# Patient Record
Sex: Female | Born: 1965
Health system: Southern US, Community
[De-identification: ages and names within clinical notes are randomized; demographics above are authoritative.]

## PROBLEM LIST (undated history)

## (undated) DIAGNOSIS — Q249 Congenital malformation of heart, unspecified: Secondary | ICD-10-CM

## (undated) DIAGNOSIS — M353 Polymyalgia rheumatica: Secondary | ICD-10-CM

## (undated) DIAGNOSIS — J189 Pneumonia, unspecified organism: Secondary | ICD-10-CM

## (undated) DIAGNOSIS — E669 Obesity, unspecified: Secondary | ICD-10-CM

## (undated) DIAGNOSIS — R7303 Prediabetes: Secondary | ICD-10-CM

## (undated) DIAGNOSIS — L0291 Cutaneous abscess, unspecified: Secondary | ICD-10-CM

## (undated) DIAGNOSIS — G90A Postural orthostatic tachycardia syndrome (POTS): Secondary | ICD-10-CM

## (undated) DIAGNOSIS — D839 Common variable immunodeficiency, unspecified: Secondary | ICD-10-CM

## (undated) DIAGNOSIS — R112 Nausea with vomiting, unspecified: Secondary | ICD-10-CM

## (undated) DIAGNOSIS — K219 Gastro-esophageal reflux disease without esophagitis: Secondary | ICD-10-CM

## (undated) DIAGNOSIS — M1990 Unspecified osteoarthritis, unspecified site: Secondary | ICD-10-CM

## (undated) DIAGNOSIS — I498 Other specified cardiac arrhythmias: Secondary | ICD-10-CM

## (undated) DIAGNOSIS — K5790 Diverticulosis of intestine, part unspecified, without perforation or abscess without bleeding: Secondary | ICD-10-CM

## (undated) DIAGNOSIS — Z9889 Other specified postprocedural states: Secondary | ICD-10-CM

## (undated) DIAGNOSIS — E7800 Pure hypercholesterolemia, unspecified: Secondary | ICD-10-CM

## (undated) DIAGNOSIS — G4730 Sleep apnea, unspecified: Secondary | ICD-10-CM

## (undated) DIAGNOSIS — I4891 Unspecified atrial fibrillation: Secondary | ICD-10-CM

## (undated) DIAGNOSIS — C8590 Non-Hodgkin lymphoma, unspecified, unspecified site: Secondary | ICD-10-CM

## (undated) DIAGNOSIS — R590 Localized enlarged lymph nodes: Secondary | ICD-10-CM

## (undated) DIAGNOSIS — J45909 Unspecified asthma, uncomplicated: Secondary | ICD-10-CM

## (undated) DIAGNOSIS — E119 Type 2 diabetes mellitus without complications: Secondary | ICD-10-CM

## (undated) DIAGNOSIS — I10 Essential (primary) hypertension: Secondary | ICD-10-CM

## (undated) DIAGNOSIS — R519 Headache, unspecified: Secondary | ICD-10-CM

## (undated) DIAGNOSIS — Z8632 Personal history of gestational diabetes: Secondary | ICD-10-CM

## (undated) DIAGNOSIS — N390 Urinary tract infection, site not specified: Secondary | ICD-10-CM

## (undated) HISTORY — DX: Cutaneous abscess, unspecified: L02.91

## (undated) HISTORY — PX: COLONOSCOPY: SHX174

## (undated) HISTORY — PX: SPLENECTOMY, TOTAL: SHX788

## (undated) HISTORY — DX: Congenital malformation of heart, unspecified: Q24.9

## (undated) HISTORY — DX: Non-Hodgkin lymphoma, unspecified, unspecified site: C85.90

## (undated) HISTORY — DX: Unspecified atrial fibrillation: I48.91

## (undated) HISTORY — PX: CHOLECYSTECTOMY: SHX55

## (undated) HISTORY — DX: Unspecified osteoarthritis, unspecified site: M19.90

## (undated) HISTORY — DX: Postural orthostatic tachycardia syndrome (POTS): G90.A

## (undated) HISTORY — DX: Urinary tract infection, site not specified: N39.0

## (undated) HISTORY — DX: Pneumonia, unspecified organism: J18.9

## (undated) HISTORY — PX: GALLBLADDER SURGERY: SHX652

## (undated) HISTORY — PX: JOINT REPLACEMENT: SHX530

## (undated) HISTORY — DX: Pure hypercholesterolemia, unspecified: E78.00

## (undated) HISTORY — DX: Diverticulosis of intestine, part unspecified, without perforation or abscess without bleeding: K57.90

## (undated) HISTORY — PX: APPENDECTOMY: SHX54

## (undated) HISTORY — PX: ABDOMINAL HYSTERECTOMY: SUR658

## (undated) HISTORY — DX: Essential (primary) hypertension: I10

## (undated) HISTORY — DX: Personal history of gestational diabetes: Z86.32

## (undated) HISTORY — PX: OTHER SURGICAL HISTORY: SHX169

## (undated) HISTORY — DX: Common variable immunodeficiency, unspecified: D83.9

## (undated) HISTORY — DX: Other specified cardiac arrhythmias: I49.8

## (undated) HISTORY — PX: TONSILLECTOMY AND ADENOIDECTOMY: SUR1326

## (undated) HISTORY — DX: Localized enlarged lymph nodes: R59.0

## (undated) HISTORY — DX: Obesity, unspecified: E66.9

## (undated) HISTORY — PX: LUNG REMOVAL, PARTIAL: SHX233

---
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
## 1996-09-29 DIAGNOSIS — C8590 Non-Hodgkin lymphoma, unspecified, unspecified site: Secondary | ICD-10-CM

## 1996-09-29 HISTORY — PX: SPLENECTOMY, TOTAL: SHX788

## 1996-09-29 HISTORY — DX: Non-Hodgkin lymphoma, unspecified, unspecified site: C85.90

## 2007-09-30 HISTORY — PX: LUNG REMOVAL, PARTIAL: SHX233

## 2011-01-12 DIAGNOSIS — N61 Mastitis without abscess: Secondary | ICD-10-CM | POA: Insufficient documentation

## 2012-07-13 DIAGNOSIS — Z9049 Acquired absence of other specified parts of digestive tract: Secondary | ICD-10-CM | POA: Insufficient documentation

## 2012-12-10 DIAGNOSIS — M5416 Radiculopathy, lumbar region: Secondary | ICD-10-CM | POA: Insufficient documentation

## 2012-12-10 DIAGNOSIS — M545 Low back pain, unspecified: Secondary | ICD-10-CM | POA: Insufficient documentation

## 2012-12-10 DIAGNOSIS — M79604 Pain in right leg: Secondary | ICD-10-CM | POA: Insufficient documentation

## 2012-12-21 DIAGNOSIS — Z8572 Personal history of non-Hodgkin lymphomas: Secondary | ICD-10-CM | POA: Insufficient documentation

## 2013-01-20 DIAGNOSIS — R7303 Prediabetes: Secondary | ICD-10-CM | POA: Insufficient documentation

## 2013-01-20 DIAGNOSIS — Z9081 Acquired absence of spleen: Secondary | ICD-10-CM | POA: Insufficient documentation

## 2013-03-30 DIAGNOSIS — R0982 Postnasal drip: Secondary | ICD-10-CM | POA: Insufficient documentation

## 2013-09-29 DIAGNOSIS — D839 Common variable immunodeficiency, unspecified: Secondary | ICD-10-CM

## 2013-09-29 HISTORY — DX: Common variable immunodeficiency, unspecified: D83.9

## 2015-05-01 DIAGNOSIS — J189 Pneumonia, unspecified organism: Secondary | ICD-10-CM | POA: Insufficient documentation

## 2015-05-07 DIAGNOSIS — D839 Common variable immunodeficiency, unspecified: Secondary | ICD-10-CM | POA: Insufficient documentation

## 2015-12-12 DIAGNOSIS — I10 Essential (primary) hypertension: Secondary | ICD-10-CM | POA: Insufficient documentation

## 2015-12-28 DIAGNOSIS — Z6841 Body Mass Index (BMI) 40.0 and over, adult: Secondary | ICD-10-CM | POA: Insufficient documentation

## 2016-03-14 DIAGNOSIS — Z90710 Acquired absence of both cervix and uterus: Secondary | ICD-10-CM | POA: Insufficient documentation

## 2017-01-12 ENCOUNTER — Encounter: Payer: Self-pay | Admitting: Internal Medicine

## 2017-01-13 DIAGNOSIS — M79675 Pain in left toe(s): Secondary | ICD-10-CM | POA: Diagnosis not present

## 2017-01-20 DIAGNOSIS — Z01419 Encounter for gynecological examination (general) (routine) without abnormal findings: Secondary | ICD-10-CM | POA: Diagnosis not present

## 2017-01-20 DIAGNOSIS — Z90722 Acquired absence of ovaries, bilateral: Secondary | ICD-10-CM | POA: Diagnosis not present

## 2017-01-20 DIAGNOSIS — Z90710 Acquired absence of both cervix and uterus: Secondary | ICD-10-CM | POA: Diagnosis not present

## 2017-01-20 DIAGNOSIS — Z1231 Encounter for screening mammogram for malignant neoplasm of breast: Secondary | ICD-10-CM | POA: Diagnosis not present

## 2017-01-20 DIAGNOSIS — Z9079 Acquired absence of other genital organ(s): Secondary | ICD-10-CM | POA: Diagnosis not present

## 2017-01-20 DIAGNOSIS — I10 Essential (primary) hypertension: Secondary | ICD-10-CM | POA: Diagnosis not present

## 2017-01-20 DIAGNOSIS — Z6841 Body Mass Index (BMI) 40.0 and over, adult: Secondary | ICD-10-CM | POA: Diagnosis not present

## 2017-01-23 DIAGNOSIS — D801 Nonfamilial hypogammaglobulinemia: Secondary | ICD-10-CM | POA: Diagnosis not present

## 2017-02-11 ENCOUNTER — Ambulatory Visit: Payer: Self-pay | Admitting: Internal Medicine

## 2017-02-19 DIAGNOSIS — D801 Nonfamilial hypogammaglobulinemia: Secondary | ICD-10-CM | POA: Diagnosis not present

## 2017-03-10 ENCOUNTER — Ambulatory Visit (INDEPENDENT_AMBULATORY_CARE_PROVIDER_SITE_OTHER): Payer: 59 | Admitting: Internal Medicine

## 2017-03-10 ENCOUNTER — Encounter (INDEPENDENT_AMBULATORY_CARE_PROVIDER_SITE_OTHER): Payer: Self-pay

## 2017-03-10 ENCOUNTER — Encounter: Payer: Self-pay | Admitting: Internal Medicine

## 2017-03-10 VITALS — BP 98/60 | HR 62 | Ht 64.0 in | Wt 284.0 lb

## 2017-03-10 DIAGNOSIS — K625 Hemorrhage of anus and rectum: Secondary | ICD-10-CM

## 2017-03-10 DIAGNOSIS — Z1211 Encounter for screening for malignant neoplasm of colon: Secondary | ICD-10-CM | POA: Diagnosis not present

## 2017-03-10 DIAGNOSIS — Z800 Family history of malignant neoplasm of digestive organs: Secondary | ICD-10-CM | POA: Diagnosis not present

## 2017-03-10 DIAGNOSIS — K219 Gastro-esophageal reflux disease without esophagitis: Secondary | ICD-10-CM | POA: Diagnosis not present

## 2017-03-10 NOTE — Patient Instructions (Signed)

## 2017-03-10 NOTE — Progress Notes (Signed)
HISTORY OF PRESENT ILLNESS:  Monique Clark is a 51 y.o. 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 Her past medical history is remarkable for non-Hodgkin's lymphoma, hypertension, hyperlipidemia, morbid obesity, and atrial fibrillation. 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 Reports to me history of perirectal abscess requiring fistulotomy. Also reports her father with a history of duodenal cancer. Patient does have intermittent problems with reflux disease which is managed with Pepcid. No dysphagia. She has had weight loss which is intentional. She also reports some intermittent minor rectal bleeding. She has not had prior colonoscopy or upper endoscopy. No family members with colon cancer. She inquires about alternative ways to evaluate GI tract such as capsule endoscopy. She is status post hysterectomy, appendectomy, cholecystectomy, and splenectomy  REVIEW OF SYSTEMS:  All non-GI ROS negative was otherwise stated in the history of present illness except for fatigue, sinus and allergy trouble  Past Medical History:  Diagnosis Date  . Abscess   . Arthritis   . Atrial fibrillation ([place])   . Cardiac arrhythmia due to congenital heart disease   . Diverticulosis   . Elevated cholesterol   . High blood pressure   . Non Hodgkin's lymphoma ([place])   . Obesity   . Pneumonia   . UTI (urinary tract infection)     Past Surgical History:  Procedure Laterality Date  . ABDOMINAL HYSTERECTOMY    . APPENDECTOMY    . CESAREAN SECTION    . GALLBLADDER SURGERY    . left shoulder repair    . LUNG REMOVAL, PARTIAL    . myringostomy Bilateral   . SPLENECTOMY, TOTAL    . TONSILLECTOMY AND ADENOIDECTOMY      Social History Monique Clark  reports that she quit smoking about 14 years ago. Her smoking use included Cigarettes. She smoked 0.50 packs per day. She has never used smokeless  tobacco. She reports that she does not drink alcohol or use drugs.  family history is not on file.  Allergies  Allergen Reactions  . Tetanus Toxoids     angioedema       PHYSICAL EXAMINATION: Vital signs: BP 98/60   Pulse 62   Ht 5\' 4"  (1.626 m)   Wt 284 lb (128.8 kg)   BMI 48.75 kg/m   Constitutional: Pleasant, generally well-appearing, no acute distress Psychiatric: alert and oriented x3, cooperative Eyes: extraocular movements intact, anicteric, conjunctiva pink Mouth: oral pharynx moist, no lesions Neck: supple without a medley Lymph: no lymphadenopathy Cardiovascular: heart regular rate and rhythm, no murmur Lungs: clear to auscultation bilaterally Abdomen: soft, obese, nontender, nondistended, no obvious ascites, no peritoneal signs, normal bowel sounds, no organomegaly Rectal: Deferred until colonoscopy Extremities: no clubbing cyanosis or lower extremity edema bilaterally Skin: no lesions on visible extremities Neuro: No focal deficits. No asterixis.    ASSESSMENT:  #1. Minor rectal bleeding. #2. History of rectal abscess requiring surgical attention. #3. Colon cancer screening. Appropriate candidate #4. Chronic GERD. #5. Family history of duodenal cancer #6. Morbid obesity #7. Gen. medical problems. Stable   PLAN:  #1. Colonoscopy to 5 colon cancer screening and evaluate rectal bleeding. Patient is high-risk given her body habitus.The nature of the procedure, as well as the risks, benefits, and alternatives were carefully and thoroughly reviewed with the patient. Ample time for discussion and questions allowed. The patient understood, was satisfied, and agreed to proceed. #2. Upper endoscopy to evaluate chronic GERD  symptoms and screen the upper GI tract given the family history of duodenal cancer. The patient is high-risk given her body habitus.The nature of the procedure, as well as the risks, benefits, and alternatives were carefully and thoroughly  reviewed with the patient. Ample time for discussion and questions allowed. The patient understood, was satisfied, and agreed to proceed. #3. Reflux precautions #4. Weight loss.

## 2017-03-13 DIAGNOSIS — L304 Erythema intertrigo: Secondary | ICD-10-CM | POA: Diagnosis not present

## 2017-03-13 DIAGNOSIS — Z79899 Other long term (current) drug therapy: Secondary | ICD-10-CM | POA: Diagnosis not present

## 2017-03-19 ENCOUNTER — Telehealth: Payer: Self-pay | Admitting: Internal Medicine

## 2017-03-20 DIAGNOSIS — E785 Hyperlipidemia, unspecified: Secondary | ICD-10-CM | POA: Diagnosis not present

## 2017-03-20 DIAGNOSIS — R21 Rash and other nonspecific skin eruption: Secondary | ICD-10-CM | POA: Diagnosis not present

## 2017-03-20 MED ORDER — NA SULFATE-K SULFATE-MG SULF 17.5-3.13-1.6 GM/177ML PO SOLN: 1.0000 | Freq: Once | ORAL | 0 refills | Status: AC

## 2017-03-20 NOTE — Telephone Encounter (Signed)
Sent Suprep to pharmacy 

## 2017-03-26 DIAGNOSIS — D801 Nonfamilial hypogammaglobulinemia: Secondary | ICD-10-CM | POA: Diagnosis not present

## 2017-04-20 DIAGNOSIS — K601 Chronic anal fissure: Secondary | ICD-10-CM | POA: Diagnosis not present

## 2017-04-24 DIAGNOSIS — H5213 Myopia, bilateral: Secondary | ICD-10-CM | POA: Diagnosis not present

## 2017-04-27 DIAGNOSIS — D801 Nonfamilial hypogammaglobulinemia: Secondary | ICD-10-CM | POA: Diagnosis not present

## 2017-05-11 ENCOUNTER — Telehealth: Payer: Self-pay | Admitting: Internal Medicine

## 2017-05-11 MED FILL — CARVEDILOL 25 MG TABLET: 25 | 30 days supply | Qty: 60 | Fill #0

## 2017-05-11 MED FILL — HYDROCHLOROTHIAZIDE 25 MG T: 25 | 90 days supply | Qty: 90 | Fill #0

## 2017-05-11 NOTE — Telephone Encounter (Signed)
Called patient and relayed to her that Dr. Henrene Pastor said to proceed with procedure and for her to continue antibiotics as prescribed.  Pt. Aware and all questions answered.

## 2017-05-11 NOTE — Telephone Encounter (Signed)
Okay to proceed. Continue on antibiotic as prescribed

## 2017-05-11 NOTE — Telephone Encounter (Signed)
Returned patient's call and she states that she has developed a fissure and rectal abscess within the past week.  She saw the surgeon that performed her surgery for this in the past.  He placed her on cipro.  Patient called to inquire if you would still want to continue with her colonoscopy tomorrow. Dr. Henrene Pastor,  please advise.  Thank you. [place]

## 2017-05-12 ENCOUNTER — Ambulatory Visit (AMBULATORY_SURGERY_CENTER): Payer: 59 | Admitting: Internal Medicine

## 2017-05-12 ENCOUNTER — Encounter: Payer: Self-pay | Admitting: Internal Medicine

## 2017-05-12 VITALS — BP 176/80 | HR 61 | Temp 98.0°F | Resp 14 | Ht 64.0 in | Wt 284.0 lb

## 2017-05-12 DIAGNOSIS — K625 Hemorrhage of anus and rectum: Secondary | ICD-10-CM | POA: Diagnosis not present

## 2017-05-12 DIAGNOSIS — Z1212 Encounter for screening for malignant neoplasm of rectum: Secondary | ICD-10-CM | POA: Diagnosis not present

## 2017-05-12 DIAGNOSIS — Z800 Family history of malignant neoplasm of digestive organs: Secondary | ICD-10-CM

## 2017-05-12 DIAGNOSIS — K219 Gastro-esophageal reflux disease without esophagitis: Secondary | ICD-10-CM

## 2017-05-12 DIAGNOSIS — D120 Benign neoplasm of cecum: Secondary | ICD-10-CM

## 2017-05-12 DIAGNOSIS — Z1211 Encounter for screening for malignant neoplasm of colon: Secondary | ICD-10-CM | POA: Diagnosis not present

## 2017-05-12 DIAGNOSIS — K635 Polyp of colon: Secondary | ICD-10-CM | POA: Diagnosis not present

## 2017-05-12 MED ORDER — SODIUM CHLORIDE 0.9 % IV SOLN: 500.0000 mL | INTRAVENOUS | Status: DC

## 2017-05-12 NOTE — Progress Notes (Signed)
Pt's IV infiltrated upon induction, with extravasation of approximately 5cc Propofol.  Immediately recognized, Dr Henrene Pastor notified, and IV removed. Mild erythma noted.  New #22 placed in R hand.  Spontaneous respirations throughout. VSS. Resting comfortably. To PACU on room air. Plan for cool packs to site of infiltration. Report to  RN.

## 2017-05-12 NOTE — Op Note (Signed)
[facility] Patient Name: Monique Clark Procedure Date: 05/12/2017 2:47 PM MRN: [id number] Endoscopist: Docia Chuck. Henrene Pastor , MD Age: 51 Referring MD:  Date of Birth: 15-Sep-1966 Gender: Female Account #: [id number] Procedure:                Upper GI endoscopy Indications:              Esophageal reflux. NOTE: Family history of duodenal                            cancer Medicines:                Monitored Anesthesia Care Procedure:                Pre-Anesthesia Assessment:                           - Prior to the procedure, a History and Physical                            was performed, and patient medications and                            allergies were reviewed. The patient's tolerance of                            previous anesthesia was also reviewed. The risks                            and benefits of the procedure and the sedation                            options and risks were discussed with the patient.                            All questions were answered, and informed consent                            was obtained. Prior Anticoagulants: The patient has                            taken no previous anticoagulant or antiplatelet                            agents. ASA Grade Assessment: II - A patient with                            mild systemic disease. After reviewing the risks                            and benefits, the patient was deemed in                            satisfactory condition to undergo the procedure.  After obtaining informed consent, the endoscope was                            passed under direct vision. Throughout the                            procedure, the patient's blood pressure, pulse, and                            oxygen saturations were monitored continuously. The                            Endoscope was introduced through the mouth, and                            advanced to the third part of duodenum. The  upper                            GI endoscopy was accomplished without difficulty.                            The patient tolerated the procedure well. Scope In: Scope Out: Findings:                 The esophagus was normal.                           The stomach was normal.                           The examined duodenum was normal.                           The cardia and gastric fundus were normal on                            retroflexion. Complications:            No immediate complications. Estimated Blood Loss:     Estimated blood loss: none. Impression:               - Normal esophagus.                           - Normal stomach.                           - Normal examined duodenum.                           - No specimens collected. Recommendation:           - Patient has a contact number available for                            emergencies. The signs and symptoms of potential  delayed complications were discussed with the                            patient. Return to normal activities tomorrow.                            Written discharge instructions were provided to the                            patient.                           - Resume previous diet.                           - Continue present medications. Docia Chuck. Henrene Pastor, MD 05/12/2017 3:29:25 PM This report has been signed electronically.

## 2017-05-12 NOTE — Op Note (Signed)
[facility] Patient Name: Monique Clark Procedure Date: 05/12/2017 2:48 PM MRN: [id number] Endoscopist: Docia Chuck. Henrene Pastor , MD Age: 51 Referring MD:  Date of Birth: Jun 26, 1966 Gender: Female Account #: [id number] Procedure:                Colonoscopy, with cold snare polypectomy x 1 Indications:              Screening for colorectal malignant neoplasm Medicines:                Monitored Anesthesia Care Procedure:                Pre-Anesthesia Assessment:                           - Prior to the procedure, a History and Physical                            was performed, and patient medications and                            allergies were reviewed. The patient's tolerance of                            previous anesthesia was also reviewed. The risks                            and benefits of the procedure and the sedation                            options and risks were discussed with the patient.                            All questions were answered, and informed consent                            was obtained. Prior Anticoagulants: The patient has                            taken no previous anticoagulant or antiplatelet                            agents. ASA Grade Assessment: II - A patient with                            mild systemic disease. After reviewing the risks                            and benefits, the patient was deemed in                            satisfactory condition to undergo the procedure.                           After obtaining informed consent, the colonoscope  was passed under direct vision. Throughout the                            procedure, the patient's blood pressure, pulse, and                            oxygen saturations were monitored continuously. The                            Model CF-HQ190L [id number]) scope was introduced                            through the anus and advanced to the the cecum,                   identified by appendiceal orifice and ileocecal                            valve. The ileocecal valve, appendiceal orifice,                            and rectum were photographed. The quality of the                            bowel preparation was excellent. The colonoscopy                            was performed without difficulty. The patient                            tolerated the procedure well. The bowel preparation                            used was SUPREP. Scope In: 3:05:25 PM Scope Out: 3:19:26 PM Scope Withdrawal Time: 0 hours 10 minutes 27 seconds  Total Procedure Duration: 0 hours 14 minutes 1 second  Findings:                 A 3 mm polyp was found in the cecum. The polyp was                            sessile. The polyp was removed with a cold snare.                            Resection and retrieval were complete.                           A few small-mouthed diverticula were found in the                            sigmoid colon.                           Internal hemorrhoids were found during retroflexion.  The exam was otherwise without abnormality on                            direct and retroflexion views. Complications:            No immediate complications. Estimated blood loss:                            None. Estimated Blood Loss:     Estimated blood loss: none. Impression:               - One 3 mm polyp in the cecum, removed with a cold                            snare. Resected and retrieved.                           - Diverticulosis in the sigmoid colon.                           - Internal hemorrhoids.                           - The examination was otherwise normal on direct                            and retroflexion views. Recommendation:           - Repeat colonoscopy in 5-10 years for                            surveillance, based on pathology results.                           - Patient has a contact number  available for                            emergencies. The signs and symptoms of potential                            delayed complications were discussed with the                            patient. Return to normal activities tomorrow.                            Written discharge instructions were provided to the                            patient.                           - Resume previous diet.                           - Continue present medications.                           -  Await pathology results. Docia Chuck. Henrene Pastor, MD 05/12/2017 3:27:41 PM This report has been signed electronically.

## 2017-05-12 NOTE — Progress Notes (Signed)
Called to room to assist during endoscopic procedure.  Patient ID and intended procedure confirmed with present staff. Received instructions for my participation in the procedure from the performing physician.  

## 2017-05-12 NOTE — Patient Instructions (Signed)
YOU HAD AN ENDOSCOPIC PROCEDURE TODAY AT [facility]:   Refer to the procedure report that was given to you for any specific questions about what was found during the examination.  If the procedure report does not answer your questions, please call your gastroenterologist to clarify.  If you requested that your care partner not be given the details of your procedure findings, then the procedure report has been included in a sealed envelope for you to review at your convenience later.  YOU SHOULD EXPECT: Some feelings of bloating in the abdomen. Passage of more gas than usual.  Walking can help get rid of the air that was put into your GI tract during the procedure and reduce the bloating. If you had a lower endoscopy (such as a colonoscopy or flexible sigmoidoscopy) you may notice spotting of blood in your stool or on the toilet paper. If you underwent a bowel prep for your procedure, you may not have a normal bowel movement for a few days.  Please Note:  You might notice some irritation and congestion in your nose or some drainage.  This is from the oxygen used during your procedure.  There is no need for concern and it should clear up in a day or so.  SYMPTOMS TO REPORT IMMEDIATELY:   Following lower endoscopy (colonoscopy or flexible sigmoidoscopy):  Excessive amounts of blood in the stool  Significant tenderness or worsening of abdominal pains  Swelling of the abdomen that is new, acute  Fever of 100F or higher   Following upper endoscopy (EGD)  Vomiting of blood or coffee ground material  New chest pain or pain under the shoulder blades  Painful or persistently difficult swallowing  New shortness of breath  Fever of 100F or higher  Black, tarry-looking stools  For urgent or emergent issues, a gastroenterologist can be reached at any hour by calling [phone].   DIET:  We do recommend a small meal at first, but then you may proceed to your regular diet.  Drink  plenty of fluids but you should avoid alcoholic beverages for 24 hours.  ACTIVITY:  You should plan to take it easy for the rest of today and you should NOT DRIVE or use heavy machinery until tomorrow (because of the sedation medicines used during the test).    FOLLOW UP: Our staff will call the number listed on your records the next business day following your procedure to check on you and address any questions or concerns that you may have regarding the information given to you following your procedure. If we do not reach you, we will leave a message.  However, if you are feeling well and you are not experiencing any problems, there is no need to return our call.  We will assume that you have returned to your regular daily activities without incident.  If any biopsies were taken you will be contacted by phone or by letter within the next 1-3 weeks.  Please call us at [phone] if you have not heard about the biopsies in 3 weeks.    SIGNATURES/CONFIDENTIALITY: You and/or your care partner have signed paperwork which will be entered into your electronic medical record.  These signatures attest to the fact that that the information above on your After Visit Summary has been reviewed and is understood.  Full responsibility of the confidentiality of this discharge information lies with you and/or your care-partner.   Resume medications. Information given on polyps,diverticulosis and hemorrhoids.

## 2017-05-13 ENCOUNTER — Telehealth: Payer: Self-pay | Admitting: *Deleted

## 2017-05-13 ENCOUNTER — Telehealth: Payer: Self-pay

## 2017-05-13 NOTE — Telephone Encounter (Signed)
Second attempt follow up call,  Wrong number on chart.

## 2017-05-13 NOTE — Telephone Encounter (Signed)
No answer, left message to call if questions or concerns. 

## 2017-05-19 ENCOUNTER — Encounter: Payer: Self-pay | Admitting: Internal Medicine

## 2017-05-20 DIAGNOSIS — E785 Hyperlipidemia, unspecified: Secondary | ICD-10-CM | POA: Diagnosis not present

## 2017-05-20 DIAGNOSIS — M2550 Pain in unspecified joint: Secondary | ICD-10-CM | POA: Diagnosis not present

## 2017-05-20 DIAGNOSIS — M791 Myalgia: Secondary | ICD-10-CM | POA: Diagnosis not present

## 2017-05-20 DIAGNOSIS — F5104 Psychophysiologic insomnia: Secondary | ICD-10-CM | POA: Diagnosis not present

## 2017-05-31 DIAGNOSIS — D801 Nonfamilial hypogammaglobulinemia: Secondary | ICD-10-CM | POA: Diagnosis not present

## 2017-06-02 MED FILL — MELOXICAM 15 MG TABLET: 15 | 90 days supply | Qty: 90 | Fill #0

## 2017-06-02 MED FILL — tiZANidine HCL 2 MG TABS: 2 | 90 days supply | Qty: 90 | Fill #0

## 2017-06-04 DIAGNOSIS — M791 Myalgia: Secondary | ICD-10-CM | POA: Diagnosis not present

## 2017-06-04 DIAGNOSIS — Z7982 Long term (current) use of aspirin: Secondary | ICD-10-CM | POA: Diagnosis not present

## 2017-06-04 DIAGNOSIS — D801 Nonfamilial hypogammaglobulinemia: Secondary | ICD-10-CM | POA: Diagnosis not present

## 2017-06-04 DIAGNOSIS — I519 Heart disease, unspecified: Secondary | ICD-10-CM | POA: Diagnosis not present

## 2017-06-04 DIAGNOSIS — Z79899 Other long term (current) drug therapy: Secondary | ICD-10-CM | POA: Diagnosis not present

## 2017-06-04 DIAGNOSIS — D72829 Elevated white blood cell count, unspecified: Secondary | ICD-10-CM | POA: Diagnosis not present

## 2017-06-04 DIAGNOSIS — R5383 Other fatigue: Secondary | ICD-10-CM | POA: Diagnosis not present

## 2017-06-04 DIAGNOSIS — Z6841 Body Mass Index (BMI) 40.0 and over, adult: Secondary | ICD-10-CM | POA: Diagnosis not present

## 2017-06-04 DIAGNOSIS — Z7901 Long term (current) use of anticoagulants: Secondary | ICD-10-CM | POA: Diagnosis not present

## 2017-06-08 DIAGNOSIS — R5383 Other fatigue: Secondary | ICD-10-CM | POA: Diagnosis not present

## 2017-06-08 DIAGNOSIS — D801 Nonfamilial hypogammaglobulinemia: Secondary | ICD-10-CM | POA: Diagnosis not present

## 2017-06-08 DIAGNOSIS — Z79899 Other long term (current) drug therapy: Secondary | ICD-10-CM | POA: Diagnosis not present

## 2017-06-08 DIAGNOSIS — D72829 Elevated white blood cell count, unspecified: Secondary | ICD-10-CM | POA: Diagnosis not present

## 2017-06-08 DIAGNOSIS — M791 Myalgia: Secondary | ICD-10-CM | POA: Diagnosis not present

## 2017-06-08 DIAGNOSIS — Z7982 Long term (current) use of aspirin: Secondary | ICD-10-CM | POA: Diagnosis not present

## 2017-06-08 DIAGNOSIS — I519 Heart disease, unspecified: Secondary | ICD-10-CM | POA: Diagnosis not present

## 2017-06-08 DIAGNOSIS — Z6841 Body Mass Index (BMI) 40.0 and over, adult: Secondary | ICD-10-CM | POA: Diagnosis not present

## 2017-06-08 DIAGNOSIS — Z7901 Long term (current) use of anticoagulants: Secondary | ICD-10-CM | POA: Diagnosis not present

## 2017-06-09 DIAGNOSIS — J329 Chronic sinusitis, unspecified: Secondary | ICD-10-CM | POA: Diagnosis not present

## 2017-06-22 DIAGNOSIS — Z8572 Personal history of non-Hodgkin lymphomas: Secondary | ICD-10-CM | POA: Diagnosis not present

## 2017-06-22 DIAGNOSIS — L0390 Cellulitis, unspecified: Secondary | ICD-10-CM | POA: Insufficient documentation

## 2017-06-22 DIAGNOSIS — I4891 Unspecified atrial fibrillation: Secondary | ICD-10-CM | POA: Diagnosis not present

## 2017-06-22 DIAGNOSIS — Z9081 Acquired absence of spleen: Secondary | ICD-10-CM | POA: Diagnosis not present

## 2017-06-22 DIAGNOSIS — I340 Nonrheumatic mitral (valve) insufficiency: Secondary | ICD-10-CM | POA: Insufficient documentation

## 2017-06-22 DIAGNOSIS — I10 Essential (primary) hypertension: Secondary | ICD-10-CM | POA: Diagnosis not present

## 2017-06-22 MED FILL — BYSTOLIC 5 MG TABLET: 5 | 90 days supply | Qty: 90 | Fill #0

## 2017-07-01 DIAGNOSIS — I348 Other nonrheumatic mitral valve disorders: Secondary | ICD-10-CM | POA: Diagnosis not present

## 2017-07-01 DIAGNOSIS — I4891 Unspecified atrial fibrillation: Secondary | ICD-10-CM | POA: Diagnosis not present

## 2017-07-01 DIAGNOSIS — D801 Nonfamilial hypogammaglobulinemia: Secondary | ICD-10-CM | POA: Diagnosis not present

## 2017-07-08 DIAGNOSIS — D801 Nonfamilial hypogammaglobulinemia: Secondary | ICD-10-CM | POA: Diagnosis not present

## 2017-07-15 DIAGNOSIS — D801 Nonfamilial hypogammaglobulinemia: Secondary | ICD-10-CM | POA: Diagnosis not present

## 2017-07-17 DIAGNOSIS — Z6841 Body Mass Index (BMI) 40.0 and over, adult: Secondary | ICD-10-CM | POA: Diagnosis not present

## 2017-07-17 DIAGNOSIS — N898 Other specified noninflammatory disorders of vagina: Secondary | ICD-10-CM | POA: Diagnosis not present

## 2017-07-17 DIAGNOSIS — N644 Mastodynia: Secondary | ICD-10-CM | POA: Diagnosis not present

## 2017-07-17 DIAGNOSIS — R1084 Generalized abdominal pain: Secondary | ICD-10-CM | POA: Diagnosis not present

## 2017-07-21 MED FILL — VIT D2 1.25 MG (50,000 UNIT: 1.25 MG | 84 days supply | Qty: 12 | Fill #0

## 2017-07-22 DIAGNOSIS — D801 Nonfamilial hypogammaglobulinemia: Secondary | ICD-10-CM | POA: Diagnosis not present

## 2017-07-28 DIAGNOSIS — M2550 Pain in unspecified joint: Secondary | ICD-10-CM | POA: Diagnosis not present

## 2017-07-28 DIAGNOSIS — M791 Myalgia, unspecified site: Secondary | ICD-10-CM | POA: Diagnosis not present

## 2017-07-28 DIAGNOSIS — Z6841 Body Mass Index (BMI) 40.0 and over, adult: Secondary | ICD-10-CM | POA: Diagnosis not present

## 2017-07-28 MED FILL — CELECOXIB 200 MG CAPS: 200 | 30 days supply | Qty: 60 | Fill #0

## 2017-07-31 DIAGNOSIS — N644 Mastodynia: Secondary | ICD-10-CM | POA: Diagnosis not present

## 2017-07-31 DIAGNOSIS — R1032 Left lower quadrant pain: Secondary | ICD-10-CM | POA: Diagnosis not present

## 2017-07-31 DIAGNOSIS — R102 Pelvic and perineal pain: Secondary | ICD-10-CM | POA: Diagnosis not present

## 2017-08-05 DIAGNOSIS — D801 Nonfamilial hypogammaglobulinemia: Secondary | ICD-10-CM | POA: Diagnosis not present

## 2017-08-10 DIAGNOSIS — M25552 Pain in left hip: Secondary | ICD-10-CM | POA: Diagnosis not present

## 2017-08-10 DIAGNOSIS — G8929 Other chronic pain: Secondary | ICD-10-CM | POA: Diagnosis not present

## 2017-08-10 DIAGNOSIS — M545 Low back pain: Secondary | ICD-10-CM | POA: Diagnosis not present

## 2017-08-10 DIAGNOSIS — M1612 Unilateral primary osteoarthritis, left hip: Secondary | ICD-10-CM | POA: Diagnosis not present

## 2017-08-10 DIAGNOSIS — M47816 Spondylosis without myelopathy or radiculopathy, lumbar region: Secondary | ICD-10-CM | POA: Diagnosis not present

## 2017-08-10 DIAGNOSIS — M797 Fibromyalgia: Secondary | ICD-10-CM | POA: Diagnosis not present

## 2017-08-10 DIAGNOSIS — M5136 Other intervertebral disc degeneration, lumbar region: Secondary | ICD-10-CM | POA: Diagnosis not present

## 2017-08-10 DIAGNOSIS — M2550 Pain in unspecified joint: Secondary | ICD-10-CM | POA: Diagnosis not present

## 2017-08-10 DIAGNOSIS — M5416 Radiculopathy, lumbar region: Secondary | ICD-10-CM | POA: Diagnosis not present

## 2017-08-14 DIAGNOSIS — D839 Common variable immunodeficiency, unspecified: Secondary | ICD-10-CM | POA: Diagnosis not present

## 2017-08-14 DIAGNOSIS — M545 Low back pain: Secondary | ICD-10-CM | POA: Diagnosis not present

## 2017-08-14 DIAGNOSIS — F5104 Psychophysiologic insomnia: Secondary | ICD-10-CM | POA: Diagnosis not present

## 2017-08-14 DIAGNOSIS — M5416 Radiculopathy, lumbar region: Secondary | ICD-10-CM | POA: Diagnosis not present

## 2017-08-14 DIAGNOSIS — G894 Chronic pain syndrome: Secondary | ICD-10-CM | POA: Diagnosis not present

## 2017-08-19 ENCOUNTER — Ambulatory Visit (HOSPITAL_COMMUNITY)
Admission: EM | Admit: 2017-08-19 | Discharge: 2017-08-19 | Disposition: A | Discharge status: Home / Self Care | Payer: 59 | Attending: Family Medicine | Admitting: Family Medicine

## 2017-08-19 ENCOUNTER — Encounter (HOSPITAL_COMMUNITY): Payer: Self-pay | Admitting: Emergency Medicine

## 2017-08-19 ENCOUNTER — Ambulatory Visit (HOSPITAL_COMMUNITY): Payer: 59

## 2017-08-19 ENCOUNTER — Other Ambulatory Visit: Payer: Self-pay

## 2017-08-19 DIAGNOSIS — M79671 Pain in right foot: Secondary | ICD-10-CM | POA: Diagnosis not present

## 2017-08-19 MED ORDER — TRAMADOL HCL 50 MG PO TABS: 50.0000 mg | ORAL_TABLET | Freq: Four times a day (QID) | ORAL | 0 refills | Status: DC | PRN

## 2017-08-19 NOTE — ED Provider Notes (Signed)
[place]    CSN: [id number] Arrival date & time: 08/19/17  1938     History   Chief Complaint Chief Complaint  Patient presents with  . Foot Pain    HPI Monique Clark is a 51 y.o. female.   51 year old female comes in with 3-4 day history of right foot pain. No injury/trauma. States joints are stiff first thing in the morning. Has been on Celebrex and prednisone for back problems with little relief. Work requires long hours of walking and standing. Denies numbness/tingling.       Past Medical History:  Diagnosis Date  . Abscess   . Arthritis   . Atrial fibrillation ([place])   . Cardiac arrhythmia due to congenital heart disease   . CVID (common variable immunodeficiency) ([place]) 2015  . Diverticulosis   . Elevated cholesterol   . High blood pressure   . Non Hodgkin's lymphoma ([place])   . Obesity   . Pneumonia   . UTI (urinary tract infection)     There are no active problems to display for this patient.   Past Surgical History:  Procedure Laterality Date  . ABDOMINAL HYSTERECTOMY    . APPENDECTOMY    . CESAREAN SECTION    . GALLBLADDER SURGERY    . left shoulder repair    . LUNG REMOVAL, PARTIAL    . myringostomy Bilateral   . SPLENECTOMY, TOTAL    . TONSILLECTOMY AND ADENOIDECTOMY      OB History    No data available       Home Medications    Prior to Admission medications   Medication Sig Start Date End Date Taking? Authorizing Provider  aspirin 325 MG tablet Take by mouth daily. 07/25/14   [provider]  carvedilol (COREG) 25 MG tablet TAKE 1 TABLET BY MOUTH TWICE A DAY FOR BLOOD PRESSURE 01/20/17   [provider]  famotidine (PEPCID) 20 MG tablet Take by mouth. 07/25/14   [provider]  Ferrous Gluconate-C-Folic Acid (IRON-C PO) Take by mouth daily.    [provider]  human chorionic gonadotropin (PREGNYL/NOVAREL) 10000 units injection Inject 10,000 Units into the muscle once.    [provider]  hydrochlorothiazide (HYDRODIURIL) 25 MG tablet Take by mouth.    [provider]  Immune Globulin, Human, (HIZENTRA) 4 GM/20ML SOLN Inject into the skin. 05/03/15   [provider]  meloxicam (MOBIC) 15 MG tablet Take by mouth. 01/29/17   [provider]  Multiple Vitamins-Minerals (MULTIVITAMIN PO) Take by mouth.    [provider]  phentermine (ADIPEX-P) 37.5 MG tablet Take 37.5 mg by mouth daily. 02/04/17   [provider]  tiZANidine (ZANAFLEX) 2 MG tablet Take by mouth daily as needed. 03/04/17   [provider]  traMADol (ULTRAM) 50 MG tablet Take 1 tablet (50 mg total) by mouth every 6 (six) hours as needed. 08/19/17   Tasia Catchings, Abbiegail Landgren V, PA-C  Vitamin D, Ergocalciferol, (DRISDOL) 50000 units CAPS capsule Take by mouth. 01/29/17   [provider]  vitamin E 400 UNIT capsule Take by mouth.    [provider]    Family History No family history on file.  Social History Social History   Tobacco Use  . Smoking status: Former Smoker    Packs/day: 0.50    Types: Cigarettes    Last attempt to quit: 09/29/2002    Years since quitting: 14.8  . Smokeless tobacco: Never Used  Substance Use Topics  .  Alcohol use: No  . Drug use: No     Allergies   Tetanus toxoids   Review of Systems Review of Systems  Reason unable to perform ROS: See HPI as above.     Physical Exam Triage Vital Signs ED Triage Vitals [08/19/17 1945]  Enc Vitals Group     BP (!) 137/95     Pulse Rate 62     Resp 18     Temp 98.2 F (36.8 C)     Temp src      SpO2 100 %     Weight      Height      Head Circumference      Peak Flow      Pain Score 3     Pain Loc      Pain Edu?      Excl. in [place]?    No data found.  Updated Vital Signs BP (!) 137/95   Pulse 62   Temp 98.2 F (36.8 C)   Resp 18   SpO2 100%   Physical Exam  Constitutional: She is oriented to person, place, and time. She appears well-developed and  well-nourished. No distress.  HENT:  Head: Normocephalic and atraumatic.  Eyes: Conjunctivae are normal. Pupils are equal, round, and reactive to light.  Musculoskeletal:  No swelling, erythema, increased warmth seen. Diffuse tenderness on palpation along dorsal and plantar aspect of foot. Dorsiflexion of the toes exacerbated symptoms. Full ROM of ankle and toes. Strength normal and equal bilaterally. Sensation intact. Pedal pulses 2+. Cap refill <2s  Neurological: She is alert and oriented to person, place, and time.     UC Treatments / Results  Labs (all labs ordered are listed, but only abnormal results are displayed) Labs Reviewed - No data to display  EKG  EKG Interpretation None       Radiology No results found.  Procedures Procedures (including critical care time)  Medications Ordered in UC Medications - No data to display   Initial Impression / Assessment and Plan / UC Course  I have reviewed the triage vital signs and the nursing notes.  Pertinent labs & imaging results that were available during my care of the patient were reviewed by me and considered in my medical decision making (see chart for details).    Discussed possible plantar fasciitis/tendinitis causing symptoms. As patient already on celebrex and prednisone, will prescribe short course tramadol for breakthrough pain. Discussed exercises/ice compress, supportive shoes. To follow up with PCP/orthopedics if symptoms does not improve/worsens.   Final Clinical Impressions(s) / UC Diagnoses   Final diagnoses:  Right foot pain    ED Discharge Orders        Ordered    traMADol (ULTRAM) 50 MG tablet  Every 6 hours PRN     08/19/17 2006       Controlled Substance Prescriptions [place] Controlled Substance Registry consulted? Yes, I have consulted the [place] Controlled Substances Registry for this patient, and feel the risk/benefit ratio today is favorable for proceeding with this prescription for a controlled  substance.   Ok Edwards, PA-C 08/19/17 2021

## 2017-08-19 NOTE — ED Triage Notes (Signed)
Pt c/o R foot pain for a few days. Denies injury. Walks a lot at work.

## 2017-08-19 NOTE — Discharge Instructions (Addendum)
Symptoms most consistent with plantar fasciitis. Continue celebrex, finish prednisone. Tramadol for breakthrough pain. Ice compress, elevation. May need better arch support. Follow up with PCP/orthopedics if symptoms worsens/does not improve/reoccurs.

## 2017-08-25 MED FILL — CELECOXIB 200 MG CAPS: 200 | 30 days supply | Qty: 60 | Fill #1

## 2017-08-25 MED FILL — HYDROCHLOROTHIAZIDE 25 MG T: 25 | 90 days supply | Qty: 90 | Fill #1

## 2017-09-07 DIAGNOSIS — D801 Nonfamilial hypogammaglobulinemia: Secondary | ICD-10-CM | POA: Diagnosis not present

## 2017-09-08 DIAGNOSIS — M25552 Pain in left hip: Secondary | ICD-10-CM | POA: Diagnosis not present

## 2017-09-08 DIAGNOSIS — M25571 Pain in right ankle and joints of right foot: Secondary | ICD-10-CM | POA: Diagnosis not present

## 2017-09-14 DIAGNOSIS — M62838 Other muscle spasm: Secondary | ICD-10-CM | POA: Diagnosis not present

## 2017-09-14 DIAGNOSIS — M25552 Pain in left hip: Secondary | ICD-10-CM | POA: Diagnosis not present

## 2017-09-14 DIAGNOSIS — M62552 Muscle wasting and atrophy, not elsewhere classified, left thigh: Secondary | ICD-10-CM | POA: Diagnosis not present

## 2017-09-14 DIAGNOSIS — M1612 Unilateral primary osteoarthritis, left hip: Secondary | ICD-10-CM | POA: Diagnosis not present

## 2017-09-14 DIAGNOSIS — M25652 Stiffness of left hip, not elsewhere classified: Secondary | ICD-10-CM | POA: Diagnosis not present

## 2017-09-15 DIAGNOSIS — M62838 Other muscle spasm: Secondary | ICD-10-CM | POA: Diagnosis not present

## 2017-09-15 DIAGNOSIS — M62552 Muscle wasting and atrophy, not elsewhere classified, left thigh: Secondary | ICD-10-CM | POA: Diagnosis not present

## 2017-09-15 DIAGNOSIS — M25552 Pain in left hip: Secondary | ICD-10-CM | POA: Diagnosis not present

## 2017-09-15 DIAGNOSIS — M25652 Stiffness of left hip, not elsewhere classified: Secondary | ICD-10-CM | POA: Diagnosis not present

## 2017-09-15 DIAGNOSIS — M1612 Unilateral primary osteoarthritis, left hip: Secondary | ICD-10-CM | POA: Diagnosis not present

## 2017-09-24 DIAGNOSIS — M25552 Pain in left hip: Secondary | ICD-10-CM | POA: Diagnosis not present

## 2017-09-24 DIAGNOSIS — M5136 Other intervertebral disc degeneration, lumbar region: Secondary | ICD-10-CM | POA: Diagnosis not present

## 2017-09-24 DIAGNOSIS — M1612 Unilateral primary osteoarthritis, left hip: Secondary | ICD-10-CM | POA: Diagnosis not present

## 2017-09-24 DIAGNOSIS — M7062 Trochanteric bursitis, left hip: Secondary | ICD-10-CM | POA: Diagnosis not present

## 2017-09-24 DIAGNOSIS — M5416 Radiculopathy, lumbar region: Secondary | ICD-10-CM | POA: Diagnosis not present

## 2017-09-24 DIAGNOSIS — M545 Low back pain: Secondary | ICD-10-CM | POA: Diagnosis not present

## 2017-09-24 DIAGNOSIS — G8929 Other chronic pain: Secondary | ICD-10-CM | POA: Diagnosis not present

## 2017-09-24 DIAGNOSIS — M47816 Spondylosis without myelopathy or radiculopathy, lumbar region: Secondary | ICD-10-CM | POA: Diagnosis not present

## 2017-10-01 DIAGNOSIS — M25652 Stiffness of left hip, not elsewhere classified: Secondary | ICD-10-CM | POA: Diagnosis not present

## 2017-10-01 DIAGNOSIS — M62552 Muscle wasting and atrophy, not elsewhere classified, left thigh: Secondary | ICD-10-CM | POA: Diagnosis not present

## 2017-10-01 DIAGNOSIS — M1612 Unilateral primary osteoarthritis, left hip: Secondary | ICD-10-CM | POA: Diagnosis not present

## 2017-10-01 DIAGNOSIS — M25552 Pain in left hip: Secondary | ICD-10-CM | POA: Diagnosis not present

## 2017-10-01 DIAGNOSIS — M62838 Other muscle spasm: Secondary | ICD-10-CM | POA: Diagnosis not present

## 2017-10-02 DIAGNOSIS — M1612 Unilateral primary osteoarthritis, left hip: Secondary | ICD-10-CM | POA: Diagnosis not present

## 2017-10-02 DIAGNOSIS — M25552 Pain in left hip: Secondary | ICD-10-CM | POA: Diagnosis not present

## 2017-10-02 DIAGNOSIS — M62552 Muscle wasting and atrophy, not elsewhere classified, left thigh: Secondary | ICD-10-CM | POA: Diagnosis not present

## 2017-10-02 DIAGNOSIS — M62838 Other muscle spasm: Secondary | ICD-10-CM | POA: Diagnosis not present

## 2017-10-02 DIAGNOSIS — M25652 Stiffness of left hip, not elsewhere classified: Secondary | ICD-10-CM | POA: Diagnosis not present

## 2017-10-05 DIAGNOSIS — M25552 Pain in left hip: Secondary | ICD-10-CM | POA: Diagnosis not present

## 2017-10-05 DIAGNOSIS — M1612 Unilateral primary osteoarthritis, left hip: Secondary | ICD-10-CM | POA: Diagnosis not present

## 2017-10-05 DIAGNOSIS — M62838 Other muscle spasm: Secondary | ICD-10-CM | POA: Diagnosis not present

## 2017-10-05 DIAGNOSIS — M62552 Muscle wasting and atrophy, not elsewhere classified, left thigh: Secondary | ICD-10-CM | POA: Diagnosis not present

## 2017-10-05 DIAGNOSIS — M25652 Stiffness of left hip, not elsewhere classified: Secondary | ICD-10-CM | POA: Diagnosis not present

## 2017-10-06 MED FILL — DULoxetine HCL 30 MG CPEP: 30 | 30 days supply | Qty: 60 | Fill #0

## 2017-10-06 MED FILL — BYSTOLIC 5 MG TABLET: 5 | 90 days supply | Qty: 90 | Fill #1

## 2017-10-06 MED FILL — CELECOXIB 200 MG CAP: 200 | 30 days supply | Qty: 60 | Fill #2

## 2017-10-07 DIAGNOSIS — M7062 Trochanteric bursitis, left hip: Secondary | ICD-10-CM | POA: Diagnosis not present

## 2017-10-07 DIAGNOSIS — M62838 Other muscle spasm: Secondary | ICD-10-CM | POA: Diagnosis not present

## 2017-10-07 DIAGNOSIS — M25552 Pain in left hip: Secondary | ICD-10-CM | POA: Diagnosis not present

## 2017-10-07 DIAGNOSIS — M25652 Stiffness of left hip, not elsewhere classified: Secondary | ICD-10-CM | POA: Diagnosis not present

## 2017-10-07 DIAGNOSIS — M1612 Unilateral primary osteoarthritis, left hip: Secondary | ICD-10-CM | POA: Diagnosis not present

## 2017-10-07 DIAGNOSIS — M62552 Muscle wasting and atrophy, not elsewhere classified, left thigh: Secondary | ICD-10-CM | POA: Diagnosis not present

## 2017-10-12 DIAGNOSIS — M25552 Pain in left hip: Secondary | ICD-10-CM | POA: Diagnosis not present

## 2017-10-12 DIAGNOSIS — M62838 Other muscle spasm: Secondary | ICD-10-CM | POA: Diagnosis not present

## 2017-10-12 DIAGNOSIS — M25652 Stiffness of left hip, not elsewhere classified: Secondary | ICD-10-CM | POA: Diagnosis not present

## 2017-10-12 DIAGNOSIS — M62552 Muscle wasting and atrophy, not elsewhere classified, left thigh: Secondary | ICD-10-CM | POA: Diagnosis not present

## 2017-10-12 DIAGNOSIS — M1612 Unilateral primary osteoarthritis, left hip: Secondary | ICD-10-CM | POA: Diagnosis not present

## 2017-10-12 DIAGNOSIS — D801 Nonfamilial hypogammaglobulinemia: Secondary | ICD-10-CM | POA: Diagnosis not present

## 2017-10-19 DIAGNOSIS — D801 Nonfamilial hypogammaglobulinemia: Secondary | ICD-10-CM | POA: Diagnosis not present

## 2017-10-20 DIAGNOSIS — Z0000 Encounter for general adult medical examination without abnormal findings: Secondary | ICD-10-CM | POA: Diagnosis not present

## 2017-10-20 DIAGNOSIS — R6889 Other general symptoms and signs: Secondary | ICD-10-CM | POA: Diagnosis not present

## 2017-10-20 DIAGNOSIS — R05 Cough: Secondary | ICD-10-CM | POA: Diagnosis not present

## 2017-10-20 DIAGNOSIS — J069 Acute upper respiratory infection, unspecified: Secondary | ICD-10-CM | POA: Diagnosis not present

## 2017-10-20 DIAGNOSIS — D801 Nonfamilial hypogammaglobulinemia: Secondary | ICD-10-CM | POA: Diagnosis not present

## 2017-10-20 DIAGNOSIS — C8307 Small cell B-cell lymphoma, spleen: Secondary | ICD-10-CM | POA: Diagnosis not present

## 2017-10-20 DIAGNOSIS — M545 Low back pain: Secondary | ICD-10-CM | POA: Diagnosis not present

## 2017-10-20 DIAGNOSIS — R002 Palpitations: Secondary | ICD-10-CM | POA: Diagnosis not present

## 2017-10-21 DIAGNOSIS — Z0000 Encounter for general adult medical examination without abnormal findings: Secondary | ICD-10-CM | POA: Diagnosis not present

## 2017-10-26 DIAGNOSIS — D801 Nonfamilial hypogammaglobulinemia: Secondary | ICD-10-CM | POA: Diagnosis not present

## 2017-10-27 DIAGNOSIS — M25552 Pain in left hip: Secondary | ICD-10-CM | POA: Diagnosis not present

## 2017-10-27 DIAGNOSIS — M62838 Other muscle spasm: Secondary | ICD-10-CM | POA: Diagnosis not present

## 2017-10-27 DIAGNOSIS — M62552 Muscle wasting and atrophy, not elsewhere classified, left thigh: Secondary | ICD-10-CM | POA: Diagnosis not present

## 2017-10-27 DIAGNOSIS — M25652 Stiffness of left hip, not elsewhere classified: Secondary | ICD-10-CM | POA: Diagnosis not present

## 2017-10-27 DIAGNOSIS — M1612 Unilateral primary osteoarthritis, left hip: Secondary | ICD-10-CM | POA: Diagnosis not present

## 2017-10-28 DIAGNOSIS — M1612 Unilateral primary osteoarthritis, left hip: Secondary | ICD-10-CM | POA: Diagnosis not present

## 2017-10-28 DIAGNOSIS — M25652 Stiffness of left hip, not elsewhere classified: Secondary | ICD-10-CM | POA: Diagnosis not present

## 2017-10-28 DIAGNOSIS — M62552 Muscle wasting and atrophy, not elsewhere classified, left thigh: Secondary | ICD-10-CM | POA: Diagnosis not present

## 2017-10-28 DIAGNOSIS — M62838 Other muscle spasm: Secondary | ICD-10-CM | POA: Diagnosis not present

## 2017-10-28 DIAGNOSIS — M25552 Pain in left hip: Secondary | ICD-10-CM | POA: Diagnosis not present

## 2017-11-02 DIAGNOSIS — M62838 Other muscle spasm: Secondary | ICD-10-CM | POA: Diagnosis not present

## 2017-11-02 DIAGNOSIS — M25552 Pain in left hip: Secondary | ICD-10-CM | POA: Diagnosis not present

## 2017-11-02 DIAGNOSIS — M25652 Stiffness of left hip, not elsewhere classified: Secondary | ICD-10-CM | POA: Diagnosis not present

## 2017-11-02 DIAGNOSIS — M1612 Unilateral primary osteoarthritis, left hip: Secondary | ICD-10-CM | POA: Diagnosis not present

## 2017-11-02 DIAGNOSIS — D801 Nonfamilial hypogammaglobulinemia: Secondary | ICD-10-CM | POA: Diagnosis not present

## 2017-11-02 DIAGNOSIS — M62552 Muscle wasting and atrophy, not elsewhere classified, left thigh: Secondary | ICD-10-CM | POA: Diagnosis not present

## 2017-11-03 DIAGNOSIS — G43909 Migraine, unspecified, not intractable, without status migrainosus: Secondary | ICD-10-CM | POA: Diagnosis not present

## 2017-11-03 DIAGNOSIS — R51 Headache: Secondary | ICD-10-CM | POA: Diagnosis not present

## 2017-11-03 DIAGNOSIS — Z87891 Personal history of nicotine dependence: Secondary | ICD-10-CM | POA: Diagnosis not present

## 2017-11-03 DIAGNOSIS — R42 Dizziness and giddiness: Secondary | ICD-10-CM | POA: Diagnosis not present

## 2017-11-04 DIAGNOSIS — M25652 Stiffness of left hip, not elsewhere classified: Secondary | ICD-10-CM | POA: Diagnosis not present

## 2017-11-04 DIAGNOSIS — M62552 Muscle wasting and atrophy, not elsewhere classified, left thigh: Secondary | ICD-10-CM | POA: Diagnosis not present

## 2017-11-04 DIAGNOSIS — M62838 Other muscle spasm: Secondary | ICD-10-CM | POA: Diagnosis not present

## 2017-11-04 DIAGNOSIS — M25552 Pain in left hip: Secondary | ICD-10-CM | POA: Diagnosis not present

## 2017-11-04 DIAGNOSIS — M1612 Unilateral primary osteoarthritis, left hip: Secondary | ICD-10-CM | POA: Diagnosis not present

## 2017-11-09 DIAGNOSIS — D801 Nonfamilial hypogammaglobulinemia: Secondary | ICD-10-CM | POA: Diagnosis not present

## 2017-11-10 DIAGNOSIS — M62552 Muscle wasting and atrophy, not elsewhere classified, left thigh: Secondary | ICD-10-CM | POA: Diagnosis not present

## 2017-11-10 DIAGNOSIS — M62838 Other muscle spasm: Secondary | ICD-10-CM | POA: Diagnosis not present

## 2017-11-10 DIAGNOSIS — M1612 Unilateral primary osteoarthritis, left hip: Secondary | ICD-10-CM | POA: Diagnosis not present

## 2017-11-10 DIAGNOSIS — M25552 Pain in left hip: Secondary | ICD-10-CM | POA: Diagnosis not present

## 2017-11-10 DIAGNOSIS — M25652 Stiffness of left hip, not elsewhere classified: Secondary | ICD-10-CM | POA: Diagnosis not present

## 2017-11-12 DIAGNOSIS — M62552 Muscle wasting and atrophy, not elsewhere classified, left thigh: Secondary | ICD-10-CM | POA: Diagnosis not present

## 2017-11-12 DIAGNOSIS — M25652 Stiffness of left hip, not elsewhere classified: Secondary | ICD-10-CM | POA: Diagnosis not present

## 2017-11-12 DIAGNOSIS — M25552 Pain in left hip: Secondary | ICD-10-CM | POA: Diagnosis not present

## 2017-11-12 DIAGNOSIS — M62838 Other muscle spasm: Secondary | ICD-10-CM | POA: Diagnosis not present

## 2017-11-12 DIAGNOSIS — M1612 Unilateral primary osteoarthritis, left hip: Secondary | ICD-10-CM | POA: Diagnosis not present

## 2017-11-16 DIAGNOSIS — M25652 Stiffness of left hip, not elsewhere classified: Secondary | ICD-10-CM | POA: Diagnosis not present

## 2017-11-16 DIAGNOSIS — M62838 Other muscle spasm: Secondary | ICD-10-CM | POA: Diagnosis not present

## 2017-11-16 DIAGNOSIS — M62552 Muscle wasting and atrophy, not elsewhere classified, left thigh: Secondary | ICD-10-CM | POA: Diagnosis not present

## 2017-11-16 DIAGNOSIS — M1612 Unilateral primary osteoarthritis, left hip: Secondary | ICD-10-CM | POA: Diagnosis not present

## 2017-11-16 DIAGNOSIS — M25552 Pain in left hip: Secondary | ICD-10-CM | POA: Diagnosis not present

## 2017-11-16 DIAGNOSIS — D801 Nonfamilial hypogammaglobulinemia: Secondary | ICD-10-CM | POA: Diagnosis not present

## 2017-11-19 DIAGNOSIS — M25652 Stiffness of left hip, not elsewhere classified: Secondary | ICD-10-CM | POA: Diagnosis not present

## 2017-11-19 DIAGNOSIS — M62552 Muscle wasting and atrophy, not elsewhere classified, left thigh: Secondary | ICD-10-CM | POA: Diagnosis not present

## 2017-11-19 DIAGNOSIS — M1612 Unilateral primary osteoarthritis, left hip: Secondary | ICD-10-CM | POA: Diagnosis not present

## 2017-11-19 DIAGNOSIS — M62838 Other muscle spasm: Secondary | ICD-10-CM | POA: Diagnosis not present

## 2017-11-19 DIAGNOSIS — M25552 Pain in left hip: Secondary | ICD-10-CM | POA: Diagnosis not present

## 2017-11-20 DIAGNOSIS — M1612 Unilateral primary osteoarthritis, left hip: Secondary | ICD-10-CM | POA: Diagnosis not present

## 2017-11-20 DIAGNOSIS — M25552 Pain in left hip: Secondary | ICD-10-CM | POA: Diagnosis not present

## 2017-11-20 DIAGNOSIS — M5136 Other intervertebral disc degeneration, lumbar region: Secondary | ICD-10-CM | POA: Diagnosis not present

## 2017-11-20 DIAGNOSIS — M7062 Trochanteric bursitis, left hip: Secondary | ICD-10-CM | POA: Diagnosis not present

## 2017-11-20 DIAGNOSIS — M5416 Radiculopathy, lumbar region: Secondary | ICD-10-CM | POA: Diagnosis not present

## 2017-11-20 DIAGNOSIS — G8929 Other chronic pain: Secondary | ICD-10-CM | POA: Diagnosis not present

## 2017-11-20 DIAGNOSIS — M47816 Spondylosis without myelopathy or radiculopathy, lumbar region: Secondary | ICD-10-CM | POA: Diagnosis not present

## 2017-11-20 DIAGNOSIS — M545 Low back pain: Secondary | ICD-10-CM | POA: Diagnosis not present

## 2017-11-23 DIAGNOSIS — D801 Nonfamilial hypogammaglobulinemia: Secondary | ICD-10-CM | POA: Diagnosis not present

## 2017-11-23 MED FILL — DULoxetine HCL 30 MG CPEP: 30 | 30 days supply | Qty: 60 | Fill #0

## 2017-11-23 MED FILL — VIT D2 1.25 MG (50,000 UNIT: 1.25 MG | 84 days supply | Qty: 12 | Fill #0

## 2017-11-23 MED FILL — HYDROCHLOROTHIAZIDE 25 MG T: 25 | 30 days supply | Qty: 30 | Fill #2

## 2017-11-30 DIAGNOSIS — D801 Nonfamilial hypogammaglobulinemia: Secondary | ICD-10-CM | POA: Diagnosis not present

## 2017-12-03 MED FILL — BYSTOLIC 10 MG TABLET: 10 | 90 days supply | Qty: 90 | Fill #0 | Status: TO

## 2017-12-09 DIAGNOSIS — M25552 Pain in left hip: Secondary | ICD-10-CM | POA: Diagnosis not present

## 2017-12-09 DIAGNOSIS — D801 Nonfamilial hypogammaglobulinemia: Secondary | ICD-10-CM | POA: Diagnosis not present

## 2017-12-10 MED FILL — POTASSIUM CL ER 20 MEQ TABL: 20 | 30 days supply | Qty: 30 | Fill #0

## 2017-12-10 MED FILL — tiZANidine HCL 4 MG TABS: 4 | 30 days supply | Qty: 30 | Fill #0

## 2017-12-16 DIAGNOSIS — D801 Nonfamilial hypogammaglobulinemia: Secondary | ICD-10-CM | POA: Diagnosis not present

## 2017-12-23 DIAGNOSIS — D801 Nonfamilial hypogammaglobulinemia: Secondary | ICD-10-CM | POA: Diagnosis not present

## 2017-12-28 MED FILL — DULoxetine HCL 30 MG CPEP: 30 | 30 days supply | Qty: 60 | Fill #1

## 2017-12-30 DIAGNOSIS — D801 Nonfamilial hypogammaglobulinemia: Secondary | ICD-10-CM | POA: Diagnosis not present

## 2018-01-21 DIAGNOSIS — D801 Nonfamilial hypogammaglobulinemia: Secondary | ICD-10-CM | POA: Diagnosis not present

## 2018-01-26 MED FILL — tiZANidine HCL 4 MG TABS: 4 | 30 days supply | Qty: 30 | Fill #1

## 2018-01-26 MED FILL — HYDROCHLOROTHIAZIDE 25 MG T: 25 | 90 days supply | Qty: 90 | Fill #0 | Status: TO

## 2018-02-05 DIAGNOSIS — M25552 Pain in left hip: Secondary | ICD-10-CM | POA: Diagnosis not present

## 2018-02-05 DIAGNOSIS — M869 Osteomyelitis, unspecified: Secondary | ICD-10-CM | POA: Diagnosis not present

## 2018-02-09 DIAGNOSIS — M25552 Pain in left hip: Secondary | ICD-10-CM | POA: Diagnosis not present

## 2018-02-09 MED FILL — traMADol HCL 50 MG TABS: 50 | 5 days supply | Qty: 30 | Fill #0

## 2018-02-09 MED FILL — POTASSIUM CL ER 20 MEQ TABL: 20 | 30 days supply | Qty: 30 | Fill #1 | Status: TO

## 2018-02-09 MED FILL — DULoxetine HCL 30 MG CPEP: 30 | 30 days supply | Qty: 60 | Fill #2 | Status: TO

## 2018-02-10 DIAGNOSIS — M879 Osteonecrosis, unspecified: Secondary | ICD-10-CM | POA: Diagnosis not present

## 2018-02-10 DIAGNOSIS — M25552 Pain in left hip: Secondary | ICD-10-CM | POA: Diagnosis not present

## 2018-02-12 DIAGNOSIS — M879 Osteonecrosis, unspecified: Secondary | ICD-10-CM | POA: Diagnosis not present

## 2018-02-17 DIAGNOSIS — R7301 Impaired fasting glucose: Secondary | ICD-10-CM | POA: Diagnosis not present

## 2018-02-17 DIAGNOSIS — M879 Osteonecrosis, unspecified: Secondary | ICD-10-CM | POA: Diagnosis not present

## 2018-02-17 DIAGNOSIS — Z0184 Encounter for antibody response examination: Secondary | ICD-10-CM | POA: Diagnosis not present

## 2018-02-17 DIAGNOSIS — M25552 Pain in left hip: Secondary | ICD-10-CM | POA: Diagnosis not present

## 2018-02-17 DIAGNOSIS — Z9189 Other specified personal risk factors, not elsewhere classified: Secondary | ICD-10-CM | POA: Diagnosis not present

## 2018-02-18 DIAGNOSIS — M879 Osteonecrosis, unspecified: Secondary | ICD-10-CM | POA: Diagnosis not present

## 2018-02-18 DIAGNOSIS — R269 Unspecified abnormalities of gait and mobility: Secondary | ICD-10-CM | POA: Diagnosis not present

## 2018-02-23 MED FILL — VIT D2 1.25 MG (50,000 UNIT: 1.25 MG | 84 days supply | Qty: 12 | Fill #1 | Status: TO

## 2018-02-23 MED FILL — tiZANidine HCL 4 MG TABS: 4 | 30 days supply | Qty: 30 | Fill #2 | Status: TO

## 2018-03-04 DIAGNOSIS — M5136 Other intervertebral disc degeneration, lumbar region: Secondary | ICD-10-CM | POA: Diagnosis not present

## 2018-03-04 DIAGNOSIS — M25552 Pain in left hip: Secondary | ICD-10-CM | POA: Diagnosis not present

## 2018-03-04 DIAGNOSIS — M47816 Spondylosis without myelopathy or radiculopathy, lumbar region: Secondary | ICD-10-CM | POA: Diagnosis not present

## 2018-03-04 DIAGNOSIS — G8929 Other chronic pain: Secondary | ICD-10-CM | POA: Diagnosis not present

## 2018-03-04 DIAGNOSIS — Z79891 Long term (current) use of opiate analgesic: Secondary | ICD-10-CM | POA: Diagnosis not present

## 2018-03-04 DIAGNOSIS — M5416 Radiculopathy, lumbar region: Secondary | ICD-10-CM | POA: Diagnosis not present

## 2018-03-04 DIAGNOSIS — M545 Low back pain: Secondary | ICD-10-CM | POA: Diagnosis not present

## 2018-03-04 DIAGNOSIS — M1612 Unilateral primary osteoarthritis, left hip: Secondary | ICD-10-CM | POA: Diagnosis not present

## 2018-03-04 DIAGNOSIS — D801 Nonfamilial hypogammaglobulinemia: Secondary | ICD-10-CM | POA: Diagnosis not present

## 2018-03-04 DIAGNOSIS — M879 Osteonecrosis, unspecified: Secondary | ICD-10-CM | POA: Diagnosis not present

## 2018-03-09 MED FILL — POTASSIUM CL ER 20 MEQ TABL: 20 | 30 days supply | Qty: 30 | Fill #0

## 2018-03-09 MED FILL — SHIPPING COST: 1 days supply | Qty: 1 | Fill #0

## 2018-03-09 MED FILL — BYSTOLIC 10 MG TABLET: 10 | 90 days supply | Qty: 90 | Fill #0

## 2018-03-09 MED FILL — DULoxetine HCL 30 MG CPEP: 30 | 30 days supply | Qty: 60 | Fill #0

## 2018-03-10 DIAGNOSIS — M879 Osteonecrosis, unspecified: Secondary | ICD-10-CM | POA: Diagnosis not present

## 2018-03-11 DIAGNOSIS — M25552 Pain in left hip: Secondary | ICD-10-CM | POA: Diagnosis not present

## 2018-03-11 DIAGNOSIS — M5416 Radiculopathy, lumbar region: Secondary | ICD-10-CM | POA: Diagnosis not present

## 2018-03-11 DIAGNOSIS — G8929 Other chronic pain: Secondary | ICD-10-CM | POA: Diagnosis not present

## 2018-03-11 DIAGNOSIS — M545 Low back pain: Secondary | ICD-10-CM | POA: Diagnosis not present

## 2018-03-11 DIAGNOSIS — M879 Osteonecrosis, unspecified: Secondary | ICD-10-CM | POA: Diagnosis not present

## 2018-03-11 DIAGNOSIS — M47816 Spondylosis without myelopathy or radiculopathy, lumbar region: Secondary | ICD-10-CM | POA: Diagnosis not present

## 2018-03-11 DIAGNOSIS — M1612 Unilateral primary osteoarthritis, left hip: Secondary | ICD-10-CM | POA: Diagnosis not present

## 2018-03-11 DIAGNOSIS — M5136 Other intervertebral disc degeneration, lumbar region: Secondary | ICD-10-CM | POA: Diagnosis not present

## 2018-03-22 MED FILL — tiZANidine HCL 4 MG TABS: 4 | 30 days supply | Qty: 30 | Fill #0

## 2018-03-22 MED FILL — SHIPPING COST: 1 days supply | Qty: 1 | Fill #1

## 2018-03-25 DIAGNOSIS — M25552 Pain in left hip: Secondary | ICD-10-CM | POA: Diagnosis not present

## 2018-03-25 DIAGNOSIS — M5136 Other intervertebral disc degeneration, lumbar region: Secondary | ICD-10-CM | POA: Diagnosis not present

## 2018-03-25 DIAGNOSIS — M545 Low back pain: Secondary | ICD-10-CM | POA: Diagnosis not present

## 2018-03-25 DIAGNOSIS — M5416 Radiculopathy, lumbar region: Secondary | ICD-10-CM | POA: Diagnosis not present

## 2018-03-25 DIAGNOSIS — M1612 Unilateral primary osteoarthritis, left hip: Secondary | ICD-10-CM | POA: Diagnosis not present

## 2018-03-25 DIAGNOSIS — M879 Osteonecrosis, unspecified: Secondary | ICD-10-CM | POA: Diagnosis not present

## 2018-03-25 DIAGNOSIS — G8929 Other chronic pain: Secondary | ICD-10-CM | POA: Diagnosis not present

## 2018-03-25 DIAGNOSIS — M47816 Spondylosis without myelopathy or radiculopathy, lumbar region: Secondary | ICD-10-CM | POA: Diagnosis not present

## 2018-03-29 MED FILL — SHIPPING COST: 1 days supply | Qty: 1 | Fill #2

## 2018-03-29 MED FILL — TOPIRAMATE 50 MG TABLET: 50 | 30 days supply | Qty: 90 | Fill #0

## 2018-03-31 DIAGNOSIS — M1632 Unilateral osteoarthritis resulting from hip dysplasia, left hip: Secondary | ICD-10-CM | POA: Diagnosis not present

## 2018-03-31 DIAGNOSIS — Z6841 Body Mass Index (BMI) 40.0 and over, adult: Secondary | ICD-10-CM | POA: Diagnosis not present

## 2018-03-31 DIAGNOSIS — M87052 Idiopathic aseptic necrosis of left femur: Secondary | ICD-10-CM | POA: Diagnosis not present

## 2018-04-05 DIAGNOSIS — D801 Nonfamilial hypogammaglobulinemia: Secondary | ICD-10-CM | POA: Diagnosis not present

## 2018-04-13 MED FILL — POTASSIUM CL ER 20 MEQ TABL: 20 | 30 days supply | Qty: 30 | Fill #1

## 2018-04-13 MED FILL — DULoxetine HCL 30 MG CPEP: 30 | 30 days supply | Qty: 60 | Fill #1

## 2018-04-13 MED FILL — SHIPPING COST: 1 days supply | Qty: 1 | Fill #3

## 2018-04-15 DIAGNOSIS — M879 Osteonecrosis, unspecified: Secondary | ICD-10-CM | POA: Diagnosis not present

## 2018-04-21 DIAGNOSIS — M25552 Pain in left hip: Secondary | ICD-10-CM | POA: Diagnosis not present

## 2018-04-21 DIAGNOSIS — M1612 Unilateral primary osteoarthritis, left hip: Secondary | ICD-10-CM | POA: Diagnosis not present

## 2018-04-27 MED FILL — SHIPPING COST: 1 days supply | Qty: 1 | Fill #4

## 2018-04-27 MED FILL — tiZANidine HCL 4 MG TABS: 4 | 30 days supply | Qty: 30 | Fill #1

## 2018-04-28 DIAGNOSIS — R002 Palpitations: Secondary | ICD-10-CM | POA: Diagnosis not present

## 2018-04-28 DIAGNOSIS — M25552 Pain in left hip: Secondary | ICD-10-CM | POA: Diagnosis not present

## 2018-04-28 DIAGNOSIS — D801 Nonfamilial hypogammaglobulinemia: Secondary | ICD-10-CM | POA: Diagnosis not present

## 2018-04-28 DIAGNOSIS — J069 Acute upper respiratory infection, unspecified: Secondary | ICD-10-CM | POA: Diagnosis not present

## 2018-04-28 DIAGNOSIS — F329 Major depressive disorder, single episode, unspecified: Secondary | ICD-10-CM | POA: Diagnosis not present

## 2018-04-28 DIAGNOSIS — E785 Hyperlipidemia, unspecified: Secondary | ICD-10-CM | POA: Diagnosis not present

## 2018-04-28 DIAGNOSIS — M8700 Idiopathic aseptic necrosis of unspecified bone: Secondary | ICD-10-CM | POA: Diagnosis not present

## 2018-04-28 DIAGNOSIS — C8307 Small cell B-cell lymphoma, spleen: Secondary | ICD-10-CM | POA: Diagnosis not present

## 2018-04-29 DIAGNOSIS — M5416 Radiculopathy, lumbar region: Secondary | ICD-10-CM | POA: Diagnosis not present

## 2018-04-29 DIAGNOSIS — M5136 Other intervertebral disc degeneration, lumbar region: Secondary | ICD-10-CM | POA: Diagnosis not present

## 2018-04-29 DIAGNOSIS — M545 Low back pain: Secondary | ICD-10-CM | POA: Diagnosis not present

## 2018-04-29 DIAGNOSIS — G8929 Other chronic pain: Secondary | ICD-10-CM | POA: Diagnosis not present

## 2018-04-29 DIAGNOSIS — M47816 Spondylosis without myelopathy or radiculopathy, lumbar region: Secondary | ICD-10-CM | POA: Diagnosis not present

## 2018-04-29 DIAGNOSIS — M1612 Unilateral primary osteoarthritis, left hip: Secondary | ICD-10-CM | POA: Diagnosis not present

## 2018-04-29 DIAGNOSIS — M25552 Pain in left hip: Secondary | ICD-10-CM | POA: Diagnosis not present

## 2018-04-29 DIAGNOSIS — M879 Osteonecrosis, unspecified: Secondary | ICD-10-CM | POA: Diagnosis not present

## 2018-04-29 MED FILL — TOPIRAMATE 100 MG TABLET: 100 | 30 days supply | Qty: 60 | Fill #0

## 2018-05-03 DIAGNOSIS — M87052 Idiopathic aseptic necrosis of left femur: Secondary | ICD-10-CM | POA: Diagnosis not present

## 2018-05-03 DIAGNOSIS — M1612 Unilateral primary osteoarthritis, left hip: Secondary | ICD-10-CM | POA: Diagnosis not present

## 2018-05-05 MED FILL — SHIPPING COST: 1 days supply | Qty: 1 | Fill #5

## 2018-05-05 MED FILL — HYDROCHLOROTHIAZIDE 25 MG T: 25 | 90 days supply | Qty: 90 | Fill #0

## 2018-05-07 MED FILL — DULoxetine HCL 30 MG CPEP: 30 | 30 days supply | Qty: 60 | Fill #2

## 2018-05-14 DIAGNOSIS — D801 Nonfamilial hypogammaglobulinemia: Secondary | ICD-10-CM | POA: Diagnosis not present

## 2018-05-18 MED FILL — VIT D2 1.25 MG (50,000 UNIT: 1.25 MG | 84 days supply | Qty: 12 | Fill #0

## 2018-05-18 MED FILL — SHIPPING COST: 1 days supply | Qty: 1 | Fill #6

## 2018-05-19 DIAGNOSIS — E785 Hyperlipidemia, unspecified: Secondary | ICD-10-CM | POA: Diagnosis not present

## 2018-06-07 DIAGNOSIS — G8929 Other chronic pain: Secondary | ICD-10-CM | POA: Diagnosis not present

## 2018-06-07 DIAGNOSIS — M25552 Pain in left hip: Secondary | ICD-10-CM | POA: Diagnosis not present

## 2018-06-07 DIAGNOSIS — M47816 Spondylosis without myelopathy or radiculopathy, lumbar region: Secondary | ICD-10-CM | POA: Diagnosis not present

## 2018-06-07 DIAGNOSIS — M879 Osteonecrosis, unspecified: Secondary | ICD-10-CM | POA: Diagnosis not present

## 2018-06-07 DIAGNOSIS — M545 Low back pain: Secondary | ICD-10-CM | POA: Diagnosis not present

## 2018-06-07 DIAGNOSIS — M5416 Radiculopathy, lumbar region: Secondary | ICD-10-CM | POA: Diagnosis not present

## 2018-06-07 DIAGNOSIS — M1612 Unilateral primary osteoarthritis, left hip: Secondary | ICD-10-CM | POA: Diagnosis not present

## 2018-06-07 DIAGNOSIS — M5136 Other intervertebral disc degeneration, lumbar region: Secondary | ICD-10-CM | POA: Diagnosis not present

## 2018-06-07 MED FILL — SHIPPING COST: 1 days supply | Qty: 1 | Fill #7

## 2018-06-07 MED FILL — TOPIRAMATE 100 MG TABLET: 100 | 30 days supply | Qty: 60 | Fill #0

## 2018-06-07 MED FILL — BUPROPION SR 150 MG TABLET: 150 | 30 days supply | Qty: 60 | Fill #0

## 2018-06-13 DIAGNOSIS — D801 Nonfamilial hypogammaglobulinemia: Secondary | ICD-10-CM | POA: Diagnosis not present

## 2018-06-18 MED FILL — SHIPPING COST: 1 days supply | Qty: 1 | Fill #8

## 2018-06-18 MED FILL — DULoxetine HCL 30 MG CPEP: 30 | 30 days supply | Qty: 60 | Fill #3

## 2018-06-18 MED FILL — BYSTOLIC 10 MG TABLET: 10 | 90 days supply | Qty: 90 | Fill #1

## 2018-06-23 DIAGNOSIS — I10 Essential (primary) hypertension: Secondary | ICD-10-CM | POA: Diagnosis not present

## 2018-06-23 DIAGNOSIS — Z01818 Encounter for other preprocedural examination: Secondary | ICD-10-CM | POA: Diagnosis not present

## 2018-06-23 DIAGNOSIS — I4891 Unspecified atrial fibrillation: Secondary | ICD-10-CM | POA: Diagnosis not present

## 2018-06-23 DIAGNOSIS — I340 Nonrheumatic mitral (valve) insufficiency: Secondary | ICD-10-CM | POA: Diagnosis not present

## 2018-06-29 DIAGNOSIS — Z01818 Encounter for other preprocedural examination: Secondary | ICD-10-CM | POA: Diagnosis not present

## 2018-06-29 DIAGNOSIS — R600 Localized edema: Secondary | ICD-10-CM | POA: Diagnosis not present

## 2018-06-29 DIAGNOSIS — Z23 Encounter for immunization: Secondary | ICD-10-CM | POA: Diagnosis not present

## 2018-06-29 DIAGNOSIS — M79662 Pain in left lower leg: Secondary | ICD-10-CM | POA: Diagnosis not present

## 2018-06-29 DIAGNOSIS — M7989 Other specified soft tissue disorders: Secondary | ICD-10-CM | POA: Diagnosis not present

## 2018-06-29 DIAGNOSIS — M25552 Pain in left hip: Secondary | ICD-10-CM | POA: Diagnosis not present

## 2018-07-07 MED FILL — SHIPPING COST: 1 days supply | Qty: 1 | Fill #9

## 2018-07-07 MED FILL — BUPROPION SR 150 MG TABLET: 150 | 30 days supply | Qty: 60 | Fill #1

## 2018-07-07 MED FILL — TOPIRAMATE 100 MG TABLET: 100 | 30 days supply | Qty: 60 | Fill #1

## 2018-07-18 DIAGNOSIS — D801 Nonfamilial hypogammaglobulinemia: Secondary | ICD-10-CM | POA: Diagnosis not present

## 2018-07-27 ENCOUNTER — Other Ambulatory Visit: Payer: Self-pay | Admitting: *Deleted

## 2018-07-27 NOTE — Patient Outreach (Signed)
[facility]) Care Management  07/27/2018  Monique Clark 04-06-1966 [id number]   Subjective: Telephone call to patient's home  / mobile number, no answer, left HIPAA compliant voicemail message, and requested call back.     Objective: Per KPN (Knowledge Performance Now, point of care tool) and chart review, patient to be admitted 07/28/18 for Unilateral primary osteoarthritis, left hip, at [facility].  Patient also has a history of chronic insomnia, Right lumbar radiculitis, CVID (common variable immunodeficiency), Non Hodgkin's lymphoma, Atrial fibrillation, Diverticulosis.       Assessment: Received UMR Preoperative / Transition of care referral on 07/26/18.    Transition of care follow up pending patient contact.       Plan: RNCM will call patient for 2nd telephone outreach attempt, preoperative call follow up, within 10 business days if no return call.      Jennah Satchell H. Annia Friendly, BSN, [place] Management [facility] Telephonic CM Phone: [phone] Fax: [phone]

## 2018-07-28 ENCOUNTER — Other Ambulatory Visit: Payer: Self-pay | Admitting: *Deleted

## 2018-07-28 DIAGNOSIS — Z6841 Body Mass Index (BMI) 40.0 and over, adult: Secondary | ICD-10-CM | POA: Diagnosis not present

## 2018-07-28 DIAGNOSIS — Z8572 Personal history of non-Hodgkin lymphomas: Secondary | ICD-10-CM | POA: Diagnosis not present

## 2018-07-28 DIAGNOSIS — I10 Essential (primary) hypertension: Secondary | ICD-10-CM | POA: Diagnosis not present

## 2018-07-28 DIAGNOSIS — M7989 Other specified soft tissue disorders: Secondary | ICD-10-CM | POA: Diagnosis not present

## 2018-07-28 DIAGNOSIS — M1632 Unilateral osteoarthritis resulting from hip dysplasia, left hip: Secondary | ICD-10-CM | POA: Diagnosis not present

## 2018-07-28 DIAGNOSIS — M1611 Unilateral primary osteoarthritis, right hip: Secondary | ICD-10-CM | POA: Diagnosis not present

## 2018-07-28 DIAGNOSIS — I4891 Unspecified atrial fibrillation: Secondary | ICD-10-CM | POA: Diagnosis not present

## 2018-07-28 DIAGNOSIS — Z96642 Presence of left artificial hip joint: Secondary | ICD-10-CM | POA: Diagnosis not present

## 2018-07-28 DIAGNOSIS — Z87891 Personal history of nicotine dependence: Secondary | ICD-10-CM | POA: Diagnosis not present

## 2018-07-28 DIAGNOSIS — E119 Type 2 diabetes mellitus without complications: Secondary | ICD-10-CM | POA: Diagnosis not present

## 2018-07-28 DIAGNOSIS — Z9119 Patient's noncompliance with other medical treatment and regimen: Secondary | ICD-10-CM | POA: Diagnosis not present

## 2018-07-28 DIAGNOSIS — Z471 Aftercare following joint replacement surgery: Secondary | ICD-10-CM | POA: Diagnosis not present

## 2018-07-28 DIAGNOSIS — M879 Osteonecrosis, unspecified: Secondary | ICD-10-CM | POA: Diagnosis not present

## 2018-07-28 DIAGNOSIS — Z7952 Long term (current) use of systemic steroids: Secondary | ICD-10-CM | POA: Diagnosis not present

## 2018-07-28 NOTE — Patient Outreach (Signed)
[facility]) Care Management  07/28/2018  Courtany Mcmurphy 07-09-1966 [id number]    Subjective: Telephone call to patient's home  / mobile number, no answer, left HIPAA compliant voicemail message, and requested call back.     Objective: Per KPN (Knowledge Performance Now, point of care tool) and chart review, patient to be admitted 07/28/18 for Unilateral primary osteoarthritis, left hip, at [facility].  Patient also has a history of chronic insomnia, Right lumbar radiculitis, CVID (common variable immunodeficiency), Non Hodgkin's lymphoma, Atrial fibrillation, Diverticulosis.       Assessment: Received UMR Preoperative / Transition of care referral on 07/26/18.    Transition of care follow up pending patient contact.       Plan: RNCM will call patient for 3rd telephone outreach attempt, preoperative call follow up, within 10 business days if no return call.      Areej Tayler H. Annia Friendly, BSN, [place] Management [facility] Telephonic CM Phone: [phone] Fax: [phone]

## 2018-07-28 NOTE — Patient Outreach (Signed)
[facility]) Care Management  07/28/2018  Neira Bentsen [birth year] [id number]   Subjective: Telephone call from patient's home  / mobile number, states she is returning call, HIPAA verified, she is currently in the hospital, had surgery, primary MD is Dr. Calton Dach in [place], she is unable to talk at this time, physical therapist has come into her room to do physical therapy, and will call this RNCM at a later time.      Objective:Per KPN (Knowledge Performance Now, point of care tool) and chart review,patient to be admitted 07/28/18 forUnilateral primary osteoarthritis, left hip, [facility]. Patient also has a history of chronic insomnia, Right lumbar radiculitis,CVID (common variable immunodeficiency),Non Hodgkin's lymphoma,Atrial fibrillation,Diverticulosis.     Assessment: Received UMR Preoperative / Transition of care referral on 07/26/18.Transition of care follow up pending patient contact.      Plan:RNCM will call patient for 3rd telephone outreach attempt, preoperative call follow up, within 10 business days if no return call.      Cecely Rengel H. Annia Friendly, BSN, [place] Management [facility] Telephonic CM Phone: [phone] Fax: [phone]

## 2018-07-29 ENCOUNTER — Other Ambulatory Visit: Payer: Self-pay | Admitting: *Deleted

## 2018-07-29 DIAGNOSIS — Z96642 Presence of left artificial hip joint: Secondary | ICD-10-CM | POA: Insufficient documentation

## 2018-07-29 DIAGNOSIS — Z96643 Presence of artificial hip joint, bilateral: Secondary | ICD-10-CM | POA: Insufficient documentation

## 2018-07-29 NOTE — Patient Outreach (Signed)
[facility]) Care Management  07/29/2018  Monique Clark Dec 04, 1965 [id number]   Subjective: Telephone call to patient's home / mobile number, spoke with patient, and HIPAA verified.  Discussed [facility] Care Management UMR Transition of care follow up, preoperative call follow up, patient voiced understanding, and is in agreement to both types of follow up.   Patient states she is currently in the hospital [name] [place] [place] for hip surgery, admitted on 07/28/18, will be discharging on Friday 07/30/18 or Saturday 07/31/18.   States she had the surgery at [name] because no provider in [place] would perform the surgery due to her BMI (Body Mass Index).  States she will meet with the hospital inpatient CM today to discuss discharge plan to [name]  inpatient rehab facilityin [place].  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  States she is accessing the following [facility] benefits: outpatient pharmacy, hospital indemnity, and has family medical leave act (FMLA) in place.  Patient states she does not have any  care coordination, disease management, disease monitoring, transportation, community resource, or pharmacy needs at this time.  States she is very appreciative of the follow up and is in agreement to receive [place] Management information in the future.       Objective:Per KPN (Knowledge Performance Now, point of care tool) and chart review,patient to be admitted 07/28/18 forUnilateral primary osteoarthritis, left hip, [facility]. Patient also has a history of chronic insomnia, Right lumbar radiculitis,CVID (common variable immunodeficiency),Non Hodgkin's lymphoma,Atrial fibrillation,Diverticulosis.      Assessment: Received UMR Preoperative / Transition of care referral on  07/26/18.Transition of care follow up pending notification of patient discharge.      Plan:RNCM will call patient for  telephone outreach attempt, transition of care follow up, within 3 business days of hospital discharge notification.       [name], BSN, [place] Management [facility] Telephonic CM Phone: [phone] Fax: [phone]

## 2018-08-02 ENCOUNTER — Other Ambulatory Visit: Payer: Self-pay | Admitting: *Deleted

## 2018-08-02 NOTE — Patient Outreach (Signed)
[facility]) Care Management  08/02/2018  Monique Clark 1966/05/16 [id number]   Subjective:Telephone call to patient's home  / mobile number, no answer, left HIPAA compliant voicemail message, and requested call back.    Objective:Per KPN (Knowledge Performance Now, point of care tool) and chart review,patient hosptalized 07/28/18 forUnilateral primary osteoarthritis, left hip, [facility], discharge date pending. Patient also has a history of chronic insomnia, Right lumbar radiculitis,CVID (common variable immunodeficiency),Non Hodgkin's lymphoma,Atrial fibrillation,Diverticulosis.      Assessment: Received UMR Preoperative / Transition of care referral on 07/26/18.Transition of care follow up pending patient contact.       Plan:RNCM will send unsuccessful outreach  letter, [facility] pamphlet, will call patient for 2nd telephone outreach attempt, transition of care follow up, and proceed with case closure, within 10 business days if no return call.        Elvina Bosch H. Annia Friendly, BSN, [place] Management [facility] Telephonic CM Phone: [phone] Fax: [phone]

## 2018-08-03 ENCOUNTER — Other Ambulatory Visit: Payer: Self-pay | Admitting: *Deleted

## 2018-08-03 DIAGNOSIS — I10 Essential (primary) hypertension: Secondary | ICD-10-CM | POA: Diagnosis not present

## 2018-08-03 DIAGNOSIS — Z471 Aftercare following joint replacement surgery: Secondary | ICD-10-CM | POA: Diagnosis not present

## 2018-08-03 DIAGNOSIS — Z96642 Presence of left artificial hip joint: Secondary | ICD-10-CM | POA: Diagnosis not present

## 2018-08-03 DIAGNOSIS — Z8572 Personal history of non-Hodgkin lymphomas: Secondary | ICD-10-CM | POA: Diagnosis not present

## 2018-08-03 NOTE — Patient Outreach (Signed)
[facility]) Care Management  08/03/2018  Monique Clark [birth year] [id number]   Subjective:Telephone call to patient's home  / mobile number, no answer, left HIPAA compliant voicemail message, and requested call back.    Objective:Per KPN (Knowledge Performance Now, point of care tool) and chart review,patient hosptalized 07/28/18 forUnilateral primary osteoarthritis, left hip, [facility], discharge date pending. Patient also has a history of chronic insomnia, Right lumbar radiculitis,CVID (common variable immunodeficiency),Non Hodgkin's lymphoma,Atrial fibrillation,Diverticulosis.      Assessment: Received UMR Preoperative / Transition of care referral on 07/26/18.Transition of care follow up pending patient contact.  Per 07/29/18 conversation with patient, she may be discharged from the hospital on 07/30/18 or 07/31/18.       Plan:RNCM will send unsuccessful outreach  letter, [facility] pamphlet, will call patient for 3rd telephone outreach attempt, transition of care follow up, and proceed with case closure, within 10 business days if no return call.       Kingsley Herandez H. Annia Friendly, BSN, [place] Management [facility] Telephonic CM Phone: [phone] Fax: [phone]

## 2018-08-04 ENCOUNTER — Other Ambulatory Visit: Payer: Self-pay | Admitting: *Deleted

## 2018-08-04 NOTE — Patient Outreach (Addendum)
[place] [facility]) Care Management  08/04/2018  Monique Clark 05/31/1966 [id number]   Subjective:Telephone call to patient's home / mobile number, no answer, left HIPAA compliant voicemail message, and requested call back.    Objective:Per KPN (Knowledge Performance Now, point of care tool) and chart review,patienthosptalized10/30/19 forUnilateral primary osteoarthritis, left hip, [facility], discharge date unknown. Patient also has a history of chronic insomnia, Right lumbar radiculitis,CVID (common variable immunodeficiency),Non Hodgkin's lymphoma,Atrial fibrillation,Diverticulosis.      Assessment: Received UMR Preoperative / Transition of care referral on 07/26/18.Transition of care follow up pending patient contact.  Per 07/29/18 conversation with patient, she may be discharged from the hospital on 07/30/18 or 07/31/18.       Plan:RNCM has sent unsuccessful outreach letter, [facility] pamphlet, and will  proceed with case closure, within 10 business days if no return call.       Alaze Garverick H. Annia Friendly, BSN, [place] Management [facility] Telephonic CM Phone: [phone] Fax: [phone]

## 2018-08-05 ENCOUNTER — Other Ambulatory Visit: Payer: Self-pay | Admitting: *Deleted

## 2018-08-05 ENCOUNTER — Encounter: Payer: Self-pay | Admitting: *Deleted

## 2018-08-05 MED FILL — HYDROCHLOROTHIAZIDE 25 MG T: 25 | 90 days supply | Qty: 90 | Fill #1

## 2018-08-05 MED FILL — SHIPPING COST: 1 days supply | Qty: 1 | Fill #10

## 2018-08-05 NOTE — Patient Outreach (Signed)
[place] [facility]) Care Management  08/05/2018  Monique Clark Feb 14, 1966 [id number]   Subjective: Telephone call from  patient's home / mobile number, spoke with patient, and HIPAA verified.  Discussed [facility] Care Management UMR Transition of care follow up, patient voiced understanding, and is in agreement to follow up.  Patient states she remembers speaking with this RNCM in the past, apologized for not returning call sooner, just recently received RNCM's message, and was having issues with her mobile.   Patient states RNCM can her back on home number [phone]) if call gets disconnected.   Patient states she is doing well, has decreased pain overall since surgery, surgery went great, did not require inpatient acute rehab post hospital discharge, was discharged on 08/09/18, is received home health physical therapy, and therapy is going good.   States she has a follow up appointment with surgeon on 08/14/18 and 09/15/18, with primary MD on 09/08/18.  Patient states she is able to manage self care and has assistance as needed.  Patient voices understanding of medical diagnosis, surgery,  and treatment plan.  [facility] benefits discussed on 07/29/18 preoperative call and patient states no additional questions at this time. Patient states she does not have any education material, transition of care, care coordination, disease management, disease monitoring, transportation, community resource, or pharmacy needs at this time.  States she is very appreciative of the follow up and is in agreement to receive [place] Management information.      Objective:Per KPN (Knowledge Performance Now, point of care tool) and chart review,patienthosptalized10/30/19 -07/30/18 forUnilateral primary osteoarthritis, left hip, [facility]. Patient also has a history of chronic insomnia, Right lumbar radiculitis,CVID (common variable immunodeficiency),Non Hodgkin's lymphoma,Atrial  fibrillation,Diverticulosis.       Assessment: Received UMR Preoperative / Transition of care referral on 07/26/18.Transition of care follow up completed, no care management needs, and will proceed with case closure.        Plan:RNCM will send patient successful outreach letter, [facility] pamphlet, and magnet. RNCM will complete case closure due to follow up completed / no care management needs.        Kolton Kienle H. Annia Friendly, BSN, [place] Management [facility] Telephonic CM Phone: [phone] Fax: [phone]

## 2018-08-09 DIAGNOSIS — M19071 Primary osteoarthritis, right ankle and foot: Secondary | ICD-10-CM | POA: Diagnosis not present

## 2018-08-09 DIAGNOSIS — M7731 Calcaneal spur, right foot: Secondary | ICD-10-CM | POA: Diagnosis not present

## 2018-08-09 DIAGNOSIS — M65871 Other synovitis and tenosynovitis, right ankle and foot: Secondary | ICD-10-CM | POA: Diagnosis not present

## 2018-08-10 DIAGNOSIS — I10 Essential (primary) hypertension: Secondary | ICD-10-CM | POA: Diagnosis not present

## 2018-08-10 DIAGNOSIS — Z471 Aftercare following joint replacement surgery: Secondary | ICD-10-CM | POA: Diagnosis not present

## 2018-08-10 DIAGNOSIS — Z96642 Presence of left artificial hip joint: Secondary | ICD-10-CM | POA: Diagnosis not present

## 2018-08-10 DIAGNOSIS — Z8572 Personal history of non-Hodgkin lymphomas: Secondary | ICD-10-CM | POA: Diagnosis not present

## 2018-08-12 DIAGNOSIS — M79671 Pain in right foot: Secondary | ICD-10-CM | POA: Diagnosis not present

## 2018-08-17 DIAGNOSIS — Z96642 Presence of left artificial hip joint: Secondary | ICD-10-CM | POA: Diagnosis not present

## 2018-08-17 DIAGNOSIS — Z471 Aftercare following joint replacement surgery: Secondary | ICD-10-CM | POA: Diagnosis not present

## 2018-08-17 DIAGNOSIS — Z8572 Personal history of non-Hodgkin lymphomas: Secondary | ICD-10-CM | POA: Diagnosis not present

## 2018-08-17 DIAGNOSIS — D801 Nonfamilial hypogammaglobulinemia: Secondary | ICD-10-CM | POA: Diagnosis not present

## 2018-08-17 DIAGNOSIS — I10 Essential (primary) hypertension: Secondary | ICD-10-CM | POA: Diagnosis not present

## 2018-08-30 MED FILL — POTASSIUM CHLORIDE CRYS ER: 20 | 30 days supply | Qty: 30 | Fill #2

## 2018-08-30 MED FILL — TOPIRAMATE 100 MG TABLET: 100 | 30 days supply | Qty: 60 | Fill #2

## 2018-08-30 MED FILL — SHIPPING COST: 1 days supply | Qty: 1 | Fill #11

## 2018-08-31 DIAGNOSIS — R0602 Shortness of breath: Secondary | ICD-10-CM | POA: Diagnosis not present

## 2018-08-31 DIAGNOSIS — R05 Cough: Secondary | ICD-10-CM | POA: Diagnosis not present

## 2018-08-31 DIAGNOSIS — J0100 Acute maxillary sinusitis, unspecified: Secondary | ICD-10-CM | POA: Diagnosis not present

## 2018-09-16 DIAGNOSIS — M47818 Spondylosis without myelopathy or radiculopathy, sacral and sacrococcygeal region: Secondary | ICD-10-CM | POA: Diagnosis not present

## 2018-09-16 DIAGNOSIS — Z96642 Presence of left artificial hip joint: Secondary | ICD-10-CM | POA: Diagnosis not present

## 2018-09-16 DIAGNOSIS — M1611 Unilateral primary osteoarthritis, right hip: Secondary | ICD-10-CM | POA: Diagnosis not present

## 2018-09-16 DIAGNOSIS — M25552 Pain in left hip: Secondary | ICD-10-CM | POA: Diagnosis not present

## 2018-09-27 DIAGNOSIS — D801 Nonfamilial hypogammaglobulinemia: Secondary | ICD-10-CM | POA: Diagnosis not present

## 2018-09-30 DIAGNOSIS — Z96642 Presence of left artificial hip joint: Secondary | ICD-10-CM | POA: Diagnosis not present

## 2018-09-30 DIAGNOSIS — D801 Nonfamilial hypogammaglobulinemia: Secondary | ICD-10-CM | POA: Diagnosis not present

## 2018-09-30 DIAGNOSIS — E669 Obesity, unspecified: Secondary | ICD-10-CM | POA: Diagnosis not present

## 2018-09-30 DIAGNOSIS — I340 Nonrheumatic mitral (valve) insufficiency: Secondary | ICD-10-CM | POA: Diagnosis not present

## 2018-09-30 DIAGNOSIS — I10 Essential (primary) hypertension: Secondary | ICD-10-CM | POA: Diagnosis not present

## 2018-09-30 DIAGNOSIS — I4891 Unspecified atrial fibrillation: Secondary | ICD-10-CM | POA: Diagnosis not present

## 2018-09-30 DIAGNOSIS — Z7982 Long term (current) use of aspirin: Secondary | ICD-10-CM | POA: Diagnosis not present

## 2018-09-30 DIAGNOSIS — Z888 Allergy status to other drugs, medicaments and biological substances status: Secondary | ICD-10-CM | POA: Diagnosis not present

## 2018-09-30 DIAGNOSIS — Z887 Allergy status to serum and vaccine status: Secondary | ICD-10-CM | POA: Diagnosis not present

## 2018-09-30 MED FILL — SHIPPING COST: 1 days supply | Qty: 1 | Fill #12

## 2018-09-30 MED FILL — BYSTOLIC 10 MG TABLET: 10 | 90 days supply | Qty: 90 | Fill #2

## 2018-09-30 MED FILL — TOPIRAMATE 100 MG TABLET: 100 | 30 days supply | Qty: 60 | Fill #3

## 2018-09-30 MED FILL — DULoxetine HCL 30 MG CPEP: 30 | 30 days supply | Qty: 60 | Fill #4

## 2018-09-30 MED FILL — VIT D2 1.25 MG (50,000 UNIT: 1.25 MG | 84 days supply | Qty: 12 | Fill #1

## 2018-10-04 DIAGNOSIS — D801 Nonfamilial hypogammaglobulinemia: Secondary | ICD-10-CM | POA: Diagnosis not present

## 2018-10-12 DIAGNOSIS — D801 Nonfamilial hypogammaglobulinemia: Secondary | ICD-10-CM | POA: Diagnosis not present

## 2018-10-12 DIAGNOSIS — I480 Paroxysmal atrial fibrillation: Secondary | ICD-10-CM | POA: Diagnosis not present

## 2018-10-12 DIAGNOSIS — R5383 Other fatigue: Secondary | ICD-10-CM | POA: Diagnosis not present

## 2018-10-12 DIAGNOSIS — M25552 Pain in left hip: Secondary | ICD-10-CM | POA: Diagnosis not present

## 2018-10-12 DIAGNOSIS — M8700 Idiopathic aseptic necrosis of unspecified bone: Secondary | ICD-10-CM | POA: Diagnosis not present

## 2018-10-13 DIAGNOSIS — D801 Nonfamilial hypogammaglobulinemia: Secondary | ICD-10-CM | POA: Diagnosis not present

## 2018-10-13 DIAGNOSIS — Z887 Allergy status to serum and vaccine status: Secondary | ICD-10-CM | POA: Diagnosis not present

## 2018-10-13 DIAGNOSIS — Z96642 Presence of left artificial hip joint: Secondary | ICD-10-CM | POA: Diagnosis not present

## 2018-10-13 DIAGNOSIS — Z888 Allergy status to other drugs, medicaments and biological substances status: Secondary | ICD-10-CM | POA: Diagnosis not present

## 2018-10-13 DIAGNOSIS — Z7982 Long term (current) use of aspirin: Secondary | ICD-10-CM | POA: Diagnosis not present

## 2018-10-13 DIAGNOSIS — I4891 Unspecified atrial fibrillation: Secondary | ICD-10-CM | POA: Diagnosis not present

## 2018-10-13 DIAGNOSIS — R5383 Other fatigue: Secondary | ICD-10-CM | POA: Diagnosis not present

## 2018-10-13 DIAGNOSIS — E669 Obesity, unspecified: Secondary | ICD-10-CM | POA: Diagnosis not present

## 2018-10-13 DIAGNOSIS — I10 Essential (primary) hypertension: Secondary | ICD-10-CM | POA: Diagnosis not present

## 2018-10-13 DIAGNOSIS — I340 Nonrheumatic mitral (valve) insufficiency: Secondary | ICD-10-CM | POA: Diagnosis not present

## 2018-10-15 MED FILL — POTASSIUM CHLORIDE CRYS ER: 20 | 90 days supply | Qty: 360 | Fill #0

## 2018-10-15 MED FILL — SHIPPING COST: 1 days supply | Qty: 1 | Fill #13

## 2018-10-19 MED FILL — SHIPPING COST: 1 days supply | Qty: 1 | Fill #14

## 2018-10-19 MED FILL — VITAMIN D3 50000 UNIT CAPS: 1.25 MG | 28 days supply | Qty: 8 | Fill #0

## 2018-10-25 DIAGNOSIS — D801 Nonfamilial hypogammaglobulinemia: Secondary | ICD-10-CM | POA: Diagnosis not present

## 2018-10-29 DIAGNOSIS — R350 Frequency of micturition: Secondary | ICD-10-CM | POA: Diagnosis not present

## 2018-10-29 DIAGNOSIS — R300 Dysuria: Secondary | ICD-10-CM | POA: Diagnosis not present

## 2018-10-29 DIAGNOSIS — R110 Nausea: Secondary | ICD-10-CM | POA: Diagnosis not present

## 2018-11-09 DIAGNOSIS — M8700 Idiopathic aseptic necrosis of unspecified bone: Secondary | ICD-10-CM | POA: Diagnosis not present

## 2018-11-09 DIAGNOSIS — M25552 Pain in left hip: Secondary | ICD-10-CM | POA: Diagnosis not present

## 2018-11-09 DIAGNOSIS — R109 Unspecified abdominal pain: Secondary | ICD-10-CM | POA: Diagnosis not present

## 2018-11-09 DIAGNOSIS — I480 Paroxysmal atrial fibrillation: Secondary | ICD-10-CM | POA: Diagnosis not present

## 2018-11-11 MED FILL — PRAMIPEXOLE 0.25 MG TABLET: 0.25 | 30 days supply | Qty: 30 | Fill #0

## 2018-11-11 MED FILL — SHIPPING COST: 1 days supply | Qty: 1 | Fill #15

## 2018-11-11 MED FILL — DILTIAZEM HCL ER COATED BEA: 120 | 30 days supply | Qty: 30 | Fill #0

## 2018-11-22 DIAGNOSIS — D801 Nonfamilial hypogammaglobulinemia: Secondary | ICD-10-CM | POA: Diagnosis not present

## 2018-11-24 DIAGNOSIS — R109 Unspecified abdominal pain: Secondary | ICD-10-CM | POA: Diagnosis not present

## 2018-11-26 MED FILL — SHIPPING COST: 1 days supply | Qty: 1 | Fill #16

## 2018-11-26 MED FILL — tiZANidine HCL 4 MG TABS: 4 | 30 days supply | Qty: 30 | Fill #0

## 2018-11-26 MED FILL — VITAMIN D3 50000 UNIT CAPS: 1.25 MG | 28 days supply | Qty: 8 | Fill #1

## 2018-11-26 MED FILL — HYDROCHLOROTHIAZIDE 25 MG T: 25 | 90 days supply | Qty: 90 | Fill #2

## 2018-11-26 MED FILL — DULoxetine HCL 30 MG CPEP: 30 | 30 days supply | Qty: 60 | Fill #0

## 2018-11-29 MED FILL — SHIPPING COST: 1 days supply | Qty: 1 | Fill #17

## 2018-11-29 MED FILL — TOPIRAMATE 100 MG TABLET: 100 | 30 days supply | Qty: 60 | Fill #0

## 2018-12-06 DIAGNOSIS — M79672 Pain in left foot: Secondary | ICD-10-CM | POA: Diagnosis not present

## 2018-12-16 MED FILL — tiZANidine HCL 4 MG TABS: 4 | 30 days supply | Qty: 30 | Fill #1

## 2018-12-16 MED FILL — CARTIA XT 120 MG CP24: 120 | 30 days supply | Qty: 30 | Fill #1

## 2018-12-16 MED FILL — PRAMIPEXOLE 0.25 MG TABLET: 0.25 | 30 days supply | Qty: 30 | Fill #1

## 2018-12-27 DIAGNOSIS — D801 Nonfamilial hypogammaglobulinemia: Secondary | ICD-10-CM | POA: Diagnosis not present

## 2018-12-28 DIAGNOSIS — Z760 Encounter for issue of repeat prescription: Secondary | ICD-10-CM | POA: Diagnosis not present

## 2019-01-12 MED FILL — PRAMIPEXOLE 0.25 MG TABLET: 0.25 | 30 days supply | Qty: 30 | Fill #2

## 2019-01-12 MED FILL — DILTIAZEM 24HR CD 120 MG CA: 120 | 30 days supply | Qty: 30 | Fill #2

## 2019-01-12 MED FILL — VITAMIN D3 50000 UNIT CAPS: 1.25 MG | 28 days supply | Qty: 8 | Fill #2

## 2019-01-21 MED FILL — tiZANidine HCL 4 MG TABS: 4 | 30 days supply | Qty: 30 | Fill #2

## 2019-01-21 MED FILL — POTASSIUM CHLORIDE CRYS ER: 20 | 90 days supply | Qty: 360 | Fill #1

## 2019-01-21 MED FILL — DULoxetine HCL 30 MG CPEP: 30 | 30 days supply | Qty: 60 | Fill #1

## 2019-01-24 DIAGNOSIS — D801 Nonfamilial hypogammaglobulinemia: Secondary | ICD-10-CM | POA: Diagnosis not present

## 2019-02-04 MED FILL — VITAMIN D3 50000 UNIT CAPS: 1.25 MG | 28 days supply | Qty: 8 | Fill #3

## 2019-02-11 MED FILL — PRAMIPEXOLE 0.25 MG TABLET: 0.25 | 30 days supply | Qty: 30 | Fill #3

## 2019-02-11 MED FILL — DILTIAZEM HCL ER COATED BEA: 120 | 30 days supply | Qty: 30 | Fill #3

## 2019-02-21 DIAGNOSIS — D801 Nonfamilial hypogammaglobulinemia: Secondary | ICD-10-CM | POA: Diagnosis not present

## 2019-02-23 MED FILL — tiZANidine HCL 4 MG TABS: 4 | 30 days supply | Qty: 30 | Fill #3

## 2019-02-23 MED FILL — DULoxetine HCL 30 MG CPEP: 30 | 30 days supply | Qty: 60 | Fill #2

## 2019-03-13 ENCOUNTER — Observation Stay (HOSPITAL_COMMUNITY)
Admission: EM | Admit: 2019-03-13 | Discharge: 2019-03-16 | Disposition: A | Discharge status: Home / Self Care | Payer: 59 | Attending: Family Medicine | Admitting: Family Medicine

## 2019-03-13 ENCOUNTER — Emergency Department (HOSPITAL_COMMUNITY): Payer: 59

## 2019-03-13 ENCOUNTER — Encounter (HOSPITAL_COMMUNITY): Payer: Self-pay | Admitting: Emergency Medicine

## 2019-03-13 DIAGNOSIS — Z1159 Encounter for screening for other viral diseases: Secondary | ICD-10-CM | POA: Diagnosis not present

## 2019-03-13 DIAGNOSIS — R072 Precordial pain: Secondary | ICD-10-CM

## 2019-03-13 DIAGNOSIS — I2510 Atherosclerotic heart disease of native coronary artery without angina pectoris: Secondary | ICD-10-CM | POA: Insufficient documentation

## 2019-03-13 DIAGNOSIS — E669 Obesity, unspecified: Secondary | ICD-10-CM | POA: Diagnosis not present

## 2019-03-13 DIAGNOSIS — I10 Essential (primary) hypertension: Secondary | ICD-10-CM | POA: Diagnosis not present

## 2019-03-13 DIAGNOSIS — I480 Paroxysmal atrial fibrillation: Secondary | ICD-10-CM

## 2019-03-13 DIAGNOSIS — E7800 Pure hypercholesterolemia, unspecified: Secondary | ICD-10-CM | POA: Diagnosis not present

## 2019-03-13 DIAGNOSIS — R0789 Other chest pain: Principal | ICD-10-CM | POA: Insufficient documentation

## 2019-03-13 DIAGNOSIS — E785 Hyperlipidemia, unspecified: Secondary | ICD-10-CM | POA: Diagnosis not present

## 2019-03-13 DIAGNOSIS — Z87891 Personal history of nicotine dependence: Secondary | ICD-10-CM | POA: Diagnosis not present

## 2019-03-13 DIAGNOSIS — Z8572 Personal history of non-Hodgkin lymphomas: Secondary | ICD-10-CM | POA: Insufficient documentation

## 2019-03-13 DIAGNOSIS — D839 Common variable immunodeficiency, unspecified: Secondary | ICD-10-CM | POA: Insufficient documentation

## 2019-03-13 DIAGNOSIS — R7303 Prediabetes: Secondary | ICD-10-CM | POA: Diagnosis not present

## 2019-03-13 DIAGNOSIS — Z6841 Body Mass Index (BMI) 40.0 and over, adult: Secondary | ICD-10-CM | POA: Diagnosis not present

## 2019-03-13 DIAGNOSIS — G2581 Restless legs syndrome: Secondary | ICD-10-CM | POA: Insufficient documentation

## 2019-03-13 DIAGNOSIS — Z7952 Long term (current) use of systemic steroids: Secondary | ICD-10-CM | POA: Insufficient documentation

## 2019-03-13 DIAGNOSIS — R2242 Localized swelling, mass and lump, left lower limb: Secondary | ICD-10-CM

## 2019-03-13 DIAGNOSIS — M7989 Other specified soft tissue disorders: Secondary | ICD-10-CM | POA: Diagnosis not present

## 2019-03-13 DIAGNOSIS — R42 Dizziness and giddiness: Secondary | ICD-10-CM | POA: Insufficient documentation

## 2019-03-13 DIAGNOSIS — Z79899 Other long term (current) drug therapy: Secondary | ICD-10-CM | POA: Insufficient documentation

## 2019-03-13 DIAGNOSIS — Z7982 Long term (current) use of aspirin: Secondary | ICD-10-CM | POA: Diagnosis not present

## 2019-03-13 DIAGNOSIS — R079 Chest pain, unspecified: Secondary | ICD-10-CM | POA: Diagnosis present

## 2019-03-13 LAB — LIPID PANEL
Cholesterol: 222 mg/dL — ABNORMAL HIGH (ref 0–200)
HDL: 59 mg/dL (ref >40)
LDL Cholesterol: 150 mg/dL — ABNORMAL HIGH (ref 0–99)
Total CHOL/HDL Ratio: 3.8 RATIO
Triglycerides: 63 mg/dL (ref <150)
VLDL: 13 mg/dL (ref 0–40)

## 2019-03-13 LAB — HEMOGLOBIN A1C
Hgb A1c MFr Bld: 5.3 % (ref 4.8–5.6)
Mean Plasma Glucose: 105.41 mg/dL

## 2019-03-13 LAB — BASIC METABOLIC PANEL
Anion gap: 11 (ref 5–15)
BUN: 22 mg/dL — ABNORMAL HIGH (ref 6–20)
CO2: 23 mmol/L (ref 22–32)
Calcium: 9.8 mg/dL (ref 8.9–10.3)
Chloride: 101 mmol/L (ref 98–111)
Creatinine, Ser: 0.89 mg/dL (ref 0.44–1.00)
GFR calc Af Amer: >60 mL/min (ref >60)
GFR calc non Af Amer: >60 mL/min (ref >60)
Glucose, Bld: 90 mg/dL (ref 70–99)
Potassium: 3.8 mmol/L (ref 3.5–5.1)
Sodium: 135 mmol/L (ref 135–145)

## 2019-03-13 LAB — MAGNESIUM: Magnesium: 1.8 mg/dL (ref 1.7–2.4)

## 2019-03-13 LAB — TROPONIN I
Troponin I: <0.03 ng/mL (ref <0.03)
Troponin I: <0.03 ng/mL (ref <0.03)
Troponin I: <0.03 ng/mL (ref <0.03)

## 2019-03-13 LAB — PROTIME-INR
INR: 1 (ref 0.8–1.2)
Prothrombin Time: 13.1 seconds (ref 11.4–15.2)

## 2019-03-13 LAB — CBC
HCT: 40.6 % (ref 36.0–46.0)
Hemoglobin: 12.5 g/dL (ref 12.0–15.0)
MCH: 27.1 pg (ref 26.0–34.0)
MCHC: 30.8 g/dL (ref 30.0–36.0)
MCV: 87.9 fL (ref 80.0–100.0)
Platelets: 443 10*3/uL — ABNORMAL HIGH (ref 150–400)
RBC: 4.62 MIL/uL (ref 3.87–5.11)
RDW: 15.6 % — ABNORMAL HIGH (ref 11.5–15.5)
WBC: 9.8 10*3/uL (ref 4.0–10.5)
nRBC: 0 % (ref 0.0–0.2)

## 2019-03-13 LAB — TSH: TSH: 1.43 u[IU]/mL (ref 0.350–4.500)

## 2019-03-13 MED ORDER — NON FORMULARY: 3.0000 mg | Freq: Every evening | Status: DC | PRN

## 2019-03-13 MED ORDER — NITROGLYCERIN 0.4 MG SL SUBL
0.4000 mg | SUBLINGUAL_TABLET | Freq: Once | SUBLINGUAL | Status: DC
  Administered 2019-03-14: 0.8 mg via SUBLINGUAL

## 2019-03-13 MED ORDER — TIZANIDINE HCL 4 MG PO TABS
4.0000 mg | ORAL_TABLET | Freq: Every day | ORAL | Status: DC
  Administered 2019-03-13 – 2019-03-15 (×3): 4 mg via ORAL
  Filled 2019-03-13 (×3): qty 1

## 2019-03-13 MED ORDER — MELATONIN 3 MG PO TABS
3.0000 mg | ORAL_TABLET | Freq: Every evening | ORAL | Status: DC | PRN
  Filled 2019-03-13: qty 1

## 2019-03-13 MED ORDER — POTASSIUM CHLORIDE CRYS ER 20 MEQ PO TBCR
40.0000 meq | EXTENDED_RELEASE_TABLET | Freq: Two times a day (BID) | ORAL | Status: DC
  Administered 2019-03-13 – 2019-03-16 (×6): 40 meq via ORAL
  Filled 2019-03-13 (×6): qty 2

## 2019-03-13 MED ORDER — ACETAMINOPHEN 325 MG PO TABS
650.0000 mg | ORAL_TABLET | ORAL | Status: DC | PRN
  Administered 2019-03-14 – 2019-03-15 (×3): 650 mg via ORAL
  Filled 2019-03-13 (×3): qty 2

## 2019-03-13 MED ORDER — SODIUM CHLORIDE 0.9% FLUSH
3.0000 mL | Freq: Once | INTRAVENOUS | Status: AC
  Administered 2019-03-13: 3 mL via INTRAVENOUS

## 2019-03-13 MED ORDER — FAMOTIDINE 20 MG PO TABS
20.0000 mg | ORAL_TABLET | Freq: Every day | ORAL | Status: DC
  Administered 2019-03-14 – 2019-03-16 (×3): 20 mg via ORAL
  Filled 2019-03-13 (×3): qty 1

## 2019-03-13 MED ORDER — ONDANSETRON HCL 4 MG/2ML IJ SOLN
4.0000 mg | Freq: Once | INTRAMUSCULAR | Status: AC
  Administered 2019-03-13: 12:00:00 4 mg via INTRAVENOUS
  Filled 2019-03-13: qty 2

## 2019-03-13 MED ORDER — PRAMIPEXOLE DIHYDROCHLORIDE 0.25 MG PO TABS
0.2500 mg | ORAL_TABLET | Freq: Every day | ORAL | Status: DC
  Administered 2019-03-13 – 2019-03-15 (×3): 0.25 mg via ORAL
  Filled 2019-03-13 (×4): qty 1

## 2019-03-13 MED ORDER — ASPIRIN 325 MG PO TABS
325.0000 mg | ORAL_TABLET | Freq: Every day | ORAL | Status: DC
  Administered 2019-03-14 – 2019-03-16 (×3): 325 mg via ORAL
  Filled 2019-03-13 (×3): qty 1

## 2019-03-13 MED ORDER — DILTIAZEM HCL ER COATED BEADS 120 MG PO CP24
120.0000 mg | ORAL_CAPSULE | Freq: Every day | ORAL | Status: DC
  Administered 2019-03-13 – 2019-03-15 (×3): 120 mg via ORAL
  Filled 2019-03-13 (×3): qty 1

## 2019-03-13 MED ORDER — DULOXETINE HCL 30 MG PO CPEP
30.0000 mg | ORAL_CAPSULE | Freq: Every day | ORAL | Status: DC
  Administered 2019-03-14 – 2019-03-16 (×3): 30 mg via ORAL
  Filled 2019-03-13 (×3): qty 1

## 2019-03-13 MED ORDER — HEPARIN SODIUM (PORCINE) 5000 UNIT/ML IJ SOLN
5000.0000 [IU] | Freq: Three times a day (TID) | INTRAMUSCULAR | Status: DC
  Administered 2019-03-14 – 2019-03-15 (×4): 5000 [IU] via SUBCUTANEOUS
  Filled 2019-03-13 (×4): qty 1

## 2019-03-13 MED ORDER — MAGNESIUM OXIDE 400 (241.3 MG) MG PO TABS
400.0000 mg | ORAL_TABLET | Freq: Every day | ORAL | Status: DC
  Administered 2019-03-14 – 2019-03-16 (×3): 400 mg via ORAL
  Filled 2019-03-13 (×3): qty 1

## 2019-03-13 MED ORDER — SODIUM CHLORIDE 0.9 % IV BOLUS
500.0000 mL | Freq: Once | INTRAVENOUS | Status: AC
  Administered 2019-03-13: 500 mL via INTRAVENOUS

## 2019-03-13 MED ORDER — NITROGLYCERIN 0.4 MG SL SUBL
0.4000 mg | SUBLINGUAL_TABLET | SUBLINGUAL | Status: DC | PRN
  Administered 2019-03-13: 15:00:00 0.4 mg via SUBLINGUAL
  Filled 2019-03-13: qty 1

## 2019-03-13 MED ORDER — HYDROCHLOROTHIAZIDE 25 MG PO TABS
25.0000 mg | ORAL_TABLET | Freq: Every day | ORAL | Status: DC
  Administered 2019-03-14 – 2019-03-16 (×3): 25 mg via ORAL
  Filled 2019-03-13 (×3): qty 1

## 2019-03-13 MED ORDER — ONDANSETRON HCL 4 MG/2ML IJ SOLN: 4.0000 mg | Freq: Four times a day (QID) | INTRAMUSCULAR | Status: DC | PRN

## 2019-03-13 NOTE — H&P (Addendum)
[facility] Admission History and Physical Service Pager: [phone]  Patient name: Monique Clark Medical record number: [id number] Date of birth: 1966/03/13 Age: 53 y.o. Gender: female  Primary Care Provider: Lonia Mad, MD Consultants:  Code Status: Full Emergency Contact: Marlaine Hind [phone] (h)   Chief Complaint: Chest pain   Assessment and Plan: Monique Clark is a 53 y.o. female presenting with left sided chest pain. 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  Atypical Chest Pain: Presented with worsening more frequent chest pressure occurring at rest and exertion radiating to left shoulder with associated SOB and dizziness in the setting of elevated blood pressures obtained by patient. Relieved with SL Nitroglycerine. Not reproducible and nonpleuritic. This is new onset for patient starting yesterday that has progressively worsened. No known CAD, but does have history of HTN, HLD, and obesity. Heart score 4. Initial EKG with NSR without signs of ischemia. Troponin <0.03 x2 which is reassurring. Per patient, she had an abnormal stress test in 2016, however no heart cath performed at that time (no records in chart).  Patient endorsed continued chest pressure in ED that relieved with NG. She initially had hypertension but after adjustment of cuff, BP was in normotensive range. Otherwise, hemodynamically stable and afebrile. Unlikely aortic dissection given description of symptoms, stable vitals, and no widened mediastinum on CXR. Additionally, unlikely cardiac tamponade, esophageal rupture, or pheumothorax given lack of tachycardia, tachypnea, or vomiting and stable vitals on room air.  Well  score 0.  No recent infection and symptoms suggesting pericarditis. Patient does have known paroxysmal a-fib currently rate controlled on Diltiazem 120mg  QD. No lipid panel in chart. Currently on ASA 324mg  and Red Yeast Rice 1200mg  QD for cholesterol.  - admit to FPTS, attending Dr. Owens Shark  - continuous cardiac monitoring - cards consulted, follow up recs - Continue home ASA 325mg  QD, Diltiazem 120mg  QD - Troponin now, then q6 hour x 3 - f/u lipid panel, A1C, TSH - calculate ASCVD  - AM EKG - Echo  - tylneol PRN, zofran PRN - heparin SQ - nitro SL 0.4mg  PRN chest pain  Paroxysmal A-fib: Home meds: ASA 325mg  and Diltiazem 120mg  qHS. EKG on admission in NSR. HR 60's on admission. CHADSVASC2: 2. HASBLED: 1.  - continue home meds - consider anticoagulation therapy  HTN: Max BP 142/75 on admission. Home meds: HCTZ 25mg  QD and Diltiazem 120mg  qHS with MagOx 400mg  QD and K-dur 20mg  QD. Last took home meds this AM. - continue home meds  Common variable immunodeficiency: Followed by Dr. Loney Laurence. Home meds: Hizentra: 18g SQ weekly at home. Last injection 6/8, due for next dose evening of 6/9. She notes she can delay dose by 1 day if needed.  - continue at discharge  Pre-Diabetes: Hgb A1c 4.9 in 2019 per patient report. History of A1C of 5.8 in  - repeat A1C  Restless Leg Syndrome  Muscle Cramps  [place]: Home meds: Tizanidine 4mg  qHS, Zolpidem 5mg  qHS, and Pramipexole 0.25mg  qHS - continue Pramipexole 0.25mg  qHS - Melatonin for sleep - continue Tizanidine 4mg  qHS  FEN/GI: Heart healthy Prophylaxis: SQ heparin  Disposition: ACS rule out  History of Present Illness:  Monique Clark is a 53 y.o. female presenting with left sided chest pain. Patient is a Marine scientist on 2-heart.  Patient notes she had more fatigue starting yesterday. She began to feel dizzy starting today. She took her blood pressure and it was really high. 173/110, MAP 130 per patient report. This is abnormal for  her because she normally has controlled blood pressure. Normally ranges in 116-120/60's. She takes HCTZ and Cardizem CR. She notes she developed a mild intermitted chest pressure yesterday which occurred at both rest and exertion. Lasted a few minutes at a time then go away. Nothing in particular made it worse, nothing made it better. Tried taking Advil and tylenol, but didn't think it really helped. Took pepcid thinking maybe it was from acid reflux with no improvement. She notes this feels different. Was able to sleep overnight without problem. Today the chest pain became more constant, especially while her blood pressure was elevated. Experienced some dizziness and nausea with left sided shoulder pain without radiation down arm. Denies any jaw pain or diaphoresis. Notes she currently has a frontal headache but this isnt new for patient. Normally takes tylenol.  She  Notes the chest pain is a "pressure, heaviness". She took ASA 325mg  this AM (which she normally takes for her paroxysmal A-fib and CVA health). She was on 81mg  but was increased to 325mg  in 2018. Denies ever having this chest pain before. Had stress test in ~2016 that patient reports "had some abnormality with decreased signalling." She notes she got very short of breath during her stress test but no chest pain. Had a echo that showed "EF of 55-60%". Was noted to have a-fib in RVR (2016 also) that converted with IV Diltiazem at that time. She was initially started on Bistolic 5mg  but transitioned to Diltiazem in Oct 2019 due to symptomatic bradycardia. She notes daily feeling of a-fib that lasts a few seconds then self-converts.   [place]: A-fib on maternal side. Maternal uncle with heart stents in lat 60's. No other CAD in family.   Social: Drinks socially (2-3x/month). History of tobacco use, 1/2 PPD x 20 years, quit 20 years ago. Denies any other illicit drugs or IV drugs.   Review Of Systems: Per HPI with the following additions  Review of  Systems  Constitutional: Positive for malaise/fatigue. Negative for chills, diaphoresis and fever.  HENT: Negative for congestion, sinus pain and sore throat.   Eyes: Negative for blurred vision and double vision.  Respiratory: Positive for shortness of breath. Negative for cough, hemoptysis and sputum production.   Cardiovascular: Positive for chest pain. Negative for palpitations, orthopnea, claudication and leg swelling.  Gastrointestinal: Positive for nausea. Negative for abdominal pain, blood in stool, constipation, diarrhea, melena and vomiting.  Genitourinary: Negative for dysuria, frequency, hematuria and urgency.    Patient Active Problem List   Diagnosis Date Noted  . BMI 45.0-49.9, adult ([place]) 12/28/2015  . Benign hypertension 12/12/2015  . CVID (common variable immunodeficiency) ([place]) 05/07/2015  . Prediabetes 01/20/2013  . History of non-Hodgkin's lymphoma 12/21/2012  . Low back pain radiating to right lower extremity 12/10/2012  . Right lumbar radiculitis 12/10/2012    Past Medical History: Past Medical History:  Diagnosis Date  . Abscess   . Arthritis   . Atrial fibrillation ([place])   . Cardiac arrhythmia due to congenital heart disease   . CVID (common variable immunodeficiency) ([place]) 2015  . Diverticulosis   . Elevated cholesterol   . High blood pressure   . Non Hodgkin's lymphoma ([place])   . Obesity   . Pneumonia   . UTI (urinary tract infection)  Past Surgical History: Past Surgical History:  Procedure Laterality Date  . ABDOMINAL HYSTERECTOMY    . APPENDECTOMY    . CESAREAN SECTION    . GALLBLADDER SURGERY    . JOINT REPLACEMENT Left    hip  . left shoulder repair    . LUNG REMOVAL, PARTIAL    . myringostomy Bilateral   . SPLENECTOMY, TOTAL    . TONSILLECTOMY AND ADENOIDECTOMY      Social History: Social History   Tobacco Use  . Smoking status: Former Smoker    Packs/day: 0.50    Types: Cigarettes    Quit date: 09/29/2002    Years since  quitting: 16.4  . Smokeless tobacco: Never Used  Substance Use Topics  . Alcohol use: No  . Drug use: No   Additional social history: Drinks socially (2-3x/month). History of tobacco use, 1/2 PPD x 20 years, quit 20 years ago. Denies any other illicit drugs or IV drugs.  Please also refer to relevant sections of EMR.  Family History: No family history on file. A-fib on maternal side. Maternal uncle with heart stents in lat 60's. No other CAD in family.  Allergies and Medications: Allergies  Allergen Reactions  . Nizatidine Hives    Axid  . Promethazine Other (See Comments)    High doses causes confusion  . Tetanus Toxoids     angioedema   Current Facility-Administered Medications on File Prior to Encounter  Medication Dose Route Frequency Provider Last Rate Last Dose  . 0.9 %  sodium chloride infusion  500 mL Intravenous Continuous Irene Shipper, MD       Current Outpatient Medications on File Prior to Encounter  Medication Sig Dispense Refill  . acetaminophen (TYLENOL) 650 MG CR tablet Take 650 mg by mouth every 8 (eight) hours as needed for pain.    Marland Kitchen aspirin 325 MG tablet Take 325 mg by mouth daily.     Marland Kitchen diltiazem (DILTIAZEM CD) 120 MG 24 hr capsule Take 120 mg by mouth at bedtime.    . DULoxetine (CYMBALTA) 30 MG capsule Take 30 mg by mouth daily at 12 noon.    . famotidine (PEPCID) 20 MG tablet Take 20 mg by mouth daily.     . hydrochlorothiazide (HYDRODIURIL) 25 MG tablet Take 25 mg by mouth daily.     . Immune Globulin, Human, (HIZENTRA) 4 GM/20ML SOLN Inject 18 g into the skin once a week. Monday    . magnesium oxide (MAG-OX) 400 MG tablet Take 400 mg by mouth daily.    . Multiple Vitamins-Minerals (MULTIVITAMIN PO) Take 1 tablet by mouth daily at 12 noon.     . potassium chloride (KLOR-CON) 20 MEQ packet Take by mouth 2 (two) times daily.    . potassium chloride SA (K-DUR) 20 MEQ tablet Take 40 mEq by mouth 2 (two) times daily.    . pramipexole (MIRAPEX) 0.25 MG  tablet Take 0.25 mg by mouth at bedtime.    . Red Yeast Rice Extract 600 MG TABS Take 1,200 mg by mouth daily at 12 noon.    Marland Kitchen tiZANidine (ZANAFLEX) 4 MG capsule Take 4 mg by mouth at bedtime.     . Vitamin D, Ergocalciferol, (DRISDOL) 50000 units CAPS capsule Take 50,000 Units by mouth See admin instructions. Bi weekly Tuesday and Thursday    . vitamin E 1000 UNIT capsule Take 1,000 Units by mouth daily.     Marland Kitchen zolpidem (AMBIEN) 5 MG tablet Take 5 mg by mouth at bedtime  as needed.      Objective: BP 127/63   Pulse 69   Resp 13   SpO2 98%  Exam: General: pleasant younger lady, NAD, lying in ED bed, does not appear severely uncomfortable  HEENT: normocephalic, atraumatic, moist mucous membranes Neck: supple, normal ROM CV: regular rate and rhythm without murmurs, rubs, or gallops, no lower extremity edema, 2+ radial and pedal pulses b/l Lungs: clear to auscultation bilaterally with normal work of breathing Abdomen: soft, non-tender, non-distended, normoactive bowel sounds Skin: warm, dry Extremities: warm and well perfused Neuro: Alert and oriented, speech normal  Labs and Imaging: CBC BMET  Recent Labs  Lab 03/13/19 1140  WBC 9.8  HGB 12.5  HCT 40.6  PLT 443*   Recent Labs  Lab 03/13/19 1140  NA 135  K 3.8  CL 101  CO2 23  BUN 22*  CREATININE 0.89  GLUCOSE 90  CALCIUM 9.8     I-stat trop: <0.03 COVID pending  EKG: NSR, QTc 431  Dg Chest 2 View  Result Date: 03/13/2019 CLINICAL DATA:  Dizziness.  Chest discomfort. EXAM: CHEST - 2 VIEW COMPARISON:  None. FINDINGS: The heart size and mediastinal contours are within normal limits. Both lungs are clear. The visualized skeletal structures are unremarkable. IMPRESSION: No active cardiopulmonary disease. Electronically Signed   By: Dorise Bullion III M.D   On: 03/13/2019 12:33   Danna Hefty, DO 03/13/2019, 1:32 PM PGY-1, [place] Intern pager: [phone], text pages welcome  [place]   I have seen and examined this patient.    I have discussed the findings and exam with the intern and agree with the above note, which I have edited appropriately in [place]. I helped develop the management plan that is described in the resident's note, and I agree with the content.   Marny Lowenstein, MD, [place] - PGY2 03/13/2019 7:50 PM

## 2019-03-13 NOTE — ED Notes (Signed)
Patient transported to X-ray 

## 2019-03-13 NOTE — ED Triage Notes (Signed)
Patient complains of dizziness and "chest discomfort" that started last night and got worse this morning, also complains of nausea. Describes discomfort as a pressure, severity 2/10, reports history of hx and afib. Patient reports taking advil this morning with no relief. Patient alert, oriented ,and in no apparent distress at this time.

## 2019-03-13 NOTE — ED Notes (Signed)
Admitting MD at bedside.  Transport delayed for assessment.

## 2019-03-13 NOTE — Consult Note (Signed)
CARDIOLOGY CONSULT NOTE   Referring Physician: Dr. Owens Shark Primary Physician: Dr. Calton Dach Primary Cardiologist: Unknown Reason for Consultation: Chest pain  HPI: Monique Clark is a 53 y.o. female w/ obesity, AF (on aspirin), HTN, CVID on IgG, pre-DM presenting with chest pain.   Patient is a 2H cardiac nurse. States she developed chest pain yesterday.  Never had anything like this before.  Pain located in the central chest with no consistent radiation.  No clear association with exertion.  No other symptoms of shortness of breath, palpitations, syncope, presyncope, PND, orthopnea.  Has been taking all of her medications as prescribed.  Patient is a former smoker but quit over 20 years ago.  Patient came to the ED with the symptoms and was admitted to the medical service for rule out.  Currently, the patient describes no chest pain.  Her ECGs during this admission have been negative for acute ischemia.  Her troponin test has been negative x2.  Patient describes multiple family members with coronary disease including MIs and multiple coronary risk factors.  Review of Systems:     Cardiac Review of Systems: {Y] = yes [ ]  = no  Chest Pain [Y]  Resting SOB [   ] Exertional SOB  [  ]  Orthopnea [  ]   Pedal Edema [   ]    Palpitations [  ] Syncope  [  ]   Presyncope [   ]  General Review of Systems: [Y] = yes [  ]=no Constitional: recent weight change [  ]; anorexia [  ]; fatigue [  ]; nausea [  ]; night sweats [  ]; fever [  ]; or chills [  ];                                                                     Eyes : blurred vision [  ]; diplopia [   ]; vision changes [  ];  Amaurosis fugax[  ]; Resp: cough [  ];  wheezing[  ];  hemoptysis[  ];  PND [  ];  GI:  gallstones[  ], vomiting[  ];  dysphagia[  ]; melena[  ];  hematochezia [  ]; heartburn[  ];   GU: kidney stones [  ]; hematuria[  ];   dysuria [  ];  nocturia[  ]; incontinence [  ];             Skin: rash, swelling[  ];, hair loss[   ];  peripheral edema[  ];  or itching[  ]; Musculosketetal: myalgias[  ];  joint swelling[  ];  joint erythema[  ];  joint pain[  ];  back pain[  ];  Heme/Lymph: bruising[  ];  bleeding[  ];  anemia[  ];  Neuro: TIA[  ];  headaches[  ];  stroke[  ];  vertigo[  ];  seizures[  ];   paresthesias[  ];  difficulty walking[  ];  Psych:depression[  ]; anxiety[  ];  Endocrine: diabetes[  ];  thyroid dysfunction[  ];  Other:  Past Medical History:  Diagnosis Date  . Abscess   . Arthritis   . Atrial fibrillation ([place])   . Cardiac arrhythmia due to congenital heart disease   .  CVID (common variable immunodeficiency) ([place]) 2015  . Diverticulosis   . Elevated cholesterol   . High blood pressure   . Non Hodgkin's lymphoma ([place])   . Obesity   . Pneumonia   . UTI (urinary tract infection)     Facility-Administered Medications Prior to Admission  Medication Dose Route Frequency Provider Last Rate Last Dose  . 0.9 %  sodium chloride infusion  500 mL Intravenous Continuous Irene Shipper, MD       Medications Prior to Admission  Medication Sig Dispense Refill  . acetaminophen (TYLENOL) 650 MG CR tablet Take 650 mg by mouth every 8 (eight) hours as needed for pain.    Marland Kitchen aspirin 325 MG tablet Take 325 mg by mouth daily.     Marland Kitchen diltiazem (DILTIAZEM CD) 120 MG 24 hr capsule Take 120 mg by mouth at bedtime.    . DULoxetine (CYMBALTA) 30 MG capsule Take 30 mg by mouth daily at 12 noon.    . famotidine (PEPCID) 20 MG tablet Take 20 mg by mouth daily.     . hydrochlorothiazide (HYDRODIURIL) 25 MG tablet Take 25 mg by mouth daily.     . Immune Globulin, Human, (HIZENTRA) 4 GM/20ML SOLN Inject 18 g into the skin once a week. Monday    . magnesium oxide (MAG-OX) 400 MG tablet Take 400 mg by mouth daily.    . Multiple Vitamins-Minerals (MULTIVITAMIN PO) Take 1 tablet by mouth daily at 12 noon.     . potassium chloride (KLOR-CON) 20 MEQ packet Take by mouth 2 (two) times daily.    . potassium chloride SA  (K-DUR) 20 MEQ tablet Take 40 mEq by mouth 2 (two) times daily.    . pramipexole (MIRAPEX) 0.25 MG tablet Take 0.25 mg by mouth at bedtime.    . Red Yeast Rice Extract 600 MG TABS Take 1,200 mg by mouth daily at 12 noon.    Marland Kitchen tiZANidine (ZANAFLEX) 4 MG capsule Take 4 mg by mouth at bedtime.     . Vitamin D, Ergocalciferol, (DRISDOL) 50000 units CAPS capsule Take 50,000 Units by mouth See admin instructions. Bi weekly Tuesday and Thursday    . vitamin E 1000 UNIT capsule Take 1,000 Units by mouth daily.     Marland Kitchen zolpidem (AMBIEN) 5 MG tablet Take 5 mg by mouth at bedtime as needed.       Derrill Memo ON 03/14/2019] aspirin  325 mg Oral Daily  . diltiazem  120 mg Oral QHS  . [START ON 03/14/2019] DULoxetine  30 mg Oral Q1200  . [START ON 03/14/2019] famotidine  20 mg Oral Daily  . heparin  5,000 Units Subcutaneous Q8H  . [START ON 03/14/2019] hydrochlorothiazide  25 mg Oral Daily  . [START ON 03/14/2019] magnesium oxide  400 mg Oral Daily  . potassium chloride SA  40 mEq Oral BID  . pramipexole  0.25 mg Oral QHS  . tiZANidine  4 mg Oral QHS    Infusions:   Allergies  Allergen Reactions  . Nizatidine Hives    Axid  . Promethazine Other (See Comments)    High doses causes confusion  . Tetanus Toxoids     angioedema    Social History   Socioeconomic History  . Marital status: Married    Spouse name: Not on file  . Number of children: 3  . Years of education: Not on file  . Highest education level: Not on file  Occupational History  . Occupation: rn  Social Needs  .  Financial resource strain: Not on file  . Food insecurity    Worry: Not on file    Inability: Not on file  . Transportation needs    Medical: Not on file    Non-medical: Not on file  Tobacco Use  . Smoking status: Former Smoker    Packs/day: 0.50    Types: Cigarettes    Quit date: 09/29/2002    Years since quitting: 16.4  . Smokeless tobacco: Never Used  Substance and Sexual Activity  . Alcohol use: No  . Drug  use: No  . Sexual activity: Not on file  Lifestyle  . Physical activity    Days per week: Not on file    Minutes per session: Not on file  . Stress: Not on file  Relationships  . Social Herbalist on phone: Not on file    Gets together: Not on file    Attends religious service: Not on file    Active member of club or organization: Not on file    Attends meetings of clubs or organizations: Not on file    Relationship status: Not on file  . Intimate partner violence    Fear of current or ex partner: Not on file    Emotionally abused: Not on file    Physically abused: Not on file    Forced sexual activity: Not on file  Other Topics Concern  . Not on file  Social History Narrative  . Not on file    No family history on file.  PHYSICAL EXAM: Vitals:   03/13/19 1600 03/13/19 2000  BP: 113/72   Pulse: 64 76  Resp: 19 (!) 21  Temp:    SpO2: 99% 97%     Intake/Output Summary (Last 24 hours) at 03/13/2019 2151 Last data filed at 03/13/2019 2000 Gross per 24 hour  Intake 743 ml  Output -  Net 743 ml    General:  Well appearing. No respiratory difficulty HEENT: normal Neck: supple. no JVD. Carotids 2+ bilat; no bruits. No lymphadenopathy or thryomegaly appreciated. Cor: PMI nondisplaced. Regular rate & rhythm. No rubs, gallops or murmurs. Lungs: clear Abdomen: soft, nontender, nondistended. No hepatosplenomegaly. No bruits or masses. Good bowel sounds. Extremities: no cyanosis, clubbing, rash, edema Neuro: alert & oriented x 3, cranial nerves grossly intact. moves all 4 extremities w/o difficulty. Affect pleasant.  ECG: NSR, normal axis, normal intervals, normal wave morphologies, no ST or T wave abnormalities, normal ECG  Results for orders placed or performed during the hospital encounter of 03/13/19 (from the past 24 hour(s))  Basic metabolic panel     Status: Abnormal   Collection Time: 03/13/19 11:40 AM  Result Value Ref Range   Sodium 135 135 - 145  mmol/L   Potassium 3.8 3.5 - 5.1 mmol/L   Chloride 101 98 - 111 mmol/L   CO2 23 22 - 32 mmol/L   Glucose, Bld 90 70 - 99 mg/dL   BUN 22 (H) 6 - 20 mg/dL   Creatinine, Ser 0.89 0.44 - 1.00 mg/dL   Calcium 9.8 8.9 - 10.3 mg/dL   GFR calc non Af Amer >60 >60 mL/min   GFR calc Af Amer >60 >60 mL/min   Anion gap 11 5 - 15  CBC     Status: Abnormal   Collection Time: 03/13/19 11:40 AM  Result Value Ref Range   WBC 9.8 4.0 - 10.5 K/uL   RBC 4.62 3.87 - 5.11 MIL/uL   Hemoglobin 12.5 12.0 -  15.0 g/dL   HCT 40.6 36.0 - 46.0 %   MCV 87.9 80.0 - 100.0 fL   MCH 27.1 26.0 - 34.0 pg   MCHC 30.8 30.0 - 36.0 g/dL   RDW 15.6 (H) 11.5 - 15.5 %   Platelets 443 (H) 150 - 400 K/uL   nRBC 0.0 0.0 - 0.2 %  Troponin I - ONCE - STAT     Status: None   Collection Time: 03/13/19 11:40 AM  Result Value Ref Range   Troponin I <0.03 <0.03 ng/mL  Hemoglobin A1c     Status: None   Collection Time: 03/13/19  3:27 PM  Result Value Ref Range   Hgb A1c MFr Bld 5.3 4.8 - 5.6 %   Mean Plasma Glucose 105.41 mg/dL  Protime-INR     Status: None   Collection Time: 03/13/19  3:27 PM  Result Value Ref Range   Prothrombin Time 13.1 11.4 - 15.2 seconds   INR 1.0 0.8 - 1.2  TSH     Status: None   Collection Time: 03/13/19  3:27 PM  Result Value Ref Range   TSH 1.430 0.350 - 4.500 uIU/mL  Magnesium     Status: None   Collection Time: 03/13/19  3:27 PM  Result Value Ref Range   Magnesium 1.8 1.7 - 2.4 mg/dL  Troponin I - Now Then Q6H     Status: None   Collection Time: 03/13/19  3:27 PM  Result Value Ref Range   Troponin I <0.03 <0.03 ng/mL  Lipid panel     Status: Abnormal   Collection Time: 03/13/19  3:27 PM  Result Value Ref Range   Cholesterol 222 (H) 0 - 200 mg/dL   Triglycerides 63 <150 mg/dL   HDL 59 >40 mg/dL   Total CHOL/HDL Ratio 3.8 RATIO   VLDL 13 0 - 40 mg/dL   LDL Cholesterol 150 (H) 0 - 99 mg/dL   Dg Chest 2 View  Result Date: 03/13/2019 CLINICAL DATA:  Dizziness.  Chest discomfort. EXAM:  CHEST - 2 VIEW COMPARISON:  None. FINDINGS: The heart size and mediastinal contours are within normal limits. Both lungs are clear. The visualized skeletal structures are unremarkable. IMPRESSION: No active cardiopulmonary disease. Electronically Signed   By: Dorise Bullion III M.D   On: 03/13/2019 12:33   ASSESSMENT: Monique Clark is a 53 y.o. female w/ obesity, AF (on aspirin), HTN, CVID on IgG, pre-DM presenting with chest pain. Several atypical features to her symptoms, but she does have multiple cardiovascular risk factors. She has had negative troponin x 2 and has a normal ECG. She is a good candidate for non-invasive workup such as coronary CTA.   PLAN/DISCUSSION: - await echo results  - coronary CTA w/ CT-FFR ordered - patient should be counseled regarding lifestyle modification for primary prevention of CVD (weight loss and dietary modification) - 10-year ASCVD risk 1.8%  Marcie Mowers, MD Cardiology Fellow, PGY-6

## 2019-03-13 NOTE — ED Provider Notes (Addendum)
[facility] EMERGENCY DEPARTMENT Provider Note   CSN: [id number] Arrival date & time: 03/13/19  1038     History   Chief Complaint Chief Complaint  Patient presents with   Chest Pain    HPI Monique Clark is a 53 y.o. female.     HPI  Patient is a 53 year old female past medical history of atrial fibrillation on ASA 325 mg daily, CVI D, remote history of non-Hodgkin's lymphoma 20 years ago, obesity, hypercholesterolemia, hypertension presenting for left-sided chest pain and "dizziness".  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  Does not radiate to the back.  She reports that she woke up feeling "dizzy" and describes this as a lightheaded feeling.  Denies vertigo, diplopia, or blurred vision.  Patient reports that she has a history of orthostatic hypertension and checked her blood pressure today and it was slightly elevated at the time she explains her symptoms 2 to 3 hours ago.  Patient reports a mild shortness of breath but denies pleuritic chest pain.  She reports mild nausea but denies diaphoresis or vomiting.  Patient denies any recent upper respiratory infections, close COVID-19 exposures.  Patient reports a family history of multiple first-degree relatives with atrial fibrillation in her mother's family.  Patient paternal grandfather had ischemic heart disease in his 60s.  Patient denies history of MI.  She does follow with a cardiologist in [facility] for her atrial fibrillation.  Patient had a stress test approximately 4 years ago and thinks it may have had some abnormalities but did not get a cath. No history of ischemic heart disease.  She denies any recent mobilization, hospitalization, hormone use, cancer treatment, history DVT/PE,  recent surgery, hemoptysis or unilateral calf TTP.   Past Medical History:  Diagnosis Date   Abscess    Arthritis    Atrial fibrillation ([place])    Cardiac arrhythmia due to congenital heart disease    CVID (common variable immunodeficiency) ([place]) 2015   Diverticulosis    Elevated cholesterol    High blood pressure    Non Hodgkin's lymphoma ([place])    Obesity    Pneumonia    UTI (urinary tract infection)     Patient Active Problem List   Diagnosis Date Noted   BMI 45.0-49.9, adult ([place]) 12/28/2015   Benign hypertension 12/12/2015   CVID (common variable immunodeficiency) ([place]) 05/07/2015   Prediabetes 01/20/2013   History of non-Hodgkin's lymphoma 12/21/2012   Low back pain radiating to right lower extremity 12/10/2012   Right lumbar radiculitis 12/10/2012    Past Surgical History:  Procedure Laterality Date   ABDOMINAL HYSTERECTOMY     APPENDECTOMY     CESAREAN SECTION     GALLBLADDER SURGERY     JOINT REPLACEMENT Left    hip   left shoulder repair     LUNG REMOVAL, PARTIAL     myringostomy Bilateral    SPLENECTOMY, TOTAL     TONSILLECTOMY AND ADENOIDECTOMY       OB History   No obstetric history on file.      Home Medications    Prior to Admission medications   Medication Sig Start Date End Date Taking? Authorizing Provider  aspirin 325 MG tablet Take by mouth daily. 07/25/14   [provider]  BYSTOLIC 10 MG tablet  6/38/75   [provider]  carvedilol (COREG) 25 MG tablet TAKE 1 TABLET BY MOUTH TWICE A DAY FOR BLOOD PRESSURE 01/20/17   [provider]  famotidine (PEPCID) 20 MG tablet Take by mouth. 07/25/14   [provider]  Ferrous Gluconate-C-Folic Acid (IRON-C PO) Take by mouth daily.    [provider]  human chorionic gonadotropin (PREGNYL/NOVAREL) 10000 units injection Inject 10,000 Units into the muscle once.    [provider]  hydrochlorothiazide (HYDRODIURIL) 25  MG tablet Take by mouth.    [provider]  Immune Globulin, Human, (HIZENTRA) 4 GM/20ML SOLN Inject into the skin. 05/03/15   [provider]  meloxicam (MOBIC) 15 MG tablet Take by mouth. 01/29/17   [provider]  Multiple Vitamins-Minerals (MULTIVITAMIN PO) Take by mouth.    [provider]  nebivolol (BYSTOLIC) 5 MG tablet Take by mouth.    [provider]  oxyCODONE (OXY IR/ROXICODONE) 5 MG immediate release tablet  07/30/18   [provider]  phentermine (ADIPEX-P) 37.5 MG tablet Take 37.5 mg by mouth daily. 02/04/17   [provider]  tiZANidine (ZANAFLEX) 2 MG tablet Take by mouth daily as needed. 03/04/17   [provider]  traMADol (ULTRAM) 50 MG tablet Take 1 tablet (50 mg total) by mouth every 6 (six) hours as needed. Patient not taking: Reported on 08/05/2018 08/19/17   Ok Edwards, PA-C  Vitamin D, Ergocalciferol, (DRISDOL) 50000 units CAPS capsule Take by mouth. 01/29/17   [provider]  vitamin E 400 UNIT capsule Take by mouth.    [provider]    Family History No family history on file.  Social History Social History   Tobacco Use   Smoking status: Former Smoker    Packs/day: 0.50    Types: Cigarettes    Quit date: 09/29/2002    Years since quitting: 16.4   Smokeless tobacco: Never Used  Substance Use Topics   Alcohol use: No   Drug use: No     Allergies   Tetanus toxoids   Review of Systems Review of Systems  Constitutional: Negative for chills and fever.  HENT: Negative for congestion, rhinorrhea, sinus pain and sore throat.   Eyes: Negative for visual disturbance.  Respiratory: Positive for chest tightness. Negative for cough and shortness of breath.   Cardiovascular: Positive for chest pain and palpitations. Negative for leg swelling.  Gastrointestinal: Positive for nausea. Negative for abdominal pain and vomiting.  Genitourinary: Negative for dysuria and flank pain.   Musculoskeletal: Negative for back pain and myalgias.  Skin: Negative for rash.  Neurological: Positive for light-headedness. Negative for dizziness, syncope and headaches.     Physical Exam Updated Vital Signs There were no vitals taken for this visit.  Physical Exam Vitals signs and nursing note reviewed.  Constitutional:      General: She is not in acute distress.    Appearance: She is well-developed. She is obese. She is not ill-appearing or diaphoretic.  HENT:     Head: Normocephalic and atraumatic.  Eyes:     Conjunctiva/sclera: Conjunctivae normal.     Pupils: Pupils are equal, round, and reactive to light.     Comments: Normal conjunctiva.   Neck:     Musculoskeletal: Normal range of motion and neck supple.  Cardiovascular:     Rate and Rhythm: Normal rate and regular rhythm.     Pulses:  Radial pulses are 2+ on the right side and 2+ on the left side.       Dorsalis pedis pulses are 2+ on the right side and 2+ on the left side.     Heart sounds: S1 normal and S2 normal. No murmur.  Pulmonary:     Effort: Pulmonary effort is normal.     Breath sounds: Normal breath sounds. No wheezing or rales.  Abdominal:     General: There is no distension.     Palpations: Abdomen is soft.     Tenderness: There is no abdominal tenderness. There is no guarding.  Musculoskeletal: Normal range of motion.        General: No deformity.  Lymphadenopathy:     Cervical: No cervical adenopathy.  Skin:    General: Skin is warm and dry.     Findings: No erythema or rash.  Neurological:     Mental Status: She is alert.     Comments: Cranial nerves grossly intact. Patient moves extremities symmetrically and with good coordination.  Psychiatric:        Behavior: Behavior normal.        Thought Content: Thought content normal.        Judgment: Judgment normal.      ED Treatments / Results  Labs (all labs ordered are listed, but only abnormal results are displayed) Labs  Reviewed  BASIC METABOLIC PANEL  CBC  TROPONIN I    EKG EKG Interpretation  Date/Time:  Sunday March 13 2019 10:51:09 EDT Ventricular Rate:  62 PR Interval:    QRS Duration: 101 QT Interval:  424 QTC Calculation: 431 R Axis:   25 Text Interpretation:  Sinus rhythm Low voltage, precordial leads No old tracing to compare Confirmed by Jola Schmidt [phone]) on 03/13/2019 11:03:56 AM   Radiology No results found.  Procedures Procedures (including critical care time)  Medications Ordered in ED Medications  sodium chloride flush (NS) 0.9 % injection 3 mL (has no administration in time range)     Initial Impression / Assessment and Plan / ED Course  I have reviewed the triage vital signs and the nursing notes.  Pertinent labs & imaging results that were available during my care of the patient were reviewed by me and considered in my medical decision making (see chart for details).  Clinical Course as of Mar 12 1333  Sun Mar 13, 2019  1117 Pt took ASA 325mg  as a chronic medication.    [AM]  1334 Spoke with Dr. Maricela Bo who will admit patient. Appreciate her involvement.    [AM]    Clinical Course User Index [AM] Albesa Seen, PA-C       Differential diagnosis includes ACS, PE, thoracic aortic dissection, Boerhaave's syndrome, cardiac tamponade, pneumothorax, incarcerated diaphragmatic hernia, cholecystitis, esophageal spasm, gastroesophageal reflux, herpes zoster of the thorax, pericarditis, pneumonia, chest wall pain, costochondritis.   Pt does have risk factors for ACS (HTN, HLD, obesity) with a heart score of 4.  Initial EKG normal sinus rhythm without acute evidence of ischemia, infarction, or arrhythmia.  Doubt PE as Well's score 0 but  PERC point for age only, and patient not tachycardic. Doubt TAD by hx, CXR showed no widening mediastinum, and pulses equal in all extremities. Patient remained nontoxic appearing and in no acute distress during emergency department  course but did continue to feel that she was lightheaded. Vital signs stable in the emergency department.  Patient 1 hypertensive reading found to be due to inappropriate cuff  placement.  This was rechecked and was normotensive.  Therefore, doubt esophageal rupture, cardiac tamponade, or pneumothorax. Pericarditis less likely due to no preceding infectious symptoms and pain not improved in upright positions. Abnormal labs include slight elevation in BUN. Due to risk of MACE with concerning story for ischemic heard disease and heart score of 4, admission is advised at this time.    Patient and family understand and are in agreement with plan of care.  This is a supervised visit with Dr. Jola Schmidt. Evaluation, management, and disposition planning discussed with this attending physician.  Final Clinical Impressions(s) / ED Diagnoses   Final diagnoses:  Precordial pain  Lightheadedness    ED Discharge Orders    None       Tamala Julian 03/13/19 1334    Tamala Julian 03/13/19 1335    Jola Schmidt, MD 03/13/19 1458

## 2019-03-13 NOTE — ED Notes (Signed)
ED Provider at bedside. 

## 2019-03-14 ENCOUNTER — Encounter (HOSPITAL_COMMUNITY): Payer: Self-pay | Admitting: *Deleted

## 2019-03-14 ENCOUNTER — Observation Stay: Payer: Self-pay

## 2019-03-14 ENCOUNTER — Observation Stay (HOSPITAL_BASED_OUTPATIENT_CLINIC_OR_DEPARTMENT_OTHER): Payer: 59

## 2019-03-14 ENCOUNTER — Observation Stay (HOSPITAL_COMMUNITY): Payer: 59

## 2019-03-14 ENCOUNTER — Other Ambulatory Visit: Payer: Self-pay

## 2019-03-14 DIAGNOSIS — Z1159 Encounter for screening for other viral diseases: Secondary | ICD-10-CM | POA: Diagnosis not present

## 2019-03-14 DIAGNOSIS — R0789 Other chest pain: Secondary | ICD-10-CM | POA: Diagnosis not present

## 2019-03-14 DIAGNOSIS — R609 Edema, unspecified: Secondary | ICD-10-CM | POA: Diagnosis not present

## 2019-03-14 DIAGNOSIS — M7989 Other specified soft tissue disorders: Secondary | ICD-10-CM | POA: Diagnosis not present

## 2019-03-14 DIAGNOSIS — R2242 Localized swelling, mass and lump, left lower limb: Secondary | ICD-10-CM | POA: Diagnosis not present

## 2019-03-14 DIAGNOSIS — R072 Precordial pain: Secondary | ICD-10-CM | POA: Diagnosis not present

## 2019-03-14 DIAGNOSIS — I480 Paroxysmal atrial fibrillation: Secondary | ICD-10-CM

## 2019-03-14 DIAGNOSIS — E669 Obesity, unspecified: Secondary | ICD-10-CM | POA: Diagnosis not present

## 2019-03-14 DIAGNOSIS — R7303 Prediabetes: Secondary | ICD-10-CM | POA: Diagnosis not present

## 2019-03-14 DIAGNOSIS — R079 Chest pain, unspecified: Secondary | ICD-10-CM

## 2019-03-14 DIAGNOSIS — Z7982 Long term (current) use of aspirin: Secondary | ICD-10-CM | POA: Diagnosis not present

## 2019-03-14 DIAGNOSIS — Z79899 Other long term (current) drug therapy: Secondary | ICD-10-CM | POA: Diagnosis not present

## 2019-03-14 DIAGNOSIS — R42 Dizziness and giddiness: Secondary | ICD-10-CM | POA: Diagnosis not present

## 2019-03-14 DIAGNOSIS — I10 Essential (primary) hypertension: Secondary | ICD-10-CM | POA: Diagnosis not present

## 2019-03-14 DIAGNOSIS — E785 Hyperlipidemia, unspecified: Secondary | ICD-10-CM | POA: Diagnosis not present

## 2019-03-14 LAB — TROPONIN I: Troponin I: <0.03 ng/mL (ref <0.03)

## 2019-03-14 LAB — CBC
HCT: 37.5 % (ref 36.0–46.0)
Hemoglobin: 11.7 g/dL — ABNORMAL LOW (ref 12.0–15.0)
MCH: 27 pg (ref 26.0–34.0)
MCHC: 31.2 g/dL (ref 30.0–36.0)
MCV: 86.6 fL (ref 80.0–100.0)
Platelets: 430 10*3/uL — ABNORMAL HIGH (ref 150–400)
RBC: 4.33 MIL/uL (ref 3.87–5.11)
RDW: 15.3 % (ref 11.5–15.5)
WBC: 9.6 10*3/uL (ref 4.0–10.5)
nRBC: 0 % (ref 0.0–0.2)

## 2019-03-14 LAB — BASIC METABOLIC PANEL
Anion gap: 9 (ref 5–15)
BUN: 22 mg/dL — ABNORMAL HIGH (ref 6–20)
CO2: 27 mmol/L (ref 22–32)
Calcium: 9.4 mg/dL (ref 8.9–10.3)
Chloride: 103 mmol/L (ref 98–111)
Creatinine, Ser: 0.98 mg/dL (ref 0.44–1.00)
GFR calc Af Amer: >60 mL/min (ref >60)
GFR calc non Af Amer: >60 mL/min (ref >60)
Glucose, Bld: 104 mg/dL — ABNORMAL HIGH (ref 70–99)
Potassium: 4.2 mmol/L (ref 3.5–5.1)
Sodium: 139 mmol/L (ref 135–145)

## 2019-03-14 LAB — MRSA PCR SCREENING: MRSA by PCR: NEGATIVE

## 2019-03-14 LAB — NOVEL CORONAVIRUS, NAA (HOSP ORDER, SEND-OUT TO REF LAB; TAT 18-24 HRS): SARS-CoV-2, NAA: NOT DETECTED

## 2019-03-14 LAB — HIV ANTIBODY (ROUTINE TESTING W REFLEX): HIV Screen 4th Generation wRfx: NONREACTIVE

## 2019-03-14 MED ORDER — ZOLPIDEM TARTRATE 5 MG PO TABS
5.0000 mg | ORAL_TABLET | Freq: Every evening | ORAL | Status: DC | PRN
  Administered 2019-03-14 – 2019-03-15 (×2): 5 mg via ORAL
  Filled 2019-03-14 (×2): qty 1

## 2019-03-14 MED ORDER — SODIUM CHLORIDE 0.9% FLUSH
10.0000 mL | Freq: Two times a day (BID) | INTRAVENOUS | Status: DC
  Administered 2019-03-14 – 2019-03-15 (×3): 10 mL

## 2019-03-14 MED ORDER — NITROGLYCERIN 0.4 MG SL SUBL
SUBLINGUAL_TABLET | SUBLINGUAL | Status: AC
  Administered 2019-03-14: 14:00:00 0.8 mg via SUBLINGUAL
  Filled 2019-03-14: qty 2

## 2019-03-14 MED ORDER — IOHEXOL 350 MG/ML SOLN
80.0000 mL | Freq: Once | INTRAVENOUS | Status: AC | PRN
  Administered 2019-03-14: 80 mL via INTRAVENOUS

## 2019-03-14 MED ORDER — SODIUM CHLORIDE 0.9% FLUSH: 10.0000 mL | INTRAVENOUS | Status: DC | PRN

## 2019-03-14 NOTE — Progress Notes (Signed)
Spoke with patient's nurse and made her aware that the PICC order needs to be entered correctly and that it may not be placed until 03/15/19.

## 2019-03-14 NOTE — Progress Notes (Signed)
Progress Note  Patient Name: Monique Clark Date of Encounter: 03/14/2019  Primary Cardiologist: New Primary MD Dr Calton Dach    Subjective   Pt still with some mild chest discomfort   Breathing OK Inpatient Medications    Scheduled Meds: . aspirin  325 mg Oral Daily  . diltiazem  120 mg Oral QHS  . DULoxetine  30 mg Oral Q1200  . famotidine  20 mg Oral Daily  . heparin  5,000 Units Subcutaneous Q8H  . hydrochlorothiazide  25 mg Oral Daily  . magnesium oxide  400 mg Oral Daily  . potassium chloride SA  40 mEq Oral BID  . pramipexole  0.25 mg Oral QHS  . sodium chloride flush  10-40 mL Intracatheter Q12H  . tiZANidine  4 mg Oral QHS   Continuous Infusions:  PRN Meds: acetaminophen, Melatonin, nitroGLYCERIN, ondansetron (ZOFRAN) IV, sodium chloride flush   Vital Signs    Vitals:   03/13/19 2000 03/13/19 2207 03/14/19 0000 03/14/19 0400  BP:  107/72    Pulse: 76  (!) 57 62  Resp: (!) 21  16 (!) 22  Temp:      TempSrc:      SpO2: 97%  97% 96%  Weight:      Height:        Intake/Output Summary (Last 24 hours) at 03/14/2019 0729 Last data filed at 03/13/2019 2000 Gross per 24 hour  Intake 743 ml  Output -  Net 743 ml   Last 3 Weights 03/13/2019 05/12/2017 03/10/2017  Weight (lbs) 283 lb 15.2 oz 284 lb 284 lb  Weight (kg) 128.8 kg 128.822 kg 128.822 kg      Telemetry    sR - Personally Reviewed  ECG    No new EKG- Personally Reviewed  Physical Exam   GEN: Morbidly obese 53 yo acute distress.   Neck: No JVD Cardiac: RRR, no murmurs, rubs, or gallops.  Chest  Nontender Respiratory: Clear to auscultation bilaterally. GI: Soft, nontender, non-distended  MS: No edema; No deformity. Neuro:  Nonfocal  Psych: Normal affect   Labs    Chemistry Recent Labs  Lab 03/13/19 1140 03/14/19 0208  NA 135 139  K 3.8 4.2  CL 101 103  CO2 23 27  GLUCOSE 90 104*  BUN 22* 22*  CREATININE 0.89 0.98  CALCIUM 9.8 9.4  GFRNONAA >60 >60  GFRAA >60 >60  ANIONGAP  11 9     Hematology Recent Labs  Lab 03/13/19 1140 03/14/19 0208  WBC 9.8 9.6  RBC 4.62 4.33  HGB 12.5 11.7*  HCT 40.6 37.5  MCV 87.9 86.6  MCH 27.1 27.0  MCHC 30.8 31.2  RDW 15.6* 15.3  PLT 443* 430*    Cardiac Enzymes Recent Labs  Lab 03/13/19 1140 03/13/19 1527 03/13/19 2157 03/14/19 0208  TROPONINI <0.03 <0.03 <0.03 <0.03   No results for input(s): TROPIPOC in the last 168 hours.   BNPNo results for input(s): BNP, PROBNP in the last 168 hours.   DDimer No results for input(s): DDIMER in the last 168 hours.   Radiology    Dg Chest 2 View  Result Date: 03/13/2019 CLINICAL DATA:  Dizziness.  Chest discomfort. EXAM: CHEST - 2 VIEW COMPARISON:  None. FINDINGS: The heart size and mediastinal contours are within normal limits. Both lungs are clear. The visualized skeletal structures are unremarkable. IMPRESSION: No active cardiopulmonary disease. Electronically Signed   By: Dorise Bullion III M.D   On: 03/13/2019 12:33    Cardiac Studies  Cardiac CT pending Echo ordered  Patient Profile     53 y.o. female *Hx of afib, HTN, CVID, prediabetes admitted with CP  Assessment & Plan    1  CP  Hx is somewhat atypical but concerning .   Pt has risk factors for CAD (prediabetes, morbid obesity) I agree with plans for CT angiogram.    Echo is pending Would check orthostatic vital signs as pt notes some change with standing with hypertesion and increased HR  2  Lipids   LDL 150   HDL 59  Pt on red yeast rice   Await results of CT scan to discuss further    For questions or updates, please contact [facility] Please consult [email or web address] for contact info under        Signed, Dorris Carnes, MD  03/14/2019, 7:29 AM

## 2019-03-14 NOTE — Significant Event (Signed)
For 6/15 1900 to 6/16 at 0700, please page [phone].  Pager [phone] is not working.  [place] Constable, D.O.  PGY-1 Family Medicine  03/14/2019 9:56 PM

## 2019-03-14 NOTE — Progress Notes (Signed)
Patient ID: Monique Clark, female   DOB: 1966/04/21, 53 y.o.   MRN: [id number]   Called to CT1 100 cc contrast extravasation to right upper arm  Pt says area is sl tender Some swelling is noted compared to other arm No redness Skin intact  Good ROM 2+ pulses Skin with good cap refill  No numbness per pt  Will follow and see tomorrow Keep elevated and ice to area Orders placed

## 2019-03-14 NOTE — Progress Notes (Addendum)
Family Medicine Teaching Service Daily Progress Note Intern Pager: [phone]  Patient name: Monique Clark Medical record number: [id number] Date of birth: Mar 18, 1966 Age: 53 y.o. Gender: female  Primary Care Provider: Lonia Mad, MD Consultants: Cardiology Code Status: Full  Pt Overview and Major Events to Date:  6/14 Admit to FPTS for ACS rule out  Assessment and Plan: Monique Clark is a 53 y.o. female presenting with left sided chest pain. 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  Atypical Chest Pain: Troponin neg x 3 and repeat EKG with NSR without ST changes. Cards consulted with plan for coronary CTA with CT-FFR. Echo pending. Notes some chest heaviness this morning but denies any nausea, dizziness, or SOB. Has remained hemodynamically stale and afebrile. Her 10-year ASCVD risk score is: 1.6%. TSH WNL and A1C 5.3. Patient not open to starting statin, would like to continue Red Yeast Rice.  - cards consulted, appreciate recs - follow up echo - follow up CTA - Continue home ASA 325mg  QD, Diltiazem 120mg  QD - tylneol PRN, zofran PRN - nitro SL 0.4mg  PRN chest pain - encourage lifestyle modifications  Left LE swelling: Patient endorsed mild swelling of left LE that is new for her. There is mild enlargement of left calf to right calf on exam. Nontender to palpation.  - follow up left LE doppler  Paroxysmal A-fib: Home meds: ASA 325mg  and Diltiazem 120mg  qHS. EKG with NSR. HR 60-80. HASBLED 1 and CHADSVASC2: 2. - continue home meds - consider anticoagulation therapy   HTN: BP 601/09 this AM. Systollically 323-557'D overnight. Home meds: HCTZ 25mg  QD and Diltiazem 120mg  qHS with MagOx 400mg  QD and K-dur 20mg   QD - continue home meds  Common variable immunodeficiency: Followed by Dr. Loney Laurence. Home meds: Hizentra: 18g SQ weekly at home. Last injection 6/8, due for next dose evening of 6/9. She notes she can delay dose by 1 day if needed.  - continue at discharge  Pre-Diabetes: Hgb A1c 5.3. - encourage life style modifications   Restless Leg Syndrome  Muscle Cramps  [place]: Home meds: Tizanidine 4mg  qHS, Zolpidem 5mg  qHS, and Pramipexole 0.25mg  qHS - continue Pramipexole 0.25mg  qHS - Melatonin for sleep - continue Tizanidine 4mg  qHS  FEN/GI: Heart healthy Prophylaxis: SQ heparin  Disposition: home pending CTA results  Subjective:  Patient doing well overnight. She slept okay not having her ambien. Does endorse some chest heaviness this AM but is holding off on nitro given she is going back for her procedure soon.   Objective: Temp:  [97.7 F (36.5 C)-97.8 F (36.6 C)] 97.8 F (36.6 C) (06/14 1515) Pulse Rate:  [57-80] 68 (06/15 0749) Resp:  [12-24] 14 (06/15 0749) BP: (88-142)/(51-89) 110/65 (06/15 0749) SpO2:  [95 %-100 %] 100 % (06/15 0749) Weight:  [128.8 kg] 128.8 kg (06/14 1600) Physical Exam: General: pleasant lady, NAD, lying comfortably in bed this AM CV: regular rate and rhythm without murmurs, rubs, or gallops, 2+ radial and pedal pulses bilaterally Lungs: clear to auscultation bilaterally with normal work of breathing Abdomen: soft, non-tender, non-distended, normoactive bowel sounds Skin: warm, dry Extremities: warm and well perfused, left calf measured slightly larger than right when using hands to measure circumference, non-tender to palpation  Laboratory: Recent Labs  Lab 03/13/19 1140 03/14/19 0208  WBC 9.8 9.6  HGB 12.5 11.7*  HCT 40.6 37.5  PLT 443* 430*   Recent Labs  Lab 03/13/19 1140 03/14/19 0208  NA 135 139  K 3.8 4.2  CL 101 103  CO2 23 27  BUN 22* 22*  CREATININE 0.89 0.98  CALCIUM 9.8 9.4  GLUCOSE 90 104*   A1C  5.3 PT/INR: 13.1/1.0 TSH: 1.430 Mag 1.8 Trop neg x 3 Lipid panel: Chol 222, Trig 63, HDL 59, LDL 150  Imaging/Diagnostic Tests: Dg Chest 2 View  Result Date: 03/13/2019 CLINICAL DATA:  Dizziness.  Chest discomfort. EXAM: CHEST - 2 VIEW COMPARISON:  None. FINDINGS: The heart size and mediastinal contours are within normal limits. Both lungs are clear. The visualized skeletal structures are unremarkable. IMPRESSION: No active cardiopulmonary disease. Electronically Signed   By: Dorise Bullion III M.D   On: 03/13/2019 12:33    Danna Hefty, DO 03/14/2019, 9:18 AM PGY-1, [place] Intern pager: [phone], text pages welcome

## 2019-03-14 NOTE — Discharge Summary (Signed)
[facility] Discharge Summary  Patient name: Monique Clark Medical record number: [id number] Date of birth: [birth year] Age: 53 y.o. Gender: female Date of Admission: 03/13/2019  Date of Discharge: 03/16/2019 Admitting Physician: Martyn Malay, MD  Primary Care Provider: Lonia Mad, MD Consultants: Cardiology  Indication for Hospitalization: chest pain  Discharge Diagnoses/Problem List:  Atypical chest pain Paroxysmal atrial fibrillation Localized swelling of left LE Essential Hypertension Obesity  CVID  Prediabetes    Disposition: Home  Discharge Condition: Stable  Discharge Exam:  General: pleasant young lady, NAD, lying comfortably in bed  HEENT: normocephalic, atraumatic, moist mucous membranes CV: regular rate and rhythm without murmurs, rubs, or gallops, no lower extremity edema, 2+ radial and pedal pulses bilaterally Lungs: clear to auscultation bilaterally with normal work of breathing Abdomen: soft, non-tender, non-distended, normoactive bowel sounds Skin: warm, dry Extremities: warm and well perfused  Brief Hospital Course:  Monique Clark is a 53 y.o. female with past medical history significant for obesity, HTN, non-hodgkin's MALToma, lung nodule s/p wedge resection (2014), common variable immunodeficency, prediabetes, and s/p left hip replacement in 2019, who presented with atypical chest pain occurring at rest and exertion radiating to left shoulder with associated SOB and dizziness. Troponins were trended and negative, EKG without sinus changes. Echo with EF 55-60% with mild diastolic dysfunction. Cardiology was consulted and patient underwent coronary CTA on 6/16 which showed mild plaque and liver steatosis, otherwise normal. Given mild plaquing and lipid panel notable for LDL of 150 she was started on Crestor 10mg  QD for primary prevention. She also demonstrated positive orthostatics with rise in heart rate consistent with  postural orthostatic tachycardia syndrome (HR increase from 58 to 108 with rise in BP as well).  Patient was also started on Bystolic 2.5mg  QD, in addition to her home HCTZ and Diltiazem. Patient denied any further chest pain throughout admission. Life style modifications were encouranged throughout admission. She was discharged in stable condition with close follow up arranged.   During admission, patient endorsed some acute left LE swelling. Left LE doppler was obtained and was negative for DVT.  CHADS2VASC score of 2 in regards to her A-fib with anticoagulation. There is no clear recommendation for or against anticoagulation with this score. Recommend continued discussion of risk and benefits in regards to anticoagulation going forward.   Issues for Follow Up:  1. Please continue to monitor HR and blood pressure on new medications 2. Continue to encourage life style modifications 3. Please continue to discuss risks and benefits in regards to anticoagulation for patients a-dib  Significant Procedures:  6/16: Coronary CTA  Significant Labs and Imaging:  Recent Labs  Lab 03/14/19 0208 03/15/19 0257 03/16/19 0330  WBC 9.6 11.6* 12.7*  HGB 11.7* 12.4 11.9*  HCT 37.5 39.0 37.6  PLT 430* 407* 425*   Recent Labs  Lab 03/13/19 1140 03/13/19 1527 03/14/19 0208 03/15/19 0257 03/16/19 0330  NA 135  --  139 138 139  K 3.8  --  4.2 3.7 3.7  CL 101  --  103 102 102  CO2 23  --  27 25 25   GLUCOSE 90  --  104* 108* 104*  BUN 22*  --  22* 25* 20  CREATININE 0.89  --  0.98 0.83 0.72  CALCIUM 9.8  --  9.4 9.5 9.5  MG  --  1.8  --   --   --    A1C 5.3 PT/INR: 13.1/1.0 TSH: 1.430 Mag 1.8 Trop neg x 3 Lipid  panel: Chol 222, Trig 63, HDL 59, LDL 150  Dg Chest 2 View  Result Date: 03/13/2019 CLINICAL DATA:  Dizziness.  Chest discomfort. EXAM: CHEST - 2 VIEW COMPARISON:  None. FINDINGS: The heart size and mediastinal contours are within normal limits. Both lungs are clear. The visualized  skeletal structures are unremarkable. IMPRESSION: No active cardiopulmonary disease. Electronically Signed   By: Dorise Bullion III M.D   On: 03/13/2019 12:33   Ct Coronary Morph W/cta Cor Nancy Fetter W/ca W/cm &/or Wo/cm  Result Date: 03/16/2019 EXAM: OVER-READ INTERPRETATION  CT CHEST The following report is an over-read performed by radiologist Dr. Aletta Edouard of [facility], [place] on 03/16/2019. This over-read does not include interpretation of cardiac or coronary anatomy or pathology. The coronary CTA interpretation by the cardiologist is attached. COMPARISON:  None. FINDINGS: Vascular: No incidental vascular findings. Mediastinum/Nodes: No lymphadenopathy identified in the visualized mediastinum or hilar regions. Slight hazy opacity in the upper anterior mediastinum likely relates to thymic remnant tissue. Lungs/Pleura: There is some scarring in the lingula. Visualized lungs show no evidence of pulmonary edema, consolidation, pneumothorax, nodule or pleural fluid. Upper Abdomen: The visualized upper abdomen demonstrates probable steatosis of the liver. Musculoskeletal: No chest wall mass or suspicious bone lesions identified. IMPRESSION: 1. 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 2. Probable steatosis of the liver. Electronically Signed   By: Aletta Edouard M.D.   On: 03/16/2019 08:01   Vas [place] Lower Extremity Venous (dvt)  Result Date: 03/14/2019  Lower Venous Study Indications: Edema.  Comparison Study: No prior study on file for comparison. Performing Technologist: Sharion Dove RVS  Examination Guidelines: A complete evaluation includes B-mode imaging, spectral Doppler, color Doppler, and power Doppler as needed of all accessible portions of each vessel. Bilateral testing is considered an integral part of a complete examination. Limited examinations for reoccurring indications may be performed as noted.  +-----+---------------+---------+-----------+----------+-------+  RIGHTCompressibilityPhasicitySpontaneityPropertiesSummary +-----+---------------+---------+-----------+----------+-------+ CFV  Full           Yes      Yes                          +-----+---------------+---------+-----------+----------+-------+   +---------+---------------+---------+-----------+----------+-------+ LEFT     CompressibilityPhasicitySpontaneityPropertiesSummary +---------+---------------+---------+-----------+----------+-------+ CFV      Full           Yes      Yes                          +---------+---------------+---------+-----------+----------+-------+ SFJ      Full                                                 +---------+---------------+---------+-----------+----------+-------+ FV Prox  Full                                                 +---------+---------------+---------+-----------+----------+-------+ FV Mid   Full                                                 +---------+---------------+---------+-----------+----------+-------+ FV DistalFull                                                 +---------+---------------+---------+-----------+----------+-------+  PFV      Full                                                 +---------+---------------+---------+-----------+----------+-------+ POP      Full           Yes      Yes                          +---------+---------------+---------+-----------+----------+-------+ PTV      Full                                                 +---------+---------------+---------+-----------+----------+-------+ PERO     Full                                                 +---------+---------------+---------+-----------+----------+-------+     Summary: Right: No evidence of common femoral vein obstruction. Left: There is no evidence of deep vein thrombosis in the lower extremity.  *See table(s) above for measurements and observations. Electronically signed by Ruta Hinds  MD on 03/14/2019 at 5:16:33 PM.    Final    [place] Ekg Site Rite  Result Date: 03/15/2019 If Site Rite image not attached, placement could not be confirmed due to current cardiac rhythm.  [place] Ekg Site Rite  Result Date: 03/14/2019 If Site Rite image not attached, placement could not be confirmed due to current cardiac rhythm.  Results/Tests Pending at Time of Discharge: None  Discharge Medications:  Allergies as of 03/16/2019      Reactions   Nizatidine Hives   Axid   Promethazine Other (See Comments)   High doses causes confusion   Tetanus Toxoids    angioedema      Medication List    STOP taking these medications   Red Yeast Rice Extract 600 MG Tabs   vitamin E 1000 UNIT capsule     TAKE these medications   acetaminophen 650 MG CR tablet Commonly known as: TYLENOL Take 650 mg by mouth every 8 (eight) hours as needed for pain.   aspirin 325 MG tablet Take 325 mg by mouth daily.   dilTIAZem CD 120 MG 24 hr capsule Generic drug: diltiazem Take 120 mg by mouth at bedtime.   DULoxetine 30 MG capsule Commonly known as: CYMBALTA Take 30 mg by mouth daily at 12 noon.   famotidine 20 MG tablet Commonly known as: PEPCID Take 20 mg by mouth daily.   Hizentra 4 GM/20ML Soln Generic drug: Immune Globulin (Human) Inject 18 g into the skin once a week. Monday   hydrochlorothiazide 25 MG tablet Commonly known as: HYDRODIURIL Take 25 mg by mouth daily.   magnesium oxide 400 MG tablet Commonly known as: MAG-OX Take 400 mg by mouth daily.   MULTIVITAMIN PO Take 1 tablet by mouth daily at 12 noon.   nebivolol 2.5 MG tablet Commonly known as: BYSTOLIC Take 1 tablet (2.5 mg total) by mouth 2 (two) times a day.   potassium chloride 20 MEQ packet Commonly known as: KLOR-CON Take by mouth 2 (  two) times daily.   potassium chloride SA 20 MEQ tablet Commonly known as: K-DUR Take 40 mEq by mouth 2 (two) times daily.   pramipexole 0.25 MG tablet Commonly known as:  MIRAPEX Take 0.25 mg by mouth at bedtime.   rosuvastatin 10 MG tablet Commonly known as: CRESTOR Take 1 tablet (10 mg total) by mouth daily at 6 PM.   tiZANidine 4 MG capsule Commonly known as: ZANAFLEX Take 4 mg by mouth at bedtime.   Vitamin D (Ergocalciferol) 1.25 MG (50000 UT) Caps capsule Commonly known as: DRISDOL Take 50,000 Units by mouth See admin instructions. Bi weekly Tuesday and Thursday   zolpidem 5 MG tablet Commonly known as: AMBIEN Take 5 mg by mouth at bedtime as needed.       Discharge Instructions: Please refer to Patient Instructions section of EMR for full details.  Patient was counseled important signs and symptoms that should prompt return to medical care, changes in medications, dietary instructions, activity restrictions, and follow up appointments.   Follow-Up Appointments: Follow-up Information    Lonia Mad, MD. Schedule an appointment as soon as possible for a visit.   Specialty: Internal Medicine Why: 1-2 weeks for follow up          Danna Hefty, DO 03/16/2019, 9:16 AM PGY-1, [place]

## 2019-03-14 NOTE — Progress Notes (Signed)
VASCULAR LAB PRELIMINARY  PRELIMINARY  PRELIMINARY  PRELIMINARY  Left lower extremity venous duplex completed.    Preliminary report:  See CV proc for preliminary results.   Manami Tutor, RVT 03/14/2019, 4:20 PM

## 2019-03-14 NOTE — Progress Notes (Signed)
Cardiac Nurse Navigator called regarding an IV for this pt for IR IV Start. Advised that we cannot accommodate this today due to schedule but we can try to fit into the schedule tomorrow. Also asked if IV team can place a PICC line, nurse navigator states that she will check to see.

## 2019-03-15 ENCOUNTER — Observation Stay: Payer: Self-pay

## 2019-03-15 ENCOUNTER — Observation Stay (HOSPITAL_BASED_OUTPATIENT_CLINIC_OR_DEPARTMENT_OTHER): Payer: 59

## 2019-03-15 DIAGNOSIS — Z1159 Encounter for screening for other viral diseases: Secondary | ICD-10-CM | POA: Diagnosis not present

## 2019-03-15 DIAGNOSIS — E785 Hyperlipidemia, unspecified: Secondary | ICD-10-CM | POA: Diagnosis not present

## 2019-03-15 DIAGNOSIS — I10 Essential (primary) hypertension: Secondary | ICD-10-CM | POA: Diagnosis not present

## 2019-03-15 DIAGNOSIS — E669 Obesity, unspecified: Secondary | ICD-10-CM | POA: Diagnosis not present

## 2019-03-15 DIAGNOSIS — Z79899 Other long term (current) drug therapy: Secondary | ICD-10-CM | POA: Diagnosis not present

## 2019-03-15 DIAGNOSIS — R2231 Localized swelling, mass and lump, right upper limb: Secondary | ICD-10-CM | POA: Diagnosis not present

## 2019-03-15 DIAGNOSIS — R7303 Prediabetes: Secondary | ICD-10-CM | POA: Diagnosis not present

## 2019-03-15 DIAGNOSIS — R0789 Other chest pain: Secondary | ICD-10-CM | POA: Diagnosis not present

## 2019-03-15 DIAGNOSIS — M7989 Other specified soft tissue disorders: Secondary | ICD-10-CM | POA: Diagnosis not present

## 2019-03-15 DIAGNOSIS — R079 Chest pain, unspecified: Secondary | ICD-10-CM

## 2019-03-15 DIAGNOSIS — R42 Dizziness and giddiness: Secondary | ICD-10-CM | POA: Diagnosis not present

## 2019-03-15 DIAGNOSIS — Z7982 Long term (current) use of aspirin: Secondary | ICD-10-CM | POA: Diagnosis not present

## 2019-03-15 DIAGNOSIS — I480 Paroxysmal atrial fibrillation: Secondary | ICD-10-CM | POA: Diagnosis not present

## 2019-03-15 DIAGNOSIS — L539 Erythematous condition, unspecified: Secondary | ICD-10-CM | POA: Diagnosis not present

## 2019-03-15 DIAGNOSIS — R2242 Localized swelling, mass and lump, left lower limb: Secondary | ICD-10-CM | POA: Diagnosis not present

## 2019-03-15 DIAGNOSIS — R072 Precordial pain: Secondary | ICD-10-CM | POA: Diagnosis not present

## 2019-03-15 LAB — ECHOCARDIOGRAM COMPLETE
Height: 64 in
Weight: 4543.24 oz

## 2019-03-15 LAB — BASIC METABOLIC PANEL
Anion gap: 11 (ref 5–15)
BUN: 25 mg/dL — ABNORMAL HIGH (ref 6–20)
CO2: 25 mmol/L (ref 22–32)
Calcium: 9.5 mg/dL (ref 8.9–10.3)
Chloride: 102 mmol/L (ref 98–111)
Creatinine, Ser: 0.83 mg/dL (ref 0.44–1.00)
GFR calc Af Amer: >60 mL/min (ref >60)
GFR calc non Af Amer: >60 mL/min (ref >60)
Glucose, Bld: 108 mg/dL — ABNORMAL HIGH (ref 70–99)
Potassium: 3.7 mmol/L (ref 3.5–5.1)
Sodium: 138 mmol/L (ref 135–145)

## 2019-03-15 LAB — CBC
HCT: 39 % (ref 36.0–46.0)
Hemoglobin: 12.4 g/dL (ref 12.0–15.0)
MCH: 27.6 pg (ref 26.0–34.0)
MCHC: 31.8 g/dL (ref 30.0–36.0)
MCV: 86.7 fL (ref 80.0–100.0)
Platelets: 407 10*3/uL — ABNORMAL HIGH (ref 150–400)
RBC: 4.5 MIL/uL (ref 3.87–5.11)
RDW: 15.5 % (ref 11.5–15.5)
WBC: 11.6 10*3/uL — ABNORMAL HIGH (ref 4.0–10.5)
nRBC: 0 % (ref 0.0–0.2)

## 2019-03-15 MED ORDER — NEBIVOLOL HCL 5 MG PO TABS
2.5000 mg | ORAL_TABLET | Freq: Two times a day (BID) | ORAL | Status: DC
  Administered 2019-03-15 – 2019-03-16 (×2): 2.5 mg via ORAL
  Filled 2019-03-15 (×3): qty 1

## 2019-03-15 MED ORDER — ROSUVASTATIN CALCIUM 5 MG PO TABS: 10.0000 mg | ORAL_TABLET | Freq: Every day | ORAL | Status: DC

## 2019-03-15 MED ORDER — NITROGLYCERIN 0.4 MG SL SUBL
SUBLINGUAL_TABLET | SUBLINGUAL | Status: AC
  Filled 2019-03-15: qty 2

## 2019-03-15 MED ORDER — NITROGLYCERIN 0.4 MG SL SUBL
0.8000 mg | SUBLINGUAL_TABLET | Freq: Once | SUBLINGUAL | Status: AC
  Administered 2019-03-15: 12:00:00 0.8 mg via SUBLINGUAL

## 2019-03-15 NOTE — Significant Event (Signed)
For 6/16 at 1900 to 6/17 at 0700, please use pager [phone].  Pager [phone] is not working.  [place] Constable, D.O.  PGY-1 Family Medicine  03/15/2019 7:43 PM

## 2019-03-15 NOTE — Progress Notes (Signed)
Supervising Physician: Markus Daft  Patient Status:  [facility] - In-pt  Chief Complaint:  Contrast extravasation  Subjective:  Patient states her right arm feels a little better this morning. She states she thinks it is still a little swollen.  Allergies: Nizatidine, Promethazine, and Tetanus toxoids  Medications: Prior to Admission medications   Medication Sig Start Date End Date Taking? Authorizing Provider  acetaminophen (TYLENOL) 650 MG CR tablet Take 650 mg by mouth every 8 (eight) hours as needed for pain.   Yes [provider]  aspirin 325 MG tablet Take 325 mg by mouth daily.  07/25/14  Yes [provider]  diltiazem (DILTIAZEM CD) 120 MG 24 hr capsule Take 120 mg by mouth at bedtime.   Yes [provider]  DULoxetine (CYMBALTA) 30 MG capsule Take 30 mg by mouth daily at 12 noon. 08/14/17  Yes [provider]  famotidine (PEPCID) 20 MG tablet Take 20 mg by mouth daily.  07/25/14  Yes [provider]  hydrochlorothiazide (HYDRODIURIL) 25 MG tablet Take 25 mg by mouth daily.    Yes [provider]  Immune Globulin, Human, (HIZENTRA) 4 GM/20ML SOLN Inject 18 g into the skin once a week. Monday 05/03/15  Yes [provider]  magnesium oxide (MAG-OX) 400 MG tablet Take 400 mg by mouth daily.   Yes [provider]  Multiple Vitamins-Minerals (MULTIVITAMIN PO) Take 1 tablet by mouth daily at 12 noon.    Yes [provider]  potassium chloride (KLOR-CON) 20 MEQ packet Take by mouth 2 (two) times daily.   Yes [provider]  potassium chloride SA (K-DUR) 20 MEQ tablet Take 40 mEq by mouth 2 (two) times daily.   Yes [provider]  pramipexole (MIRAPEX) 0.25 MG tablet Take 0.25 mg by mouth at bedtime.   Yes [provider]  Red Yeast Rice Extract 600 MG TABS Take 1,200 mg by mouth daily at 12 noon.   Yes [provider]  tiZANidine (ZANAFLEX) 4 MG capsule Take 4 mg by mouth  at bedtime.  03/04/17  Yes [provider]  Vitamin D, Ergocalciferol, (DRISDOL) 50000 units CAPS capsule Take 50,000 Units by mouth See admin instructions. Bi weekly Tuesday and Thursday 01/29/17  Yes [provider]  vitamin E 1000 UNIT capsule Take 1,000 Units by mouth daily.    Yes [provider]  zolpidem (AMBIEN) 5 MG tablet Take 5 mg by mouth at bedtime as needed. 08/14/17  Yes [provider]     Vital Signs: BP 119/72 (BP Location: Left Wrist)   Pulse 68   Temp 98.1 F (36.7 C) (Oral)   Resp 18   Ht 5\' 4"  (1.626 m)   Wt 128.8 kg   SpO2 95%   BMI 48.74 kg/m   Physical Exam Right inner upper arm still with mild erythema. Skin cool secondary to ice pack use. No blistering. No palpable firmness under the skin Only mild swelling. Good and and finger ROM Pulses intact.   Imaging: Dg Chest 2 View  Result Date: 03/13/2019 CLINICAL DATA:  Dizziness.  Chest discomfort. EXAM: CHEST - 2 VIEW COMPARISON:  None. FINDINGS: The heart size and mediastinal contours are within normal limits. Both lungs are clear. The visualized skeletal structures are unremarkable. IMPRESSION: No active cardiopulmonary disease. Electronically Signed   By: Dorise Bullion III M.D   On: 03/13/2019 12:33   Vas [place] Lower Extremity Venous (dvt)  Result Date: 03/14/2019  Lower Venous Study Indications:  Edema.  Comparison Study: No prior study on file for comparison. Performing Technologist: Sharion Dove RVS  Examination Guidelines: A complete evaluation includes B-mode imaging, spectral Doppler, color Doppler, and power Doppler as needed of all accessible portions of each vessel. Bilateral testing is considered an integral part of a complete examination. Limited examinations for reoccurring indications may be performed as noted.  +-----+---------------+---------+-----------+----------+-------+ RIGHTCompressibilityPhasicitySpontaneityPropertiesSummary  +-----+---------------+---------+-----------+----------+-------+ CFV  Full           Yes      Yes                          +-----+---------------+---------+-----------+----------+-------+   +---------+---------------+---------+-----------+----------+-------+ LEFT     CompressibilityPhasicitySpontaneityPropertiesSummary +---------+---------------+---------+-----------+----------+-------+ CFV      Full           Yes      Yes                          +---------+---------------+---------+-----------+----------+-------+ SFJ      Full                                                 +---------+---------------+---------+-----------+----------+-------+ FV Prox  Full                                                 +---------+---------------+---------+-----------+----------+-------+ FV Mid   Full                                                 +---------+---------------+---------+-----------+----------+-------+ FV DistalFull                                                 +---------+---------------+---------+-----------+----------+-------+ PFV      Full                                                 +---------+---------------+---------+-----------+----------+-------+ POP      Full           Yes      Yes                          +---------+---------------+---------+-----------+----------+-------+ PTV      Full                                                 +---------+---------------+---------+-----------+----------+-------+ PERO     Full                                                 +---------+---------------+---------+-----------+----------+-------+  Summary: Right: No evidence of common femoral vein obstruction. Left: There is no evidence of deep vein thrombosis in the lower extremity.  *See table(s) above for measurements and observations. Electronically signed by Ruta Hinds MD on 03/14/2019 at 5:16:33 PM.    Final    [place] Ekg Site Rite   Result Date: 03/14/2019 If Site Rite image not attached, placement could not be confirmed due to current cardiac rhythm.   Labs:  CBC: Recent Labs    03/13/19 1140 03/14/19 0208 03/15/19 0257  WBC 9.8 9.6 11.6*  HGB 12.5 11.7* 12.4  HCT 40.6 37.5 39.0  PLT 443* 430* 407*    COAGS: Recent Labs    03/13/19 1527  INR 1.0    BMP: Recent Labs    03/13/19 1140 03/14/19 0208 03/15/19 0257  NA 135 139 138  K 3.8 4.2 3.7  CL 101 103 102  CO2 23 27 25   GLUCOSE 90 104* 108*  BUN 22* 22* 25*  CALCIUM 9.8 9.4 9.5  CREATININE 0.89 0.98 0.83  GFRNONAA >60 >60 >60  GFRAA >60 >60 >60    LIVER FUNCTION TESTS: No results for input(s): BILITOT, AST, ALT, ALKPHOS, PROT, ALBUMIN in the last 8760 hours.  Assessment and Plan:  Continue to monitor skin.  Can continue ice for comfort.  *Recommend PICC by IV team or IV placement in IR for contrasted imaging.  Electronically Signed: Murrell Redden, PA-C 03/15/2019, 9:44 AM    I spent a total of 15 Minutes at the the patient's bedside AND on the patient's hospital floor or unit, greater than 50% of which was counseling/coordinating care for f/u contrast extrav.

## 2019-03-15 NOTE — Progress Notes (Signed)
Pt refused to have PICC inserted ,preferred midline.Midline was inserted and pt tolerated well.

## 2019-03-15 NOTE — Progress Notes (Signed)
Reviewed echo images  LVEF and RVEF are normal  Some mild diastolic dysfunction. Coronary CT with mild plaquing   CP does not appear to be due to coronary ischemai ? Due to diastolic dysfunction  Note orthostatics today  HR went up consistent with  POTS (Postural orthostatic tachycardia syndrom)   58 to 108 bpm    BP also went up  I would recomm low dose of a nonselective b blocker like bystolic   She was on in past   Go with lower dose 2.5 bid   With mild plaaquing would recomm statin   Crestor 10 mg  Increase physical activity.   Work on weight loss.  WIll make sure pt has outpt f/u  OK to discharge tonight.     Dorris Carnes MD

## 2019-03-15 NOTE — Progress Notes (Signed)
  Echocardiogram 2D Echocardiogram has been performed.  Monique Clark [name] 03/15/2019, 10:13 AM

## 2019-03-15 NOTE — Progress Notes (Signed)
Family Medicine Teaching Service Daily Progress Note Intern Pager: [phone]  Patient name: Monique Clark Medical record number: [id number] Date of birth: [birth year] Age: 53 y.o. Gender: female  Primary Care Provider: Lonia Mad, MD Consultants: Cardiology, IR Code Status: Full  Pt Overview and Major Events to Date:  6/14 Admit to FPTS for ACS rule out  Assessment and Plan: Monique Clark is a 53 y.o. female presenting with left sided chest pain. 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  Atypical Chest Pain: Patient to have CTA yesterday but IV infiltrated. IR consulted for RUE swelling and IV removed. RUE swelling improved. IV/PICC to be replaced and coronary CTA planned for today. Echo pending. Orthostatics were positive for HR. Denies any further chest pain as well as nausea, dizziness, or SOB. Has remained hemodynamically stale and afebrile. Patient does mention having elevated ESR, CRP in the past that had no further work up associated (no records in chart). Given her history of CVID, autoimmune pathology is possible cause for her chest pain. - cards consulted, appreciate recs - follow up echo - follow up CTA - Continue home ASA 311m QD, Diltiazem 129mQD - tylneol PRN, zofran PRN - nitro SL 0.49m449mRN chest pain - encourage lifestyle modifications - will defer further evaluation of ESR/CRP to outpatient   Left LE swelling: LLE doppler negative for DVT.   Paroxysmal A-fib: Home meds: ASA 325m8md Diltiazem 120mg23m. EKG with NSR. HR 60-80. HASBLED 1 and CHADSVASC2: 2. - continue home meds - consider anticoagulation therapy   HTN: BP 107/7027/25 AM. Systollically 120-1366-440'Hnight. Home meds: HCTZ  25mg 71mnd Diltiazem 120mg q33mith MagOx 400mg QD40m K-dur 20mg QD 57mntinue home meds  Common variable immunodeficiency: Followed by Dr. Amanda BaLoney Laurenceds: Hizentra: 18g SQ weekly at home. Last injection 6/8, due for next dose evening of 6/9. She notes she can delay dose by 1 day if needed.  - continue at discharge  Pre-Diabetes: Hgb A1c 5.3. - encourage life style modifications   Restless Leg Syndrome  Muscle Cramps  [place]: Home meds: Tizanidine 49mg qHS, 46mpidem 5mg qHS, a79mPramipexole 0.25mg qHS - 49minue Pramipexole 0.25mg qHS - M649monin for sleep - continue Tizanidine 49mg qHS  FEN/249m Heart healthy Prophylaxis: SQ heparin  Disposition: home pending CTA results  Subjective:  Patient doing well this morning.  Some tenderness in her right upper extremity, however improved since yesterday.  Denies any further chest pain, nausea, dizziness, lightheadedness.  Slept better overnight.  Notes she had elevated ESR and CRP in the past that was never worked up.  Objective: Temp:  [97.4 F (36.3 C)-98.5 F (36.9 C)] 97.4 F (36.3 C) (06/16 0300) Pulse Rate:  [61-106] 61 (06/16 0300) Resp:  [14-76] 21 (06/16 0300) BP: (106-146)/(57-109) 110/80 (06/16 0300) SpO2:  [96 %-100 %] 97 % (06/16 0300) Physical Exam: General: Pleasant young lady, NAD, lying comfortably in bed this morning CV: regular rate and rhythm without murmurs, rubs, or gallops, 2+ radial pulses bilaterally Lungs: clear to auscultation bilaterally with normal work of breathing Abdomen: soft, non-tender, non-distended, normoactive bowel sounds Skin: warm, dry, RUE with some residual erythema and petechia from IV Extremities: warm and well perfused, RUE: no significant swelling appreciated, no increased warmth, mildly tender to palpation  Laboratory:  Recent Labs  Lab 03/13/19 1140 03/14/19 0208 03/15/19 0257  WBC 9.8 9.6 11.6*  HGB 12.5 11.7* 12.4  HCT 40.6 37.5 39.0  PLT 443* 430* 407*    Recent Labs  Lab 03/13/19 1140 03/14/19 0208 03/15/19 0257  NA 135 139 138  K 3.8 4.2 3.7  CL 101 103 102  CO2 _0 BUN 22* 22* 25*  CREATININE 0.89 0.98 0.83  CALCIUM 9.8 9.4 9.5  GLUCOSE 90 104* 108*   A1C 5.3 PT/INR: 13.1/1.0 TSH: 1.430 Mag 1.8 Trop neg x 3 Lipid panel: Chol 222, Trig 63, HDL 59, LDL 150  Imaging/Diagnostic Tests: Dg Chest 2 View  Result Date: 03/13/2019 CLINICAL DATA:  Dizziness.  Chest discomfort. EXAM: CHEST - 2 VIEW COMPARISON:  None. FINDINGS: The heart size and mediastinal contours are within normal limits. Both lungs are clear. The visualized skeletal structures are unremarkable. IMPRESSION: No active cardiopulmonary disease. Electronically Signed   By: Dorise Bullion III M.D   On: 03/13/2019 12:33   Vas [place] Lower Extremity Venous (dvt)  Result Date: 03/14/2019  Lower Venous Study Indications: Edema.  Comparison Study: No prior study on file for comparison. Performing Technologist: Sharion Dove RVS  Examination Guidelines: A complete evaluation includes B-mode imaging, spectral Doppler, color Doppler, and power Doppler as needed of all accessible portions of each vessel. Bilateral testing is considered an integral part of a complete examination. Limited examinations for reoccurring indications may be performed as noted.  +-----+---------------+---------+-----------+----------+-------+ RIGHTCompressibilityPhasicitySpontaneityPropertiesSummary +-----+---------------+---------+-----------+----------+-------+ CFV  Full           Yes      Yes                          +-----+---------------+---------+-----------+----------+-------+   +---------+---------------+---------+-----------+----------+-------+ LEFT     CompressibilityPhasicitySpontaneityPropertiesSummary +---------+---------------+---------+-----------+----------+-------+ CFV      Full           Yes      Yes                           +---------+---------------+---------+-----------+----------+-------+ SFJ      Full                                                 +---------+---------------+---------+-----------+----------+-------+ FV Prox  Full                                                 +---------+---------------+---------+-----------+----------+-------+ FV Mid   Full                                                 +---------+---------------+---------+-----------+----------+-------+ FV DistalFull                                                 +---------+---------------+---------+-----------+----------+-------+ PFV      Full                                                 +---------+---------------+---------+-----------+----------+-------+  POP      Full           Yes      Yes                          +---------+---------------+---------+-----------+----------+-------+ PTV      Full                                                 +---------+---------------+---------+-----------+----------+-------+ PERO     Full                                                 +---------+---------------+---------+-----------+----------+-------+     Summary: Right: No evidence of common femoral vein obstruction. Left: There is no evidence of deep vein thrombosis in the lower extremity.  *See table(s) above for measurements and observations. Electronically signed by Charles Fields MD on 03/14/2019 at 5:16:33 PM.    Final    Us Ekg Site Rite  Result Date: 03/14/2019 If Site Rite image not attached, placement could not be confirmed due to current cardiac rhythm.   ,  P, DO 03/15/2019, 7:43 AM PGY-1,  Family Medicine FPTS Intern pager: [phone], text pages welcome  

## 2019-03-15 NOTE — Progress Notes (Signed)
Progress Note  Patient Name: Monique Clark Date of Encounter: 03/15/2019  Primary Cardiologist: New Primary MD Dr Calton Dach    Subjective   Pt c/o mild chest tightness  Breathing is OK  Inpatient Medications    Scheduled Meds: . aspirin  325 mg Oral Daily  . diltiazem  120 mg Oral QHS  . DULoxetine  30 mg Oral Q1200  . famotidine  20 mg Oral Daily  . heparin  5,000 Units Subcutaneous Q8H  . hydrochlorothiazide  25 mg Oral Daily  . magnesium oxide  400 mg Oral Daily  . potassium chloride SA  40 mEq Oral BID  . pramipexole  0.25 mg Oral QHS  . sodium chloride flush  10-40 mL Intracatheter Q12H  . tiZANidine  4 mg Oral QHS   Continuous Infusions:  PRN Meds: acetaminophen, nitroGLYCERIN, ondansetron (ZOFRAN) IV, sodium chloride flush, zolpidem   Vital Signs    Vitals:   03/14/19 2036 03/14/19 2207 03/14/19 2300 03/15/19 0300  BP: 116/74 106/65 (!) 129/57 110/80  Pulse:   62 61  Resp: (!) 76  15 (!) 21  Temp:   98 F (36.7 C) (!) 97.4 F (36.3 C)  TempSrc:   Oral Oral  SpO2:   96% 97%  Weight:      Height:       No intake or output data in the 24 hours ending 03/15/19 0601 Last 3 Weights 03/13/2019 05/12/2017 03/10/2017  Weight (lbs) 283 lb 15.2 oz 284 lb 284 lb  Weight (kg) 128.8 kg 128.822 kg 128.822 kg      Telemetry    NSR  - Personally Reviewed  ECG    No new EKG- Personally Reviewed  Physical Exam   GEN: Morbidly obese 53 yo acute distress.   Neck: No JVD Cardiac: RRR, no murmurs, rubs, or gallops.  Respiratory: Clear to auscultation bilaterally. GI: Soft, nontender, non-distended  MS: No edema; No deformity. Neuro:  Nonfocal  Psych: Normal affect   Labs    Chemistry Recent Labs  Lab 03/13/19 1140 03/14/19 0208 03/15/19 0257  NA 135 139 138  K 3.8 4.2 3.7  CL 101 103 102  CO2 23 27 25   GLUCOSE 90 104* 108*  BUN 22* 22* 25*  CREATININE 0.89 0.98 0.83  CALCIUM 9.8 9.4 9.5  GFRNONAA >60 >60 >60  GFRAA >60 >60 >60  ANIONGAP 11 9  11      Hematology Recent Labs  Lab 03/13/19 1140 03/14/19 0208 03/15/19 0257  WBC 9.8 9.6 11.6*  RBC 4.62 4.33 4.50  HGB 12.5 11.7* 12.4  HCT 40.6 37.5 39.0  MCV 87.9 86.6 86.7  MCH 27.1 27.0 27.6  MCHC 30.8 31.2 31.8  RDW 15.6* 15.3 15.5  PLT 443* 430* 407*    Cardiac Enzymes Recent Labs  Lab 03/13/19 1140 03/13/19 1527 03/13/19 2157 03/14/19 0208  TROPONINI <0.03 <0.03 <0.03 <0.03   No results for input(s): TROPIPOC in the last 168 hours.   BNPNo results for input(s): BNP, PROBNP in the last 168 hours.   DDimer No results for input(s): DDIMER in the last 168 hours.   Radiology    Dg Chest 2 View  Result Date: 03/13/2019 CLINICAL DATA:  Dizziness.  Chest discomfort. EXAM: CHEST - 2 VIEW COMPARISON:  None. FINDINGS: The heart size and mediastinal contours are within normal limits. Both lungs are clear. The visualized skeletal structures are unremarkable. IMPRESSION: No active cardiopulmonary disease. Electronically Signed   By: Dorise Bullion III M.D   On:  03/13/2019 12:33   Vas [place] Lower Extremity Venous (dvt)  Result Date: 03/14/2019  Lower Venous Study Indications: Edema.  Comparison Study: No prior study on file for comparison. Performing Technologist: Sharion Dove RVS  Examination Guidelines: A complete evaluation includes B-mode imaging, spectral Doppler, color Doppler, and power Doppler as needed of all accessible portions of each vessel. Bilateral testing is considered an integral part of a complete examination. Limited examinations for reoccurring indications may be performed as noted.  +-----+---------------+---------+-----------+----------+-------+ RIGHTCompressibilityPhasicitySpontaneityPropertiesSummary +-----+---------------+---------+-----------+----------+-------+ CFV  Full           Yes      Yes                          +-----+---------------+---------+-----------+----------+-------+    +---------+---------------+---------+-----------+----------+-------+ LEFT     CompressibilityPhasicitySpontaneityPropertiesSummary +---------+---------------+---------+-----------+----------+-------+ CFV      Full           Yes      Yes                          +---------+---------------+---------+-----------+----------+-------+ SFJ      Full                                                 +---------+---------------+---------+-----------+----------+-------+ FV Prox  Full                                                 +---------+---------------+---------+-----------+----------+-------+ FV Mid   Full                                                 +---------+---------------+---------+-----------+----------+-------+ FV DistalFull                                                 +---------+---------------+---------+-----------+----------+-------+ PFV      Full                                                 +---------+---------------+---------+-----------+----------+-------+ POP      Full           Yes      Yes                          +---------+---------------+---------+-----------+----------+-------+ PTV      Full                                                 +---------+---------------+---------+-----------+----------+-------+ PERO     Full                                                 +---------+---------------+---------+-----------+----------+-------+  Summary: Right: No evidence of common femoral vein obstruction. Left: There is no evidence of deep vein thrombosis in the lower extremity.  *See table(s) above for measurements and observations. Electronically signed by Ruta Hinds MD on 03/14/2019 at 5:16:33 PM.    Final    [place] Ekg Site Rite  Result Date: 03/14/2019 If Site Rite image not attached, placement could not be confirmed due to current cardiac rhythm.   Cardiac Studies   Cardiac CT pending Echo just completed     Patient Profile     53 y.o. female *Hx of afib, HTN, CVID, prediabetes admitted with CP  Assessment & Plan    1  CP  Hx is somewhat atypical but concerning .   Pt has risk factors for CAD (prediabetes, morbid obesity) Echo just done  CT not able to be done aside from calcium score yesterday due to IV problems   Plan for today  CA score 4    2  Lipids   LDL 150   HDL 59  Pt on red yeast rice   Await results of CT scan to discuss further    For questions or updates, please contact [facility] Please consult [email or web address] for contact info under        Signed, Dorris Carnes, MD  03/15/2019, 6:01 AM

## 2019-03-15 NOTE — Discharge Instructions (Signed)
You were admitted to [facility] for chest pain.  He received a coronary CTA which shows mild plaquing but otherwise normal.  Cardiology recommended you start Crestor 10 mg and Bystolic 2.5 mg twice a day.  It will be very important for you to take these every day and to follow-up with your PCP and cardiologist.  Thank you for allowing [place] to take care of you.

## 2019-03-16 DIAGNOSIS — Z1159 Encounter for screening for other viral diseases: Secondary | ICD-10-CM | POA: Diagnosis not present

## 2019-03-16 DIAGNOSIS — R7303 Prediabetes: Secondary | ICD-10-CM | POA: Diagnosis not present

## 2019-03-16 DIAGNOSIS — R42 Dizziness and giddiness: Secondary | ICD-10-CM | POA: Diagnosis not present

## 2019-03-16 DIAGNOSIS — Z79899 Other long term (current) drug therapy: Secondary | ICD-10-CM | POA: Diagnosis not present

## 2019-03-16 DIAGNOSIS — R072 Precordial pain: Secondary | ICD-10-CM | POA: Diagnosis not present

## 2019-03-16 DIAGNOSIS — R2242 Localized swelling, mass and lump, left lower limb: Secondary | ICD-10-CM | POA: Diagnosis not present

## 2019-03-16 DIAGNOSIS — R079 Chest pain, unspecified: Secondary | ICD-10-CM | POA: Diagnosis not present

## 2019-03-16 DIAGNOSIS — M7989 Other specified soft tissue disorders: Secondary | ICD-10-CM | POA: Diagnosis not present

## 2019-03-16 DIAGNOSIS — Z7982 Long term (current) use of aspirin: Secondary | ICD-10-CM | POA: Diagnosis not present

## 2019-03-16 DIAGNOSIS — I480 Paroxysmal atrial fibrillation: Secondary | ICD-10-CM | POA: Diagnosis not present

## 2019-03-16 DIAGNOSIS — E669 Obesity, unspecified: Secondary | ICD-10-CM | POA: Diagnosis not present

## 2019-03-16 DIAGNOSIS — R0789 Other chest pain: Secondary | ICD-10-CM | POA: Diagnosis not present

## 2019-03-16 DIAGNOSIS — E785 Hyperlipidemia, unspecified: Secondary | ICD-10-CM | POA: Diagnosis not present

## 2019-03-16 DIAGNOSIS — I10 Essential (primary) hypertension: Secondary | ICD-10-CM | POA: Diagnosis not present

## 2019-03-16 LAB — CBC
HCT: 37.6 % (ref 36.0–46.0)
Hemoglobin: 11.9 g/dL — ABNORMAL LOW (ref 12.0–15.0)
MCH: 27.4 pg (ref 26.0–34.0)
MCHC: 31.6 g/dL (ref 30.0–36.0)
MCV: 86.6 fL (ref 80.0–100.0)
Platelets: 425 10*3/uL — ABNORMAL HIGH (ref 150–400)
RBC: 4.34 MIL/uL (ref 3.87–5.11)
RDW: 15.6 % — ABNORMAL HIGH (ref 11.5–15.5)
WBC: 12.7 10*3/uL — ABNORMAL HIGH (ref 4.0–10.5)
nRBC: 0 % (ref 0.0–0.2)

## 2019-03-16 LAB — BASIC METABOLIC PANEL
Anion gap: 12 (ref 5–15)
BUN: 20 mg/dL (ref 6–20)
CO2: 25 mmol/L (ref 22–32)
Calcium: 9.5 mg/dL (ref 8.9–10.3)
Chloride: 102 mmol/L (ref 98–111)
Creatinine, Ser: 0.72 mg/dL (ref 0.44–1.00)
GFR calc Af Amer: >60 mL/min (ref >60)
GFR calc non Af Amer: >60 mL/min (ref >60)
Glucose, Bld: 104 mg/dL — ABNORMAL HIGH (ref 70–99)
Potassium: 3.7 mmol/L (ref 3.5–5.1)
Sodium: 139 mmol/L (ref 135–145)

## 2019-03-16 MED ORDER — NEBIVOLOL HCL 2.5 MG PO TABS: 2.5000 mg | ORAL_TABLET | Freq: Two times a day (BID) | ORAL | 0 refills | Status: DC

## 2019-03-16 MED ORDER — ROSUVASTATIN CALCIUM 10 MG PO TABS: 10.0000 mg | ORAL_TABLET | Freq: Every day | ORAL | 0 refills | Status: DC

## 2019-03-16 MED FILL — BYSTOLIC 2.5 MG TABLET: 2.5 | 30 days supply | Qty: 60 | Fill #0

## 2019-03-16 MED FILL — ROSUVASTATIN CALCIUM 10 MG: 10 | 30 days supply | Qty: 30 | Fill #0

## 2019-03-16 NOTE — Progress Notes (Signed)
Progress Note  Patient Name: Monique Clark Date of Encounter: 03/16/2019  Primary Cardiologist: New Primary MD Dr Calton Dach    Subjective   Minimal chest discomfort  No SOB   Inpatient Medications    Scheduled Meds:  aspirin  325 mg Oral Daily   diltiazem  120 mg Oral QHS   DULoxetine  30 mg Oral Q1200   famotidine  20 mg Oral Daily   heparin  5,000 Units Subcutaneous Q8H   hydrochlorothiazide  25 mg Oral Daily   magnesium oxide  400 mg Oral Daily   nebivolol  2.5 mg Oral BID   potassium chloride SA  40 mEq Oral BID   pramipexole  0.25 mg Oral QHS   rosuvastatin  10 mg Oral q1800   sodium chloride flush  10-40 mL Intracatheter Q12H   tiZANidine  4 mg Oral QHS   Continuous Infusions:  PRN Meds: acetaminophen, nitroGLYCERIN, ondansetron (ZOFRAN) IV, sodium chloride flush, zolpidem   Vital Signs    Vitals:   03/15/19 2338 03/16/19 0300 03/16/19 0316 03/16/19 0735  BP: 115/83  110/73 (!) 108/98  Pulse: 64 (!) 53 60 61  Resp: 14 19 15 17   Temp: 98.2 F (36.8 C)  (!) 97.5 F (36.4 C) (!) 97.5 F (36.4 C)  TempSrc: Oral  Oral Oral  SpO2: 99% 95% 98%   Weight:      Height:        Intake/Output Summary (Last 24 hours) at 03/16/2019 1053 Last data filed at 03/15/2019 2000 Gross per 24 hour  Intake 600 ml  Output --  Net 600 ml   Last 3 Weights 03/13/2019 05/12/2017 03/10/2017  Weight (lbs) 283 lb 15.2 oz 284 lb 284 lb  Weight (kg) 128.8 kg 128.822 kg 128.822 kg      Telemetry    SR   - Personally Reviewed  ECG    No new EKG- Personally Reviewed  Physical Exam   GEN: Morbidly obese 53 yo acute distress.   Neck: JVP is normal   Cardiac: RRR, no murmurs, rubs, or gallops.  Respiratory: Clear to auscultation bilaterally. GI: Soft, nontender, non-distended  MS: No edema; No deformity. Neuro:  Nonfocal  Psych: Normal affect   Labs    Chemistry Recent Labs  Lab 03/14/19 0208 03/15/19 0257 03/16/19 0330  NA 139 138 139  K 4.2 3.7 3.7   CL 103 102 102  CO2 27 25 25   GLUCOSE 104* 108* 104*  BUN 22* 25* 20  CREATININE 0.98 0.83 0.72  CALCIUM 9.4 9.5 9.5  GFRNONAA >60 >60 >60  GFRAA >60 >60 >60  ANIONGAP 9 11 12      Hematology Recent Labs  Lab 03/14/19 0208 03/15/19 0257 03/16/19 0330  WBC 9.6 11.6* 12.7*  RBC 4.33 4.50 4.34  HGB 11.7* 12.4 11.9*  HCT 37.5 39.0 37.6  MCV 86.6 86.7 86.6  MCH 27.0 27.6 27.4  MCHC 31.2 31.8 31.6  RDW 15.3 15.5 15.6*  PLT 430* 407* 425*    Cardiac Enzymes Recent Labs  Lab 03/13/19 1140 03/13/19 1527 03/13/19 2157 03/14/19 0208  TROPONINI <0.03 <0.03 <0.03 <0.03   No results for input(s): TROPIPOC in the last 168 hours.   BNPNo results for input(s): BNP, PROBNP in the last 168 hours.   DDimer No results for input(s): DDIMER in the last 168 hours.   Radiology    Ct Coronary Morph W/cta Cor W/score W/ca W/cm &/or Wo/cm  Addendum Date: 03/16/2019   ADDENDUM REPORT: 03/16/2019 09:35 CLINICAL  DATA:  53 year old female with chest pain. EXAM: Cardiac/Coronary  CT TECHNIQUE: The patient was scanned on a [employer]. FINDINGS: A 120 kV prospective scan was triggered in the descending thoracic aorta at 111 HU's. Axial non-contrast 3 mm slices were carried out through the heart. The data set was analyzed on a dedicated work station and scored using the [place]. Gantry rotation speed was 250 msecs and collimation was .6 mm. No beta blockade and 0.8 mg of sl NTG was given. The 3D data set was reconstructed in 5% intervals of the 67-82 % of the R-R cycle. Diastolic phases were analyzed on a dedicated work station using MPR, MIP and VRT modes. The patient received 80 cc of contrast. Aorta: Normal size. Minimal atherosclerotic plaque and no calcifications. No dissection. Aortic Valve:  Trileaflet.  No calcifications. Coronary Arteries:  Normal coronary origin.  Right dominance. RCA is a very large dominant artery that gives rise to PDA and PLA. There is no plaque. Left main  is a large artery that gives rise to LAD and LCX arteries. Left main has no plaque. LAD is a medium caliber tortuous vessel that gives rise to one small diagonal artery. There is minimal calcified plaque in the proximal LAD with stenosis 0-25%. LCX is a non-dominant artery that has no obvious plaque. Other findings: Normal pulmonary vein drainage into the left atrium. Normal let atrial appendage without a thrombus. Normal size of the pulmonary artery. IMPRESSION: 1. Coronary calcium score of 4. This was 47 percentile for age and sex matched control. 2. Normal coronary origin with right dominance. 3. Study quality is affected by patient's size, however there is only minimal non-obstructive CAD, risk factor modification is recommended. Electronically Signed   By: Ena Dawley   On: 03/16/2019 09:35   Result Date: 03/16/2019 EXAM: OVER-READ INTERPRETATION  CT CHEST The following report is an over-read performed by radiologist Dr. Aletta Edouard of [facility], [place] on 03/16/2019. This over-read does not include interpretation of cardiac or coronary anatomy or pathology. The coronary CTA interpretation by the cardiologist is attached. COMPARISON:  None. FINDINGS: Vascular: No incidental vascular findings. Mediastinum/Nodes: No lymphadenopathy identified in the visualized mediastinum or hilar regions. Slight hazy opacity in the upper anterior mediastinum likely relates to thymic remnant tissue. Lungs/Pleura: There is some scarring in the lingula. Visualized lungs show no evidence of pulmonary edema, consolidation, pneumothorax, nodule or pleural fluid. Upper Abdomen: The visualized upper abdomen demonstrates probable steatosis of the liver. Musculoskeletal: No chest wall mass or suspicious bone lesions identified. IMPRESSION: 1. 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 2. Probable steatosis of the liver. Electronically Signed: By: Aletta Edouard M.D. On: 03/16/2019 08:01   Vas [place] Lower  Extremity Venous (dvt)  Result Date: 03/14/2019  Lower Venous Study Indications: Edema.  Comparison Study: No prior study on file for comparison. Performing Technologist: Sharion Dove RVS  Examination Guidelines: A complete evaluation includes B-mode imaging, spectral Doppler, color Doppler, and power Doppler as needed of all accessible portions of each vessel. Bilateral testing is considered an integral part of a complete examination. Limited examinations for reoccurring indications may be performed as noted.  +-----+---------------+---------+-----------+----------+-------+  RIGHT Compressibility Phasicity Spontaneity Properties Summary  +-----+---------------+---------+-----------+----------+-------+  CFV   Full            Yes       Yes                             +-----+---------------+---------+-----------+----------+-------+   +---------+---------------+---------+-----------+----------+-------+  LEFT      Compressibility Phasicity Spontaneity Properties Summary  +---------+---------------+---------+-----------+----------+-------+  CFV       Full            Yes       Yes                             +---------+---------------+---------+-----------+----------+-------+  SFJ       Full                                                      +---------+---------------+---------+-----------+----------+-------+  FV Prox   Full                                                      +---------+---------------+---------+-----------+----------+-------+  FV Mid    Full                                                      +---------+---------------+---------+-----------+----------+-------+  FV Distal Full                                                      +---------+---------------+---------+-----------+----------+-------+  PFV       Full                                                      +---------+---------------+---------+-----------+----------+-------+  POP       Full            Yes       Yes                              +---------+---------------+---------+-----------+----------+-------+  PTV       Full                                                      +---------+---------------+---------+-----------+----------+-------+  PERO      Full                                                      +---------+---------------+---------+-----------+----------+-------+     Summary: Right: No evidence of common femoral vein obstruction. Left: There is no evidence of deep vein thrombosis in the lower extremity.  *See table(s) above for measurements and observations. Electronically signed by Ruta Hinds MD on 03/14/2019  at 5:16:33 PM.    Final    [place] Ekg Site Rite  Result Date: 03/15/2019 If Site Rite image not attached, placement could not be confirmed due to current cardiac rhythm.  [place] Ekg Site Rite  Result Date: 03/14/2019 If Site Rite image not attached, placement could not be confirmed due to current cardiac rhythm.   Cardiac Studies   Cardiac CT angiogram:  Resulted  Echo LVEF normal  Mild diastolic dysfuncton   Patient Profile     53 y.o. female *Hx of afib, HTN, CVID, prediabetes admitted with CP  Assessment & Plan    1  CP  Hx is somewhat atypical but concerning .   Pt has risk factors for CAD (prediabetes, morbid obesity) Echo just done  CT not able to be done aside from calcium score yesterday due to IV problems   Plan for today  CA score 4    2  Lipids   LDL 150   HDL 59 Started Crestor yesterday   Follow lipids as outpt  3  Autonomic dysfunction  Pt with evid of POTS on orthostatic check   Placed on bystolic yesterday   Will follow as outpt   Encouraged her to stay active, increase conditioning, hydrate     4  CAD   Minimal plaquing of LAD   Plan on statin    5  Hx of atrial fibrillation .  Pt had one spell only per her report   [facility] Majestic to [facility]  Seen in ED.   Patient says she woke up with it that day   Resolved/terminated on own She has had no spells here, no documented events in our hosp  system   I have asked pt to get copy of EKG and records from theat ED visit   I will have her sign a release when I see her in clinic to get  She has had no further spells   Based on this I would not anticoagulate   EC ASA 81 mg only.    I have asked her to consider an Apple 4 or 5 watch to capture in future    Will arrange for f/u in clinic ths summer   For questions or updates, please contact [place] Please consult [email or web address] for contact info under        Signed, Dorris Carnes, MD  03/16/2019, 10:53 AM

## 2019-03-16 NOTE — Progress Notes (Addendum)
At 1213----Discharge instructions reviewed with pt.  Copy of instructions given to pt. Filled scripts for new meds were given to pt by [facility]. Pt waiting for husband to arrive for discharge.   At 1231 ----Pt d/c'd via wheelchair with belongings, with husband waiting at main entrance.            Escorted by unit staff.

## 2019-03-18 MED FILL — PRAMIPEXOLE 0.25 MG TABLET: 0.25 | 30 days supply | Qty: 30 | Fill #4

## 2019-03-18 MED FILL — DILTIAZEM 24HR CD 120 MG CA: 120 | 30 days supply | Qty: 30 | Fill #4

## 2019-03-18 MED FILL — VITAMIN D3 50000 UNIT CAPS: 1.25 MG | 28 days supply | Qty: 8 | Fill #4

## 2019-03-19 MED FILL — DULoxetine HCL 30 MG CPEP: 30 | 30 days supply | Qty: 60 | Fill #3

## 2019-03-19 MED FILL — tiZANidine HCL 4 MG TABS: 4 | 30 days supply | Qty: 30 | Fill #4

## 2019-03-21 DIAGNOSIS — E782 Mixed hyperlipidemia: Secondary | ICD-10-CM | POA: Diagnosis not present

## 2019-03-21 DIAGNOSIS — I10 Essential (primary) hypertension: Secondary | ICD-10-CM | POA: Diagnosis not present

## 2019-03-21 DIAGNOSIS — I4891 Unspecified atrial fibrillation: Secondary | ICD-10-CM | POA: Diagnosis not present

## 2019-03-21 DIAGNOSIS — R002 Palpitations: Secondary | ICD-10-CM | POA: Diagnosis not present

## 2019-04-04 DIAGNOSIS — D801 Nonfamilial hypogammaglobulinemia: Secondary | ICD-10-CM | POA: Diagnosis not present

## 2019-04-11 MED FILL — ROSUVASTATIN CALCIUM 10 MG: 10 | 90 days supply | Qty: 90 | Fill #0

## 2019-04-11 MED FILL — BYSTOLIC 2.5 MG TABLET: 2.5 | 90 days supply | Qty: 90 | Fill #0

## 2019-04-12 DIAGNOSIS — M47816 Spondylosis without myelopathy or radiculopathy, lumbar region: Secondary | ICD-10-CM | POA: Diagnosis not present

## 2019-04-12 DIAGNOSIS — M5136 Other intervertebral disc degeneration, lumbar region: Secondary | ICD-10-CM | POA: Diagnosis not present

## 2019-04-12 DIAGNOSIS — S76211S Strain of adductor muscle, fascia and tendon of right thigh, sequela: Secondary | ICD-10-CM | POA: Diagnosis not present

## 2019-04-12 DIAGNOSIS — S76211D Strain of adductor muscle, fascia and tendon of right thigh, subsequent encounter: Secondary | ICD-10-CM | POA: Diagnosis not present

## 2019-04-12 DIAGNOSIS — M545 Low back pain: Secondary | ICD-10-CM | POA: Diagnosis not present

## 2019-04-12 DIAGNOSIS — M879 Osteonecrosis, unspecified: Secondary | ICD-10-CM | POA: Diagnosis not present

## 2019-04-12 DIAGNOSIS — G8929 Other chronic pain: Secondary | ICD-10-CM | POA: Diagnosis not present

## 2019-04-12 DIAGNOSIS — M1612 Unilateral primary osteoarthritis, left hip: Secondary | ICD-10-CM | POA: Diagnosis not present

## 2019-04-12 DIAGNOSIS — M5416 Radiculopathy, lumbar region: Secondary | ICD-10-CM | POA: Diagnosis not present

## 2019-04-12 MED FILL — BACLOFEN 10 MG TABS: 10 | 15 days supply | Qty: 45 | Fill #0

## 2019-04-12 MED FILL — TOPIRAMATE 50 MG TABLET: 50 | 23 days supply | Qty: 60 | Fill #0

## 2019-04-19 DIAGNOSIS — E669 Obesity, unspecified: Secondary | ICD-10-CM | POA: Diagnosis not present

## 2019-04-19 DIAGNOSIS — Z96642 Presence of left artificial hip joint: Secondary | ICD-10-CM | POA: Diagnosis not present

## 2019-04-19 DIAGNOSIS — Z888 Allergy status to other drugs, medicaments and biological substances status: Secondary | ICD-10-CM | POA: Diagnosis not present

## 2019-04-19 DIAGNOSIS — I10 Essential (primary) hypertension: Secondary | ICD-10-CM | POA: Diagnosis not present

## 2019-04-19 DIAGNOSIS — Z887 Allergy status to serum and vaccine status: Secondary | ICD-10-CM | POA: Diagnosis not present

## 2019-04-19 DIAGNOSIS — D801 Nonfamilial hypogammaglobulinemia: Secondary | ICD-10-CM | POA: Diagnosis not present

## 2019-04-19 DIAGNOSIS — Z9889 Other specified postprocedural states: Secondary | ICD-10-CM | POA: Diagnosis not present

## 2019-04-19 DIAGNOSIS — Z79899 Other long term (current) drug therapy: Secondary | ICD-10-CM | POA: Diagnosis not present

## 2019-04-19 DIAGNOSIS — Z7982 Long term (current) use of aspirin: Secondary | ICD-10-CM | POA: Diagnosis not present

## 2019-04-28 DIAGNOSIS — Z9889 Other specified postprocedural states: Secondary | ICD-10-CM | POA: Diagnosis not present

## 2019-04-28 DIAGNOSIS — Z96642 Presence of left artificial hip joint: Secondary | ICD-10-CM | POA: Diagnosis not present

## 2019-04-28 DIAGNOSIS — Z7982 Long term (current) use of aspirin: Secondary | ICD-10-CM | POA: Diagnosis not present

## 2019-04-28 DIAGNOSIS — Z887 Allergy status to serum and vaccine status: Secondary | ICD-10-CM | POA: Diagnosis not present

## 2019-04-28 DIAGNOSIS — Z79899 Other long term (current) drug therapy: Secondary | ICD-10-CM | POA: Diagnosis not present

## 2019-04-28 DIAGNOSIS — E669 Obesity, unspecified: Secondary | ICD-10-CM | POA: Diagnosis not present

## 2019-04-28 DIAGNOSIS — Z888 Allergy status to other drugs, medicaments and biological substances status: Secondary | ICD-10-CM | POA: Diagnosis not present

## 2019-04-28 DIAGNOSIS — D801 Nonfamilial hypogammaglobulinemia: Secondary | ICD-10-CM | POA: Diagnosis not present

## 2019-04-28 DIAGNOSIS — I10 Essential (primary) hypertension: Secondary | ICD-10-CM | POA: Diagnosis not present

## 2019-04-29 MED FILL — PRAMIPEXOLE 0.25 MG TABLET: 0.25 | 30 days supply | Qty: 30 | Fill #0

## 2019-05-06 MED FILL — BACLOFEN 10 MG TABS: 10 | 15 days supply | Qty: 45 | Fill #1

## 2019-05-06 MED FILL — TOPIRAMATE 50 MG TABLET: 50 | 23 days supply | Qty: 60 | Fill #1

## 2019-05-09 DIAGNOSIS — D801 Nonfamilial hypogammaglobulinemia: Secondary | ICD-10-CM | POA: Diagnosis not present

## 2019-05-10 DIAGNOSIS — M47816 Spondylosis without myelopathy or radiculopathy, lumbar region: Secondary | ICD-10-CM | POA: Diagnosis not present

## 2019-05-10 DIAGNOSIS — M5416 Radiculopathy, lumbar region: Secondary | ICD-10-CM | POA: Diagnosis not present

## 2019-05-10 DIAGNOSIS — M545 Low back pain: Secondary | ICD-10-CM | POA: Diagnosis not present

## 2019-05-10 DIAGNOSIS — G5711 Meralgia paresthetica, right lower limb: Secondary | ICD-10-CM | POA: Diagnosis not present

## 2019-05-10 DIAGNOSIS — S76211D Strain of adductor muscle, fascia and tendon of right thigh, subsequent encounter: Secondary | ICD-10-CM | POA: Diagnosis not present

## 2019-05-10 DIAGNOSIS — M879 Osteonecrosis, unspecified: Secondary | ICD-10-CM | POA: Diagnosis not present

## 2019-05-10 DIAGNOSIS — S76211S Strain of adductor muscle, fascia and tendon of right thigh, sequela: Secondary | ICD-10-CM | POA: Diagnosis not present

## 2019-05-10 DIAGNOSIS — M769 Unspecified enthesopathy, lower limb, excluding foot: Secondary | ICD-10-CM | POA: Diagnosis not present

## 2019-05-10 DIAGNOSIS — M1612 Unilateral primary osteoarthritis, left hip: Secondary | ICD-10-CM | POA: Diagnosis not present

## 2019-05-10 DIAGNOSIS — G8929 Other chronic pain: Secondary | ICD-10-CM | POA: Diagnosis not present

## 2019-05-10 MED FILL — TOPIRAMATE 100 MG TABLET: 100 | 30 days supply | Qty: 90 | Fill #0

## 2019-05-12 DIAGNOSIS — H6590 Unspecified nonsuppurative otitis media, unspecified ear: Secondary | ICD-10-CM | POA: Diagnosis not present

## 2019-05-12 DIAGNOSIS — G5710 Meralgia paresthetica, unspecified lower limb: Secondary | ICD-10-CM | POA: Diagnosis not present

## 2019-05-12 DIAGNOSIS — M791 Myalgia, unspecified site: Secondary | ICD-10-CM | POA: Diagnosis not present

## 2019-05-12 DIAGNOSIS — M353 Polymyalgia rheumatica: Secondary | ICD-10-CM | POA: Diagnosis not present

## 2019-05-23 ENCOUNTER — Ambulatory Visit: Payer: 59 | Admitting: Internal Medicine

## 2019-05-24 MED FILL — DULoxetine HCL 30 MG CPEP: 30 | 30 days supply | Qty: 60 | Fill #4

## 2019-05-24 MED FILL — VITAMIN D3 50000 UNIT CAPS: 1.25 MG | 28 days supply | Qty: 8 | Fill #5

## 2019-06-13 DIAGNOSIS — D801 Nonfamilial hypogammaglobulinemia: Secondary | ICD-10-CM | POA: Diagnosis not present

## 2019-06-21 MED FILL — tiZANidine HCL 4 MG TABS: 4 | 30 days supply | Qty: 30 | Fill #0

## 2019-06-21 MED FILL — DULoxetine HCL 30 MG CPEP: 30 | 30 days supply | Qty: 60 | Fill #0

## 2019-06-21 MED FILL — PRAMIPEXOLE 0.25 MG TABLET: 0.25 | 30 days supply | Qty: 30 | Fill #1

## 2019-06-21 MED FILL — VITAMIN D3 50000 UNIT CAPS: 1.25 MG | 28 days supply | Qty: 8 | Fill #0

## 2019-06-21 MED FILL — TOPIRAMATE 100 MG TABLET: 100 | 30 days supply | Qty: 90 | Fill #1

## 2019-07-08 DIAGNOSIS — Z8572 Personal history of non-Hodgkin lymphomas: Secondary | ICD-10-CM | POA: Diagnosis not present

## 2019-07-08 DIAGNOSIS — M1990 Unspecified osteoarthritis, unspecified site: Secondary | ICD-10-CM | POA: Diagnosis not present

## 2019-07-08 DIAGNOSIS — I10 Essential (primary) hypertension: Secondary | ICD-10-CM | POA: Diagnosis not present

## 2019-07-08 DIAGNOSIS — R1031 Right lower quadrant pain: Secondary | ICD-10-CM | POA: Diagnosis not present

## 2019-07-11 DIAGNOSIS — D801 Nonfamilial hypogammaglobulinemia: Secondary | ICD-10-CM | POA: Diagnosis not present

## 2019-07-25 MED FILL — BYSTOLIC 2.5 MG TABLET: 2.5 | 90 days supply | Qty: 90 | Fill #1

## 2019-07-25 MED FILL — PRAMIPEXOLE 0.25 MG TABLET: 0.25 | 30 days supply | Qty: 30 | Fill #0

## 2019-07-25 MED FILL — DULoxetine HCL 30 MG CPEP: 30 | 30 days supply | Qty: 60 | Fill #1

## 2019-07-25 MED FILL — tiZANidine HCL 4 MG TABS: 4 | 30 days supply | Qty: 30 | Fill #1

## 2019-07-25 MED FILL — VITAMIN D3 50000 UNIT CAPS: 1.25 MG | 28 days supply | Qty: 8 | Fill #1

## 2019-08-12 DIAGNOSIS — N3281 Overactive bladder: Secondary | ICD-10-CM | POA: Diagnosis not present

## 2019-08-12 DIAGNOSIS — R102 Pelvic and perineal pain: Secondary | ICD-10-CM | POA: Diagnosis not present

## 2019-08-12 DIAGNOSIS — Z6841 Body Mass Index (BMI) 40.0 and over, adult: Secondary | ICD-10-CM | POA: Diagnosis not present

## 2019-08-12 DIAGNOSIS — R3915 Urgency of urination: Secondary | ICD-10-CM | POA: Diagnosis not present

## 2019-08-12 MED FILL — MYRBETRIQ ER 50 MG TABLET: 50 | 30 days supply | Qty: 30 | Fill #0

## 2019-08-17 DIAGNOSIS — D801 Nonfamilial hypogammaglobulinemia: Secondary | ICD-10-CM | POA: Diagnosis not present

## 2019-08-29 MED FILL — tiZANidine HCL 4 MG TABS: 4 | 30 days supply | Qty: 30 | Fill #2

## 2019-08-29 MED FILL — DULoxetine HCL 30 MG CPEP: 30 | 30 days supply | Qty: 60 | Fill #2

## 2019-08-29 MED FILL — VITAMIN D3 50000 UNIT CAPS: 1.25 MG | 28 days supply | Qty: 8 | Fill #2

## 2019-08-29 MED FILL — PRAMIPEXOLE 0.25 MG TABLET: 0.25 | 30 days supply | Qty: 30 | Fill #1

## 2019-09-13 DIAGNOSIS — Z90710 Acquired absence of both cervix and uterus: Secondary | ICD-10-CM | POA: Diagnosis not present

## 2019-09-13 DIAGNOSIS — R102 Pelvic and perineal pain: Secondary | ICD-10-CM | POA: Diagnosis not present

## 2019-09-15 DIAGNOSIS — D801 Nonfamilial hypogammaglobulinemia: Secondary | ICD-10-CM | POA: Diagnosis not present

## 2019-09-19 MED FILL — MYRBETRIQ ER 50 MG TABLET: 50 | 30 days supply | Qty: 30 | Fill #1

## 2019-10-07 MED FILL — VITAMIN D3 50,000 UNITS CAP: 1.25 MG | 28 days supply | Qty: 8 | Fill #3

## 2019-10-07 MED FILL — DULoxetine HCL 30 MG CPEP: 30 | 30 days supply | Qty: 60 | Fill #3

## 2019-10-07 MED FILL — tiZANidine HCL 4 MG TABS: 4 | 30 days supply | Qty: 30 | Fill #3

## 2019-10-07 MED FILL — PRAMIPEXOLE 0.25 MG TABLET: 0.25 | 30 days supply | Qty: 30 | Fill #2

## 2019-10-31 DIAGNOSIS — D801 Nonfamilial hypogammaglobulinemia: Secondary | ICD-10-CM | POA: Diagnosis not present

## 2019-11-04 MED FILL — DULoxetine HCL 30 MG CPEP: 30 | 30 days supply | Qty: 60 | Fill #4

## 2019-11-04 MED FILL — BYSTOLIC 2.5 MG TABLET: 2.5 | 90 days supply | Qty: 90 | Fill #2

## 2019-11-04 MED FILL — VITAMIN D3 50,000 UNITS CAP: 1.25 MG | 28 days supply | Qty: 8 | Fill #4

## 2019-11-05 MED FILL — tiZANidine HCL 4 MG TABS: 4 | 30 days supply | Qty: 30 | Fill #0

## 2019-11-05 MED FILL — PRAMIPEXOLE 0.25 MG TABLET: 0.25 | 30 days supply | Qty: 30 | Fill #0

## 2019-11-10 DIAGNOSIS — N3281 Overactive bladder: Secondary | ICD-10-CM | POA: Diagnosis not present

## 2019-11-10 DIAGNOSIS — I480 Paroxysmal atrial fibrillation: Secondary | ICD-10-CM | POA: Diagnosis not present

## 2019-11-10 DIAGNOSIS — H6590 Unspecified nonsuppurative otitis media, unspecified ear: Secondary | ICD-10-CM | POA: Diagnosis not present

## 2019-11-10 DIAGNOSIS — M353 Polymyalgia rheumatica: Secondary | ICD-10-CM | POA: Diagnosis not present

## 2019-11-10 DIAGNOSIS — R002 Palpitations: Secondary | ICD-10-CM | POA: Diagnosis not present

## 2019-11-10 DIAGNOSIS — M791 Myalgia, unspecified site: Secondary | ICD-10-CM | POA: Diagnosis not present

## 2019-11-10 DIAGNOSIS — M8700 Idiopathic aseptic necrosis of unspecified bone: Secondary | ICD-10-CM | POA: Diagnosis not present

## 2019-11-10 DIAGNOSIS — Z0000 Encounter for general adult medical examination without abnormal findings: Secondary | ICD-10-CM | POA: Diagnosis not present

## 2019-11-10 DIAGNOSIS — C8307 Small cell B-cell lymphoma, spleen: Secondary | ICD-10-CM | POA: Diagnosis not present

## 2019-11-10 MED FILL — TOPIRAMATE 100 MG TABLET: 100 | 90 days supply | Qty: 270 | Fill #0

## 2019-11-19 MED FILL — MYRBETRIQ ER 50 MG TABLET: 50 | 30 days supply | Qty: 30 | Fill #0

## 2019-11-28 DIAGNOSIS — M353 Polymyalgia rheumatica: Secondary | ICD-10-CM | POA: Diagnosis not present

## 2019-11-28 DIAGNOSIS — Z713 Dietary counseling and surveillance: Secondary | ICD-10-CM | POA: Diagnosis not present

## 2019-11-28 DIAGNOSIS — I480 Paroxysmal atrial fibrillation: Secondary | ICD-10-CM | POA: Diagnosis not present

## 2019-11-28 DIAGNOSIS — M791 Myalgia, unspecified site: Secondary | ICD-10-CM | POA: Diagnosis not present

## 2019-12-05 MED FILL — VITAMIN D3 50,000 UNITS CAP: 1.25 MG | 28 days supply | Qty: 8 | Fill #5

## 2019-12-05 MED FILL — PRAMIPEXOLE 0.25 MG TABLET: 0.25 | 30 days supply | Qty: 30 | Fill #1

## 2019-12-05 MED FILL — tiZANidine HCL 4 MG TABS: 4 | 30 days supply | Qty: 30 | Fill #1

## 2019-12-08 DIAGNOSIS — N6324 Unspecified lump in the left breast, lower inner quadrant: Secondary | ICD-10-CM | POA: Diagnosis not present

## 2019-12-08 DIAGNOSIS — N644 Mastodynia: Secondary | ICD-10-CM | POA: Diagnosis not present

## 2019-12-08 DIAGNOSIS — N62 Hypertrophy of breast: Secondary | ICD-10-CM | POA: Diagnosis not present

## 2019-12-09 MED FILL — PHENTERMINE 37.5 MG CAPSULE: 37.5 | 30 days supply | Qty: 30 | Fill #0

## 2019-12-19 DIAGNOSIS — I340 Nonrheumatic mitral (valve) insufficiency: Secondary | ICD-10-CM | POA: Diagnosis not present

## 2019-12-19 DIAGNOSIS — D801 Nonfamilial hypogammaglobulinemia: Secondary | ICD-10-CM | POA: Diagnosis not present

## 2019-12-19 DIAGNOSIS — I739 Peripheral vascular disease, unspecified: Secondary | ICD-10-CM | POA: Diagnosis not present

## 2019-12-19 DIAGNOSIS — I4891 Unspecified atrial fibrillation: Secondary | ICD-10-CM | POA: Diagnosis not present

## 2019-12-19 DIAGNOSIS — I10 Essential (primary) hypertension: Secondary | ICD-10-CM | POA: Diagnosis not present

## 2019-12-28 DIAGNOSIS — N644 Mastodynia: Secondary | ICD-10-CM | POA: Diagnosis not present

## 2019-12-28 DIAGNOSIS — R928 Other abnormal and inconclusive findings on diagnostic imaging of breast: Secondary | ICD-10-CM | POA: Diagnosis not present

## 2019-12-30 MED FILL — tiZANidine HCL 4 MG TABS: 4 | 30 days supply | Qty: 30 | Fill #2

## 2019-12-30 MED FILL — PRAMIPEXOLE 0.25 MG TABLET: 0.25 | 30 days supply | Qty: 30 | Fill #2

## 2019-12-30 MED FILL — MYRBETRIQ ER 50 MG TABLET: 50 | 30 days supply | Qty: 30 | Fill #1

## 2020-01-04 ENCOUNTER — Ambulatory Visit: Payer: 59 | Admitting: Family Medicine

## 2020-01-04 ENCOUNTER — Encounter: Payer: Self-pay | Admitting: Family Medicine

## 2020-01-04 ENCOUNTER — Other Ambulatory Visit: Payer: Self-pay

## 2020-01-04 DIAGNOSIS — M5441 Lumbago with sciatica, right side: Secondary | ICD-10-CM

## 2020-01-04 DIAGNOSIS — M79604 Pain in right leg: Secondary | ICD-10-CM | POA: Diagnosis not present

## 2020-01-04 DIAGNOSIS — I739 Peripheral vascular disease, unspecified: Secondary | ICD-10-CM | POA: Insufficient documentation

## 2020-01-04 MED ORDER — GABAPENTIN 100 MG PO CAPS: ORAL_CAPSULE | ORAL | 3 refills | Status: DC

## 2020-01-04 MED FILL — GABAPENTIN 100 MG CAPSULE: 100 | 30 days supply | Qty: 90 | Fill #0

## 2020-01-04 NOTE — Progress Notes (Signed)
Office Visit Note   Patient: Monique Clark           Date of Birth: 1966-01-12           MRN: [id number] Visit Date: 01/04/2020 Requested by: Lonia Mad, MD No address on file PCP: Lonia Mad, MD  Subjective: Chief Complaint  Patient presents with  . Right Leg - Pain    Pain starting in posterior hip/buttock and shoots down the lateral leg to the calf and foot - w/numbness and tingling x 2 months. NKI. No pain across the lower back. Has PMR.    HPI: She is here with right leg pain.  Symptoms started about 2 months ago, no injury.  Pain from the posterior hip with radiation down the leg into the foot.  She has had numbness and tingling in her foot worse when standing, but pretty much constantly.  Denies any low back pain, denies any history of rash, denies fevers or chills.  She has not noticed any weakness in her leg, no bowel or bladder dysfunction.  She has a history of PMR for which she takes prednisone.  This pain is different.  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  She had to go there because nobody else would do it due to her BMI.              ROS:   All other systems were reviewed and are negative.  Objective: Vital Signs: There were no vitals taken for this visit.  Physical Exam:  General:  Alert and oriented, in no acute distress. Pulm:  Breathing unlabored. Psy:  Normal mood, congruent affect.  Low back: No significant tenderness along the lumbar spine or the SI joint.  She is tender in the right sciatic notch.  No pain over the greater trochanter, no pain with internal hip rotation.  Negative straight leg raise, lower extremity strength and reflexes are normal.  Imaging: None today  Assessment & Plan: 1.  Right-sided sciatica, suspicious for foraminal stenosis -We will try physical therapy.  Gabapentin for her pain. -If she fails to improve we will order x-rays lumbar spine and MRI  scan.     Procedures: No procedures performed  No notes on file     PMFS History: Patient Active Problem List   Diagnosis Date Noted  . Paroxysmal atrial fibrillation (HCC)   . Localized swelling of left lower extremity   . Chest pain 03/13/2019  . Lightheadedness   . Essential hypertension   . BMI 45.0-49.9, adult ([place]) 12/28/2015  . Benign hypertension 12/12/2015  . CVID (common variable immunodeficiency) ([place]) 05/07/2015  . Post-nasal discharge 03/30/2013  . Prediabetes 01/20/2013  . S/P splenectomy 01/20/2013  . History of non-Hodgkin's lymphoma 12/21/2012  . Low back pain radiating to right lower extremity 12/10/2012  . Right lumbar radiculitis 12/10/2012   Past Medical History:  Diagnosis Date  . Abscess   . Arthritis   . Atrial fibrillation ([place])   . Cardiac arrhythmia due to congenital heart disease   . CVID (common variable immunodeficiency) ([place]) 2015  . Diverticulosis   . Elevated cholesterol   . High blood pressure   . Non Hodgkin's lymphoma ([place])   . Obesity   . Pneumonia   . UTI (urinary tract infection)     History reviewed. No pertinent family history.  Past Surgical History:  Procedure Laterality Date  . ABDOMINAL HYSTERECTOMY    .  APPENDECTOMY    . CESAREAN SECTION    . GALLBLADDER SURGERY    . JOINT REPLACEMENT Left    hip  . left shoulder repair    . LUNG REMOVAL, PARTIAL    . myringostomy Bilateral   . SPLENECTOMY, TOTAL    . TONSILLECTOMY AND ADENOIDECTOMY     Social History   Occupational History  . Occupation: rn  Tobacco Use  . Smoking status: Former Smoker    Packs/day: 0.50    Types: Cigarettes    Quit date: 09/29/2002    Years since quitting: 17.2  . Smokeless tobacco: Never Used  Substance and Sexual Activity  . Alcohol use: No  . Drug use: No  . Sexual activity: Not on file

## 2020-01-05 ENCOUNTER — Other Ambulatory Visit (HOSPITAL_COMMUNITY): Payer: Self-pay | Admitting: Internal Medicine

## 2020-01-05 MED FILL — DULoxetine HCL 30 MG CPEP: 30 | 30 days supply | Qty: 60 | Fill #0

## 2020-01-09 ENCOUNTER — Encounter: Payer: Self-pay | Admitting: Physical Therapy

## 2020-01-09 ENCOUNTER — Ambulatory Visit: Payer: 59 | Admitting: Physical Therapy

## 2020-01-09 ENCOUNTER — Other Ambulatory Visit: Payer: Self-pay

## 2020-01-09 DIAGNOSIS — M5441 Lumbago with sciatica, right side: Secondary | ICD-10-CM | POA: Diagnosis not present

## 2020-01-09 DIAGNOSIS — M79604 Pain in right leg: Secondary | ICD-10-CM | POA: Diagnosis not present

## 2020-01-09 NOTE — Patient Instructions (Signed)
Access Code: [email or web address]: [email or web address]: 04/12/2021Prepared by: Aaron Edelman NelsonExercises  Slump Stretch - 2 x daily - 6 x weekly - 10 reps - 1-2 sets - 3 hold  Lateral Shift Correction at Wall - 2 x daily - 6 x weekly - 1-4 sets - 10 reps  Standing Lumbar Extension - 2 x daily - 6 x weekly - 1-4 sets - 10 reps - 5 hold  Static Prone on Elbows - 2 x daily - 6 x weekly - 3-5 sets - 60 sec hold  Supine Piriformis Stretch with Foot on Ground - 2 x daily - 6 x weekly - 3 sets - 30 hold  Supine Bridge - 2 x daily - 6 x weekly - 10 reps - 1-2 sets - 5 hold

## 2020-01-09 NOTE — Therapy (Signed)
[facility] [address] [place], [place], [address] Phone: [phone]   Fax:  [phone]  Physical Therapy Evaluation  Patient Details  Name: Monique Clark MRN: [id number] Date of Birth: January 05, 1966 Referring Provider (PT): Hilts, MD   Encounter Date: 01/09/2020  PT End of Session - 01/09/20 1313    Visit Number  1    Number of Visits  12    Date for PT Re-Evaluation  02/20/20    PT Start Time  1108    PT Stop Time  1150    PT Time Calculation (min)  42 min    Activity Tolerance  Patient tolerated treatment well    Behavior During Therapy  [facility] for tasks assessed/performed       Past Medical History:  Diagnosis Date  . Abscess   . Arthritis   . Atrial fibrillation ([place])   . Cardiac arrhythmia due to congenital heart disease   . CVID (common variable immunodeficiency) ([place]) 2015  . Diverticulosis   . Elevated cholesterol   . High blood pressure   . Non Hodgkin's lymphoma ([place])   . Obesity   . Pneumonia   . UTI (urinary tract infection)     Past Surgical History:  Procedure Laterality Date  . ABDOMINAL HYSTERECTOMY    . APPENDECTOMY    . CESAREAN SECTION    . GALLBLADDER SURGERY    . JOINT REPLACEMENT Left    hip  . left shoulder repair    . LUNG REMOVAL, PARTIAL    . myringostomy Bilateral   . SPLENECTOMY, TOTAL    . TONSILLECTOMY AND ADENOIDECTOMY      There were no vitals filed for this visit.   Subjective Assessment - 01/09/20 1109    Subjective  She has chief complaint of Rt leg Pain starting in posterior hip/buttock and shoots down the lateral leg to the calf and foot - w/numbness and tingling x 2 months. NKI. 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 She has been having pain with Numbness, burning in her Rt buttock that goes down her Rt leg and gets worse with standing, walking, stairs. She is having some urinary frequency but no loss of bowel or bladder control. She  does relay some sexual difficuly as well that started when this leg pain stated about 2 months ago.    Pertinent History  PMH: Lt THA, Afib, obesity, HTN,    Limitations  Lifting;Standing;Walking    How long can you stand comfortably?  less than 5 min    How long can you walk comfortably?  Less than 5 min    Diagnostic tests  no recent imaging    Patient Stated Goals  relieve the leg pain    Currently in Pain?  Yes    Pain Score  4     Pain Location  Leg    Pain Orientation  Right    Pain Descriptors / Indicators  Burning;Tingling;Numbness    Pain Type  Acute pain    Pain Radiating Towards  down her right leg into her calf and foot    Pain Onset  More than a month ago    Pain Frequency  Intermittent    Aggravating Factors   standing, walking, stairs    Pain Relieving Factors  sitting, laying down, gabapentin         [facility] PT Assessment - 01/09/20 0001      Assessment   Medical  Diagnosis  Rt sciatica    Referring Provider (PT)  Hilts, MD    Onset Date/Surgical Date  --   2 month onset of pain   Next MD Visit  PRN    Prior Therapy  nothing recent      Precautions   Precautions  None      Restrictions   Weight Bearing Restrictions  No      Balance Screen   Has the patient fallen in the past 6 months  No      [place] residence    Additional Comments  has a few steps at home with handrail      Prior Function   Level of Independence  Independent    Vocation  Full time employment    Occupational psychologist care managment    Leisure  bowling, walking      Cognition   Overall Cognitive Status  Within Functional Limits for tasks assessed      ROM / Strength   AROM / PROM / Strength  AROM;Strength      AROM   AROM Assessment Site  Lumbar    Lumbar Flexion  WNL    Lumbar Extension  75%    Lumbar - Right Side Bend  75%    Lumbar - Left Side Bend  75%    Lumbar - Right Rotation  50%    Lumbar - Left Rotation  50%       Strength   Overall Strength Comments  leg strength bilat 5/5 except hip flexion 4/5 bilat      Special Tests   Other special tests  latreal trunk shift Rt?, some decreased radiculopathy with repeated extensions and lateral shift correction both Rt and Lt. decreased radiculopathy with prone on elbows, no change in radicular symptoms with repeated flexion. + slump test on Rt , negative SLR. bilat      Ambulation/Gait   Gait Comments  ambulates without AD, slower velocity and decreased step length and Rt stance time due to leg pain/numbness                Objective measurements completed on examination: See above findings.              PT Education - 01/09/20 1313    Education Details  HEP, exam findings, POC    Person(s) Educated  Patient    Methods  Explanation;Demonstration;Verbal cues;Handout    Comprehension  Verbalized understanding;Need further instruction          PT Long Term Goals - 01/09/20 1321      PT LONG TERM GOAL #1   Title  Pt will be I and compliant with HEP. (target date for all goals 6 weeks 02/20/20)    Time  6    Period  Weeks    Status  New    Target Date  02/20/20      PT LONG TERM GOAL #2   Title  Pt will improve Rt leg pain/radiculopathy to overall less than 3/10 with ususal activity.    Status  New      PT LONG TERM GOAL #3   Title  Pt will improve standing/walking tolerance to >20 minutes    Status  New      PT LONG TERM GOAL #4   Title  Pt will improve lumbar AROM to WNL without complaints of pain.    Status  New  Plan - 01/09/20 1315    Clinical Impression Statement  Pt presents with signs and symptoms consistent of Rt sided sciatica. She had some centralization today with repeated extension and no change with repeated flexion. She does have slight lateral trunk shift. Pain is worse with standing, walking, or stairs and is limiting her full function. She will benefit from skilled PT interventions listed  below to address her functional deficits listed below.    Personal Factors and Comorbidities  Comorbidity 3+    Comorbidities  PMH: Lt THA 2 years ago, Afib, obesity, HTN,    Examination-Activity Limitations  Stairs;Stand;Lift;Squat;Sleep    Examination-Participation Restrictions  Cleaning;Community Activity;Laundry;Shop    Stability/Clinical Decision Making  Evolving/Moderate complexity    Clinical Decision Making  Moderate    Rehab Potential  Good    PT Frequency  2x / week    PT Duration  6 weeks    PT Treatment/Interventions  ADLs/Self Care Home Management;Electrical Stimulation;Moist Heat;Traction;Ultrasound;Gait training;Stair training;Therapeutic activities;Therapeutic exercise;Neuromuscular re-education;Balance training;Manual techniques;Manual lymph drainage;Passive range of motion;Dry needling;Joint Manipulations;Spinal Manipulations    PT Next Visit Plan  review and update  HEP PRN, appered to favor extension during eval. Check trunk shift    PT Home Exercise Plan  Access Code:[email or web address]    Consulted and Agree with Plan of Care  Patient       Patient will benefit from skilled therapeutic intervention in order to improve the following deficits and impairments:  Abnormal gait, Decreased activity tolerance, Decreased range of motion, Decreased strength, Difficulty walking, Postural dysfunction, Pain, Increased muscle spasms, Increased fascial restricitons  Visit Diagnosis: Acute right-sided low back pain with right-sided sciatica  Pain in right leg     Problem List Patient Active Problem List   Diagnosis Date Noted  . Paroxysmal atrial fibrillation (HCC)   . Localized swelling of left lower extremity   . Chest pain 03/13/2019  . Lightheadedness   . Essential hypertension   . BMI 45.0-49.9, adult ([place]) 12/28/2015  . Benign hypertension 12/12/2015  . CVID (common variable immunodeficiency) ([place]) 05/07/2015  . Post-nasal discharge 03/30/2013  . Prediabetes 01/20/2013  .  S/P splenectomy 01/20/2013  . History of non-Hodgkin's lymphoma 12/21/2012  . Low back pain radiating to right lower extremity 12/10/2012  . Right lumbar radiculitis 12/10/2012    Silvestre Mesi 01/09/2020, 1:23 PM  [facility] [address] [place], [place], [address] Phone: [phone]   Fax:  [phone]  Name: Monique Clark MRN: [id number] Date of Birth: 1966/07/29

## 2020-01-11 ENCOUNTER — Encounter: Payer: Self-pay | Admitting: Physical Therapy

## 2020-01-11 ENCOUNTER — Other Ambulatory Visit: Payer: Self-pay

## 2020-01-11 ENCOUNTER — Ambulatory Visit (INDEPENDENT_AMBULATORY_CARE_PROVIDER_SITE_OTHER): Payer: 59 | Admitting: Physical Therapy

## 2020-01-11 DIAGNOSIS — M79604 Pain in right leg: Secondary | ICD-10-CM

## 2020-01-11 DIAGNOSIS — M5441 Lumbago with sciatica, right side: Secondary | ICD-10-CM | POA: Diagnosis not present

## 2020-01-11 NOTE — Therapy (Signed)
[facility] [place], [place], [address] Phone: [phone]   Fax:  [phone]  Physical Therapy Treatment  Patient Details  Name: Monique Clark MRN: [id number] Date of Birth: 1965-10-05 Referring Provider (PT): Hilts, MD   Encounter Date: 01/11/2020  PT End of Session - 01/11/20 0813    Visit Number  2    Number of Visits  12    Date for PT Re-Evaluation  02/20/20    PT Start Time  0800    PT Stop Time  0845    PT Time Calculation (min)  45 min    Activity Tolerance  Patient tolerated treatment well    Behavior During Therapy  [facility] for tasks assessed/performed       Past Medical History:  Diagnosis Date  . Abscess   . Arthritis   . Atrial fibrillation ([place])   . Cardiac arrhythmia due to congenital heart disease   . CVID (common variable immunodeficiency) ([place]) 2015  . Diverticulosis   . Elevated cholesterol   . High blood pressure   . Non Hodgkin's lymphoma ([place])   . Obesity   . Pneumonia   . UTI (urinary tract infection)     Past Surgical History:  Procedure Laterality Date  . ABDOMINAL HYSTERECTOMY    . APPENDECTOMY    . CESAREAN SECTION    . GALLBLADDER SURGERY    . JOINT REPLACEMENT Left    hip  . left shoulder repair    . LUNG REMOVAL, PARTIAL    . myringostomy Bilateral   . SPLENECTOMY, TOTAL    . TONSILLECTOMY AND ADENOIDECTOMY      There were no vitals filed for this visit.  Subjective Assessment - 01/11/20 0809    Subjective  Pt arriving today reporting 5/10 LBP more on R side and extending into right glutes. Pt reporting having difficulty sleeping and getting comfortable.    Pertinent History  PMH: Lt THA, Afib, obesity, HTN,    Limitations  Lifting;Standing;Walking    How long can you stand comfortably?  less than 5 min    How long can you walk comfortably?  Less than 5 min    Patient Stated Goals  relieve the leg pain    Currently in Pain?  Yes    Pain Score  5     Pain Location  Back     Pain Orientation  Right    Pain Descriptors / Indicators  Aching;Burning    Pain Type  Acute pain    Pain Onset  More than a month ago                       [facility] Adult PT Treatment/Exercise - 01/11/20 0001      Ambulation/Gait   Gait Comments  ambulates without AD, slower velocity and decreased step length and Rt stance time due to leg pain/numbness      Exercises   Exercises  Lumbar      Lumbar Exercises: Stretches   Standing Extension  2 reps;10 seconds    Prone on Elbows Stretch  3 reps;20 seconds    ITB Stretch  2 reps;20 seconds    Piriformis Stretch  Right;3 reps;20 seconds    Figure 4 Stretch  2 reps;20 seconds    Other Lumbar Stretch Exercise  Door stretches x 3 holding,         Lumbar Exercises: Standing   Row  AROM;Strengthening;10 reps;Limitations    Theraband Level (Row)  Level 2 (Red)    Row Limitations  2 sets      Lumbar Exercises: Supine   Clam  10 reps    Bridge  10 reps;5 seconds      Lumbar Exercises: Sidelying   Hip Abduction  Right;10 reps      Manual Therapy   Manual Therapy  Soft tissue mobilization    Manual therapy comments  8 minutes    Soft tissue mobilization  IT band and R gluteals, R piriformis             PT Education - 01/11/20 0812    Education Details  Exercise technique    Person(s) Educated  Patient    Methods  Explanation;Demonstration    Comprehension  Verbalized understanding;Returned demonstration          PT Long Term Goals - 01/11/20 0858      PT LONG TERM GOAL #1   Title  Pt will be I and compliant with HEP. (target date for all goals 6 weeks 02/20/20)    Time  6    Period  Weeks    Status  On-going      PT LONG TERM GOAL #2   Title  Pt will improve Rt leg pain/radiculopathy to overall less than 3/10 with ususal activity.    Status  On-going      PT LONG TERM GOAL #3   Title  Pt will improve standing/walking tolerance to >20 minutes    Status  On-going      PT LONG TERM GOAL #4    Title  Pt will improve lumbar AROM to WNL without complaints of pain.    Status  On-going            Plan - 01/11/20 [id number]    Clinical Impression Statement  Pt tolerating exercises well with concentration on stretching and core activation Pt reportring standing extension felt like there was pressure relief. Pt instructed to add to her HEP. Continue skilled PT.    Personal Factors and Comorbidities  Comorbidity 3+    Comorbidities  PMH: Lt THA 2 years ago, Afib, obesity, HTN,    Examination-Activity Limitations  Stairs;Stand;Lift;Squat;Sleep    Examination-Participation Restrictions  Cleaning;Community Activity;Laundry;Shop    Stability/Clinical Decision Making  Evolving/Moderate complexity    Rehab Potential  Good    PT Frequency  2x / week    PT Duration  6 weeks    PT Treatment/Interventions  ADLs/Self Care Home Management;Electrical Stimulation;Moist Heat;Traction;Ultrasound;Gait training;Stair training;Therapeutic activities;Therapeutic exercise;Neuromuscular re-education;Balance training;Manual techniques;Manual lymph drainage;Passive range of motion;Dry needling;Joint Manipulations;Spinal Manipulations    PT Next Visit Plan  review and update  HEP PRN, appered to favor extension during eval. Check trunk shift    PT Home Exercise Plan  Access Code:[email or web address], added standing extension and door stretches.    Consulted and Agree with Plan of Care  Patient       Patient will benefit from skilled therapeutic intervention in order to improve the following deficits and impairments:  Abnormal gait, Decreased activity tolerance, Decreased range of motion, Decreased strength, Difficulty walking, Postural dysfunction, Pain, Increased muscle spasms, Increased fascial restricitons  Visit Diagnosis: Pain in right leg  Acute right-sided low back pain with right-sided sciatica     Problem List Patient Active Problem List   Diagnosis Date Noted  . Paroxysmal atrial fibrillation (HCC)   .  Localized swelling of left lower extremity   . Chest pain 03/13/2019  . Lightheadedness   . Essential hypertension   .  BMI 45.0-49.9, adult ([place]) 12/28/2015  . Benign hypertension 12/12/2015  . CVID (common variable immunodeficiency) ([place]) 05/07/2015  . Post-nasal discharge 03/30/2013  . Prediabetes 01/20/2013  . S/P splenectomy 01/20/2013  . History of non-Hodgkin's lymphoma 12/21/2012  . Low back pain radiating to right lower extremity 12/10/2012  . Right lumbar radiculitis 12/10/2012    [name], MPT 01/11/2020, 8:59 AM  [facility] Physical Therapy [address] [place], [place], [address] Phone: [phone]   Fax:  [phone]  Name: Monique Clark MRN: [id number] Date of Birth: 12/17/1965

## 2020-01-12 MED FILL — PHENTERMINE 37.5 MG CAPSULE: 37.5 | 30 days supply | Qty: 30 | Fill #0

## 2020-01-17 ENCOUNTER — Encounter: Payer: Self-pay | Admitting: Physical Therapy

## 2020-01-17 ENCOUNTER — Ambulatory Visit (INDEPENDENT_AMBULATORY_CARE_PROVIDER_SITE_OTHER): Payer: 59 | Admitting: Physical Therapy

## 2020-01-17 ENCOUNTER — Other Ambulatory Visit: Payer: Self-pay

## 2020-01-17 DIAGNOSIS — M79604 Pain in right leg: Secondary | ICD-10-CM

## 2020-01-17 DIAGNOSIS — M5441 Lumbago with sciatica, right side: Secondary | ICD-10-CM

## 2020-01-17 NOTE — Therapy (Signed)
[facility] Physical Therapy [address] [place], [place], [address] Phone: [phone]   Fax:  [phone]  Physical Therapy Treatment  Patient Details  Name: Monique Clark MRN: [id number] Date of Birth: 1966-01-22 Referring Provider (PT): Hilts, MD   Encounter Date: 01/17/2020  PT End of Session - 01/17/20 0846    Visit Number  3    Number of Visits  12    Date for PT Re-Evaluation  02/20/20    PT Start Time  0803    PT Stop Time  0855    PT Time Calculation (min)  52 min    Activity Tolerance  Patient tolerated treatment well    Behavior During Therapy  [facility] for tasks assessed/performed       Past Medical History:  Diagnosis Date  . Abscess   . Arthritis   . Atrial fibrillation ([place])   . Cardiac arrhythmia due to congenital heart disease   . CVID (common variable immunodeficiency) ([place]) 2015  . Diverticulosis   . Elevated cholesterol   . High blood pressure   . Non Hodgkin's lymphoma ([place])   . Obesity   . Pneumonia   . UTI (urinary tract infection)     Past Surgical History:  Procedure Laterality Date  . ABDOMINAL HYSTERECTOMY    . APPENDECTOMY    . CESAREAN SECTION    . GALLBLADDER SURGERY    . JOINT REPLACEMENT Left    hip  . left shoulder repair    . LUNG REMOVAL, PARTIAL    . myringostomy Bilateral   . SPLENECTOMY, TOTAL    . TONSILLECTOMY AND ADENOIDECTOMY      There were no vitals filed for this visit.  Subjective Assessment - 01/17/20 0806    Subjective  Pt arriving to therapy reporting 3/10 pain in her low back. Pt reporting soreness/achiness from the exercises. No pain while performing them but some increased pain during the day. Pt reporting extension exercises make her feel better while she is performing them.    Pertinent History  PMH: Lt THA, Afib, obesity, HTN,    Limitations  Lifting;Standing;Walking    How long can you stand comfortably?  less than 5 min    How long can you walk comfortably?  Less than 5 min     Diagnostic tests  no recent imaging    Patient Stated Goals  relieve the leg pain    Currently in Pain?  Yes    Pain Score  3     Pain Location  Back    Pain Orientation  Right    Pain Descriptors / Indicators  Aching    Pain Type  Acute pain                       [facility] Adult PT Treatment/Exercise - 01/17/20 0001      Exercises   Exercises  Lumbar      Lumbar Exercises: Stretches   Standing Extension  2 reps;10 seconds    Prone on Elbows Stretch  3 reps;20 seconds    ITB Stretch  2 reps;20 seconds    Piriformis Stretch  Right;3 reps;20 seconds    Figure 4 Stretch  2 reps;20 seconds    Other Lumbar Stretch Exercise  Door stretches x 3 holding,         Lumbar Exercises: Aerobic   Nustep  L 5 7 minutes      Lumbar Exercises: Standing   Row  AROM;Strengthening;15 reps;Limitations;Other (comment)  Row Limitations  2 sets, resistance cords (pink)       Lumbar Exercises: Supine   Clam  10 reps    Clam Limitations  seated    Bridge  10 reps;5 seconds    Bridge Limitations  bridge with calves on red physioball x 5 reps holding 5 seconds    Other Supine Lumbar Exercises  trunk rotation with legs on red physioball      Modalities   Modalities  Moist Heat      Moist Heat Therapy   Number Minutes Moist Heat  8 Minutes    Moist Heat Location  Lumbar Spine   R glutes     Manual Therapy   Manual Therapy  Soft tissue mobilization    Manual therapy comments  8 minutes    Soft tissue mobilization  IT band and R gluteals, R piriformis                  PT Long Term Goals - 01/11/20 0858      PT LONG TERM GOAL #1   Title  Pt will be I and compliant with HEP. (target date for all goals 6 weeks 02/20/20)    Time  6    Period  Weeks    Status  On-going      PT LONG TERM GOAL #2   Title  Pt will improve Rt leg pain/radiculopathy to overall less than 3/10 with ususal activity.    Status  On-going      PT LONG TERM GOAL #3   Title  Pt will improve  standing/walking tolerance to >20 minutes    Status  On-going      PT LONG TERM GOAL #4   Title  Pt will improve lumbar AROM to WNL without complaints of pain.    Status  On-going            Plan - 01/17/20 0850    Clinical Impression Statement  Pt arriving to therapy reporting 3/ 10 pain in R low back and glutes. Pt tolerating exercises well. IASTM performed to R lumbar paraspinals and R piriformis/glutes. Pt with tightness noted in superior hamstring tendons. Discussed importance of piriformis, hamstring and IT band stretching. Continue skilled PT.    Personal Factors and Comorbidities  Comorbidity 3+    Comorbidities  PMH: Lt THA 2 years ago, Afib, obesity, HTN,    Examination-Activity Limitations  Stairs;Stand;Lift;Squat;Sleep    Stability/Clinical Decision Making  Evolving/Moderate complexity    Rehab Potential  Good    PT Frequency  2x / week    PT Duration  6 weeks    PT Treatment/Interventions  ADLs/Self Care Home Management;Electrical Stimulation;Moist Heat;Traction;Ultrasound;Gait training;Stair training;Therapeutic activities;Therapeutic exercise;Neuromuscular re-education;Balance training;Manual techniques;Manual lymph drainage;Passive range of motion;Dry needling;Joint Manipulations;Spinal Manipulations    PT Next Visit Plan  review and update  HEP PRN, appered to favor extension during eval. Check trunk shift    PT Home Exercise Plan  Access Code:[email or web address], added standing extension and door stretches.    Consulted and Agree with Plan of Care  Patient       Patient will benefit from skilled therapeutic intervention in order to improve the following deficits and impairments:  Abnormal gait, Decreased activity tolerance, Decreased range of motion, Decreased strength, Difficulty walking, Postural dysfunction, Pain, Increased muscle spasms, Increased fascial restricitons  Visit Diagnosis: Pain in right leg  Acute right-sided low back pain with right-sided  sciatica     Problem List Patient Active Problem List  Diagnosis Date Noted  . Paroxysmal atrial fibrillation (HCC)   . Localized swelling of left lower extremity   . Chest pain 03/13/2019  . Lightheadedness   . Essential hypertension   . BMI 45.0-49.9, adult ([place]) 12/28/2015  . Benign hypertension 12/12/2015  . CVID (common variable immunodeficiency) ([place]) 05/07/2015  . Post-nasal discharge 03/30/2013  . Prediabetes 01/20/2013  . S/P splenectomy 01/20/2013  . History of non-Hodgkin's lymphoma 12/21/2012  . Low back pain radiating to right lower extremity 12/10/2012  . Right lumbar radiculitis 12/10/2012    [name], MPT  01/17/2020, 8:57 AM  [facility] Physical Therapy [address] [place], [place], [address] Phone: [phone]   Fax:  [phone]  Name: Monique Clark MRN: [id number] Date of Birth: [birth year]

## 2020-01-19 ENCOUNTER — Telehealth: Payer: Self-pay | Admitting: Physical Therapy

## 2020-01-19 ENCOUNTER — Encounter: Payer: 59 | Admitting: Physical Therapy

## 2020-01-19 MED FILL — ROSUVASTATIN CALCIUM 10 MG: 10 | 90 days supply | Qty: 90 | Fill #1

## 2020-01-19 MED FILL — VITAMIN D3 50,000 UNITS CAP: 1.25 MG | 28 days supply | Qty: 8 | Fill #0

## 2020-01-19 NOTE — Telephone Encounter (Signed)
I called Monique Clark's mobile number about missing her 8:00am PT appointment. There was no answer. I left message about pt's upcoming appointment which is scheduled on 01/24/2020 at [place], MPT 01/19/20 9:50 AM

## 2020-01-24 ENCOUNTER — Other Ambulatory Visit: Payer: Self-pay

## 2020-01-24 ENCOUNTER — Ambulatory Visit (INDEPENDENT_AMBULATORY_CARE_PROVIDER_SITE_OTHER): Payer: 59 | Admitting: Physical Therapy

## 2020-01-24 DIAGNOSIS — M5441 Lumbago with sciatica, right side: Secondary | ICD-10-CM

## 2020-01-24 DIAGNOSIS — M79604 Pain in right leg: Secondary | ICD-10-CM

## 2020-01-24 NOTE — Therapy (Signed)
[facility] Physical Therapy [address] [place], [place], [address] Phone: [phone]   Fax:  [phone]  Physical Therapy Treatment  Patient Details  Name: Monique Clark MRN: [id number] Date of Birth: [birth year] Referring Provider (PT): Hilts, MD   Encounter Date: 01/24/2020  PT End of Session - 01/24/20 0830    Visit Number  4    Number of Visits  12    Date for PT Re-Evaluation  02/20/20    PT Start Time  0802    PT Stop Time  0840    PT Time Calculation (min)  38 min    Activity Tolerance  Patient tolerated treatment well    Behavior During Therapy  [facility] for tasks assessed/performed       Past Medical History:  Diagnosis Date  . Abscess   . Arthritis   . Atrial fibrillation ([place])   . Cardiac arrhythmia due to congenital heart disease   . CVID (common variable immunodeficiency) ([place]) 2015  . Diverticulosis   . Elevated cholesterol   . High blood pressure   . Non Hodgkin's lymphoma ([place])   . Obesity   . Pneumonia   . UTI (urinary tract infection)     Past Surgical History:  Procedure Laterality Date  . ABDOMINAL HYSTERECTOMY    . APPENDECTOMY    . CESAREAN SECTION    . GALLBLADDER SURGERY    . JOINT REPLACEMENT Left    hip  . left shoulder repair    . LUNG REMOVAL, PARTIAL    . myringostomy Bilateral   . SPLENECTOMY, TOTAL    . TONSILLECTOMY AND ADENOIDECTOMY      There were no vitals filed for this visit.  Subjective Assessment - 01/24/20 0819    Subjective  Pt arriving to therapy reporting 3/10 pain in her low back. Pt reporting doing well with her HEP and performing them at least once daily. Pt feels like overall her pain levels have increased at times. We discussed trying traction during today's treatment.    Pertinent History  PMH: Lt THA, Afib, obesity, HTN,    Limitations  Lifting;Standing;Walking    Currently in Pain?  Yes    Pain Score  3     Pain Location  Back    Pain Orientation  Right    Pain Descriptors /  Indicators  Aching    Pain Type  Acute pain    Pain Onset  More than a month ago                       [facility] Adult PT Treatment/Exercise - 01/24/20 0001      Exercises   Exercises  Lumbar      Lumbar Exercises: Aerobic   Nustep  L5 8 minutes      Lumbar Exercises: Standing   Row  AROM;Strengthening;15 reps;Limitations;Other (comment)    Row Limitations  2 sets, pink cords      Modalities   Modalities  Traction      Moist Heat Therapy   Number Minutes Moist Heat  10 Minutes    Moist Heat Location  Lumbar Spine      Traction   Type of Traction  Lumbar    Min (lbs)  100    Max (lbs)  115    Time  17 minutes             PT Education - 01/24/20 1239    Education Details  Discussed PT POC and discussed  mechanics of lumbar traction and benefits.    Person(s) Educated  Patient    Methods  Explanation    Comprehension  Verbalized understanding          PT Long Term Goals - 01/24/20 0835      PT LONG TERM GOAL #1   Title  Pt will be I and compliant with HEP. (target date for all goals 6 weeks 02/20/20)    Time  6    Period  Weeks    Status  On-going      PT LONG TERM GOAL #2   Title  Pt will improve Rt leg pain/radiculopathy to overall less than 3/10 with ususal activity.    Status  On-going      PT LONG TERM GOAL #3   Title  Pt will improve standing/walking tolerance to >20 minutes    Status  On-going      PT LONG TERM GOAL #4   Title  Pt will improve lumbar AROM to WNL without complaints of pain.    Status  On-going            Plan - 01/24/20 0831    Clinical Impression Statement  Pt arriving to therapy reporting 3/10 pain in R side low back. We discussed trying lumbar mechancial traction today during session, Pt tolerating well. Pt agreeded to continue her HEP with focus on stretching.    Personal Factors and Comorbidities  Comorbidity 3+    Comorbidities  PMH: Lt THA 2 years ago, Afib, obesity, HTN,    Examination-Activity  Limitations  Stairs;Stand;Lift;Squat;Sleep    Stability/Clinical Decision Making  Evolving/Moderate complexity    Rehab Potential  Good    PT Frequency  2x / week    PT Duration  6 weeks    PT Treatment/Interventions  ADLs/Self Care Home Management;Electrical Stimulation;Moist Heat;Traction;Ultrasound;Gait training;Stair training;Therapeutic activities;Therapeutic exercise;Neuromuscular re-education;Balance training;Manual techniques;Manual lymph drainage;Passive range of motion;Dry needling;Joint Manipulations;Spinal Manipulations    PT Next Visit Plan  review and update  HEP PRN, appered to favor extension during eval. Check trunk shift    PT Home Exercise Plan  Access Code:[email or web address], added standing extension and door stretches.    Consulted and Agree with Plan of Care  Patient       Patient will benefit from skilled therapeutic intervention in order to improve the following deficits and impairments:  Abnormal gait, Decreased activity tolerance, Decreased range of motion, Decreased strength, Difficulty walking, Postural dysfunction, Pain, Increased muscle spasms, Increased fascial restricitons  Visit Diagnosis: Pain in right leg  Acute right-sided low back pain with right-sided sciatica     Problem List Patient Active Problem List   Diagnosis Date Noted  . Paroxysmal atrial fibrillation (HCC)   . Localized swelling of left lower extremity   . Chest pain 03/13/2019  . Lightheadedness   . Essential hypertension   . BMI 45.0-49.9, adult ([place]) 12/28/2015  . Benign hypertension 12/12/2015  . CVID (common variable immunodeficiency) ([place]) 05/07/2015  . Post-nasal discharge 03/30/2013  . Prediabetes 01/20/2013  . S/P splenectomy 01/20/2013  . History of non-Hodgkin's lymphoma 12/21/2012  . Low back pain radiating to right lower extremity 12/10/2012  . Right lumbar radiculitis 12/10/2012    [name],  MPT 01/24/2020, 12:43 PM  [facility] Physical Therapy [address] [place], [place], [address] Phone: [phone]   Fax:  [phone]  Name: Monique Clark MRN: [id number] Date of Birth: [birth year]

## 2020-01-26 ENCOUNTER — Other Ambulatory Visit: Payer: Self-pay

## 2020-01-26 ENCOUNTER — Encounter: Payer: Self-pay | Admitting: Rehabilitative and Restorative Service Providers"

## 2020-01-26 ENCOUNTER — Ambulatory Visit (INDEPENDENT_AMBULATORY_CARE_PROVIDER_SITE_OTHER): Payer: 59 | Admitting: Rehabilitative and Restorative Service Providers"

## 2020-01-26 DIAGNOSIS — M5441 Lumbago with sciatica, right side: Secondary | ICD-10-CM | POA: Diagnosis not present

## 2020-01-26 DIAGNOSIS — M79604 Pain in right leg: Secondary | ICD-10-CM | POA: Diagnosis not present

## 2020-01-26 NOTE — Patient Instructions (Signed)
Access Code: [email or web address] URL: [email or web address] Date: 01/26/2020 Prepared by: Vista Mink  Exercises Supine Single Knee to Chest Stretch - 2-3 x daily - 7 x weekly - 1 sets - 5 reps - 20 seconds hold Supine Hamstring Stretch - 2-3 x daily - 7 x weekly - 1 sets - 5 reps - 20 seconds hold Supine Figure 4 Piriformis Stretch - 2-3 x daily - 7 x weekly - 1 sets - 5 reps - 20 seconds hold Standing Lumbar Extension - 5 x daily - 7 x weekly - 1 sets - 5 reps - 3 seconds hold Heel Toe Raises with Counter Support - 2-3 x daily - 7 x weekly - 1 sets - 10 reps - 3 seconds hold Prone Alternating Arm and Leg Lifts - 2 x daily - 7 x weekly - 2 sets - 10 reps - 3 seconds hold

## 2020-01-26 NOTE — Therapy (Addendum)
[facility] Physical Therapy [address] [place], [place], [address] Phone: [phone]   Fax:  [phone]  Physical Therapy Treatment/Discharge  Patient Details  Name: Monique Clark MRN: [id number] Date of Birth: Apr 13, 1966 Referring Provider (PT): Hilts, MD   Encounter Date: 01/26/2020  PT End of Session - 01/26/20 0910    Visit Number  5    Number of Visits  12    Date for PT Re-Evaluation  02/20/20    PT Start Time  0801    PT Stop Time  0856    PT Time Calculation (min)  55 min    Activity Tolerance  Patient tolerated treatment well    Behavior During Therapy  [facility] for tasks assessed/performed       Past Medical History:  Diagnosis Date  . Abscess   . Arthritis   . Atrial fibrillation ([place])   . Cardiac arrhythmia due to congenital heart disease   . CVID (common variable immunodeficiency) ([place]) 2015  . Diverticulosis   . Elevated cholesterol   . High blood pressure   . Non Hodgkin's lymphoma ([place])   . Obesity   . Pneumonia   . UTI (urinary tract infection)     Past Surgical History:  Procedure Laterality Date  . ABDOMINAL HYSTERECTOMY    . APPENDECTOMY    . CESAREAN SECTION    . GALLBLADDER SURGERY    . JOINT REPLACEMENT Left    hip  . left shoulder repair    . LUNG REMOVAL, PARTIAL    . myringostomy Bilateral   . SPLENECTOMY, TOTAL    . TONSILLECTOMY AND ADENOIDECTOMY      There were no vitals filed for this visit.  Subjective Assessment - 01/26/20 0801    Subjective  Monique Clark seems discouraged with her early PT results.  I reminded her this is a 6-8 week process.  Reinforced the importance of consistent PT attendance and HEP compliance.    Pertinent History  PMH: Lt THA, Afib, obesity, HTN,    Limitations  Lifting;Standing;Walking    Pain Score  4     Pain Location  Back    Pain Onset  More than a month ago                       [facility] Adult PT Treatment/Exercise - 01/26/20 0001      Posture/Postural  Control   Posture/Postural Control  Postural limitations    Postural Limitations  Forward head;Rounded Shoulders;Decreased lumbar lordosis   B knee hyperextension   Posture Comments  Spent quite a bit of time reviewing spine anatomy, process of disc herniation, proper log and lumbar roll use, walking program benefits and body mechanics.      Exercises   Exercises  Lumbar;Knee/Hip      Lumbar Exercises: Stretches   Active Hamstring Stretch  4 reps;20 seconds    Single Knee to Chest Stretch  4 reps;20 seconds    Hip Flexor Stretch  4 reps;20 seconds    Piriformis Stretch  4 reps;20 seconds   Supine   Other Lumbar Stretch Exercise  --   Standing trunk extension (hips forward) 3X 5  3 seconds     Lumbar Exercises: Machines for Strengthening   Leg Press  3 sets of 10 75#      Lumbar Exercises: Standing   Heel Raises  10 reps;3 seconds   2 sets heel to toe raises with lumbar stabilization/ball     Lumbar Exercises: Prone  Other Prone Lumbar Exercises  --   Prone opposite leg extensions with ankle DF 2X 10  3 seconds            PT Education - 01/26/20 0909    Education Details  Spent lots of time on anatomy, log and lumbar roll, walking program and postural strategies to handle work and commute time.    Person(s) Educated  Patient    Methods  Explanation;Demonstration;Handout    Comprehension  Verbalized understanding;Returned demonstration;Need further instruction          PT Long Term Goals - 01/24/20 0835      PT LONG TERM GOAL #1   Title  Pt will be I and compliant with HEP. (target date for all goals 6 weeks 02/20/20)    Time  6    Period  Weeks    Status  On-going      PT LONG TERM GOAL #2   Title  Pt will improve Rt leg pain/radiculopathy to overall less than 3/10 with ususal activity.    Status  On-going      PT LONG TERM GOAL #3   Title  Pt will improve standing/walking tolerance to >20 minutes    Status  On-going      PT LONG TERM GOAL #4   Title   Pt will improve lumbar AROM to WNL without complaints of pain.    Status  On-going            Plan - 01/26/20 0910    Clinical Impression Statement  Monique Clark is frustrated with her early PT results.  She reports more soreness, although not significantly more painful.  I reminded her that this will be a 6-8 week process.  Her 1 hour car commute to and from work is a challenge and we discussed the importance of using a lumbar roll when seated.  We reviewed her home walking program and body mechanics.  Core strengthening was progressed and we discussed activities she can do in her pool when she opens it up next week.    Personal Factors and Comorbidities  Comorbidity 3+    Comorbidities  PMH: Lt THA 2 years ago, Afib, obesity, HTN,    Examination-Activity Limitations  Stairs;Stand;Lift;Squat;Sleep    Stability/Clinical Decision Making  Evolving/Moderate complexity    Rehab Potential  Good    PT Frequency  2x / week    PT Duration  6 weeks    PT Treatment/Interventions  ADLs/Self Care Home Management;Electrical Stimulation;Moist Heat;Traction;Ultrasound;Gait training;Stair training;Therapeutic activities;Therapeutic exercise;Neuromuscular re-education;Balance training;Manual techniques;Manual lymph drainage;Passive range of motion;Dry needling;Joint Manipulations;Spinal Manipulations    PT Next Visit Plan  Assess progress with strength progressions.  Continue strength and body mechanics work.    PT Home Exercise Plan  Access Code: [email or web address]    Consulted and Agree with Plan of Care  Patient       Patient will benefit from skilled therapeutic intervention in order to improve the following deficits and impairments:  Abnormal gait, Decreased activity tolerance, Decreased range of motion, Decreased strength, Difficulty walking, Postural dysfunction, Pain, Increased muscle spasms, Increased fascial restricitons  Visit Diagnosis: Pain in right leg  Acute right-sided low back pain with right-sided  sciatica     Problem List Patient Active Problem List   Diagnosis Date Noted  . Paroxysmal atrial fibrillation (HCC)   . Localized swelling of left lower extremity   . Chest pain 03/13/2019  . Lightheadedness   . Essential hypertension   . BMI 45.0-49.9, adult ([place])  12/28/2015  . Benign hypertension 12/12/2015  . CVID (common variable immunodeficiency) ([place]) 05/07/2015  . Post-nasal discharge 03/30/2013  . Prediabetes 01/20/2013  . S/P splenectomy 01/20/2013  . History of non-Hodgkin's lymphoma 12/21/2012  . Low back pain radiating to right lower extremity 12/10/2012  . Right lumbar radiculitis 12/10/2012    Farley Ly PT, MPT 01/26/2020, 9:14 AM   PHYSICAL THERAPY DISCHARGE SUMMARY  Visits from Start of Care: 5  Current functional level related to goals / functional outcomes: Unknown   Remaining deficits: See evaluation/last note   Education / Equipment: HEP  Plan: Patient agrees to discharge.  Patient goals were not met. Patient is being discharged due to the patient's request.  ?????    Farley Ly PT, MPT [facility] Physical Therapy [address] [place], [place], [address] Phone: [phone]   Fax:  [phone]  Name: Monique Clark MRN: [id number] Date of Birth: 05-25-1966

## 2020-01-30 ENCOUNTER — Other Ambulatory Visit: Payer: Self-pay

## 2020-01-30 ENCOUNTER — Ambulatory Visit: Payer: 59 | Admitting: Family Medicine

## 2020-01-30 ENCOUNTER — Encounter: Payer: Self-pay | Admitting: Family Medicine

## 2020-01-30 ENCOUNTER — Ambulatory Visit (INDEPENDENT_AMBULATORY_CARE_PROVIDER_SITE_OTHER): Payer: 59

## 2020-01-30 DIAGNOSIS — M5441 Lumbago with sciatica, right side: Secondary | ICD-10-CM

## 2020-01-30 DIAGNOSIS — D801 Nonfamilial hypogammaglobulinemia: Secondary | ICD-10-CM | POA: Diagnosis not present

## 2020-01-30 MED ORDER — TRAMADOL HCL 50 MG PO TABS: 50.0000 mg | ORAL_TABLET | Freq: Every evening | ORAL | 0 refills | Status: DC | PRN

## 2020-01-30 MED FILL — tiZANidine HCL 4 MG TABS: 4 | 30 days supply | Qty: 30 | Fill #3

## 2020-01-30 MED FILL — traMADol HCL 50 MG TABS: 50 | 10 days supply | Qty: 20 | Fill #0

## 2020-01-30 MED FILL — DULoxetine HCL 30 MG CPEP: 30 | 30 days supply | Qty: 60 | Fill #1

## 2020-01-30 MED FILL — PRAMIPEXOLE 0.25 MG TABLET: 0.25 | 30 days supply | Qty: 30 | Fill #3

## 2020-01-30 MED FILL — GABAPENTIN 100 MG CAPSULE: 100 | 30 days supply | Qty: 90 | Fill #1

## 2020-01-30 MED FILL — MYRBETRIQ ER 50 MG TABLET: 50 | 30 days supply | Qty: 30 | Fill #2

## 2020-01-30 NOTE — Progress Notes (Signed)
Office Visit Note   Patient: Monique Clark           Date of Birth: 1966/08/12           MRN: [id number] Visit Date: 01/30/2020 Requested by: Lonia Mad, MD No address on file PCP: Lonia Mad, MD  Subjective: Chief Complaint  Patient presents with  . Lower Back - Pain    Worsening right lower back/sciatica. Has been going to PT x a couple weeks and doing home exercises. Not able to sleep due to pain.    HPI: She is here with worsening low back pain with right-sided sciatica.  Unfortunately physical therapy has not provided much relief.  Pain from the posterior hip down the back of the leg causing tingling in all of her toes.  No incontinence, no weakness.  Sometimes she gets temporary relief when she sits upright, but the pain is already much constant.  No fevers or chills.              ROS:   All other systems were reviewed and are negative.  Objective: Vital Signs: There were no vitals taken for this visit.  Physical Exam:  General:  Alert and oriented, in no acute distress. Pulm:  Breathing unlabored. Psy:  Normal mood, congruent affect. Low back: No significant spine tenderness.  She is tender in the right sciatic notch.  No pain with passive internal hip rotation and her range of motion is good.  Straight leg raise negative, lower extremity strength and reflexes are still normal and equal.  Imaging: XR Lumbar Spine 2-3 Views  Result Date: 01/30/2020 X-rays lumbar spine: She has diffuse degenerative disc disease, more pronounced at L3-4 and L5-S1.  This results in narrowing of the foramen.  She has facet DJD and right-sided SI joint DJD.  Left hip prosthesis looks good, the right hip has moderate joint space narrowing.   Assessment & Plan: 1.  Persistent right-sided sciatica, concerning for disc protrusion with foraminal stenosis. -We will proceed with MRI scan.  She would also like to pursue epidural steroid injection.  Gabapentin at night as needed, tramadol as  needed.    Procedures: No procedures performed  No notes on file     PMFS History: Patient Active Problem List   Diagnosis Date Noted  . Paroxysmal atrial fibrillation (HCC)   . Localized swelling of left lower extremity   . Chest pain 03/13/2019  . Lightheadedness   . Essential hypertension   . BMI 45.0-49.9, adult ([place]) 12/28/2015  . Benign hypertension 12/12/2015  . CVID (common variable immunodeficiency) ([place]) 05/07/2015  . Post-nasal discharge 03/30/2013  . Prediabetes 01/20/2013  . S/P splenectomy 01/20/2013  . History of non-Hodgkin's lymphoma 12/21/2012  . Low back pain radiating to right lower extremity 12/10/2012  . Right lumbar radiculitis 12/10/2012   Past Medical History:  Diagnosis Date  . Abscess   . Arthritis   . Atrial fibrillation ([place])   . Cardiac arrhythmia due to congenital heart disease   . 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 Diverticulosis   . Elevated cholesterol   . High blood pressure   . Non Hodgkin's lymphoma ([place])   . Obesity   . Pneumonia   . UTI (urinary tract infection)     History reviewed. No pertinent family history.  Past Surgical History:  Procedure Laterality Date  . ABDOMINAL HYSTERECTOMY    . APPENDECTOMY    . CESAREAN SECTION    . GALLBLADDER SURGERY    .  JOINT REPLACEMENT Left    hip  . left shoulder repair    . LUNG REMOVAL, PARTIAL    . myringostomy Bilateral   . SPLENECTOMY, TOTAL    . TONSILLECTOMY AND ADENOIDECTOMY     Social History   Occupational History  . Occupation: rn  Tobacco Use  . Smoking status: Former Smoker    Packs/day: 0.50    Types: Cigarettes    Quit date: 09/29/2002    Years since quitting: 17.3  . Smokeless tobacco: Never Used  Substance and Sexual Activity  . Alcohol use: No  . Drug use: No  . Sexual activity: Not on file

## 2020-01-31 ENCOUNTER — Encounter: Payer: 59 | Admitting: Rehabilitative and Restorative Service Providers"

## 2020-02-02 ENCOUNTER — Encounter: Payer: 59 | Admitting: Physical Therapy

## 2020-02-07 ENCOUNTER — Encounter: Payer: 59 | Admitting: Physical Therapy

## 2020-02-09 ENCOUNTER — Ambulatory Visit: Payer: 59 | Admitting: Physician Assistant

## 2020-02-09 ENCOUNTER — Encounter: Payer: Self-pay | Admitting: Physician Assistant

## 2020-02-09 ENCOUNTER — Other Ambulatory Visit: Payer: Self-pay

## 2020-02-09 ENCOUNTER — Encounter: Payer: 59 | Admitting: Physical Therapy

## 2020-02-09 DIAGNOSIS — R21 Rash and other nonspecific skin eruption: Secondary | ICD-10-CM | POA: Diagnosis not present

## 2020-02-09 DIAGNOSIS — Z1283 Encounter for screening for malignant neoplasm of skin: Secondary | ICD-10-CM

## 2020-02-09 MED ORDER — MUPIROCIN 2 % EX OINT: 1.0000 "application " | TOPICAL_OINTMENT | Freq: Two times a day (BID) | CUTANEOUS | 0 refills | Status: DC

## 2020-02-09 NOTE — Progress Notes (Signed)
   New Patient   Subjective  Monique Clark is a 54 y.o. female who presents for the following: Skin Problem (red bump: ? bite - all over body not a specific area: itchy sometimes - tx neosporin). She does picks sometimes.   The following portions of the chart were reviewed this encounter and updated as appropriate: Tobacco  Allergies  Meds  Problems  Med Hx  Surg Hx  Fam Hx      Objective  Well appearing patient in no apparent distress; mood and affect are within normal limits.  A full examination was performed including scalp, head, eyes, ears, nose, lips, neck, chest, axillae, abdomen, back, buttocks, bilateral upper extremities, bilateral lower extremities, hands, feet, fingers, toes, fingernails, and toenails. All findings within normal limits unless otherwise noted below.  Objective  Left Breast, Left Flank, Right Breast, Right Hip (side) - Posterior: Buttock- pink plaque with history of rubbing, additional areas open pink patches  Objective  Head to Toe: Waist up no DN or signs of NMSC   Assessment & Plan  Rash and other nonspecific skin eruption (4) Right Hip (side) - Posterior; Left Breast; Right Breast; Left Flank  Bacterial culture  Ordered Medications: mupirocin ointment (BACTROBAN) 2 %  Screening exam for skin cancer Head to Toe  Yearly exam

## 2020-02-12 ENCOUNTER — Ambulatory Visit
Admission: RE | Admit: 2020-02-12 | Discharge: 2020-02-12 | Disposition: A | Discharge status: Home / Self Care | Payer: 59 | Source: Ambulatory Visit | Attending: Family Medicine | Admitting: Family Medicine

## 2020-02-12 DIAGNOSIS — M5441 Lumbago with sciatica, right side: Secondary | ICD-10-CM

## 2020-02-12 DIAGNOSIS — M5126 Other intervertebral disc displacement, lumbar region: Secondary | ICD-10-CM | POA: Diagnosis not present

## 2020-02-12 DIAGNOSIS — M5136 Other intervertebral disc degeneration, lumbar region: Secondary | ICD-10-CM | POA: Diagnosis not present

## 2020-02-12 DIAGNOSIS — M48061 Spinal stenosis, lumbar region without neurogenic claudication: Secondary | ICD-10-CM | POA: Diagnosis not present

## 2020-02-13 ENCOUNTER — Telehealth: Payer: Self-pay | Admitting: Family Medicine

## 2020-02-13 NOTE — Telephone Encounter (Signed)
MRI shows bulging discs at several levels.  This could affect the nerve openings on both sides of the spine.  The L5-S1 level is probably the most likely culprit for your right-sided symptoms.  Hopefully the epidural injection will help.

## 2020-02-15 ENCOUNTER — Telehealth: Payer: Self-pay | Admitting: Physician Assistant

## 2020-02-15 ENCOUNTER — Encounter: Payer: Self-pay | Admitting: Family Medicine

## 2020-02-15 LAB — ANAEROBIC AND AEROBIC CULTURE
AER RESULT:: NO GROWTH
GRAM STAIN:: NONE SEEN
MICRO NUMBER:: 10474120
MICRO NUMBER:: 10474121
SPECIMEN QUALITY:: ADEQUATE
SPECIMEN QUALITY:: ADEQUATE

## 2020-02-15 MED FILL — TOPIRAMATE 100 MG TABLET: 100 | 90 days supply | Qty: 270 | Fill #1

## 2020-02-15 MED FILL — BYSTOLIC 2.5 MG TABLET: 2.5 | 90 days supply | Qty: 90 | Fill #3

## 2020-02-15 MED FILL — VITAMIN D3 50,000 UNITS CAP: 1.25 MG | 28 days supply | Qty: 8 | Fill #1

## 2020-02-15 NOTE — Telephone Encounter (Signed)
Please review patient's culture results and give [place] recommendations.

## 2020-02-15 NOTE — Telephone Encounter (Signed)
Patient is calling to get culture results and get information as to next step. If calling on Wednesday, 02/15/2020 call home number and 02/16/2020 to 02/20/2020 call her cell phone.  (Epic)

## 2020-02-16 MED ORDER — TRAMADOL HCL 50 MG PO TABS: 50.0000 mg | ORAL_TABLET | Freq: Every evening | ORAL | 0 refills | Status: DC | PRN

## 2020-02-16 MED FILL — traMADol HCL 50 MG TABS: 50 | 10 days supply | Qty: 20 | Fill #0

## 2020-02-16 MED FILL — PHENTERMINE 37.5 MG CAPSULE: 37.5 | 30 days supply | Qty: 30 | Fill #0

## 2020-02-17 NOTE — Telephone Encounter (Signed)
RX: TAC 454 bid 2 rf

## 2020-02-20 ENCOUNTER — Telehealth: Payer: Self-pay

## 2020-02-20 MED ORDER — TRIAMCINOLONE ACETONIDE 0.1 % EX CREA
1.0000 | TOPICAL_CREAM | Freq: Two times a day (BID) | CUTANEOUS | 1 refills | Status: DC | PRN
Start: 2020-02-20 — End: 2020-05-08

## 2020-02-20 MED FILL — TRIAMCINOLONE ACETONIDE 0.1: 0.1 | 30 days supply | Qty: 454 | Fill #0

## 2020-02-20 NOTE — Telephone Encounter (Signed)
Message left for patient results are negative culture on my chart. Also per [facility] message triamcinolone cream 0.1% to be called in apply to affected area qd-bid not for face or skin folds

## 2020-02-20 NOTE — Telephone Encounter (Signed)
-----   Message from Warren Danes, [place] sent at 02/17/2020  8:54 AM EDT ----- Rx clobetasol or fluocinonide cream or ointment. BID 2 rf

## 2020-02-20 NOTE — Telephone Encounter (Signed)
Phone call to patient with her lab results. Voicemail left for patient to give the office a call back.  

## 2020-02-22 ENCOUNTER — Ambulatory Visit: Payer: 59 | Admitting: Physical Medicine and Rehabilitation

## 2020-02-22 ENCOUNTER — Encounter: Payer: Self-pay | Admitting: Physical Medicine and Rehabilitation

## 2020-02-22 ENCOUNTER — Other Ambulatory Visit: Payer: Self-pay

## 2020-02-22 ENCOUNTER — Ambulatory Visit: Payer: Self-pay

## 2020-02-22 VITALS — BP 123/75 | HR 58

## 2020-02-22 DIAGNOSIS — M5416 Radiculopathy, lumbar region: Secondary | ICD-10-CM | POA: Diagnosis not present

## 2020-02-22 MED ORDER — METHYLPREDNISOLONE ACETATE 80 MG/ML IJ SUSP
40.0000 mg | Freq: Once | INTRAMUSCULAR | Status: AC
  Administered 2020-02-22: 40 mg

## 2020-02-22 NOTE — Progress Notes (Signed)
Right buttock pain radiating down posterior right thigh and posterior and lateral lower leg. Worse with walking. Numbness and tingling. Numeric Pain Rating Scale and Functional Assessment Average Pain 5   In the last MONTH (on 0-10 scale) has pain interfered with the following?  1. General activity like being  able to carry out your everyday physical activities such as walking, climbing stairs, carrying groceries, or moving a chair?  Rating(8)   +Driver, -BT, -Dye Allergies.

## 2020-02-23 DIAGNOSIS — Z79899 Other long term (current) drug therapy: Secondary | ICD-10-CM | POA: Diagnosis not present

## 2020-02-23 DIAGNOSIS — Z760 Encounter for issue of repeat prescription: Secondary | ICD-10-CM | POA: Diagnosis not present

## 2020-02-23 DIAGNOSIS — Z5181 Encounter for therapeutic drug level monitoring: Secondary | ICD-10-CM | POA: Diagnosis not present

## 2020-02-24 ENCOUNTER — Other Ambulatory Visit: Payer: Self-pay

## 2020-02-24 NOTE — Telephone Encounter (Signed)
Patient aware next step if topical dont clear = biopsy. She did receive all our messages about lab report.

## 2020-02-28 DIAGNOSIS — Z760 Encounter for issue of repeat prescription: Secondary | ICD-10-CM | POA: Diagnosis not present

## 2020-02-28 NOTE — Telephone Encounter (Signed)
-----   Message from Warren Danes, [place] sent at 02/17/2020  8:54 AM EDT ----- Rx clobetasol or fluocinonide cream or ointment. BID 2 rf

## 2020-02-28 NOTE — Progress Notes (Signed)
Lizania Eickman - 54 y.o. female MRN [id number]  Date of birth: 12/29/1965  Office Visit Note: Visit Date: 02/22/2020 PCP: Lonia Mad, MD Referred by: Lonia Mad, MD  Subjective: Chief Complaint  Patient presents with  . Lower Back - Pain   HPI:  Brianne Sobie is a 54 y.o. female who comes in today At the request of Dr. Junius Roads for planned Right L5-S1 lumbar epidural steroid injection with fluoroscopic guidance.  The patient has failed conservative care including home exercise, medications, time and activity modification.  This injection will be diagnostic and hopefully therapeutic.  Please see requesting physician notes for further details and justification.  MRI reviewed with images and spine model.  MRI reviewed in the note below.  Consideration would be given to facet joint block with potential for facet joint syndrome with more of a radicular type pain Duda facet arthritis which is really what her spine MRI shows more but she does have broad disc bulge bulging along with the arthritis at L5-S1 which could affect the S1 nerve roots.  No specific nerve compression noted.  ROS Otherwise per HPI.  Assessment & Plan: Visit Diagnoses:  1. Lumbar radiculopathy     Plan: No additional findings.   Meds & Orders:  Meds ordered this encounter  Medications  . methylPREDNISolone acetate (DEPO-MEDROL) injection 40 mg    Orders Placed This Encounter  Procedures  . XR C-ARM NO REPORT  . Epidural Steroid injection    Follow-up: Return if symptoms worsen or fail to improve.   Procedures: No procedures performed  Lumbar Epidural Steroid Injection - Interlaminar Approach with Fluoroscopic Guidance  Patient: Sherine Orellana      Date of Birth: 03/08/1966 MRN: [id number] PCP: Lonia Mad, MD      Visit Date: 02/22/2020   Universal Protocol:     Consent Given By: the patient  Position: PRONE  Additional Comments: Vital signs were monitored before and after the  procedure. Patient was prepped and draped in the usual sterile fashion. The correct patient, procedure, and site was verified.   Injection Procedure Details:  Procedure Site One Meds Administered:  Meds ordered this encounter  Medications  . methylPREDNISolone acetate (DEPO-MEDROL) injection 40 mg     Laterality: Right  Location/Site:  L5-S1  Needle size: 20 G  Needle type: Tuohy  Needle Placement: Paramedian epidural  Findings:   -Comments: Excellent flow of contrast into the epidural space.  Procedure Details: Using a paramedian approach from the side mentioned above, the region overlying the inferior lamina was localized under fluoroscopic visualization and the soft tissues overlying this structure were infiltrated with 4 ml. of 1% Lidocaine without Epinephrine. The Tuohy needle was inserted into the epidural space using a paramedian approach.   The epidural space was localized using loss of resistance along with lateral and bi-planar fluoroscopic views.  After negative aspirate for air, blood, and CSF, a 2 ml. volume of Isovue-250 was injected into the epidural space and the flow of contrast was observed. Radiographs were obtained for documentation purposes.    The injectate was administered into the level noted above.   Additional Comments:  The patient tolerated the procedure well Dressing: 2 x 2 sterile gauze and Band-Aid    Post-procedure details: Patient was observed during the procedure. Post-procedure instructions were reviewed.  Patient left the clinic in stable condition.     Clinical History: MRI LUMBAR SPINE WITHOUT CONTRAST  TECHNIQUE: Multiplanar, multisequence MR imaging of the lumbar spine was performed. No intravenous  contrast was administered.  COMPARISON:  Radiography 01/30/2020  FINDINGS: Segmentation:  5 lumbar type vertebral bodies.  Alignment:  1 mm degenerative anterolisthesis L4-5.  Vertebrae: No fracture or primary bone  lesion of significance. Few scattered benign appearing hemangiomas.  Conus medullaris and cauda equina: Conus extends to the L1-2 level. Conus and cauda equina appear normal.  Paraspinal and other soft tissues: Negative  Disc levels:  No significant disc or disc space finding at L1-2 or above.  L2-3: Mild desiccation and bulging of the disc. Mild bilateral facet osteoarthritis. No compressive stenosis.  L3-4: Moderate bulging of the disc. Mild facet and ligamentous hypertrophy. Narrowing of the lateral recesses left more than right. Mild facet edema which could be associated with back pain or referred facet syndrome pain.  L4-5: Moderate bulging of the disc. Bilateral facet osteoarthritis allowing 1 mm of anterolisthesis. Mild stenosis of both lateral recesses. Mild facet edema which could be associated with back pain or referred facet syndrome pain.  L5-S1: Shallow disc protrusion in the left posterolateral direction. Facet and ligamentous hypertrophy. Narrowing of both subarticular lateral recesses left more than right. Some potential to affect either S1 nerve, particularly the left. Mild facet edema could be associated with back pain or referred facet syndrome pain.  IMPRESSION: Degenerative disc disease and degenerative facet disease in the lumbar spine from L2-3 through L5-S1. Throughout the region, the facet arthropathy could be associated with back pain or referred facet syndrome pain, joints showing most pronounced involvement at L3-4 and L4-5. Disc bulges throughout the region contribute to lateral recess stenosis that could potentially be symptomatic, particularly at the L4-5 and L5-S1 levels.   Electronically Signed   By: Nelson Chimes M.D.   On: 02/13/2020 08:01     Objective:  VS:  HT:    WT:   BMI:     BP:123/75  HR:(!) 58bpm  TEMP: ( )  RESP:  Physical Exam   Imaging: No results found.

## 2020-02-28 NOTE — Procedures (Signed)
Lumbar Epidural Steroid Injection - Interlaminar Approach with Fluoroscopic Guidance  Patient: Monique Clark      Date of Birth: 11-25-1965 MRN: [id number] PCP: Lonia Mad, MD      Visit Date: 02/22/2020   Universal Protocol:     Consent Given By: the patient  Position: PRONE  Additional Comments: Vital signs were monitored before and after the procedure. Patient was prepped and draped in the usual sterile fashion. The correct patient, procedure, and site was verified.   Injection Procedure Details:  Procedure Site One Meds Administered:  Meds ordered this encounter  Medications  . methylPREDNISolone acetate (DEPO-MEDROL) injection 40 mg     Laterality: Right  Location/Site:  L5-S1  Needle size: 20 G  Needle type: Tuohy  Needle Placement: Paramedian epidural  Findings:   -Comments: Excellent flow of contrast into the epidural space.  Procedure Details: Using a paramedian approach from the side mentioned above, the region overlying the inferior lamina was localized under fluoroscopic visualization and the soft tissues overlying this structure were infiltrated with 4 ml. of 1% Lidocaine without Epinephrine. The Tuohy needle was inserted into the epidural space using a paramedian approach.   The epidural space was localized using loss of resistance along with lateral and bi-planar fluoroscopic views.  After negative aspirate for air, blood, and CSF, a 2 ml. volume of Isovue-250 was injected into the epidural space and the flow of contrast was observed. Radiographs were obtained for documentation purposes.    The injectate was administered into the level noted above.   Additional Comments:  The patient tolerated the procedure well Dressing: 2 x 2 sterile gauze and Band-Aid    Post-procedure details: Patient was observed during the procedure. Post-procedure instructions were reviewed.  Patient left the clinic in stable condition.

## 2020-02-28 NOTE — Telephone Encounter (Signed)
MyChart message sent to patient regarding results.

## 2020-02-29 MED FILL — DULoxetine HCL 30 MG CPEP: 30 | 30 days supply | Qty: 60 | Fill #2

## 2020-02-29 MED FILL — MYRBETRIQ ER 50 MG TABLET: 50 | 30 days supply | Qty: 30 | Fill #3

## 2020-02-29 MED FILL — tiZANidine HCL 4 MG TABS: 4 | 30 days supply | Qty: 30 | Fill #4

## 2020-02-29 MED FILL — GABAPENTIN 100 MG CAPSULE: 100 | 30 days supply | Qty: 90 | Fill #2

## 2020-03-03 ENCOUNTER — Encounter: Payer: Self-pay | Admitting: Physical Medicine and Rehabilitation

## 2020-03-08 DIAGNOSIS — G90A Postural orthostatic tachycardia syndrome (POTS): Secondary | ICD-10-CM | POA: Insufficient documentation

## 2020-03-12 DIAGNOSIS — D801 Nonfamilial hypogammaglobulinemia: Secondary | ICD-10-CM | POA: Diagnosis not present

## 2020-03-12 DIAGNOSIS — Z888 Allergy status to other drugs, medicaments and biological substances status: Secondary | ICD-10-CM | POA: Diagnosis not present

## 2020-03-12 DIAGNOSIS — Z887 Allergy status to serum and vaccine status: Secondary | ICD-10-CM | POA: Diagnosis not present

## 2020-03-12 DIAGNOSIS — I10 Essential (primary) hypertension: Secondary | ICD-10-CM | POA: Diagnosis not present

## 2020-03-12 DIAGNOSIS — Z7982 Long term (current) use of aspirin: Secondary | ICD-10-CM | POA: Diagnosis not present

## 2020-03-12 DIAGNOSIS — R0602 Shortness of breath: Secondary | ICD-10-CM | POA: Diagnosis not present

## 2020-03-12 DIAGNOSIS — Z96642 Presence of left artificial hip joint: Secondary | ICD-10-CM | POA: Diagnosis not present

## 2020-03-12 DIAGNOSIS — Z79899 Other long term (current) drug therapy: Secondary | ICD-10-CM | POA: Diagnosis not present

## 2020-03-12 DIAGNOSIS — R079 Chest pain, unspecified: Secondary | ICD-10-CM | POA: Diagnosis not present

## 2020-03-18 ENCOUNTER — Emergency Department (HOSPITAL_COMMUNITY): Payer: 59

## 2020-03-18 ENCOUNTER — Other Ambulatory Visit: Payer: Self-pay

## 2020-03-18 ENCOUNTER — Emergency Department (HOSPITAL_COMMUNITY)
Admission: EM | Admit: 2020-03-18 | Discharge: 2020-03-18 | Disposition: A | Discharge status: Home / Self Care | Payer: 59 | Attending: Emergency Medicine | Admitting: Emergency Medicine

## 2020-03-18 ENCOUNTER — Encounter (HOSPITAL_COMMUNITY): Payer: Self-pay

## 2020-03-18 DIAGNOSIS — R42 Dizziness and giddiness: Secondary | ICD-10-CM | POA: Diagnosis not present

## 2020-03-18 DIAGNOSIS — R05 Cough: Secondary | ICD-10-CM | POA: Insufficient documentation

## 2020-03-18 DIAGNOSIS — Z87891 Personal history of nicotine dependence: Secondary | ICD-10-CM | POA: Diagnosis not present

## 2020-03-18 DIAGNOSIS — I517 Cardiomegaly: Secondary | ICD-10-CM | POA: Diagnosis not present

## 2020-03-18 DIAGNOSIS — Z8572 Personal history of non-Hodgkin lymphomas: Secondary | ICD-10-CM | POA: Insufficient documentation

## 2020-03-18 DIAGNOSIS — I10 Essential (primary) hypertension: Secondary | ICD-10-CM | POA: Insufficient documentation

## 2020-03-18 DIAGNOSIS — R21 Rash and other nonspecific skin eruption: Secondary | ICD-10-CM | POA: Diagnosis not present

## 2020-03-18 DIAGNOSIS — Z79899 Other long term (current) drug therapy: Secondary | ICD-10-CM | POA: Insufficient documentation

## 2020-03-18 DIAGNOSIS — R079 Chest pain, unspecified: Secondary | ICD-10-CM | POA: Insufficient documentation

## 2020-03-18 DIAGNOSIS — R0789 Other chest pain: Secondary | ICD-10-CM | POA: Diagnosis not present

## 2020-03-18 DIAGNOSIS — R0602 Shortness of breath: Secondary | ICD-10-CM | POA: Insufficient documentation

## 2020-03-18 DIAGNOSIS — Z96642 Presence of left artificial hip joint: Secondary | ICD-10-CM | POA: Diagnosis not present

## 2020-03-18 LAB — BASIC METABOLIC PANEL
Anion gap: 9 (ref 5–15)
BUN: 19 mg/dL (ref 6–20)
CO2: 25 mmol/L (ref 22–32)
Calcium: 9.7 mg/dL (ref 8.9–10.3)
Chloride: 107 mmol/L (ref 98–111)
Creatinine, Ser: 0.94 mg/dL (ref 0.44–1.00)
GFR calc Af Amer: >60 mL/min (ref >60)
GFR calc non Af Amer: >60 mL/min (ref >60)
Glucose, Bld: 94 mg/dL (ref 70–99)
Potassium: 4.2 mmol/L (ref 3.5–5.1)
Sodium: 141 mmol/L (ref 135–145)

## 2020-03-18 LAB — CBC
HCT: 40.5 % (ref 36.0–46.0)
Hemoglobin: 12.3 g/dL (ref 12.0–15.0)
MCH: 27.3 pg (ref 26.0–34.0)
MCHC: 30.4 g/dL (ref 30.0–36.0)
MCV: 89.8 fL (ref 80.0–100.0)
Platelets: 383 10*3/uL (ref 150–400)
RBC: 4.51 MIL/uL (ref 3.87–5.11)
RDW: 16 % — ABNORMAL HIGH (ref 11.5–15.5)
WBC: 12.1 10*3/uL — ABNORMAL HIGH (ref 4.0–10.5)
nRBC: 0 % (ref 0.0–0.2)

## 2020-03-18 LAB — TROPONIN I (HIGH SENSITIVITY)
Troponin I (High Sensitivity): 7 ng/L (ref <18)
Troponin I (High Sensitivity): 7 ng/L (ref <18)

## 2020-03-18 LAB — I-STAT BETA HCG BLOOD, ED (MC, WL, AP ONLY): I-stat hCG, quantitative: 15.3 m[IU]/mL — ABNORMAL HIGH (ref <5)

## 2020-03-18 LAB — MAGNESIUM: Magnesium: 1.8 mg/dL (ref 1.7–2.4)

## 2020-03-18 MED ORDER — IOHEXOL 350 MG/ML SOLN
100.0000 mL | Freq: Once | INTRAVENOUS | Status: AC | PRN
  Administered 2020-03-18: 100 mL via INTRAVENOUS

## 2020-03-18 MED ORDER — FENTANYL CITRATE (PF) 100 MCG/2ML IJ SOLN
50.0000 ug | Freq: Once | INTRAMUSCULAR | Status: AC
  Administered 2020-03-18: 50 ug via INTRAVENOUS
  Filled 2020-03-18: qty 2

## 2020-03-18 MED ORDER — SODIUM CHLORIDE 0.9% FLUSH: 3.0000 mL | Freq: Once | INTRAVENOUS | Status: DC

## 2020-03-18 MED ORDER — ONDANSETRON HCL 4 MG/2ML IJ SOLN
4.0000 mg | Freq: Once | INTRAMUSCULAR | Status: AC
  Administered 2020-03-18: 4 mg via INTRAVENOUS
  Filled 2020-03-18: qty 2

## 2020-03-18 NOTE — ED Notes (Signed)
Discharge instructions discussed with pt. Pt verbalized understanding with no further questions a this time. Pt to go home with spouse

## 2020-03-18 NOTE — Discharge Instructions (Addendum)
You were seen in the emergency department for evaluation of chest pain.  You had a chest x-ray blood work EKG and a CAT scan of your chest that did not show any serious findings.  Specifically there was no evidence of any heart attack or pulmonary embolism.  Please continue your regular medications and follow-up with your doctor for continued work-up of your symptoms.  Return to the emergency department for any worsening or concerning symptoms.

## 2020-03-18 NOTE — ED Provider Notes (Signed)
[facility] Provider Note   CSN: [id number] Arrival date & time: 03/18/20  1711     History Chief Complaint  Patient presents with  . Chest Pain    Monique Clark is a 54 y.o. female.  She has a remote history of non-Hodgkin's lymphoma and has continued common variable immunodeficiency.  She is complaining of 2 weeks of some right-sided rib pain that is now radiating into her left chest area.  Associated with some shortness of breath.  Dry cough.  Lightheadedness.  She saw her oncologist in [place] and had a chest CTA done 6 days ago that was unremarkable.  Has continued ibuprofen without any improvement.  No nausea vomiting diarrhea.  The history is provided by the patient.  Chest Pain Pain location:  L chest and R chest Pain quality: pressure   Pain severity:  Moderate Onset quality:  Gradual Duration:  2 weeks Timing:  Constant Progression:  Worsening Chronicity:  New Context: breathing and movement   Relieved by:  Nothing Worsened by:  Deep breathing and movement Ineffective treatments: ibuprofen. Associated symptoms: cough, dizziness and shortness of breath   Associated symptoms: no abdominal pain, no altered mental status, no diaphoresis, no fever, no nausea and no vomiting   Risk factors: high cholesterol and hypertension        Past Medical History:  Diagnosis Date  . Abscess   . Arthritis   . Atrial fibrillation ([place])   . Cardiac arrhythmia due to congenital heart disease   . CVID (common variable immunodeficiency) ([place]) 2015  . Diverticulosis   . Elevated cholesterol   . High blood pressure   . Non Hodgkin's lymphoma ([place])   . Obesity   . Pneumonia   . UTI (urinary tract infection)     Patient Active Problem List   Diagnosis Date Noted  . Paroxysmal atrial fibrillation (HCC)   . Localized swelling of left lower extremity   . Chest pain 03/13/2019  . Lightheadedness   . Essential hypertension   . BMI  45.0-49.9, adult ([place]) 12/28/2015  . Benign hypertension 12/12/2015  . CVID (common variable immunodeficiency) ([place]) 05/07/2015  . Post-nasal discharge 03/30/2013  . Prediabetes 01/20/2013  . S/P splenectomy 01/20/2013  . History of non-Hodgkin's lymphoma 12/21/2012  . Low back pain radiating to right lower extremity 12/10/2012  . Right lumbar radiculitis 12/10/2012    Past Surgical History:  Procedure Laterality Date  . ABDOMINAL HYSTERECTOMY    . APPENDECTOMY    . CESAREAN SECTION    . GALLBLADDER SURGERY    . JOINT REPLACEMENT Left    hip  . left shoulder repair    . LUNG REMOVAL, PARTIAL    . myringostomy Bilateral   . SPLENECTOMY, TOTAL    . TONSILLECTOMY AND ADENOIDECTOMY       OB History   No obstetric history on file.     No family history on file.  Social History   Tobacco Use  . Smoking status: Former Smoker    Packs/day: 0.50    Types: Cigarettes    Quit date: 09/29/2002    Years since quitting: 17.4  . Smokeless tobacco: Never Used  Vaping Use  . Vaping Use: Never used  Substance Use Topics  . Alcohol use: No  . Drug use: No    Home Medications Prior to Admission medications   Medication Sig Start Date End Date Taking? Authorizing Provider  acetaminophen (TYLENOL) 650 MG CR tablet Take 650 mg by mouth  every 8 (eight) hours as needed for pain.    [provider]  aspirin 325 MG tablet Take 325 mg by mouth daily.  07/25/14   [provider]  co-enzyme Q-10 30 MG capsule Take 30 mg by mouth 3 (three) times daily.    [provider]  DULoxetine (CYMBALTA) 30 MG capsule Take 30 mg by mouth daily at 12 noon. 08/14/17   [provider]  famotidine (PEPCID) 20 MG tablet Take 20 mg by mouth daily.  07/25/14   [provider]  gabapentin (NEURONTIN) 100 MG capsule 1 PO q HS, may increase to 1 PO TID if needed 01/04/20   Hilts, Jupiter Kabir, MD  ibuprofen (ADVIL) 800 MG tablet Take 800 mg by mouth every 8 (eight) hours as  needed. 12/08/19   [provider]  Immune Globulin, Human, (HIZENTRA) 4 GM/20ML SOLN Inject 18 g into the skin once a week. Monday 05/03/15   [provider]  mupirocin ointment (BACTROBAN) 2 % Apply 1 application topically 2 (two) times daily. 02/09/20   Sheffield, Kelli R, PA-C  MYRBETRIQ 50 MG TB24 tablet Take 50 mg by mouth daily. 12/30/19   [provider]  nebivolol (BYSTOLIC) 2.5 MG tablet Take 1 tablet (2.5 mg total) by mouth 2 (two) times a day. 03/16/19   Mullis, Kiersten P, DO  phentermine 37.5 MG capsule Take 37.5 mg by mouth daily. 11/15/19   [provider]  pramipexole (MIRAPEX) 0.25 MG tablet Take 0.25 mg by mouth at bedtime.    [provider]  predniSONE (DELTASONE) 5 MG tablet Take 5 mg by mouth daily. 11/20/19   [provider]  rosuvastatin (CRESTOR) 10 MG tablet Take 1 tablet (10 mg total) by mouth daily at 6 PM. 03/16/19   Mullis, Kiersten P, DO  tiZANidine (ZANAFLEX) 4 MG capsule Take 4 mg by mouth at bedtime.  03/04/17   [provider]  topiramate (TOPAMAX) 100 MG tablet  11/10/19   [provider]  traMADol (ULTRAM) 50 MG tablet Take 1-2 tablets (50-100 mg total) by mouth at bedtime as needed. 02/16/20   Hilts, Legrand Como, MD  triamcinolone cream (KENALOG) 0.1 % Apply 1 application topically 2 (two) times daily as needed. 02/20/20   Sheffield, Ronalee Red, PA-C  Vitamin D, Ergocalciferol, (DRISDOL) 50000 units CAPS capsule Take 50,000 Units by mouth See admin instructions. Bi weekly Tuesday and Thursday 01/29/17   [provider]    Allergies    Nizatidine, Promethazine, and Tetanus toxoids  Review of Systems   Review of Systems  Constitutional: Negative for diaphoresis and fever.  HENT: Negative for sore throat.   Eyes: Negative for visual disturbance.  Respiratory: Positive for cough and shortness of breath.   Cardiovascular: Positive for chest pain.  Gastrointestinal: Negative for abdominal pain, nausea  and vomiting.  Genitourinary: Negative for dysuria.  Musculoskeletal: Negative for neck pain.  Skin: Negative for rash.  Neurological: Positive for dizziness.    Physical Exam Updated Vital Signs BP (!) 167/93 (BP Location: Right Arm)   Pulse 61   Temp 97.7 F (36.5 C) (Oral)   Resp 16   Ht 5' 4.5" (1.638 m)   Wt 127.9 kg   SpO2 100%   BMI 47.66 kg/m   Physical Exam Vitals and nursing note reviewed.  Constitutional:      General: She is not in acute distress.    Appearance: She is well-developed.  HENT:     Head: Normocephalic and atraumatic.  Eyes:  Conjunctiva/sclera: Conjunctivae normal.  Cardiovascular:     Rate and Rhythm: Normal rate and regular rhythm.     Heart sounds: Normal heart sounds. No murmur heard.   Pulmonary:     Effort: Pulmonary effort is normal. No respiratory distress.     Breath sounds: Normal breath sounds.  Abdominal:     Palpations: Abdomen is soft. There is no mass.     Tenderness: There is no abdominal tenderness.  Musculoskeletal:        General: Normal range of motion.     Cervical back: Neck supple.     Right lower leg: No tenderness.     Left lower leg: No tenderness.  Skin:    General: Skin is warm and dry.     Capillary Refill: Capillary refill takes less than 2 seconds.  Neurological:     General: No focal deficit present.     Mental Status: She is alert.     ED Results / Procedures / Treatments   Labs (all labs ordered are listed, but only abnormal results are displayed) Labs Reviewed  CBC - Abnormal; Notable for the following components:      Result Value   WBC 12.1 (*)    RDW 16.0 (*)    All other components within normal limits  I-STAT BETA HCG BLOOD, ED ([facility], [facility], [facility] ONLY) - Abnormal; Notable for the following components:   I-stat hCG, quantitative 15.3 (*)    All other components within normal limits  BASIC METABOLIC PANEL  MAGNESIUM  TROPONIN I (HIGH SENSITIVITY)  TROPONIN I (HIGH SENSITIVITY)     EKG EKG Interpretation  Date/Time:  Sunday March 18 2020 17:13:32 EDT Ventricular Rate:  61 PR Interval:  158 QRS Duration: 92 QT Interval:  436 QTC Calculation: 438 R Axis:   16 Text Interpretation: Normal sinus rhythm Low voltage QRS Cannot rule out Anterior infarct , age undetermined Abnormal ECG new low voltage from prior 6/20 Confirmed by Aletta Edouard [phone]) on 03/18/2020 6:51:19 PM   Radiology DG Chest 2 View  Result Date: 03/18/2020 CLINICAL DATA:  54 year old female with chest pain. EXAM: CHEST - 2 VIEW COMPARISON:  Chest radiograph dated 03/13/2019. FINDINGS: The lungs are clear. There is no pleural effusion pneumothorax. Mild cardiomegaly. Degenerative changes of the spine. No acute osseous pathology. IMPRESSION: No active cardiopulmonary disease. Electronically Signed   By: Anner Crete M.D.   On: 03/18/2020 18:30    Procedures Procedures (including critical care time)  Medications Ordered in ED Medications  ondansetron (ZOFRAN) injection 4 mg (4 mg Intravenous Given 03/18/20 1954)  fentaNYL (SUBLIMAZE) injection 50 mcg (50 mcg Intravenous Given 03/18/20 1953)  iohexol (OMNIPAQUE) 350 MG/ML injection 100 mL (100 mLs Intravenous Contrast Given 03/18/20 2002)    ED Course  I have reviewed the triage vital signs and the nursing notes.  Pertinent labs & imaging results that were available during my care of the patient were reviewed by me and considered in my medical decision making (see chart for details).  Clinical Course as of Mar 19 1104  Sun Mar 18, 2020  2144 Patient CT interpreted by me as no gross PE, no acute findings.  Reviewed with patient.  Recommended she contact her primary care doctor and oncologist for continued management.   [MB]    Clinical Course User Index [MB] Hayden Rasmussen, MD   MDM Rules/Calculators/A&P  This patient complains of chest pain shortness of breath lightheadedness; this involves an extensive  number of treatment Options and is a complaint that carries with it a high risk of complications and Morbidity. The differential includes ACS, vascular musculoskeletal, PE, pneumothorax, anemia, metabolic derangement  I ordered, reviewed and interpreted labs, which included CBC with slightly elevated white count, normal hemoglobin, normal chemistries, stable delta troponin.  I-STAT beta-hCG slightly elevated, reviewed with patient she has no concerns that she is pregnant and has had a hysterectomy, likely lab error I ordered medication fentanyl and Zofran with improvement in her pain I ordered imaging studies which included chest x-ray and CT angio chest and I independently    visualized and interpreted imaging which showed no acute findings no PE Previous records obtained and reviewed in epic-unable to see [facility] records or CT report  After the interventions stated above, I reevaluated the patient and found the patient to be hemodynamically stable and reasonably comfortable.  She is frustrated that we have not come up with an answer for her symptoms.  I reinforced that she needs to follow-up with her doctors for further management and work-up of her complaints.  She is comfortable with plan.   Final Clinical Impression(s) / ED Diagnoses Final diagnoses:  Nonspecific chest pain    Rx / DC Orders ED Discharge Orders    None       Hayden Rasmussen, MD 03/19/20 1108

## 2020-03-18 NOTE — ED Notes (Signed)
Pt transported to CT ?

## 2020-03-18 NOTE — ED Triage Notes (Signed)
Pt reports Right side rib pain x2 weeks, radiating around her back toy her Left chest area x2 days. pt seen at her oncologist Monday, had a CTA and labs drawn. Pt to follow back up after PET scan

## 2020-03-21 DIAGNOSIS — Z8572 Personal history of non-Hodgkin lymphomas: Secondary | ICD-10-CM | POA: Diagnosis not present

## 2020-03-21 DIAGNOSIS — D801 Nonfamilial hypogammaglobulinemia: Secondary | ICD-10-CM | POA: Diagnosis not present

## 2020-03-21 DIAGNOSIS — Z9081 Acquired absence of spleen: Secondary | ICD-10-CM | POA: Diagnosis not present

## 2020-03-21 MED FILL — VITAMIN D3 50,000 UNITS CAP: 1.25 MG | 28 days supply | Qty: 8 | Fill #2

## 2020-03-24 MED FILL — tiZANidine HCL 4 MG TABS: 4 | 30 days supply | Qty: 30 | Fill #5

## 2020-03-24 MED FILL — MYRBETRIQ ER 50 MG TABLET: 50 | 30 days supply | Qty: 30 | Fill #4

## 2020-03-24 MED FILL — GABAPENTIN 100 MG CAPSULE: 100 | 30 days supply | Qty: 90 | Fill #3

## 2020-03-24 MED FILL — DULoxetine HCL 30 MG CPEP: 30 | 30 days supply | Qty: 60 | Fill #3

## 2020-03-26 DIAGNOSIS — R0602 Shortness of breath: Secondary | ICD-10-CM | POA: Diagnosis not present

## 2020-03-26 DIAGNOSIS — D801 Nonfamilial hypogammaglobulinemia: Secondary | ICD-10-CM | POA: Diagnosis not present

## 2020-03-26 DIAGNOSIS — I10 Essential (primary) hypertension: Secondary | ICD-10-CM | POA: Diagnosis not present

## 2020-03-26 DIAGNOSIS — R079 Chest pain, unspecified: Secondary | ICD-10-CM | POA: Diagnosis not present

## 2020-03-27 ENCOUNTER — Encounter: Payer: Self-pay | Admitting: Physical Medicine and Rehabilitation

## 2020-03-27 ENCOUNTER — Ambulatory Visit: Payer: Self-pay

## 2020-03-27 ENCOUNTER — Other Ambulatory Visit: Payer: Self-pay

## 2020-03-27 ENCOUNTER — Ambulatory Visit: Payer: 59 | Admitting: Physical Medicine and Rehabilitation

## 2020-03-27 VITALS — BP 129/78 | HR 64

## 2020-03-27 DIAGNOSIS — M5416 Radiculopathy, lumbar region: Secondary | ICD-10-CM

## 2020-03-27 MED ORDER — METHYLPREDNISOLONE ACETATE 80 MG/ML IJ SUSP
40.0000 mg | Freq: Once | INTRAMUSCULAR | Status: AC
Start: 2020-03-27 — End: 2020-03-27
  Administered 2020-03-27: 40 mg

## 2020-03-27 NOTE — Progress Notes (Signed)
Pt states pain in the right buttocks that radiates into the right leg all the way down with numbness in the right foot. Pt states walking makes pain worse. Resting helps with pain. Pt states pain started months ago.   .Numeric Pain Rating Scale and Functional Assessment Average Pain 4   In the last MONTH (on 0-10 scale) has pain interfered with the following?  1. General activity like being  able to carry out your everyday physical activities such as walking, climbing stairs, carrying groceries, or moving a chair?  Rating(6)   +Driver, -BT, -Dye Allergies.

## 2020-03-27 NOTE — Procedures (Signed)
Lumbar Epidural Steroid Injection - Interlaminar Approach with Fluoroscopic Guidance  Patient: Monique Clark      Date of Birth: 1966-05-04 MRN: [id number] PCP: Lonia Mad, MD      Visit Date: 03/27/2020   Universal Protocol:     Consent Given By: the patient  Position: PRONE  Additional Comments: Vital signs were monitored before and after the procedure. Patient was prepped and draped in the usual sterile fashion. The correct patient, procedure, and site was verified.   Injection Procedure Details:  Procedure Site One Meds Administered:  Meds ordered this encounter  Medications  . methylPREDNISolone acetate (DEPO-MEDROL) injection 40 mg     Laterality: Right  Location/Site:  L5-S1  Needle size: 20 G  Needle type: Tuohy  Needle Placement: Paramedian epidural  Findings:   -Comments: Excellent flow of contrast into the epidural space.  Procedure Details: Using a paramedian approach from the side mentioned above, the region overlying the inferior lamina was localized under fluoroscopic visualization and the soft tissues overlying this structure were infiltrated with 4 ml. of 1% Lidocaine without Epinephrine. The Tuohy needle was inserted into the epidural space using a paramedian approach.   The epidural space was localized using loss of resistance along with lateral and bi-planar fluoroscopic views.  After negative aspirate for air, blood, and CSF, a 2 ml. volume of Isovue-250 was injected into the epidural space and the flow of contrast was observed. Radiographs were obtained for documentation purposes.    The injectate was administered into the level noted above.   Additional Comments:  The patient tolerated the procedure well Dressing: 2 x 2 sterile gauze and Band-Aid    Post-procedure details: Patient was observed during the procedure. Post-procedure instructions were reviewed.  Patient left the clinic in stable condition.

## 2020-03-27 NOTE — Progress Notes (Signed)
Monique Clark - 54 y.o. female MRN [id number]  Date of birth: 1966-06-26  Office Visit Note: Visit Date: 03/27/2020 PCP: Lonia Mad, MD Referred by: Lonia Mad, MD  Subjective: Chief Complaint  Patient presents with  . Right Leg - Pain  . Right Foot - Numbness   HPI:  Monique Clark is a 54 y.o. female who comes in today for planned repeat Right L5-S1 Lumbar epidural steroid injection with fluoroscopic guidance.  The patient has failed conservative care including home exercise, medications, time and activity modification.  This injection will be diagnostic and hopefully therapeutic.  Please see requesting physician notes for further details and justification. Patient received more than 50% pain relief from prior injection.   Referring: Dr. [name] Blase   She reports yeast infection on back after injections with use of chloroprep. She can use OTC treatment or I will prescribe nystatin. This may allergy to chlorhexidine. I will use alcohol today for prep.  ROS Otherwise per HPI.  Assessment & Plan: Visit Diagnoses:  1. Lumbar radiculopathy     Plan: No additional findings.   Meds & Orders:  Meds ordered this encounter  Medications  . methylPREDNISolone acetate (DEPO-MEDROL) injection 40 mg    Orders Placed This Encounter  Procedures  . XR C-ARM NO REPORT  . Epidural Steroid injection    Follow-up: Return for visit to requesting physician as needed.   Procedures: No procedures performed  Lumbar Epidural Steroid Injection - Interlaminar Approach with Fluoroscopic Guidance  Patient: Monique Clark      Date of Birth: September 17, 1966 MRN: [id number] PCP: Lonia Mad, MD      Visit Date: 03/27/2020   Universal Protocol:     Consent Given By: the patient  Position: PRONE  Additional Comments: Vital signs were monitored before and after the procedure. Patient was prepped and draped in the usual sterile fashion. The correct patient, procedure, and site  was verified.   Injection Procedure Details:  Procedure Site One Meds Administered:  Meds ordered this encounter  Medications  . methylPREDNISolone acetate (DEPO-MEDROL) injection 40 mg     Laterality: Right  Location/Site:  L5-S1  Needle size: 20 G  Needle type: Tuohy  Needle Placement: Paramedian epidural  Findings:   -Comments: Excellent flow of contrast into the epidural space.  Procedure Details: Using a paramedian approach from the side mentioned above, the region overlying the inferior lamina was localized under fluoroscopic visualization and the soft tissues overlying this structure were infiltrated with 4 ml. of 1% Lidocaine without Epinephrine. The Tuohy needle was inserted into the epidural space using a paramedian approach.   The epidural space was localized using loss of resistance along with lateral and bi-planar fluoroscopic views.  After negative aspirate for air, blood, and CSF, a 2 ml. volume of Isovue-250 was injected into the epidural space and the flow of contrast was observed. Radiographs were obtained for documentation purposes.    The injectate was administered into the level noted above.   Additional Comments:  The patient tolerated the procedure well Dressing: 2 x 2 sterile gauze and Band-Aid    Post-procedure details: Patient was observed during the procedure. Post-procedure instructions were reviewed.  Patient left the clinic in stable condition.    Clinical History: MRI LUMBAR SPINE WITHOUT CONTRAST  TECHNIQUE: Multiplanar, multisequence MR imaging of the lumbar spine was performed. No intravenous contrast was administered.  COMPARISON:  Radiography 01/30/2020  FINDINGS: Segmentation:  5 lumbar type vertebral bodies.  Alignment:  1 mm degenerative anterolisthesis  L4-5.  Vertebrae: No fracture or primary bone lesion of significance. Few scattered benign appearing hemangiomas.  Conus medullaris and cauda equina: Conus  extends to the L1-2 level. Conus and cauda equina appear normal.  Paraspinal and other soft tissues: Negative  Disc levels:  No significant disc or disc space finding at L1-2 or above.  L2-3: Mild desiccation and bulging of the disc. Mild bilateral facet osteoarthritis. No compressive stenosis.  L3-4: Moderate bulging of the disc. Mild facet and ligamentous hypertrophy. Narrowing of the lateral recesses left more than right. Mild facet edema which could be associated with back pain or referred facet syndrome pain.  L4-5: Moderate bulging of the disc. Bilateral facet osteoarthritis allowing 1 mm of anterolisthesis. Mild stenosis of both lateral recesses. Mild facet edema which could be associated with back pain or referred facet syndrome pain.  L5-S1: Shallow disc protrusion in the left posterolateral direction. Facet and ligamentous hypertrophy. Narrowing of both subarticular lateral recesses left more than right. Some potential to affect either S1 nerve, particularly the left. Mild facet edema could be associated with back pain or referred facet syndrome pain.  IMPRESSION: Degenerative disc disease and degenerative facet disease in the lumbar spine from L2-3 through L5-S1. Throughout the region, the facet arthropathy could be associated with back pain or referred facet syndrome pain, joints showing most pronounced involvement at L3-4 and L4-5. Disc bulges throughout the region contribute to lateral recess stenosis that could potentially be symptomatic, particularly at the L4-5 and L5-S1 levels.   Electronically Signed   By: Nelson Chimes M.D.   On: 02/13/2020 08:01     Objective:  VS:  HT:    WT:   BMI:     BP:129/78  HR:64bpm  TEMP: ( )  RESP:  Physical Exam Constitutional:      General: She is not in acute distress.    Appearance: Normal appearance. She is obese. She is not ill-appearing.  HENT:     Head: Normocephalic and atraumatic.     Right  Ear: External ear normal.     Left Ear: External ear normal.  Eyes:     Extraocular Movements: Extraocular movements intact.  Cardiovascular:     Rate and Rhythm: Normal rate.     Pulses: Normal pulses.  Musculoskeletal:     Right lower leg: No edema.     Left lower leg: No edema.     Comments: Patient has good distal strength with no pain over the greater trochanters.  No clonus or focal weakness.  Skin:    Findings: No erythema, lesion or rash.  Neurological:     General: No focal deficit present.     Mental Status: She is alert and oriented to person, place, and time.     Sensory: No sensory deficit.     Motor: No weakness or abnormal muscle tone.     Coordination: Coordination normal.  Psychiatric:        Mood and Affect: Mood normal.        Behavior: Behavior normal.      Imaging: XR C-ARM NO REPORT  Result Date: 03/27/2020 Please see Notes tab for imaging impression.

## 2020-03-29 DIAGNOSIS — D801 Nonfamilial hypogammaglobulinemia: Secondary | ICD-10-CM | POA: Diagnosis not present

## 2020-04-10 DIAGNOSIS — Z3202 Encounter for pregnancy test, result negative: Secondary | ICD-10-CM | POA: Diagnosis not present

## 2020-04-10 DIAGNOSIS — Z3201 Encounter for pregnancy test, result positive: Secondary | ICD-10-CM | POA: Diagnosis not present

## 2020-04-11 DIAGNOSIS — Z3201 Encounter for pregnancy test, result positive: Secondary | ICD-10-CM | POA: Diagnosis not present

## 2020-04-21 MED FILL — DULoxetine HCL 30 MG CPEP: 30 | 30 days supply | Qty: 60 | Fill #4

## 2020-04-21 MED FILL — VITAMIN D3 50,000 UNITS CAP: 1.25 MG | 28 days supply | Qty: 8 | Fill #3

## 2020-04-21 MED FILL — MYRBETRIQ ER 50 MG TABLET: 50 | 30 days supply | Qty: 30 | Fill #5

## 2020-04-23 ENCOUNTER — Other Ambulatory Visit (HOSPITAL_COMMUNITY): Payer: Self-pay | Admitting: Internal Medicine

## 2020-04-23 DIAGNOSIS — Z3201 Encounter for pregnancy test, result positive: Secondary | ICD-10-CM | POA: Diagnosis not present

## 2020-04-24 MED FILL — tiZANidine HCL 4 MG TABS: 4 | 30 days supply | Qty: 30 | Fill #0

## 2020-04-25 ENCOUNTER — Encounter: Payer: Self-pay | Admitting: Physical Medicine and Rehabilitation

## 2020-04-26 ENCOUNTER — Other Ambulatory Visit: Payer: Self-pay | Admitting: Physical Medicine and Rehabilitation

## 2020-04-26 MED ORDER — TRAMADOL HCL 50 MG PO TABS: 50.0000 mg | ORAL_TABLET | Freq: Every evening | ORAL | 0 refills | Status: DC | PRN

## 2020-04-26 MED FILL — traMADol HCL 50 MG TABS: 50 | 10 days supply | Qty: 20 | Fill #0

## 2020-04-26 NOTE — Progress Notes (Signed)
Patient's 12 month [place] CSRS database history was reviewed and no inappropriate medication refills noted.   

## 2020-05-07 ENCOUNTER — Other Ambulatory Visit: Payer: Self-pay

## 2020-05-07 ENCOUNTER — Ambulatory Visit: Payer: 59 | Admitting: Physical Medicine and Rehabilitation

## 2020-05-07 ENCOUNTER — Ambulatory Visit: Payer: Self-pay

## 2020-05-07 ENCOUNTER — Encounter: Payer: Self-pay | Admitting: Family Medicine

## 2020-05-07 ENCOUNTER — Encounter: Payer: Self-pay | Admitting: Physical Medicine and Rehabilitation

## 2020-05-07 VITALS — BP 122/76 | HR 59

## 2020-05-07 DIAGNOSIS — M25551 Pain in right hip: Secondary | ICD-10-CM

## 2020-05-07 DIAGNOSIS — M5416 Radiculopathy, lumbar region: Secondary | ICD-10-CM

## 2020-05-07 DIAGNOSIS — M5116 Intervertebral disc disorders with radiculopathy, lumbar region: Secondary | ICD-10-CM | POA: Diagnosis not present

## 2020-05-07 MED ORDER — METHYLPREDNISOLONE ACETATE 80 MG/ML IJ SUSP
40.0000 mg | Freq: Once | INTRAMUSCULAR | Status: AC
  Administered 2020-05-07: 40 mg

## 2020-05-07 NOTE — Progress Notes (Signed)
Pt states lower back pain that travels to her posterior right leg to her calf. Pt states walking and do activties makes the worse. Pt states pain meds helps with the pain. Pt has hx of inj on 03/27/20 pt states the last inj didn't do that good but , the first one helped better than the last.  Numeric Pain Rating Scale and Functional Assessment Average Pain 4   In the last MONTH (on 0-10 scale) has pain interfered with the following?  1. General activity like being  able to carry out your everyday physical activities such as walking, climbing stairs, carrying groceries, or moving a chair?  Rating(7)   +Driver, -BT, -Dye Allergies.

## 2020-05-07 NOTE — Progress Notes (Signed)
Monique Clark - 54 y.o. female MRN [id number]  Date of birth: Mar 26, 1966  Office Visit Note: Visit Date: 05/07/2020 PCP: Lonia Mad, MD Referred by: Lonia Mad, MD  Subjective: Chief Complaint  Patient presents with  . Right Leg - Pain, Numbness  . Lower Back - Pain   HPI:  Monique Clark is a 54 y.o. female who comes in today at the request of Dr. [name] Blase for planned Right S1-2 Lumbar epidural steroid injection with fluoroscopic guidance.  The patient has failed conservative care including home exercise, medications, time and activity modification.  This injection will be diagnostic and hopefully therapeutic.  Please see requesting physician notes for further details and justification.  Patient originally seen several months ago with right posterior buttock and thigh pain.  MRI from Dr. Junius Roads after failing conservative care showed pretty decent facet arthritis at L4-5 with small listhesis and lateral recess narrowing and then also disc protrusion more left than right but broad-based and lateral recess narrowing L5-S1.  Interlaminar injection was beneficial initially.  We repeated the injection recently from an interlaminar approach and she just did not get as much relief.  Fluoroscopic imaging was reviewed and it shows like a well-placed injection.  We will complete transforaminal approach today to get more medication along the S1 nerve root in the lateral recess and see if that helps.  Some of her back pain obviously could be facet mediated low back pain but her biggest complaint is this right buttock and hamstring pain.  ROS Otherwise per HPI.  Assessment & Plan: Visit Diagnoses:  1. Lumbar radiculopathy   2. Radiculopathy due to lumbar intervertebral disc disorder     Plan: No additional findings.   Meds & Orders:  Meds ordered this encounter  Medications  . methylPREDNISolone acetate (DEPO-MEDROL) injection 40 mg    Orders Placed This Encounter  Procedures   . XR C-ARM NO REPORT  . Epidural Steroid injection    Follow-up: Return in about 2 weeks (around 05/21/2020) for [name] Blase, MD.   Procedures: No procedures performed  S1 Lumbosacral Transforaminal Epidural Steroid Injection - Sub-Pedicular Approach with Fluoroscopic Guidance   Patient: Monique Clark      Date of Birth: July 22, 1966 MRN: [id number] PCP: Lonia Mad, MD      Visit Date: 05/07/2020   Universal Protocol:    Date/Time: 08/10/216:29 AM  Consent Given By: the patient  Position:  PRONE  Additional Comments: Vital signs were monitored before and after the procedure. Patient was prepped and draped in the usual sterile fashion. The correct patient, procedure, and site was verified.   Injection Procedure Details:  Procedure Site One Meds Administered:  Meds ordered this encounter  Medications  . methylPREDNISolone acetate (DEPO-MEDROL) injection 40 mg    Laterality: Right  Location/Site:  S1 Foramen   Needle size: 22 ga.  Needle type: Spinal  Needle Placement: Transforaminal  Findings:   -Comments: Excellent flow of contrast along the nerve and into the epidural space.  Epidurogram: Contrast epidurogram showed no nerve root cut off or restricted flow pattern.  Procedure Details: After squaring off the sacral end-plate to get a true AP view, the C-arm was positioned so that the best possible view of the S1 foramen was visualized. The soft tissues overlying this structure were infiltrated with 2-3 ml. of 1% Lidocaine without Epinephrine.    The spinal needle was inserted toward the target using a "trajectory" view along the fluoroscope beam.  Under AP and lateral visualization, the needle  was advanced so it did not puncture dura. Biplanar projections were used to confirm position. Aspiration was confirmed to be negative for CSF and/or blood. A 1-2 ml. volume of Isovue-250 was injected and flow of contrast was noted at each level. Radiographs were  obtained for documentation purposes.   After attaining the desired flow of contrast documented above, a 0.5 to 1.0 ml test dose of 0.25% Marcaine was injected into each respective transforaminal space.  The patient was observed for 90 seconds post injection.  After no sensory deficits were reported, and normal lower extremity motor function was noted,   the above injectate was administered so that equal amounts of the injectate were placed at each foramen (level) into the transforaminal epidural space.   Additional Comments:  The patient tolerated the procedure well Dressing: Band-Aid with 2 x 2 sterile gauze    Post-procedure details: Patient was observed during the procedure. Post-procedure instructions were reviewed.  Patient left the clinic in stable condition.     Clinical History: MRI LUMBAR SPINE WITHOUT CONTRAST  TECHNIQUE: Multiplanar, multisequence MR imaging of the lumbar spine was performed. No intravenous contrast was administered.  COMPARISON:  Radiography 01/30/2020  FINDINGS: Segmentation:  5 lumbar type vertebral bodies.  Alignment:  1 mm degenerative anterolisthesis L4-5.  Vertebrae: No fracture or primary bone lesion of significance. Few scattered benign appearing hemangiomas.  Conus medullaris and cauda equina: Conus extends to the L1-2 level. Conus and cauda equina appear normal.  Paraspinal and other soft tissues: Negative  Disc levels:  No significant disc or disc space finding at L1-2 or above.  L2-3: Mild desiccation and bulging of the disc. Mild bilateral facet osteoarthritis. No compressive stenosis.  L3-4: Moderate bulging of the disc. Mild facet and ligamentous hypertrophy. Narrowing of the lateral recesses left more than right. Mild facet edema which could be associated with back pain or referred facet syndrome pain.  L4-5: Moderate bulging of the disc. Bilateral facet osteoarthritis allowing 1 mm of anterolisthesis. Mild  stenosis of both lateral recesses. Mild facet edema which could be associated with back pain or referred facet syndrome pain.  L5-S1: Shallow disc protrusion in the left posterolateral direction. Facet and ligamentous hypertrophy. Narrowing of both subarticular lateral recesses left more than right. Some potential to affect either S1 nerve, particularly the left. Mild facet edema could be associated with back pain or referred facet syndrome pain.  IMPRESSION: Degenerative disc disease and degenerative facet disease in the lumbar spine from L2-3 through L5-S1. Throughout the region, the facet arthropathy could be associated with back pain or referred facet syndrome pain, joints showing most pronounced involvement at L3-4 and L4-5. Disc bulges throughout the region contribute to lateral recess stenosis that could potentially be symptomatic, particularly at the L4-5 and L5-S1 levels.   Electronically Signed   By: Nelson Chimes M.D.   On: 02/13/2020 08:01     Objective:  VS:  HT:    WT:   BMI:     BP:122/76  HR:(!) 59bpm  TEMP: ( )  RESP:  Physical Exam   Imaging: XR C-ARM NO REPORT  Result Date: 05/07/2020 Please see Notes tab for imaging impression.

## 2020-05-08 NOTE — Procedures (Signed)
S1 Lumbosacral Transforaminal Epidural Steroid Injection - Sub-Pedicular Approach with Fluoroscopic Guidance   Patient: Monique Clark      Date of Birth: January 17, 1966 MRN: [id number] PCP: Lonia Mad, MD      Visit Date: 05/07/2020   Universal Protocol:    Date/Time: 08/10/216:29 AM  Consent Given By: the patient  Position:  PRONE  Additional Comments: Vital signs were monitored before and after the procedure. Patient was prepped and draped in the usual sterile fashion. The correct patient, procedure, and site was verified.   Injection Procedure Details:  Procedure Site One Meds Administered:  Meds ordered this encounter  Medications  . methylPREDNISolone acetate (DEPO-MEDROL) injection 40 mg    Laterality: Right  Location/Site:  S1 Foramen   Needle size: 22 ga.  Needle type: Spinal  Needle Placement: Transforaminal  Findings:   -Comments: Excellent flow of contrast along the nerve and into the epidural space.  Epidurogram: Contrast epidurogram showed no nerve root cut off or restricted flow pattern.  Procedure Details: After squaring off the sacral end-plate to get a true AP view, the C-arm was positioned so that the best possible view of the S1 foramen was visualized. The soft tissues overlying this structure were infiltrated with 2-3 ml. of 1% Lidocaine without Epinephrine.    The spinal needle was inserted toward the target using a "trajectory" view along the fluoroscope beam.  Under AP and lateral visualization, the needle was advanced so it did not puncture dura. Biplanar projections were used to confirm position. Aspiration was confirmed to be negative for CSF and/or blood. A 1-2 ml. volume of Isovue-250 was injected and flow of contrast was noted at each level. Radiographs were obtained for documentation purposes.   After attaining the desired flow of contrast documented above, a 0.5 to 1.0 ml test dose of 0.25% Marcaine was injected into each respective  transforaminal space.  The patient was observed for 90 seconds post injection.  After no sensory deficits were reported, and normal lower extremity motor function was noted,   the above injectate was administered so that equal amounts of the injectate were placed at each foramen (level) into the transforaminal epidural space.   Additional Comments:  The patient tolerated the procedure well Dressing: Band-Aid with 2 x 2 sterile gauze    Post-procedure details: Patient was observed during the procedure. Post-procedure instructions were reviewed.  Patient left the clinic in stable condition.

## 2020-05-10 DIAGNOSIS — C8307 Small cell B-cell lymphoma, spleen: Secondary | ICD-10-CM | POA: Diagnosis not present

## 2020-05-10 DIAGNOSIS — N3281 Overactive bladder: Secondary | ICD-10-CM | POA: Diagnosis not present

## 2020-05-10 DIAGNOSIS — H6590 Unspecified nonsuppurative otitis media, unspecified ear: Secondary | ICD-10-CM | POA: Diagnosis not present

## 2020-05-10 DIAGNOSIS — M353 Polymyalgia rheumatica: Secondary | ICD-10-CM | POA: Diagnosis not present

## 2020-05-10 DIAGNOSIS — M8700 Idiopathic aseptic necrosis of unspecified bone: Secondary | ICD-10-CM | POA: Diagnosis not present

## 2020-05-10 DIAGNOSIS — M791 Myalgia, unspecified site: Secondary | ICD-10-CM | POA: Diagnosis not present

## 2020-05-10 DIAGNOSIS — I480 Paroxysmal atrial fibrillation: Secondary | ICD-10-CM | POA: Diagnosis not present

## 2020-05-10 DIAGNOSIS — Z3201 Encounter for pregnancy test, result positive: Secondary | ICD-10-CM | POA: Diagnosis not present

## 2020-05-10 DIAGNOSIS — R002 Palpitations: Secondary | ICD-10-CM | POA: Diagnosis not present

## 2020-05-13 DIAGNOSIS — D801 Nonfamilial hypogammaglobulinemia: Secondary | ICD-10-CM | POA: Diagnosis not present

## 2020-05-16 MED FILL — DULoxetine HCL 30 MG CPEP: 30 | 30 days supply | Qty: 60 | Fill #5

## 2020-05-16 MED FILL — VITAMIN D3 50,000 UNITS CAP: 1.25 MG | 28 days supply | Qty: 8 | Fill #4

## 2020-05-19 ENCOUNTER — Other Ambulatory Visit: Payer: Self-pay

## 2020-05-19 ENCOUNTER — Ambulatory Visit
Admission: RE | Admit: 2020-05-19 | Discharge: 2020-05-19 | Disposition: A | Discharge status: Home / Self Care | Payer: 59 | Source: Ambulatory Visit | Attending: Family Medicine | Admitting: Family Medicine

## 2020-05-19 DIAGNOSIS — M25551 Pain in right hip: Secondary | ICD-10-CM

## 2020-05-21 NOTE — Addendum Note (Signed)
Addended by: Hortencia Pilar on: 05/21/2020 08:08 AM   Modules accepted: Orders

## 2020-05-22 MED ORDER — TRAMADOL HCL 50 MG PO TABS: 50.0000 mg | ORAL_TABLET | Freq: Every evening | ORAL | 0 refills | Status: DC | PRN

## 2020-05-22 MED FILL — traMADol HCL 50 MG TABS: 50 | 10 days supply | Qty: 20 | Fill #0

## 2020-05-22 MED FILL — tiZANidine HCL 4 MG TABS: 4 | 30 days supply | Qty: 30 | Fill #1

## 2020-05-22 NOTE — Addendum Note (Signed)
Addended by: Hortencia Pilar on: 05/22/2020 11:51 AM   Modules accepted: Orders

## 2020-05-30 ENCOUNTER — Other Ambulatory Visit: Payer: 59

## 2020-06-01 ENCOUNTER — Other Ambulatory Visit (HOSPITAL_COMMUNITY): Payer: Self-pay | Admitting: Cardiovascular Disease

## 2020-06-01 MED FILL — ROSUVASTATIN CALCIUM 10 MG: 10 | 90 days supply | Qty: 90 | Fill #0

## 2020-06-01 MED FILL — BYSTOLIC 2.5 MG TABLET: 2.5 | 90 days supply | Qty: 90 | Fill #0

## 2020-06-07 ENCOUNTER — Other Ambulatory Visit: Payer: Self-pay

## 2020-06-07 ENCOUNTER — Ambulatory Visit: Payer: Self-pay

## 2020-06-07 ENCOUNTER — Ambulatory Visit: Payer: 59 | Admitting: Physical Medicine and Rehabilitation

## 2020-06-07 ENCOUNTER — Encounter: Payer: Self-pay | Admitting: Physical Medicine and Rehabilitation

## 2020-06-07 DIAGNOSIS — Z96642 Presence of left artificial hip joint: Secondary | ICD-10-CM

## 2020-06-07 DIAGNOSIS — M25551 Pain in right hip: Secondary | ICD-10-CM

## 2020-06-07 NOTE — Progress Notes (Signed)
Pt state right hip pan. Pt state walking makes the pain worse. Pt state she lay down to makes the pain to ease the pain.  Numeric Pain Rating Scale and Functional Assessment Average Pain 6   In the last MONTH (on 0-10 scale) has pain interfered with the following?  1. General activity like being  able to carry out your everyday physical activities such as walking, climbing stairs, carrying groceries, or moving a chair?  Rating(9)   +Driver, -BT, -Dye Allergies.

## 2020-06-07 NOTE — Progress Notes (Signed)
Monique Clark - 54 y.o. female MRN [id number]  Date of birth: June 03, 1966  Office Visit Note: Visit Date: 06/07/2020 PCP: Lonia Mad, MD Referred by: Lonia Mad, MD  Subjective: Chief Complaint  Patient presents with  . Right Hip - Pain   HPI:  Monique Clark is a 54 y.o. female who comes in today at the request of Dr. [name] Blase for planned Right anesthetic hip arthrogram with fluoroscopic guidance.  The patient has failed conservative care including home exercise, medications, time and activity modification.  This injection will be diagnostic and hopefully therapeutic.  Please see requesting physician notes for further details and justification.   Patient has had 2 prior lumbar epidural injections without much relief of her right hip and thigh pain.  She has history of total left hip arthroplasty.  She had recent MRI of the right hip showing significant osteoarthritis and tearing of the labrum.  ROS Otherwise per HPI.  Assessment & Plan: Visit Diagnoses:  1. Pain in right hip   2. H/O total hip arthroplasty, left     Plan: Findings:  Patient had good relief during the anesthetic phase of the injection.    Meds & Orders: No orders of the defined types were placed in this encounter.   Orders Placed This Encounter  Procedures  . Large Joint Inj: R hip joint  . XR C-ARM NO REPORT    Follow-up: Return in about 2 weeks (around 06/21/2020) for  [name] Blase, M.D..   Procedures: Large Joint Inj: R hip joint on 06/07/2020 8:25 AM Indications: pain and diagnostic evaluation Details: 22 G needle, anterior approach  Arthrogram: Yes  Medications: 4 mL bupivacaine 0.25 %; 60 mg triamcinolone acetonide 40 MG/ML Outcome: tolerated well, no immediate complications  Arthrogram demonstrated excellent flow of contrast throughout the joint surface without extravasation or obvious defect.  The patient had relief of symptoms during the anesthetic phase of the  injection.  Procedure, treatment alternatives, risks and benefits explained, specific risks discussed. Consent was given by the patient. Immediately prior to procedure a time out was called to verify the correct patient, procedure, equipment, support staff and site/side marked as required. Patient was prepped and draped in the usual sterile fashion.      No notes on file   Clinical History: Right hip pain for approximately 2 months. No known injury.  EXAM: MR OF THE RIGHT HIP WITHOUT CONTRAST  TECHNIQUE: Multiplanar, multisequence MR imaging was performed. No intravenous contrast was administered.  COMPARISON:  None.  FINDINGS: Bones: No fracture, stress change or worrisome lesion is identified. There is subchondral cyst formation and edema in the right acetabulum. Osteophytosis about the right femoral head is seen. Artifact from a left hip arthroplasty is noted.  Articular cartilage and labrum  Articular cartilage: Diffusely degenerated with associated joint space narrowing.  Labrum: The anterior and superior labrum are severely degenerated and torn. Associated septated paralabral cyst off the anterior labrum measures 2.4 cm transverse x 0.6 cm AP x 3.1 cm craniocaudal.  Joint or bursal effusion  Joint effusion:  None.  Bursae: Negative.  Muscles and tendons  Muscles and tendons:  Appear intact.  Other findings  Miscellaneous: Imaged intrapelvic contents demonstrate some sigmoid diverticulosis. The patient is status post hysterectomy.  IMPRESSION: Markedly advanced for age right hip osteoarthritis with associated severe tearing of the anterior and superior labrum.  Diverticulosis.   Electronically Signed   By: Inge Rise M.D.   On: 05/20/2020 11:59 ---- MRI LUMBAR SPINE WITHOUT  CONTRAST    TECHNIQUE:  Multiplanar, multisequence MR imaging of the lumbar spine was  performed. No intravenous contrast was administered.     COMPARISON: Radiography 01/30/2020    FINDINGS:  Segmentation: 5 lumbar type vertebral bodies.    Alignment: 1 mm degenerative anterolisthesis L4-5.    Vertebrae: No fracture or primary bone lesion of significance. Few  scattered benign appearing hemangiomas.    Conus medullaris and cauda equina: Conus extends to the L1-2 level.  Conus and cauda equina appear normal.    Paraspinal and other soft tissues: Negative    Disc levels:    No significant disc or disc space finding at L1-2 or above.    L2-3: Mild desiccation and bulging of the disc. Mild bilateral facet  osteoarthritis. No compressive stenosis.    L3-4: Moderate bulging of the disc. Mild facet and ligamentous  hypertrophy. Narrowing of the lateral recesses left more than right.  Mild facet edema which could be associated with back pain or  referred facet syndrome pain.    L4-5: Moderate bulging of the disc. Bilateral facet osteoarthritis  allowing 1 mm of anterolisthesis. Mild stenosis of both lateral  recesses. Mild facet edema which could be associated with back pain  or referred facet syndrome pain.    L5-S1: Shallow disc protrusion in the left posterolateral direction.  Facet and ligamentous hypertrophy. Narrowing of both subarticular  lateral recesses left more than right. Some potential to affect  either S1 nerve, particularly the left. Mild facet edema could be  associated with back pain or referred facet syndrome pain.    IMPRESSION:  Degenerative disc disease and degenerative facet disease in the  lumbar spine from L2-3 through L5-S1. Throughout the region, the  facet arthropathy could be associated with back pain or referred  facet syndrome pain, joints showing most pronounced involvement at  L3-4 and L4-5. Disc bulges throughout the region contribute to  lateral recess stenosis that could potentially be symptomatic,  particularly at the L4-5 and L5-S1 levels.      Electronically  Signed  By: Nelson Chimes M.D.  On: 02/13/2020 08:01     Objective:  VS:  HT:    WT:   BMI:     BP:   HR: bpm  TEMP: ( )  RESP:  Physical Exam   Imaging: XR C-ARM NO REPORT  Result Date: 06/07/2020 Please see Notes tab for imaging impression.

## 2020-06-08 MED ORDER — BUPIVACAINE HCL 0.25 % IJ SOLN
4.0000 mL | INTRAMUSCULAR | Status: AC | PRN
  Administered 2020-06-07: 4 mL via INTRA_ARTICULAR

## 2020-06-08 MED ORDER — TRIAMCINOLONE ACETONIDE 40 MG/ML IJ SUSP
60.0000 mg | INTRAMUSCULAR | Status: AC | PRN
  Administered 2020-06-07: 60 mg via INTRA_ARTICULAR

## 2020-06-14 ENCOUNTER — Encounter: Payer: Self-pay | Admitting: Obstetrics and Gynecology

## 2020-06-14 ENCOUNTER — Ambulatory Visit: Payer: 59 | Admitting: Obstetrics and Gynecology

## 2020-06-14 ENCOUNTER — Other Ambulatory Visit: Payer: Self-pay

## 2020-06-14 VITALS — BP 126/84 | Ht 64.0 in | Wt 297.0 lb

## 2020-06-14 DIAGNOSIS — R4586 Emotional lability: Secondary | ICD-10-CM

## 2020-06-14 DIAGNOSIS — E349 Endocrine disorder, unspecified: Secondary | ICD-10-CM | POA: Diagnosis not present

## 2020-06-14 DIAGNOSIS — R7989 Other specified abnormal findings of blood chemistry: Secondary | ICD-10-CM

## 2020-06-14 LAB — PREGNANCY, URINE: Preg Test, Ur: NEGATIVE

## 2020-06-14 NOTE — Progress Notes (Signed)
Monique Clark May 27, 1966 [id number]  SUBJECTIVE:  54 y.o. G3P3003 female presents as a referral from Dr. Calton Dach, her regular internist. She presents with various symptoms including emotional lability, vaginal dryness, loss of sexual pleasure, also moderate hot flashes and night sweats which are manageable.  She has been worried because she has had low-level elevation of her beta hCG level which was checked by her primary doctor.  Levels have been in the range of 6-7.88 mIU/mL checked serially by her regular doctor between 03/2020 to 04/2020.  Cortisol level was normal and her CRP and ESR were mildly elevated.  Her ANA was negative.  FSH was 93.8, well into the menopausal range.  Her prolactin level is normal at 20.2.  She is not having any unusual abdominal pain does have sporadic abdominal pains that are thought to be due to diverticulosis, but not currently experiencing anything unusual.  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  Also with a history of atrial fibrillation and congenital heart disease.  Prior history also includes small B-cell lymphoma and polymyalgia rheumatica.  She also apparently had a PET scan which she says was negative including imaging of her abdomen and pelvis per her recollection.  She had a prior LAVH for abnormal uterine bleeding.  Did retain her ovaries.   She works as a Air cabin crew.  She says home life is good, she is married.  Relationship with her husband has seemingly fine.  Her children and stepchildren are all grown and out of the house except she has a 78 year old daughter with Down syndrome who has special needs which can be a stress as she is growing older.  The patient is a bit stressed about the thought of herself getting older and being there for her daughter.  Work is stable stress for her.   Pelvic ultrasound from 09/13/2019 indicated normal bilateral ovaries per the report.   Surgically absent uterus was noted.   Current Outpatient Medications  Medication Sig Dispense Refill  . aspirin 325 MG tablet Take 325 mg by mouth daily.     . Cholecalciferol (VITAMIN D3) 1.25 MG (50000 UT) CAPS Take 1 capsule by mouth See admin instructions. Take one capsule (50,000 unit) by mouth twice weekly - Mondays and Thursdays    . COENZYME Q10 PO Take 1 capsule by mouth daily.     . diphenhydrAMINE (BENADRYL) 25 MG tablet Take 50 mg by mouth See admin instructions. Take 2 tablets (50 mg) by mouth at bedtime as needed for sleep, and take 2 tablets (50 mg) with each dose of Hizentra    . DULoxetine (CYMBALTA) 30 MG capsule Take 3,060 mg by mouth daily.     . famotidine (PEPCID) 20 MG tablet Take 20 mg by mouth daily.     Marland Kitchen ibuprofen (ADVIL) 200 MG tablet Take 800 mg by mouth 3 (three) times daily.    . Immune Globulin, Human, (HIZENTRA) 4 GM/20ML SOLN Inject 36 g into the skin every 14 (fourteen) days. Monday    . nebivolol (BYSTOLIC) 2.5 MG tablet Take 1 tablet (2.5 mg total) by mouth 2 (two) times a day. (Patient taking differently: Take 2.5 mg by mouth daily. ) 60 tablet 0  . pramipexole (MIRAPEX) 0.25 MG tablet Take 0.25 mg by mouth at bedtime.    . rosuvastatin (CRESTOR) 10 MG tablet Take 1 tablet (10 mg total) by mouth daily at 6 PM. (Patient taking differently: Take 10 mg by  mouth daily. ) 30 tablet 0  . tiZANidine (ZANAFLEX) 4 MG tablet Take 4 mg by mouth at bedtime.    . mirabegron ER (MYRBETRIQ) 50 MG TB24 tablet Take 50 mg by mouth daily. (Patient not taking: Reported on 06/14/2020)    . traMADol (ULTRAM) 50 MG tablet Take 1-2 tablets (50-100 mg total) by mouth at bedtime as needed. (Patient not taking: Reported on 06/14/2020) 20 tablet 0   Current Facility-Administered Medications  Medication Dose Route Frequency Provider Last Rate Last Admin  . 0.9 %  sodium chloride infusion  500 mL Intravenous Continuous Irene Shipper, MD       Allergies: Nizatidine, Promethazine, and  Tetanus toxoids  No LMP recorded. Patient has had a hysterectomy.  Past medical history,surgical history, problem list, medications, allergies, family history and social history were all reviewed and documented as reviewed in the EPIC chart.  ROS: Pertinent positives and negatives as reviewed in HPI   OBJECTIVE:  Ht '5\' 4"'  (1.626 m)   Wt 297 lb (134.7 kg)   BMI 50.98 kg/m  The patient appears well, alert, oriented, in no distress.  Mildly emotional and tearful at times throughout the course of today's discussion PELVIC EXAM: Exam deferred due to the consultative nature of the visit   ASSESSMENT:  54 y.o. [id number] here for discussion of persistent low level elevation of serum beta hCG and emotional lability  PLAN:  We discussed the concept of heterophilic antibody as she has had a persistent low level elevation of her serum beta hCG level that was incidentally discovered during the work-up for her chief complaint.  My understanding is that heterophilic antibody can lead to false positive test results for serum beta hCG testing, characteristically with a negative urine beta hCG test.  We will check a urine pregnancy test today and a negative result will further demonstrate this theory.  Menopausal patients occasionally can have elevated serum beta hCG levels from a pituitary source.  She describes no vision or peripheral vision changes, no unusual headaches.  Uncertain if she has had any head imaging but without symptoms I do not think that would be a high priority.  Other more rare causes would include a transitional trophoblastic disease, which would essentially be impossible in a menopausal woman with a previous hysterectomy.  Germ cell tumors with trophoblastic elements can also be active of hCG.  She is not taking any exogenous hCG.  A condition known as familial hCG can result in low-level elevations but would require testing of close relatives to see if that is a possibility.  Heterophilic  antibodies can be formed in response to animal antibody antigens.  She also receives immunotherapy but I do not know if that would potentially provide any interference for the hCG serum test.  To further look into these possibilities I would recommend referral to an academic immunology specialist.  For now from a clinical surveillance standpoint, I think it be reasonable to repeat the pelvic ultrasound 1 year out from her last one to demonstrate continued normal ovaries and absence of any pelvic/gynecologic tumors.  I told her that I think it would be a good idea to check in periodically on the hCG level 2-3 times per year just to make sure it is not elevating any further.  Lastly, to address her emotional lability, I highly suggest that she seek behavioral health and counseling specialist referral to further delve into the challenges that are contributing to her emotional state.  It is certainly possible  that the menopausal transition and increasingly hypoestrogenic state are exacerbating this, but as we discussed today, she is not a good candidate for estrogen replacement therapy with her other medical conditions.  She is welcome to schedule the pelvic ultrasound here or with her regular health facility.  She is invited to return with any questions or further concerns.   Joseph Pierini MD 06/14/20

## 2020-06-15 ENCOUNTER — Ambulatory Visit: Payer: 59 | Admitting: Family Medicine

## 2020-06-15 MED FILL — DULoxetine HCL 30 MG CPEP: 30 | 30 days supply | Qty: 60 | Fill #6

## 2020-06-15 MED FILL — tiZANidine HCL 4 MG TABS: 4 | 30 days supply | Qty: 30 | Fill #2

## 2020-06-15 MED FILL — VITAMIN D3 50,000 UNITS CAP: 1.25 MG | 28 days supply | Qty: 8 | Fill #5

## 2020-06-17 DIAGNOSIS — D801 Nonfamilial hypogammaglobulinemia: Secondary | ICD-10-CM | POA: Diagnosis not present

## 2020-06-19 ENCOUNTER — Ambulatory Visit (INDEPENDENT_AMBULATORY_CARE_PROVIDER_SITE_OTHER): Payer: 59 | Admitting: Family Medicine

## 2020-06-19 ENCOUNTER — Other Ambulatory Visit: Payer: Self-pay

## 2020-06-19 ENCOUNTER — Encounter: Payer: Self-pay | Admitting: Family Medicine

## 2020-06-19 DIAGNOSIS — M25551 Pain in right hip: Secondary | ICD-10-CM

## 2020-06-19 IMAGING — DX CHEST - 2 VIEW
2 series · 2 of 2 positions shown · non-contrast
Comparison: None.

CLINICAL DATA: Dizziness.  Chest discomfort.

EXAM:
CHEST - 2 VIEW

[chest pa]
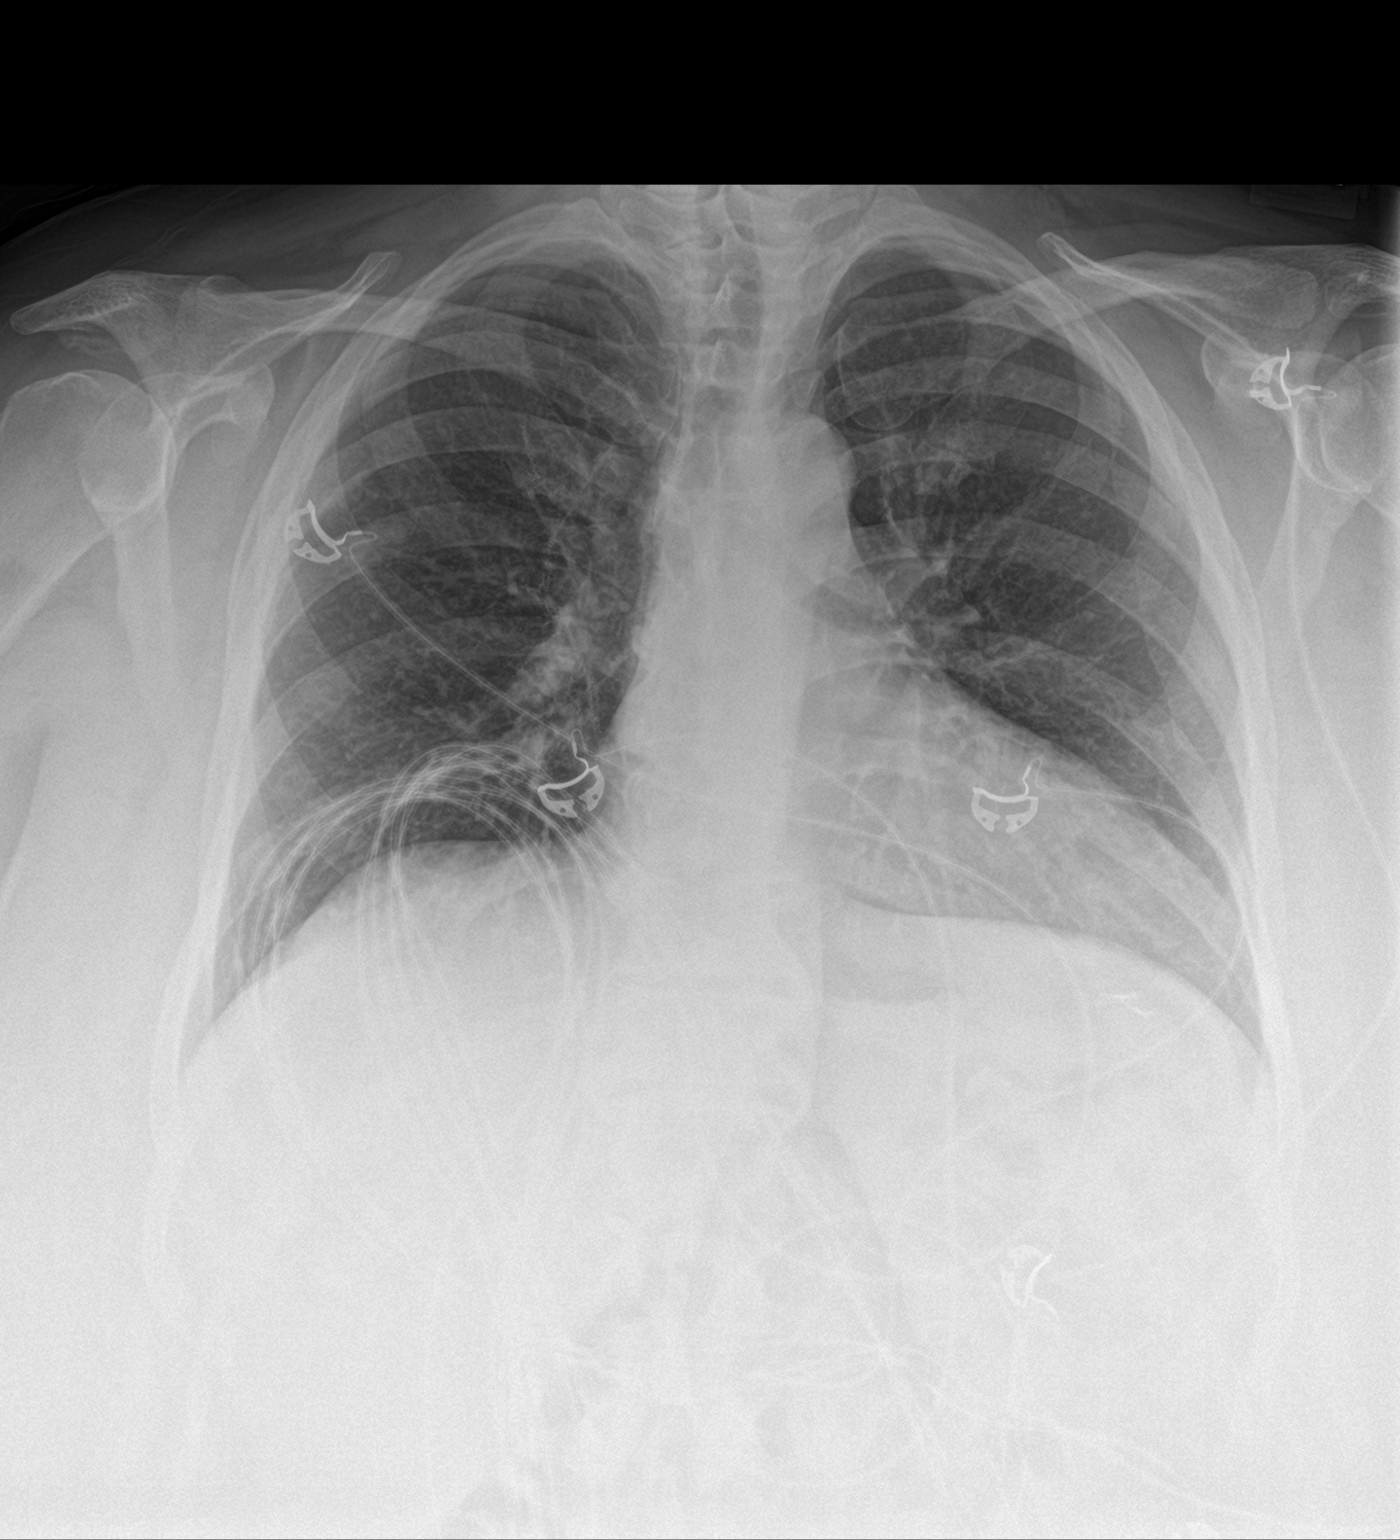

[chest lat]
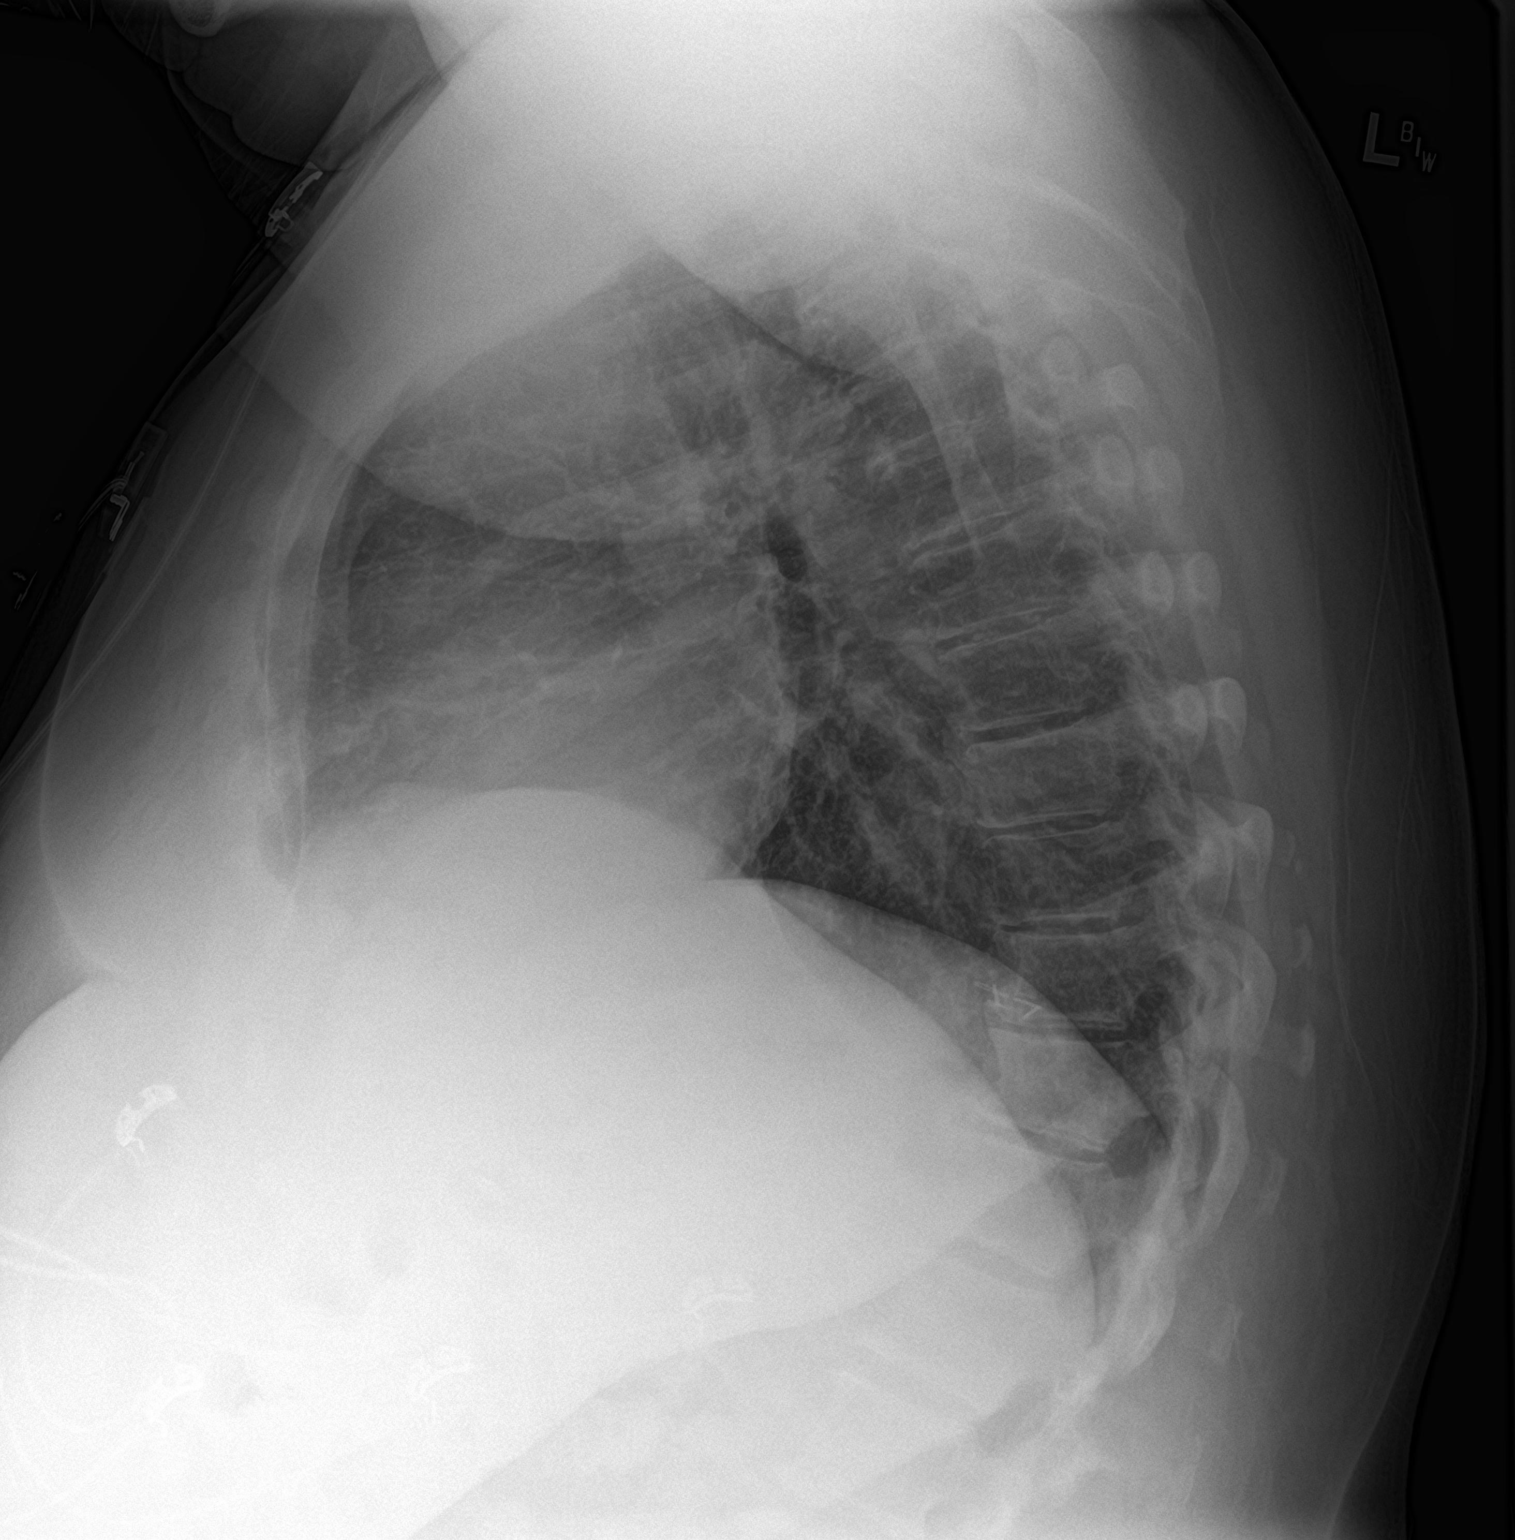

[2 of 2 positions shown; findings below may reference images not displayed]

FINDINGS: The heart size and mediastinal contours are within normal limits.
Both lungs are clear. The visualized skeletal structures are
unremarkable.
IMPRESSION: No active cardiopulmonary disease.

## 2020-06-19 MED ORDER — TRAMADOL HCL 50 MG PO TABS: 50.0000 mg | ORAL_TABLET | Freq: Every evening | ORAL | 0 refills | Status: DC | PRN

## 2020-06-19 MED FILL — traMADol HCL 50 MG TABS: 50 | 30 days supply | Qty: 60 | Fill #0

## 2020-06-19 NOTE — Progress Notes (Signed)
Office Visit Note   Patient: Monique Clark           Date of Birth: Jul 16, 1966           MRN: [id number] Visit Date: 06/19/2020 Requested by: Lonia Mad, MD No address on file PCP: Lonia Mad, MD  Subjective: Chief Complaint  Patient presents with  . Right Hip - Pain    Follow up post hip injection from Dr. Ernestina Patches on 06/07/20. Had a few days' relief - was able to walk upright. Has worn off, though.    HPI: She is here for follow-up chronic right hip pain.  Since last visit she had a diagnostic injection of the hip per Dr. Ernestina Patches.  She had excellent relief during the anesthetic phase.  Unfortunately she did not get lasting relief.  She is interested in proceeding with hip replacement but her BMI is still well above 40.  She is contemplating going back up [place] to her previous surgeon since he was willing to operate at a higher BMI.  Her pain is worst at night, making it difficult to fall asleep.  Tramadol did seem to help but she does not have any.               ROS:   All other systems were reviewed and are negative.  Objective: Vital Signs: There were no vitals taken for this visit.  Physical Exam:  General:  Alert and oriented, in no acute distress. Pulm:  Breathing unlabored. Psy:  Normal mood, congruent affect.    Imaging: No results found.  Assessment & Plan: 1.  Chronic right hip pain due to DJD and labrum tear -Refilled tramadol to take at night as needed.  She will contact me if she decides to consult with one of our surgeons for hip replacement, presuming she is able to get her BMI in target range.  Otherwise she may decide to go up [place].     Procedures: No procedures performed  No notes on file     PMFS History: Patient Active Problem List   Diagnosis Date Noted  . Paroxysmal atrial fibrillation (HCC)   . Localized swelling of left lower extremity   . Chest pain 03/13/2019  . Lightheadedness   . Essential hypertension   . History of left  hip replacement 07/29/2018  . BMI 45.0-49.9, adult ([place]) 12/28/2015  . Benign hypertension 12/12/2015  . CVID (common variable immunodeficiency) ([place]) 05/07/2015  . Pneumonia 05/01/2015  . Post-nasal discharge 03/30/2013  . Prediabetes 01/20/2013  . S/P splenectomy 01/20/2013  . History of non-Hodgkin's lymphoma 12/21/2012  . Low back pain radiating to right lower extremity 12/10/2012  . Right lumbar radiculitis 12/10/2012   Past Medical History:  Diagnosis Date  . Abscess   . Arthritis   . Atrial fibrillation ([place])   . Cardiac arrhythmia due to congenital heart disease   . CVID (common variable immunodeficiency) ([place]) 2015  . Diverticulosis   . Elevated cholesterol   . High blood pressure   . Non Hodgkin's lymphoma ([place])   . Obesity   . Pneumonia   . POTS (postural orthostatic tachycardia syndrome)   . UTI (urinary tract infection)     Family History  Problem Relation Age of Onset  . Atrial fibrillation Mother   . Cancer Father        Colon  . Atrial fibrillation Maternal Uncle   . Atrial fibrillation Maternal Grandmother   . Atrial fibrillation Maternal Grandfather   . Heart attack Maternal  Grandfather     Past Surgical History:  Procedure Laterality Date  . ABDOMINAL HYSTERECTOMY    . APPENDECTOMY    . CESAREAN SECTION    . GALLBLADDER SURGERY    . JOINT REPLACEMENT Left    hip  . left shoulder repair    . LUNG REMOVAL, PARTIAL    . myringostomy Bilateral   . SPLENECTOMY, TOTAL    . TONSILLECTOMY AND ADENOIDECTOMY     Social History   Occupational History  . Occupation: rn  Tobacco Use  . Smoking status: Former Smoker    Packs/day: 0.50    Types: Cigarettes    Quit date: 09/29/2002    Years since quitting: 17.7  . Smokeless tobacco: Never Used  Vaping Use  . Vaping Use: Never used  Substance and Sexual Activity  . Alcohol use: No  . Drug use: No  . Sexual activity: Yes    Birth control/protection: Surgical, Post-menopausal    Comment: 1st  intercourse 59 yo-5 partners

## 2020-06-20 IMAGING — CT CT HEAR MORPH WITH CTA COR WITH SCORE WITH CA WITH CONTRAST AND
2 of 11 series · 5 of 20 positions shown, 6 images · IV contrast (APPLIED)
Comparison: None.
COMPARISON: None.

Addendum:
EXAM:
OVER-READ INTERPRETATION  CT CHEST

The following report is an over-read performed by radiologist Dr.
Jiletu Utada [REDACTED] on 03/16/2019. This
over-read does not include interpretation of cardiac or coronary
anatomy or pathology. The coronary CTA interpretation by the
cardiologist is attached.
CLINICAL DATA: 53-year-old female with chest pain.
Cardiac/Coronary  CT
TECHNIQUE: The patient was scanned on a [employer] Force scanner.

[Series 15: best diast 71 % · axial · 0.38mm/px · z∈[+960,+1071]mm · 3 of 278 slices shown, 4 images]
[im 1/278  vessel]
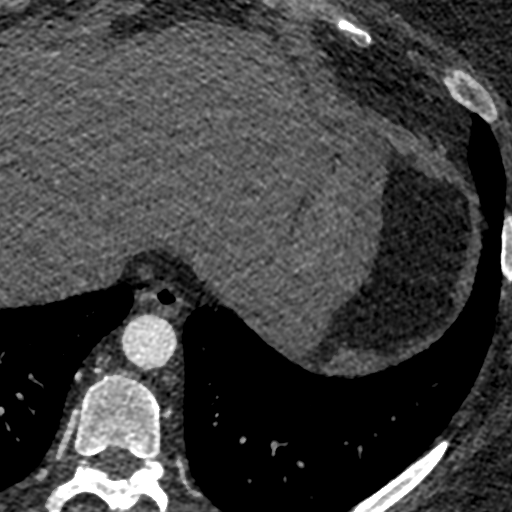
[im 1/278  lung]
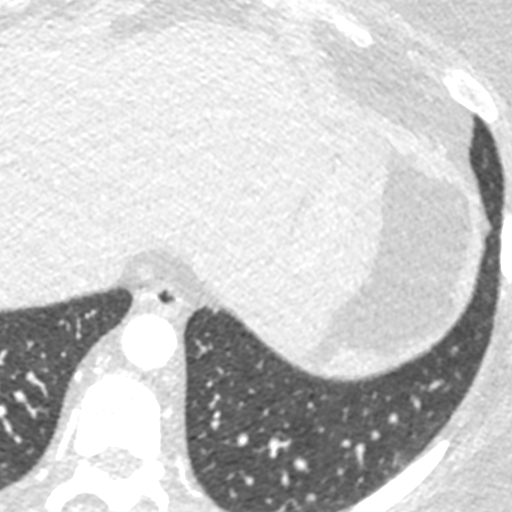
[im 139/278  vessel]
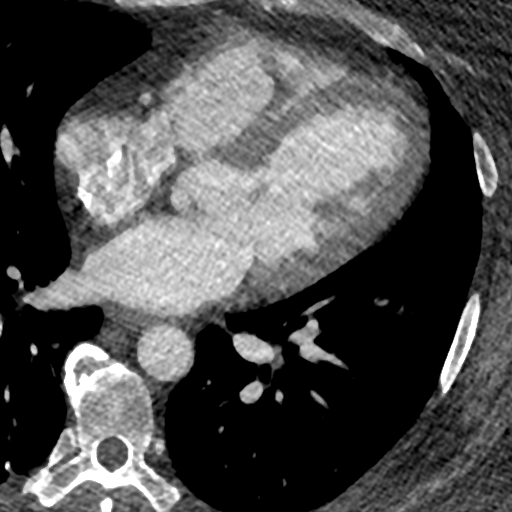
[im 278/278  vessel]
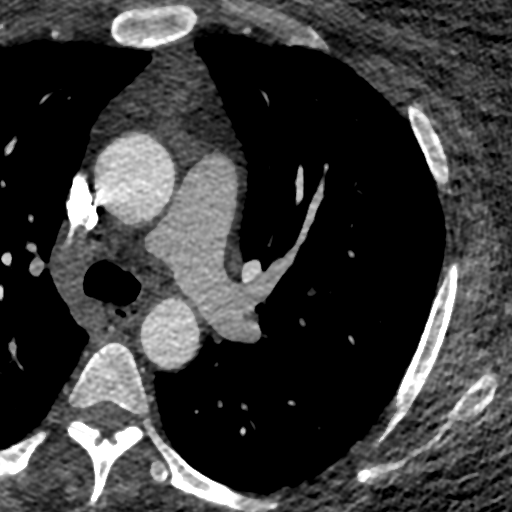

[Series 18: ts syst sharp 40 % · axial · 0.38mm/px · z∈[+997,+1034]mm · 2 of 278 slices shown]
[im 93/278  lung]
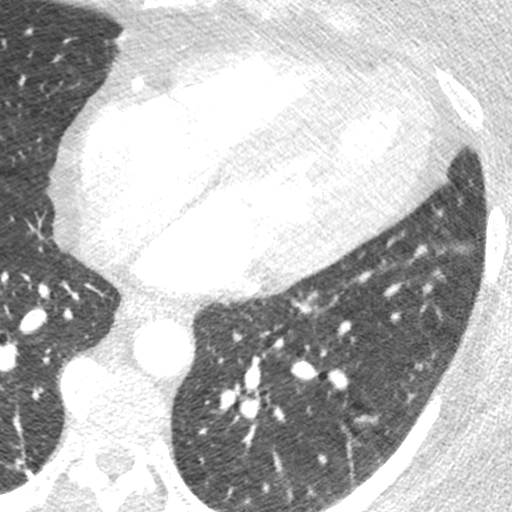
[im 185/278  lung]
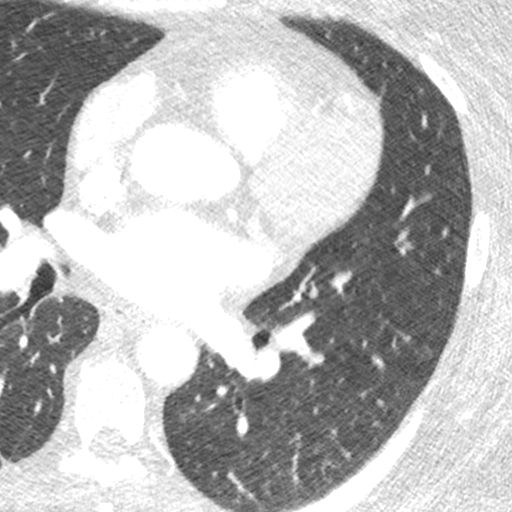

[5 of 20 positions shown; findings below may reference images not displayed]

FINDINGS: Vascular: No incidental vascular findings.

Mediastinum/Nodes: No lymphadenopathy identified in the visualized
mediastinum or hilar regions. Slight hazy opacity in the upper
anterior mediastinum likely relates to thymic remnant tissue.

Lungs/Pleura: There is some scarring in the lingula. Visualized
lungs show no evidence of pulmonary edema, consolidation,
pneumothorax, nodule or pleural fluid.

Upper Abdomen: The visualized upper abdomen demonstrates probable
steatosis of the liver.

Musculoskeletal: No chest wall mass or suspicious bone lesions
identified.
IMPRESSION: 1. Probable thymic remnant tissue in the anterior mediastinum.
2. Probable steatosis of the liver.
FINDINGS: A 120 kV prospective scan was triggered in the descending thoracic
aorta at 111 HU's. Axial non-contrast 3 mm slices were carried out
through the heart. The data set was analyzed on a dedicated work
station and scored using the Agatson method. Gantry rotation speed
was 250 msecs and collimation was .6 mm. No beta blockade and 0.8 mg
of sl NTG was given. The 3D data set was reconstructed in 5%
intervals of the 67-82 % of the R-R cycle. Diastolic phases were
analyzed on a dedicated work station using MPR, MIP and VRT modes.
The patient received 80 cc of contrast.

Aorta: Normal size. Minimal atherosclerotic plaque and no
calcifications. No dissection.

Aortic Valve:  Trileaflet.  No calcifications.

Coronary Arteries:  Normal coronary origin.  Right dominance.

RCA is a very large dominant artery that gives rise to PDA and PLA.
There is no plaque.

Left main is a large artery that gives rise to LAD and LCX arteries.
Left main has no plaque.

LAD is a medium caliber tortuous vessel that gives rise to one small
diagonal artery. There is minimal calcified plaque in the proximal
LAD with stenosis 0-25%.

LCX is a non-dominant artery that has no obvious plaque.

Other findings:

Normal pulmonary vein drainage into the left atrium.

Normal let atrial appendage without a thrombus.

Normal size of the pulmonary artery.
IMPRESSION: 1. Coronary calcium score of 4. This was 82 percentile for age and
sex matched control.

2. Normal coronary origin with right dominance.

3. Study quality is affected by patient's size, however there is
only minimal non-obstructive CAD, risk factor modification is
recommended.

*** End of Addendum ***
EXAM:
OVER-READ INTERPRETATION  CT CHEST

The following report is an over-read performed by radiologist Dr.
Jiletu Utada [REDACTED] on 03/16/2019. This
over-read does not include interpretation of cardiac or coronary
anatomy or pathology. The coronary CTA interpretation by the
cardiologist is attached.
FINDINGS: Vascular: No incidental vascular findings.

Mediastinum/Nodes: No lymphadenopathy identified in the visualized
mediastinum or hilar regions. Slight hazy opacity in the upper
anterior mediastinum likely relates to thymic remnant tissue.

Lungs/Pleura: There is some scarring in the lingula. Visualized
lungs show no evidence of pulmonary edema, consolidation,
pneumothorax, nodule or pleural fluid.

Upper Abdomen: The visualized upper abdomen demonstrates probable
steatosis of the liver.

Musculoskeletal: No chest wall mass or suspicious bone lesions
identified.
IMPRESSION: 1. Probable thymic remnant tissue in the anterior mediastinum.
2. Probable steatosis of the liver.

## 2020-07-02 ENCOUNTER — Ambulatory Visit (INDEPENDENT_AMBULATORY_CARE_PROVIDER_SITE_OTHER): Payer: 59 | Admitting: Endocrinology

## 2020-07-02 ENCOUNTER — Encounter: Payer: Self-pay | Admitting: Endocrinology

## 2020-07-02 ENCOUNTER — Other Ambulatory Visit: Payer: Self-pay

## 2020-07-02 DIAGNOSIS — E349 Endocrine disorder, unspecified: Secondary | ICD-10-CM | POA: Diagnosis not present

## 2020-07-02 LAB — HCG, QUANTITATIVE, PREGNANCY: Quantitative HCG: 6.51 m[IU]/mL

## 2020-07-02 NOTE — Progress Notes (Signed)
Subjective:    Patient ID: Monique Clark, female    DOB: 28-Mar-1966, 54 y.o.   MRN: [id number]  HPI Pt is ref by Dr. Calton Dach, for elev hCG.  This was noted 6/21.  Main symptom is fatigue.  She had TAH (but no BSO) in 2017.  She had menses up to that point.   Past Medical History:  Diagnosis Date  . Abscess   . Arthritis   . Atrial fibrillation ([place])   . Cardiac arrhythmia due to congenital heart disease   . CVID (common variable immunodeficiency) ([place]) 2015  . Diverticulosis   . Elevated cholesterol   . High blood pressure   . Non Hodgkin's lymphoma ([place])   . Obesity   . Pneumonia   . POTS (postural orthostatic tachycardia syndrome)   . UTI (urinary tract infection)     Past Surgical History:  Procedure Laterality Date  . ABDOMINAL HYSTERECTOMY    . APPENDECTOMY    . CESAREAN SECTION    . GALLBLADDER SURGERY    . JOINT REPLACEMENT Left    hip  . left shoulder repair    . LUNG REMOVAL, PARTIAL    . myringostomy Bilateral   . SPLENECTOMY, TOTAL    . TONSILLECTOMY AND ADENOIDECTOMY      Social History   Socioeconomic History  . Marital status: Married    Spouse name: Not on file  . Number of children: 3  . Years of education: Not on file  . Highest education level: Not on file  Occupational History  . Occupation: rn  Tobacco Use  . Smoking status: Former Smoker    Packs/day: 0.50    Types: Cigarettes    Quit date: 09/29/2002    Years since quitting: 17.7  . Smokeless tobacco: Never Used  Vaping Use  . Vaping Use: Never used  Substance and Sexual Activity  . Alcohol use: No  . Drug use: No  . Sexual activity: Yes    Birth control/protection: Surgical, Post-menopausal    Comment: 1st intercourse 3 yo-5 partners  Other Topics Concern  . Not on file  Social History Narrative  . Not on file   Social Determinants of Health   Financial Resource Strain:   . Difficulty of Paying Living Expenses: Not on file  Food Insecurity:   . Worried About Paediatric nurse in the Last Year: Not on file  . Ran Out of Food in the Last Year: Not on file  Transportation Needs:   . Lack of Transportation (Medical): Not on file  . Lack of Transportation (Non-Medical): Not on file  Physical Activity:   . Days of Exercise per Week: Not on file  . Minutes of Exercise per Session: Not on file  Stress:   . Feeling of Stress : Not on file  Social Connections:   . Frequency of Communication with Friends and Family: Not on file  . Frequency of Social Gatherings with Friends and Family: Not on file  . Attends Religious Services: Not on file  . Active Member of Clubs or Organizations: Not on file  . Attends Archivist Meetings: Not on file  . Marital Status: Not on file  Intimate Partner Violence:   . Fear of Current or Ex-Partner: Not on file  . Emotionally Abused: Not on file  . Physically Abused: Not on file  . Sexually Abused: Not on file    Current Outpatient Medications on File Prior to Visit  Medication Sig Dispense Refill  .  aspirin 325 MG tablet Take 325 mg by mouth daily.     . Cholecalciferol (VITAMIN D3) 1.25 MG (50000 UT) CAPS Take 1 capsule by mouth See admin instructions. Take one capsule (50,000 unit) by mouth twice weekly - Mondays and Thursdays    . COENZYME Q10 PO Take 1 capsule by mouth daily.     . diphenhydrAMINE (BENADRYL) 25 MG tablet Take 50 mg by mouth See admin instructions. Take 2 tablets (50 mg) by mouth at bedtime as needed for sleep, and take 2 tablets (50 mg) with each dose of Hizentra    . DULoxetine (CYMBALTA) 30 MG capsule Take 3,060 mg by mouth daily.     . famotidine (PEPCID) 20 MG tablet Take 20 mg by mouth daily.     Marland Kitchen ibuprofen (ADVIL) 200 MG tablet Take 800 mg by mouth 3 (three) times daily.    . Immune Globulin, Human, (HIZENTRA) 4 GM/20ML SOLN Inject 36 g into the skin every 14 (fourteen) days. Monday    . mirabegron ER (MYRBETRIQ) 50 MG TB24 tablet Take 50 mg by mouth daily.     . nebivolol (BYSTOLIC)  2.5 MG tablet Take 1 tablet (2.5 mg total) by mouth 2 (two) times a day. (Patient taking differently: Take 2.5 mg by mouth daily. ) 60 tablet 0  . pramipexole (MIRAPEX) 0.25 MG tablet Take 0.25 mg by mouth at bedtime.    . rosuvastatin (CRESTOR) 10 MG tablet Take 1 tablet (10 mg total) by mouth daily at 6 PM. (Patient taking differently: Take 10 mg by mouth daily. ) 30 tablet 0  . tiZANidine (ZANAFLEX) 4 MG tablet Take 4 mg by mouth at bedtime.    . traMADol (ULTRAM) 50 MG tablet Take 1-2 tablets (50-100 mg total) by mouth at bedtime as needed. 60 tablet 0   Current Facility-Administered Medications on File Prior to Visit  Medication Dose Route Frequency Provider Last Rate Last Admin  . 0.9 %  sodium chloride infusion  500 mL Intravenous Continuous Irene Shipper, MD        Allergies  Allergen Reactions  . Nizatidine Hives    Axid  . Promethazine Other (See Comments)    High doses causes confusion  . Tetanus Toxoids Other (See Comments)    angioedema    Family History  Problem Relation Age of Onset  . Atrial fibrillation Mother   . Cancer Father        Colon  . Atrial fibrillation Maternal Uncle   . Atrial fibrillation Maternal Grandmother   . Atrial fibrillation Maternal Grandfather   . Heart attack Maternal Grandfather   . Ovarian cancer Neg Hx     BP (!) 156/94   Pulse 66   Ht 5\' 4"  (1.626 m)   Wt (!) 302 lb 9.6 oz (137.3 kg)   SpO2 96%   BMI 51.94 kg/m    Review of Systems Denies fever.  She has tremor of the hands, and chronic pain at the right hip.   Denies excessive diaph.  She has it is difficult for her to ascertain pelvic pain, due to other med probs.  pelvic pain.  She has gained a few lbs.      Objective:   Physical Exam VS: see vs page GEN: no distress HEAD: head: no deformity eyes: no periorbital swelling, no proptosis external nose and ears are normal NECK: supple, thyroid is not enlarged CHEST WALL: no deformity LUNGS: clear to auscultation.   CV:  reg rate and rhythm,  no murmur.  MUSCULOSKELETAL: muscle bulk and strength are grossly normal.  no obvious joint swelling.  gait is steady, with a walker EXTEMITIES: no deformity.  Trace bilat leg edema.   PULSES: no carotid bruit NEURO:  cn 2-12 grossly intact.   readily moves all 4's.  sensation is intact to touch on all 4's.  Slight tremor of the hands.   SKIN:  Normal texture and temperature.  No rash or suspicious lesion is visible.   NODES:  None palpable at the neck PSYCH: alert, well-oriented.  Does not appear anxious nor depressed.  Lab Results  Component Value Date   TSH 1.430 03/13/2019  pt says TFT were normal earlier in 2021  I have reviewed outside records, and summarized:  Pt was noted to have elevated hCG, and referred here.  She saw Dr Delilah Shan, who advised pt to follow hCG levels a few times per year.        Assessment & Plan:  HTN: is noted today.  elev hCG, new to me, uncertain etiology and prognosis.    Patient Instructions  Your blood pressure is high today.  Please see your primary care provider soon, to have it rechecked.   Please also do the ultrasound as scheduled. Blood tests are requested for you today.  We'll let you know about the results, of both of these.

## 2020-07-02 NOTE — Patient Instructions (Addendum)
Your blood pressure is high today.  Please see your primary care provider soon, to have it rechecked.   Please also do the ultrasound as scheduled. Blood tests are requested for you today.  We'll let you know about the results, of both of these.

## 2020-07-03 LAB — FOLLICLE STIMULATING HORMONE: FSH: 81.1 m[IU]/mL

## 2020-07-03 LAB — LUTEINIZING HORMONE: LH: 47.23 m[IU]/mL

## 2020-07-03 LAB — HUMAN CHORIONIC GONADOTROPIN(HCG),B-SUBUNIT,QUANTITATIVE): HCG, Beta Chain, Quant, S: 4 m[IU]/mL

## 2020-07-09 ENCOUNTER — Ambulatory Visit: Payer: 59 | Admitting: Endocrinology

## 2020-07-11 ENCOUNTER — Ambulatory Visit (INDEPENDENT_AMBULATORY_CARE_PROVIDER_SITE_OTHER): Payer: 59

## 2020-07-11 ENCOUNTER — Ambulatory Visit: Payer: 59 | Admitting: Obstetrics and Gynecology

## 2020-07-11 ENCOUNTER — Encounter: Payer: Self-pay | Admitting: Obstetrics and Gynecology

## 2020-07-11 ENCOUNTER — Other Ambulatory Visit: Payer: Self-pay

## 2020-07-11 VITALS — BP 128/82

## 2020-07-11 DIAGNOSIS — R7989 Other specified abnormal findings of blood chemistry: Secondary | ICD-10-CM

## 2020-07-11 DIAGNOSIS — D801 Nonfamilial hypogammaglobulinemia: Secondary | ICD-10-CM | POA: Diagnosis not present

## 2020-07-11 DIAGNOSIS — R939 Diagnostic imaging inconclusive due to excess body fat of patient: Secondary | ICD-10-CM

## 2020-07-11 DIAGNOSIS — E349 Endocrine disorder, unspecified: Secondary | ICD-10-CM

## 2020-07-11 NOTE — Progress Notes (Signed)
   Monique Clark Mar 25, 1966 [id number]  SUBJECTIVE:  54 y.o. 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  Allergies: Nizatidine, Promethazine, and Tetanus toxoids  No LMP recorded. Patient has had a hysterectomy.  Past medical history,surgical history, problem list, medications, allergies, family history and social history were all reviewed and documented as reviewed in the EPIC chart.  OBJECTIVE:  BP 128/82   Pelvic ultrasound Surgically absent uterus.  Vaginal cuff is normal. Bilateral ovaries difficult to visualize due to overlying bowel and body habitus. No visible adnexal masses.  No free fluid.   ASSESSMENT:  54 y.o. [id number] here for pelvic ultrasound, heterophilic antibody  PLAN:  Patient is reassured by unremarkable pelvic ultrasound.  Recent blood work including [place] and LH indicated levels normal for menopause.  hCG quantitative level only at 4 mIU/mL.  I do not think the patient needs to continue with serial ultrasound monitoring at this point unless she develops any concerning pelvic symptoms not attributable to other causes.  For reassurance purposes, she could consider continue with annual hCG screens for the next few years.  Patient is comfortable with this plan.  She will follow up with [place] as needed.   Joseph Pierini MD 07/11/20

## 2020-07-18 ENCOUNTER — Other Ambulatory Visit (HOSPITAL_COMMUNITY): Payer: Self-pay | Admitting: Internal Medicine

## 2020-07-18 MED FILL — tiZANidine HCL 4 MG TABS: 4 | 30 days supply | Qty: 30 | Fill #3

## 2020-07-18 MED FILL — VITAMIN D3 50,000 UNITS CAP: 1.25 MG | 84 days supply | Qty: 24 | Fill #0

## 2020-07-18 MED FILL — DULoxetine HCL 30 MG CPEP: 30 | 30 days supply | Qty: 60 | Fill #7

## 2020-07-25 DIAGNOSIS — I4891 Unspecified atrial fibrillation: Secondary | ICD-10-CM | POA: Diagnosis not present

## 2020-07-25 DIAGNOSIS — I10 Essential (primary) hypertension: Secondary | ICD-10-CM | POA: Diagnosis not present

## 2020-07-25 DIAGNOSIS — R5383 Other fatigue: Secondary | ICD-10-CM | POA: Diagnosis not present

## 2020-07-25 DIAGNOSIS — R0789 Other chest pain: Secondary | ICD-10-CM | POA: Diagnosis not present

## 2020-07-26 DIAGNOSIS — M25551 Pain in right hip: Secondary | ICD-10-CM | POA: Diagnosis not present

## 2020-07-26 DIAGNOSIS — M87052 Idiopathic aseptic necrosis of left femur: Secondary | ICD-10-CM | POA: Diagnosis not present

## 2020-07-26 DIAGNOSIS — M1611 Unilateral primary osteoarthritis, right hip: Secondary | ICD-10-CM | POA: Diagnosis not present

## 2020-07-26 DIAGNOSIS — M1612 Unilateral primary osteoarthritis, left hip: Secondary | ICD-10-CM | POA: Diagnosis not present

## 2020-07-26 DIAGNOSIS — Z96642 Presence of left artificial hip joint: Secondary | ICD-10-CM | POA: Diagnosis not present

## 2020-08-06 ENCOUNTER — Encounter: Payer: Self-pay | Admitting: Family Medicine

## 2020-08-06 ENCOUNTER — Other Ambulatory Visit: Payer: Self-pay | Admitting: Family Medicine

## 2020-08-06 MED ORDER — TRAMADOL HCL 50 MG PO TABS: 50.0000 mg | ORAL_TABLET | Freq: Every evening | ORAL | 0 refills | Status: DC | PRN

## 2020-08-06 MED FILL — traMADol HCL 50 MG TABS: 50 | 30 days supply | Qty: 60 | Fill #0

## 2020-08-12 DIAGNOSIS — D801 Nonfamilial hypogammaglobulinemia: Secondary | ICD-10-CM | POA: Diagnosis not present

## 2020-08-12 DIAGNOSIS — R0789 Other chest pain: Secondary | ICD-10-CM | POA: Diagnosis not present

## 2020-08-15 DIAGNOSIS — R0789 Other chest pain: Secondary | ICD-10-CM | POA: Diagnosis not present

## 2020-08-15 DIAGNOSIS — R079 Chest pain, unspecified: Secondary | ICD-10-CM | POA: Diagnosis not present

## 2020-08-15 DIAGNOSIS — I4891 Unspecified atrial fibrillation: Secondary | ICD-10-CM | POA: Diagnosis not present

## 2020-08-15 DIAGNOSIS — I422 Other hypertrophic cardiomyopathy: Secondary | ICD-10-CM | POA: Diagnosis not present

## 2020-08-15 DIAGNOSIS — I340 Nonrheumatic mitral (valve) insufficiency: Secondary | ICD-10-CM | POA: Diagnosis not present

## 2020-08-15 DIAGNOSIS — R5383 Other fatigue: Secondary | ICD-10-CM | POA: Diagnosis not present

## 2020-08-15 DIAGNOSIS — I10 Essential (primary) hypertension: Secondary | ICD-10-CM | POA: Diagnosis not present

## 2020-08-17 DIAGNOSIS — I471 Supraventricular tachycardia: Secondary | ICD-10-CM | POA: Diagnosis not present

## 2020-08-17 DIAGNOSIS — I480 Paroxysmal atrial fibrillation: Secondary | ICD-10-CM | POA: Diagnosis not present

## 2020-08-17 DIAGNOSIS — R0789 Other chest pain: Secondary | ICD-10-CM | POA: Diagnosis not present

## 2020-08-21 ENCOUNTER — Encounter: Payer: Self-pay | Admitting: Family Medicine

## 2020-08-21 DIAGNOSIS — M25551 Pain in right hip: Secondary | ICD-10-CM

## 2020-08-28 MED FILL — DULoxetine HCL 30 MG CPEP: 30 | 30 days supply | Qty: 60 | Fill #8

## 2020-08-28 MED FILL — tiZANidine HCL 4 MG TABS: 4 | 30 days supply | Qty: 30 | Fill #4

## 2020-09-05 ENCOUNTER — Encounter: Payer: Self-pay | Admitting: Family Medicine

## 2020-09-06 ENCOUNTER — Other Ambulatory Visit: Payer: Self-pay | Admitting: Family Medicine

## 2020-09-06 MED ORDER — METHOCARBAMOL 750 MG PO TABS: 750.0000 mg | ORAL_TABLET | Freq: Four times a day (QID) | ORAL | 1 refills | Status: DC | PRN

## 2020-09-06 MED ORDER — TRAMADOL HCL 50 MG PO TABS: 50.0000 mg | ORAL_TABLET | Freq: Every evening | ORAL | 0 refills | Status: DC | PRN

## 2020-09-06 MED FILL — METHOCARBAMOL 750 MG TABS: 750 | 30 days supply | Qty: 120 | Fill #0

## 2020-09-06 MED FILL — traMADol HCL 50 MG TABS: 50 | 30 days supply | Qty: 60 | Fill #0

## 2020-09-14 ENCOUNTER — Other Ambulatory Visit (HOSPITAL_COMMUNITY): Payer: Self-pay | Admitting: Emergency Medicine

## 2020-09-14 ENCOUNTER — Ambulatory Visit (HOSPITAL_COMMUNITY)
Admission: EM | Admit: 2020-09-14 | Discharge: 2020-09-14 | Disposition: A | Discharge status: Home / Self Care | Payer: 59 | Attending: Emergency Medicine | Admitting: Emergency Medicine

## 2020-09-14 ENCOUNTER — Encounter (HOSPITAL_COMMUNITY): Payer: Self-pay | Admitting: Emergency Medicine

## 2020-09-14 ENCOUNTER — Other Ambulatory Visit: Payer: Self-pay

## 2020-09-14 DIAGNOSIS — J0100 Acute maxillary sinusitis, unspecified: Secondary | ICD-10-CM | POA: Diagnosis not present

## 2020-09-14 MED ORDER — AMOXICILLIN 875 MG PO TABS: 875.0000 mg | ORAL_TABLET | Freq: Two times a day (BID) | ORAL | 0 refills | Status: DC

## 2020-09-14 MED FILL — NEBIVOLOL HCL 2.5 MG TABS: 2.5 | 90 days supply | Qty: 90 | Fill #1

## 2020-09-14 MED FILL — AMOXICILLIN 875 MG TABS: 875 | 7 days supply | Qty: 14 | Fill #0

## 2020-09-14 NOTE — ED Triage Notes (Signed)
PT reports cough, headache, and nausea for over a week. She tested negative for covid a "few days" after symptom onset.

## 2020-09-14 NOTE — ED Provider Notes (Signed)
[place]    CSN: [id number] Arrival date & time: 09/14/20  0805      History   Chief Complaint Chief Complaint  Patient presents with   Cough    HPI Monique Clark is a 54 y.o. female.   Patient presents with 2-week history of cough productive of yellow phlegm, sinus congestion, sinus pressure, postnasal drip, sinus headache, nausea.  She denies fever, chills, difficulty breathing, vomiting, diarrhea, or other symptoms.  Treatment attempted at home with Mucinex.  Her medical history includes non-Hodgkin's lymphoma, common variable immunodeficiency, hypertension, atrial fibrillation, POTS, prediabetes, diverticulosis, arthritis, and obesity.  The history is provided by the patient.    Past Medical History:  Diagnosis Date   Abscess    Arthritis    Atrial fibrillation ([place])    Cardiac arrhythmia due to congenital heart disease    CVID (common variable immunodeficiency) ([place]) 2015   Diverticulosis    Elevated cholesterol    High blood pressure    Non Hodgkin's lymphoma ([facility])    Obesity    Pneumonia    POTS (postural orthostatic tachycardia syndrome)    UTI (urinary tract infection)     Patient Active Problem List   Diagnosis Date Noted   Elevated serum hCG 07/02/2020   Paroxysmal atrial fibrillation (H[facility])    Localized swelling of left lower extremity    Chest pain 03/13/2019   Lightheadedness    Essential hypertension    History of left hip replacement 07/29/2018   BMI 45.0-49.9, adult ([place]) 12/28/2015   Benign hypertension 12/12/2015   CVID (common variable immunodeficiency) ([place]) 05/07/2015   Pneumonia 05/01/2015   Post-nasal discharge 03/30/2013   Prediabetes 01/20/2013   S/P splenectomy 01/20/2013   History of non-Hodgkin's lymphoma 12/21/2012   Low back pain radiating to right lower extremity 12/10/2012   Right lumbar radiculitis 12/10/2012    Past Surgical History:  Procedure Laterality Date   ABDOMINAL  HYSTERECTOMY     APPENDECTOMY     CESAREAN SECTION     GALLBLADDER SURGERY     JOINT REPLACEMENT Left    hip   left shoulder repair     LUNG REMOVAL, PARTIAL     myringostomy Bilateral    SPLENECTOMY, TOTAL     TONSILLECTOMY AND ADENOIDECTOMY      OB History    Gravida  3   Para  3   Term  3   Preterm      AB      Living  3     SAB      IAB      Ectopic      Multiple      Live Births               Home Medications    Prior to Admission medications   Medication Sig Start Date End Date Taking? Authorizing Provider  amoxicillin (AMOXIL) 875 MG tablet Take 1 tablet (875 mg total) by mouth 2 (two) times daily for 7 days. 09/14/20 09/21/20  Sharion Balloon, NP  aspirin 325 MG tablet Take 325 mg by mouth daily.  07/25/14   [provider]  Cholecalciferol (VITAMIN D3) 1.25 MG (50000 UT) CAPS Take 1 capsule by mouth See admin instructions. Take one capsule (50,000 unit) by mouth twice weekly - Mondays and Thursdays 02/15/20   [provider]  COENZYME Q10 PO Take 1 capsule by mouth daily.     [provider]  diphenhydrAMINE (BENADRYL) 25 MG tablet Take  50 mg by mouth See admin instructions. Take 2 tablets (50 mg) by mouth at bedtime as needed for sleep, and take 2 tablets (50 mg) with each dose of Hizentra    [provider]  DULoxetine (CYMBALTA) 30 MG capsule Take 3,060 mg by mouth daily.  08/14/17   [provider]  famotidine (PEPCID) 20 MG tablet Take 20 mg by mouth daily.  07/25/14   [provider]  ibuprofen (ADVIL) 200 MG tablet Take 800 mg by mouth 3 (three) times daily.    [provider]  Immune Globulin, Human, (HIZENTRA) 4 GM/20ML SOLN Inject 36 g into the skin every 14 (fourteen) days. Monday 05/03/15   [provider]  methocarbamol (ROBAXIN) 750 MG tablet Take 1 tablet (750 mg total) by mouth every 6 (six) hours as needed for muscle spasms. 09/06/20   Hilts, Legrand Como, MD   mirabegron ER (MYRBETRIQ) 50 MG TB24 tablet Take 50 mg by mouth daily.     [provider]  nebivolol (BYSTOLIC) 2.5 MG tablet Take 1 tablet (2.5 mg total) by mouth 2 (two) times a day. Patient taking differently: Take 2.5 mg by mouth daily.  03/16/19   Mullis, Kiersten P, DO  pramipexole (MIRAPEX) 0.25 MG tablet Take 0.25 mg by mouth at bedtime.    [provider]  rosuvastatin (CRESTOR) 10 MG tablet Take 1 tablet (10 mg total) by mouth daily at 6 PM. Patient taking differently: Take 10 mg by mouth daily.  03/16/19   Mullis, Kiersten P, DO  tiZANidine (ZANAFLEX) 4 MG tablet Take 4 mg by mouth at bedtime. 02/29/20   [provider]  traMADol (ULTRAM) 50 MG tablet Take 1-2 tablets (50-100 mg total) by mouth at bedtime as needed. 09/06/20   Hilts, Michael, MD    Family History Family History  Problem Relation Age of Onset   Atrial fibrillation Mother    Cancer Father        Colon   Atrial fibrillation Maternal Uncle    Atrial fibrillation Maternal Grandmother    Atrial fibrillation Maternal Grandfather    Heart attack Maternal Grandfather    Ovarian cancer Neg Hx     Social History Social History   Tobacco Use   Smoking status: Former Smoker    Packs/day: 0.50    Types: Cigarettes    Quit date: 09/29/2002    Years since quitting: 17.9   Smokeless tobacco: Never Used  Scientific laboratory technician Use: Never used  Substance Use Topics   Alcohol use: No   Drug use: No     Allergies   Nizatidine, Promethazine, and Tetanus toxoids   Review of Systems Review of Systems  Constitutional: Negative for chills and fever.  HENT: Positive for congestion, postnasal drip, rhinorrhea and sinus pressure. Negative for ear pain and sore throat.   Eyes: Negative for pain and visual disturbance.  Respiratory: Positive for cough. Negative for shortness of breath.   Cardiovascular: Negative for chest pain and palpitations.  Gastrointestinal: Positive for nausea.  Negative for abdominal pain, diarrhea and vomiting.  Genitourinary: Negative for dysuria and hematuria.  Musculoskeletal: Negative for arthralgias and back pain.  Skin: Negative for color change and rash.  Neurological: Negative for seizures and syncope.  All other systems reviewed and are negative.    Physical Exam Triage Vital Signs ED Triage Vitals  Enc Vitals Group     BP      Pulse      Resp  Temp      Temp src      SpO2      Weight      Height      Head Circumference      Peak Flow      Pain Score      Pain Loc      Pain Edu?      Excl. in [place]?    No data found.  Updated Vital Signs BP (!) 146/78    Pulse 62    Temp 98.2 F (36.8 C) (Oral)    Resp 16    SpO2 100%   Visual Acuity Right Eye Distance:   Left Eye Distance:   Bilateral Distance:    Right Eye Near:   Left Eye Near:    Bilateral Near:     Physical Exam Vitals and nursing note reviewed.  Constitutional:      General: She is not in acute distress.    Appearance: She is well-developed and well-nourished. She is not ill-appearing.  HENT:     Head: Normocephalic and atraumatic.     Right Ear: Tympanic membrane normal.     Left Ear: Tympanic membrane normal.     Nose: Congestion and rhinorrhea present.     Mouth/Throat:     Mouth: Mucous membranes are moist.     Pharynx: Oropharynx is clear.  Eyes:     Conjunctiva/sclera: Conjunctivae normal.  Cardiovascular:     Rate and Rhythm: Normal rate and regular rhythm.     Heart sounds: Normal heart sounds.  Pulmonary:     Effort: Pulmonary effort is normal. No respiratory distress.     Breath sounds: Normal breath sounds. No wheezing or rhonchi.  Abdominal:     Palpations: Abdomen is soft.     Tenderness: There is no abdominal tenderness. There is no guarding or rebound.  Musculoskeletal:        General: No edema.     Cervical back: Neck supple.  Skin:    General: Skin is warm and dry.     Findings: No rash.  Neurological:     General:  No focal deficit present.     Mental Status: She is alert and oriented to person, place, and time.     Gait: Gait normal.  Psychiatric:        Mood and Affect: Mood and affect and mood normal.        Behavior: Behavior normal.      UC Treatments / Results  Labs (all labs ordered are listed, but only abnormal results are displayed) Labs Reviewed - No data to display  EKG   Radiology No results found.  Procedures Procedures (including critical care time)  Medications Ordered in UC Medications - No data to display  Initial Impression / Assessment and Plan / UC Course  I have reviewed the triage vital signs and the nursing notes.  Pertinent labs & imaging results that were available during my care of the patient were reviewed by me and considered in my medical decision making (see chart for details).   Acute sinusitis.  Patient declines COVID test today; she had a negative COVID last week.  Treating with amoxicillin.  Encouraged her to continue Mucinex.  Instructed her to follow-up with her PCP if her symptoms are not improving.  Patient agrees to plan of care.   Final Clinical Impressions(s) / UC Diagnoses   Final diagnoses:  Acute non-recurrent maxillary sinusitis     Discharge Instructions  Take the amoxicillin as directed.    Follow up with your primary care provider if your symptoms are not improving.       ED Prescriptions    Medication Sig Dispense Auth. Provider   amoxicillin (AMOXIL) 875 MG tablet Take 1 tablet (875 mg total) by mouth 2 (two) times daily for 7 days. 14 tablet Sharion Balloon, NP     PDMP not reviewed this encounter.   Sharion Balloon, NP 09/14/20 0900

## 2020-09-14 NOTE — Discharge Instructions (Signed)
Take the amoxicillin as directed.  Follow up with your primary care provider if your symptoms are not improving.   ° ° °

## 2020-09-16 DIAGNOSIS — D801 Nonfamilial hypogammaglobulinemia: Secondary | ICD-10-CM | POA: Diagnosis not present

## 2020-09-18 DIAGNOSIS — Z01818 Encounter for other preprocedural examination: Secondary | ICD-10-CM | POA: Diagnosis not present

## 2020-09-18 DIAGNOSIS — I4891 Unspecified atrial fibrillation: Secondary | ICD-10-CM | POA: Diagnosis not present

## 2020-09-18 DIAGNOSIS — I340 Nonrheumatic mitral (valve) insufficiency: Secondary | ICD-10-CM | POA: Diagnosis not present

## 2020-09-18 DIAGNOSIS — Z01810 Encounter for preprocedural cardiovascular examination: Secondary | ICD-10-CM | POA: Diagnosis not present

## 2020-09-18 DIAGNOSIS — I739 Peripheral vascular disease, unspecified: Secondary | ICD-10-CM | POA: Diagnosis not present

## 2020-09-18 DIAGNOSIS — I10 Essential (primary) hypertension: Secondary | ICD-10-CM | POA: Diagnosis not present

## 2020-09-19 ENCOUNTER — Ambulatory Visit: Payer: Self-pay

## 2020-09-19 ENCOUNTER — Encounter: Payer: Self-pay | Admitting: Physical Medicine and Rehabilitation

## 2020-09-19 ENCOUNTER — Ambulatory Visit (INDEPENDENT_AMBULATORY_CARE_PROVIDER_SITE_OTHER): Payer: 59 | Admitting: Physical Medicine and Rehabilitation

## 2020-09-19 ENCOUNTER — Ambulatory Visit: Payer: 59 | Admitting: Physical Medicine and Rehabilitation

## 2020-09-19 ENCOUNTER — Other Ambulatory Visit: Payer: Self-pay

## 2020-09-19 DIAGNOSIS — M25551 Pain in right hip: Secondary | ICD-10-CM

## 2020-09-19 MED ORDER — TRIAMCINOLONE ACETONIDE 40 MG/ML IJ SUSP
60.0000 mg | INTRAMUSCULAR | Status: AC | PRN
  Administered 2020-09-19: 60 mg via INTRA_ARTICULAR

## 2020-09-19 MED ORDER — BUPIVACAINE HCL 0.25 % IJ SOLN
4.0000 mL | INTRAMUSCULAR | Status: AC | PRN
  Administered 2020-09-19: 4 mL via INTRA_ARTICULAR

## 2020-09-19 NOTE — Progress Notes (Signed)
Monique Clark - 54 y.o. female MRN [id number]  Date of birth: 10/04/1965  Office Visit Note: Visit Date: 09/19/2020 PCP: Lonia Mad, MD Referred by: Lonia Mad, MD  Subjective: Chief Complaint  Patient presents with  . Right Hip - Pain   HPI:  Monique Clark is a 54 y.o. female who comes in today for planned repeat Right anesthetic hip arthrogram with fluoroscopic guidance.  The patient has failed conservative care including home exercise, medications, time and activity modification. Prior injection gave more than 50% relief for several months. This injection will be diagnostic and hopefully therapeutic.  Please see requesting physician notes for further details and justification.  Referring: Dr. Legrand Como Hilts   ROS Otherwise per HPI.  Assessment & Plan: Visit Diagnoses:    ICD-10-CM   1. Pain in right hip  M25.551 XR C-ARM NO REPORT    Large Joint Inj: R hip joint    Plan: No additional findings.   Meds & Orders: No orders of the defined types were placed in this encounter.   Orders Placed This Encounter  Procedures  . Large Joint Inj: R hip joint  . XR C-ARM NO REPORT    Follow-up: No follow-ups on file.   Procedures: Large Joint Inj: R hip joint on 09/19/2020 3:23 PM Indications: diagnostic evaluation and pain Details: 22 G 3.5 in needle, fluoroscopy-guided anterior approach  Arthrogram: No  Medications: 4 mL bupivacaine 0.25 %; 60 mg triamcinolone acetonide 40 MG/ML Outcome: tolerated well, no immediate complications  There was excellent flow of contrast producing a partial arthrogram of the hip. The patient did have relief of symptoms during the anesthetic phase of the injection. Procedure, treatment alternatives, risks and benefits explained, specific risks discussed. Consent was given by the patient. Immediately prior to procedure a time out was called to verify the correct patient, procedure, equipment, support staff and site/side marked as  required. Patient was prepped and draped in the usual sterile fashion.          Clinical History: Right hip pain for approximately 2 months. No known injury.  EXAM: MR OF THE RIGHT HIP WITHOUT CONTRAST  TECHNIQUE: Multiplanar, multisequence MR imaging was performed. No intravenous contrast was administered.  COMPARISON:  None.  FINDINGS: Bones: No fracture, stress change or worrisome lesion is identified. There is subchondral cyst formation and edema in the right acetabulum. Osteophytosis about the right femoral head is seen. Artifact from a left hip arthroplasty is noted.  Articular cartilage and labrum  Articular cartilage: Diffusely degenerated with associated joint space narrowing.  Labrum: The anterior and superior labrum are severely degenerated and torn. Associated septated paralabral cyst off the anterior labrum measures 2.4 cm transverse x 0.6 cm AP x 3.1 cm craniocaudal.  Joint or bursal effusion  Joint effusion:  None.  Bursae: Negative.  Muscles and tendons  Muscles and tendons:  Appear intact.  Other findings  Miscellaneous: Imaged intrapelvic contents demonstrate some sigmoid diverticulosis. The patient is status post hysterectomy.  IMPRESSION: Markedly advanced for age right hip osteoarthritis with associated severe tearing of the anterior and superior labrum.  Diverticulosis.   Electronically Signed   By: Inge Rise M.D.   On: 05/20/2020 11:59 ---- MRI LUMBAR SPINE WITHOUT CONTRAST    TECHNIQUE:  Multiplanar, multisequence MR imaging of the lumbar spine was  performed. No intravenous contrast was administered.    COMPARISON: Radiography 01/30/2020    FINDINGS:  Segmentation: 5 lumbar type vertebral bodies.    Alignment: 1 mm degenerative anterolisthesis  L4-5.    Vertebrae: No fracture or primary bone lesion of significance. Few  scattered benign appearing hemangiomas.    Conus medullaris and  cauda equina: Conus extends to the L1-2 level.  Conus and cauda equina appear normal.    Paraspinal and other soft tissues: Negative    Disc levels:    No significant disc or disc space finding at L1-2 or above.    L2-3: Mild desiccation and bulging of the disc. Mild bilateral facet  osteoarthritis. No compressive stenosis.    L3-4: Moderate bulging of the disc. Mild facet and ligamentous  hypertrophy. Narrowing of the lateral recesses left more than right.  Mild facet edema which could be associated with back pain or  referred facet syndrome pain.    L4-5: Moderate bulging of the disc. Bilateral facet osteoarthritis  allowing 1 mm of anterolisthesis. Mild stenosis of both lateral  recesses. Mild facet edema which could be associated with back pain  or referred facet syndrome pain.    L5-S1: Shallow disc protrusion in the left posterolateral direction.  Facet and ligamentous hypertrophy. Narrowing of both subarticular  lateral recesses left more than right. Some potential to affect  either S1 nerve, particularly the left. Mild facet edema could be  associated with back pain or referred facet syndrome pain.    IMPRESSION:  Degenerative disc disease and degenerative facet disease in the  lumbar spine from L2-3 through L5-S1. Throughout the region, the  facet arthropathy could be associated with back pain or referred  facet syndrome pain, joints showing most pronounced involvement at  L3-4 and L4-5. Disc bulges throughout the region contribute to  lateral recess stenosis that could potentially be symptomatic,  particularly at the L4-5 and L5-S1 levels.      Electronically Signed  By: Nelson Chimes M.D.  On: 02/13/2020 08:01     Objective:  VS:  HT:    WT:   BMI:     BP:   HR: bpm  TEMP: ( )  RESP:  Physical Exam Constitutional:      General: She is not in acute distress.    Appearance: Normal appearance. She is obese. She is not ill-appearing.  HENT:      Head: Normocephalic and atraumatic.     Right Ear: External ear normal.     Left Ear: External ear normal.  Eyes:     Extraocular Movements: Extraocular movements intact.  Cardiovascular:     Rate and Rhythm: Normal rate.     Pulses: Normal pulses.  Musculoskeletal:     Right lower leg: No edema.     Left lower leg: No edema.     Comments: Patient has good distal strength but ambulated with right antalgic gait.  No clonus or focal weakness.  Skin:    Findings: No erythema, lesion or rash.  Neurological:     General: No focal deficit present.     Mental Status: She is alert and oriented to person, place, and time.     Sensory: No sensory deficit.     Motor: No weakness or abnormal muscle tone.     Coordination: Coordination normal.  Psychiatric:        Mood and Affect: Mood normal.        Behavior: Behavior normal.      Imaging: No results found.

## 2020-09-19 NOTE — Progress Notes (Signed)
Pt state right hip pain. Pt state walking, standing and laying down makes the pain worse. Pt state pain meds and laying down can help ease the pain.  Numeric Pain Rating Scale and Functional Assessment Average Pain 5   In the last MONTH (on 0-10 scale) has pain interfered with the following?  1. General activity like being  able to carry out your everyday physical activities such as walking, climbing stairs, carrying groceries, or moving a chair?  Rating(10)

## 2020-09-29 DIAGNOSIS — D801 Nonfamilial hypogammaglobulinemia: Secondary | ICD-10-CM | POA: Diagnosis not present

## 2020-10-03 DIAGNOSIS — R5383 Other fatigue: Secondary | ICD-10-CM | POA: Diagnosis not present

## 2020-10-03 DIAGNOSIS — I10 Essential (primary) hypertension: Secondary | ICD-10-CM | POA: Diagnosis not present

## 2020-10-03 DIAGNOSIS — E782 Mixed hyperlipidemia: Secondary | ICD-10-CM | POA: Diagnosis not present

## 2020-10-07 DIAGNOSIS — C8590 Non-Hodgkin lymphoma, unspecified, unspecified site: Secondary | ICD-10-CM | POA: Diagnosis not present

## 2020-10-07 DIAGNOSIS — Z7901 Long term (current) use of anticoagulants: Secondary | ICD-10-CM | POA: Diagnosis not present

## 2020-10-07 DIAGNOSIS — I482 Chronic atrial fibrillation, unspecified: Secondary | ICD-10-CM | POA: Diagnosis not present

## 2020-10-07 DIAGNOSIS — I480 Paroxysmal atrial fibrillation: Secondary | ICD-10-CM | POA: Diagnosis not present

## 2020-10-07 DIAGNOSIS — Z888 Allergy status to other drugs, medicaments and biological substances status: Secondary | ICD-10-CM | POA: Diagnosis not present

## 2020-10-07 DIAGNOSIS — Z8249 Family history of ischemic heart disease and other diseases of the circulatory system: Secondary | ICD-10-CM | POA: Diagnosis not present

## 2020-10-07 DIAGNOSIS — Z8616 Personal history of COVID-19: Secondary | ICD-10-CM | POA: Diagnosis not present

## 2020-10-07 DIAGNOSIS — I10 Essential (primary) hypertension: Secondary | ICD-10-CM | POA: Diagnosis not present

## 2020-10-12 ENCOUNTER — Encounter: Payer: Self-pay | Admitting: Family Medicine

## 2020-10-12 ENCOUNTER — Other Ambulatory Visit: Payer: Self-pay | Admitting: Family Medicine

## 2020-10-12 MED ORDER — TRAMADOL HCL 50 MG PO TABS
50.0000 mg | ORAL_TABLET | Freq: Every evening | ORAL | 0 refills | Status: DC | PRN
Start: 2020-10-12 — End: 2020-10-12

## 2020-10-12 MED FILL — traMADol HCL 50 MG TABS: 50 | 30 days supply | Qty: 60 | Fill #0

## 2020-10-17 ENCOUNTER — Other Ambulatory Visit (HOSPITAL_COMMUNITY): Payer: Self-pay | Admitting: Family Medicine

## 2020-10-17 DIAGNOSIS — I340 Nonrheumatic mitral (valve) insufficiency: Secondary | ICD-10-CM | POA: Diagnosis not present

## 2020-10-17 DIAGNOSIS — I10 Essential (primary) hypertension: Secondary | ICD-10-CM | POA: Diagnosis not present

## 2020-10-17 DIAGNOSIS — I4891 Unspecified atrial fibrillation: Secondary | ICD-10-CM | POA: Diagnosis not present

## 2020-10-17 DIAGNOSIS — Z01818 Encounter for other preprocedural examination: Secondary | ICD-10-CM | POA: Diagnosis not present

## 2020-10-24 DIAGNOSIS — M1611 Unilateral primary osteoarthritis, right hip: Secondary | ICD-10-CM | POA: Diagnosis not present

## 2020-10-24 DIAGNOSIS — Z20822 Contact with and (suspected) exposure to covid-19: Secondary | ICD-10-CM | POA: Diagnosis not present

## 2020-10-24 DIAGNOSIS — D839 Common variable immunodeficiency, unspecified: Secondary | ICD-10-CM | POA: Diagnosis not present

## 2020-10-24 DIAGNOSIS — Z96641 Presence of right artificial hip joint: Secondary | ICD-10-CM | POA: Diagnosis not present

## 2020-10-24 DIAGNOSIS — E785 Hyperlipidemia, unspecified: Secondary | ICD-10-CM | POA: Diagnosis not present

## 2020-10-24 DIAGNOSIS — I10 Essential (primary) hypertension: Secondary | ICD-10-CM | POA: Diagnosis not present

## 2020-10-24 DIAGNOSIS — Z87891 Personal history of nicotine dependence: Secondary | ICD-10-CM | POA: Diagnosis not present

## 2020-10-24 DIAGNOSIS — Z96642 Presence of left artificial hip joint: Secondary | ICD-10-CM | POA: Diagnosis not present

## 2020-10-24 DIAGNOSIS — I4891 Unspecified atrial fibrillation: Secondary | ICD-10-CM | POA: Diagnosis not present

## 2020-10-24 DIAGNOSIS — Z79899 Other long term (current) drug therapy: Secondary | ICD-10-CM | POA: Diagnosis not present

## 2020-10-24 DIAGNOSIS — Z471 Aftercare following joint replacement surgery: Secondary | ICD-10-CM | POA: Diagnosis not present

## 2020-10-25 ENCOUNTER — Other Ambulatory Visit (HOSPITAL_COMMUNITY): Payer: Self-pay | Admitting: Cardiovascular Disease

## 2020-10-25 DIAGNOSIS — D839 Common variable immunodeficiency, unspecified: Secondary | ICD-10-CM | POA: Diagnosis not present

## 2020-10-25 DIAGNOSIS — Z79899 Other long term (current) drug therapy: Secondary | ICD-10-CM | POA: Diagnosis not present

## 2020-10-25 DIAGNOSIS — I10 Essential (primary) hypertension: Secondary | ICD-10-CM | POA: Diagnosis not present

## 2020-10-25 DIAGNOSIS — Z96642 Presence of left artificial hip joint: Secondary | ICD-10-CM | POA: Diagnosis not present

## 2020-10-25 DIAGNOSIS — Z87891 Personal history of nicotine dependence: Secondary | ICD-10-CM | POA: Diagnosis not present

## 2020-10-25 DIAGNOSIS — E785 Hyperlipidemia, unspecified: Secondary | ICD-10-CM | POA: Diagnosis not present

## 2020-10-25 DIAGNOSIS — I4891 Unspecified atrial fibrillation: Secondary | ICD-10-CM | POA: Diagnosis not present

## 2020-10-25 DIAGNOSIS — M1611 Unilateral primary osteoarthritis, right hip: Secondary | ICD-10-CM | POA: Diagnosis not present

## 2020-10-25 DIAGNOSIS — Z20822 Contact with and (suspected) exposure to covid-19: Secondary | ICD-10-CM | POA: Diagnosis not present

## 2020-10-26 DIAGNOSIS — M1611 Unilateral primary osteoarthritis, right hip: Secondary | ICD-10-CM | POA: Diagnosis not present

## 2020-10-26 DIAGNOSIS — Z79899 Other long term (current) drug therapy: Secondary | ICD-10-CM | POA: Diagnosis not present

## 2020-10-26 DIAGNOSIS — D839 Common variable immunodeficiency, unspecified: Secondary | ICD-10-CM | POA: Diagnosis not present

## 2020-10-26 DIAGNOSIS — E785 Hyperlipidemia, unspecified: Secondary | ICD-10-CM | POA: Diagnosis not present

## 2020-10-26 DIAGNOSIS — Z96642 Presence of left artificial hip joint: Secondary | ICD-10-CM | POA: Diagnosis not present

## 2020-10-26 DIAGNOSIS — Z20822 Contact with and (suspected) exposure to covid-19: Secondary | ICD-10-CM | POA: Diagnosis not present

## 2020-10-26 DIAGNOSIS — I10 Essential (primary) hypertension: Secondary | ICD-10-CM | POA: Diagnosis not present

## 2020-10-26 DIAGNOSIS — Z87891 Personal history of nicotine dependence: Secondary | ICD-10-CM | POA: Diagnosis not present

## 2020-10-26 DIAGNOSIS — I4891 Unspecified atrial fibrillation: Secondary | ICD-10-CM | POA: Diagnosis not present

## 2020-10-28 DIAGNOSIS — D801 Nonfamilial hypogammaglobulinemia: Secondary | ICD-10-CM | POA: Diagnosis not present

## 2020-10-29 ENCOUNTER — Other Ambulatory Visit (HOSPITAL_COMMUNITY): Payer: Self-pay | Admitting: Cardiovascular Disease

## 2020-10-29 MED FILL — XARELTO 20 MG TABLET: 20 | 90 days supply | Qty: 90 | Fill #0

## 2020-10-30 DIAGNOSIS — Z96641 Presence of right artificial hip joint: Secondary | ICD-10-CM | POA: Diagnosis not present

## 2020-10-30 DIAGNOSIS — Z471 Aftercare following joint replacement surgery: Secondary | ICD-10-CM | POA: Diagnosis not present

## 2020-10-30 DIAGNOSIS — Z6841 Body Mass Index (BMI) 40.0 and over, adult: Secondary | ICD-10-CM | POA: Diagnosis not present

## 2020-10-30 DIAGNOSIS — I10 Essential (primary) hypertension: Secondary | ICD-10-CM | POA: Diagnosis not present

## 2020-11-01 DIAGNOSIS — Z6841 Body Mass Index (BMI) 40.0 and over, adult: Secondary | ICD-10-CM | POA: Diagnosis not present

## 2020-11-01 DIAGNOSIS — Z96641 Presence of right artificial hip joint: Secondary | ICD-10-CM | POA: Diagnosis not present

## 2020-11-01 DIAGNOSIS — Z471 Aftercare following joint replacement surgery: Secondary | ICD-10-CM | POA: Diagnosis not present

## 2020-11-01 DIAGNOSIS — I10 Essential (primary) hypertension: Secondary | ICD-10-CM | POA: Diagnosis not present

## 2020-11-07 DIAGNOSIS — I10 Essential (primary) hypertension: Secondary | ICD-10-CM | POA: Diagnosis not present

## 2020-11-07 DIAGNOSIS — Z471 Aftercare following joint replacement surgery: Secondary | ICD-10-CM | POA: Diagnosis not present

## 2020-11-07 DIAGNOSIS — Z6841 Body Mass Index (BMI) 40.0 and over, adult: Secondary | ICD-10-CM | POA: Diagnosis not present

## 2020-11-07 DIAGNOSIS — Z96641 Presence of right artificial hip joint: Secondary | ICD-10-CM | POA: Diagnosis not present

## 2020-11-11 DIAGNOSIS — I10 Essential (primary) hypertension: Secondary | ICD-10-CM | POA: Diagnosis not present

## 2020-11-11 DIAGNOSIS — Z6841 Body Mass Index (BMI) 40.0 and over, adult: Secondary | ICD-10-CM | POA: Diagnosis not present

## 2020-11-11 DIAGNOSIS — M25551 Pain in right hip: Secondary | ICD-10-CM | POA: Diagnosis not present

## 2020-11-12 DIAGNOSIS — Z6841 Body Mass Index (BMI) 40.0 and over, adult: Secondary | ICD-10-CM | POA: Diagnosis not present

## 2020-11-12 DIAGNOSIS — I10 Essential (primary) hypertension: Secondary | ICD-10-CM | POA: Diagnosis not present

## 2020-11-12 DIAGNOSIS — Z471 Aftercare following joint replacement surgery: Secondary | ICD-10-CM | POA: Diagnosis not present

## 2020-11-12 DIAGNOSIS — Z96641 Presence of right artificial hip joint: Secondary | ICD-10-CM | POA: Diagnosis not present

## 2020-11-14 DIAGNOSIS — Z6841 Body Mass Index (BMI) 40.0 and over, adult: Secondary | ICD-10-CM | POA: Diagnosis not present

## 2020-11-14 DIAGNOSIS — Z96641 Presence of right artificial hip joint: Secondary | ICD-10-CM | POA: Diagnosis not present

## 2020-11-14 DIAGNOSIS — I10 Essential (primary) hypertension: Secondary | ICD-10-CM | POA: Diagnosis not present

## 2020-11-14 DIAGNOSIS — Z471 Aftercare following joint replacement surgery: Secondary | ICD-10-CM | POA: Diagnosis not present

## 2020-11-19 DIAGNOSIS — Z471 Aftercare following joint replacement surgery: Secondary | ICD-10-CM | POA: Diagnosis not present

## 2020-11-19 DIAGNOSIS — Z6841 Body Mass Index (BMI) 40.0 and over, adult: Secondary | ICD-10-CM | POA: Diagnosis not present

## 2020-11-19 DIAGNOSIS — Z96641 Presence of right artificial hip joint: Secondary | ICD-10-CM | POA: Diagnosis not present

## 2020-11-19 DIAGNOSIS — I10 Essential (primary) hypertension: Secondary | ICD-10-CM | POA: Diagnosis not present

## 2020-11-22 DIAGNOSIS — M47818 Spondylosis without myelopathy or radiculopathy, sacral and sacrococcygeal region: Secondary | ICD-10-CM | POA: Diagnosis not present

## 2020-11-22 DIAGNOSIS — M25551 Pain in right hip: Secondary | ICD-10-CM | POA: Diagnosis not present

## 2020-11-22 DIAGNOSIS — Z96641 Presence of right artificial hip joint: Secondary | ICD-10-CM | POA: Diagnosis not present

## 2020-11-30 MED FILL — METHOCARBAMOL 750 MG TABS: 750 | 30 days supply | Qty: 120 | Fill #1

## 2020-11-30 MED FILL — ROSUVASTATIN CALCIUM 10 MG: 10 | 90 days supply | Qty: 90 | Fill #1

## 2020-11-30 MED FILL — DULoxetine HCL 30 MG CPEP: 30 | 30 days supply | Qty: 60 | Fill #9

## 2020-11-30 MED FILL — VITAMIN D3 50,000 UNITS CAP: 1.25 MG | 84 days supply | Qty: 24 | Fill #1

## 2020-12-02 DIAGNOSIS — D801 Nonfamilial hypogammaglobulinemia: Secondary | ICD-10-CM | POA: Diagnosis not present

## 2020-12-04 MED FILL — FLECAINIDE ACETATE 50 MG TA: 50 | 90 days supply | Qty: 180 | Fill #0

## 2020-12-21 ENCOUNTER — Other Ambulatory Visit (HOSPITAL_BASED_OUTPATIENT_CLINIC_OR_DEPARTMENT_OTHER): Payer: Self-pay

## 2020-12-26 DIAGNOSIS — I4891 Unspecified atrial fibrillation: Secondary | ICD-10-CM | POA: Diagnosis not present

## 2020-12-26 DIAGNOSIS — W1830XA Fall on same level, unspecified, initial encounter: Secondary | ICD-10-CM | POA: Diagnosis not present

## 2020-12-26 DIAGNOSIS — S8991XA Unspecified injury of right lower leg, initial encounter: Secondary | ICD-10-CM | POA: Diagnosis not present

## 2020-12-26 DIAGNOSIS — I10 Essential (primary) hypertension: Secondary | ICD-10-CM | POA: Diagnosis not present

## 2020-12-26 DIAGNOSIS — Z9889 Other specified postprocedural states: Secondary | ICD-10-CM | POA: Diagnosis not present

## 2020-12-26 DIAGNOSIS — Z96643 Presence of artificial hip joint, bilateral: Secondary | ICD-10-CM | POA: Diagnosis not present

## 2020-12-26 DIAGNOSIS — M25551 Pain in right hip: Secondary | ICD-10-CM | POA: Diagnosis not present

## 2020-12-26 DIAGNOSIS — Y929 Unspecified place or not applicable: Secondary | ICD-10-CM | POA: Diagnosis not present

## 2020-12-26 DIAGNOSIS — Z888 Allergy status to other drugs, medicaments and biological substances status: Secondary | ICD-10-CM | POA: Diagnosis not present

## 2020-12-26 DIAGNOSIS — S79911A Unspecified injury of right hip, initial encounter: Secondary | ICD-10-CM | POA: Diagnosis not present

## 2020-12-28 MED FILL — DULoxetine HCL 30 MG CPEP: 30 | 30 days supply | Qty: 60 | Fill #10

## 2020-12-28 MED FILL — NEBIVOLOL HCL 2.5 MG TABS: 2.5 | 90 days supply | Qty: 90 | Fill #2

## 2021-01-07 ENCOUNTER — Other Ambulatory Visit (HOSPITAL_COMMUNITY): Payer: Self-pay

## 2021-01-07 ENCOUNTER — Other Ambulatory Visit: Payer: Self-pay | Admitting: Family Medicine

## 2021-01-07 ENCOUNTER — Encounter: Payer: Self-pay | Admitting: Family Medicine

## 2021-01-07 MED ORDER — PREDNISONE 10 MG PO TABS
ORAL_TABLET | ORAL | 0 refills | Status: DC
  Filled 2021-01-07: qty 42, 12d supply, fill #0

## 2021-01-07 MED ORDER — GABAPENTIN 100 MG PO CAPS
ORAL_CAPSULE | ORAL | 3 refills | Status: DC
  Filled 2021-01-07: qty 90, 30d supply, fill #0
  Filled 2021-02-04: qty 90, 30d supply, fill #1
  Filled 2021-07-01: qty 90, 30d supply, fill #2

## 2021-01-07 MED ORDER — GABAPENTIN 100 MG PO CAPS
ORAL_CAPSULE | ORAL | 3 refills | Status: DC
  Filled 2021-01-07: qty 90, fill #0

## 2021-01-08 ENCOUNTER — Other Ambulatory Visit (HOSPITAL_COMMUNITY): Payer: Self-pay

## 2021-01-15 ENCOUNTER — Other Ambulatory Visit: Payer: Self-pay | Admitting: Internal Medicine

## 2021-01-15 ENCOUNTER — Other Ambulatory Visit (HOSPITAL_COMMUNITY): Payer: Self-pay

## 2021-01-15 ENCOUNTER — Other Ambulatory Visit: Payer: Self-pay

## 2021-01-15 ENCOUNTER — Encounter: Payer: Self-pay | Admitting: Internal Medicine

## 2021-01-15 ENCOUNTER — Ambulatory Visit (INDEPENDENT_AMBULATORY_CARE_PROVIDER_SITE_OTHER): Payer: 59 | Admitting: Internal Medicine

## 2021-01-15 DIAGNOSIS — D839 Common variable immunodeficiency, unspecified: Secondary | ICD-10-CM

## 2021-01-15 DIAGNOSIS — I10 Essential (primary) hypertension: Secondary | ICD-10-CM | POA: Diagnosis not present

## 2021-01-15 DIAGNOSIS — R7303 Prediabetes: Secondary | ICD-10-CM | POA: Diagnosis not present

## 2021-01-15 DIAGNOSIS — Z6841 Body Mass Index (BMI) 40.0 and over, adult: Secondary | ICD-10-CM

## 2021-01-15 MED ORDER — SEMAGLUTIDE-WEIGHT MANAGEMENT 0.5 MG/0.5ML ~~LOC~~ SOAJ
0.5000 mg | SUBCUTANEOUS | 0 refills | Status: DC
  Filled 2021-01-15: qty 2, fill #0

## 2021-01-15 MED ORDER — SEMAGLUTIDE-WEIGHT MANAGEMENT 0.25 MG/0.5ML ~~LOC~~ SOAJ
0.2500 mg | SUBCUTANEOUS | 0 refills | Status: DC
  Filled 2021-01-15: qty 2, 28d supply, fill #0

## 2021-01-15 MED ORDER — WEGOVY 1 MG/0.5ML ~~LOC~~ SOAJ
1.0000 mg | SUBCUTANEOUS | 0 refills | Status: DC
  Filled 2021-01-15: qty 2, fill #0

## 2021-01-15 MED ORDER — DULOXETINE HCL 60 MG PO CPEP
60.0000 mg | ORAL_CAPSULE | Freq: Every day | ORAL | 3 refills | Status: DC
  Filled 2021-01-15: qty 90, 90d supply, fill #0
  Filled 2021-04-09: qty 90, 90d supply, fill #1
  Filled 2021-07-01: qty 90, 90d supply, fill #2
  Filled 2021-10-29: qty 90, 90d supply, fill #3

## 2021-01-15 NOTE — Progress Notes (Signed)
   Subjective:   Patient ID: Monique Clark, female    DOB: Feb 11, 1966, 55 y.o.   MRN: [id number]  HPI The patient is a new 55 YO female coming in for weight management (struggled with weight for some time, does not want to consider surgical procedure at this time, would be willing to try medication, has tried diets previously), and pre-diabetes (has been monitored for this and up to date on labs per her recollection, denies progression to diabetes, she has tried to lose weight unsuccessfully), and CVID (has been followed closely for this and has done better with IVIG with less sinus and URI type infections, overall satisfied with this currently.   PMH, [facility], social history reviewed and updated  Review of Systems  Constitutional: Positive for unexpected weight change.  HENT: Negative.   Eyes: Negative.   Respiratory: Negative for cough, chest tightness and shortness of breath.   Cardiovascular: Negative for chest pain, palpitations and leg swelling.  Gastrointestinal: Negative for abdominal distention, abdominal pain, constipation, diarrhea, nausea and vomiting.  Musculoskeletal: Negative.   Skin: Negative.   Neurological: Negative.   Psychiatric/Behavioral: Negative.     Objective:  Physical Exam Constitutional:      Appearance: She is well-developed. She is obese.  HENT:     Head: Normocephalic and atraumatic.  Cardiovascular:     Rate and Rhythm: Normal rate and regular rhythm.  Pulmonary:     Effort: Pulmonary effort is normal. No respiratory distress.     Breath sounds: Normal breath sounds. No wheezing or rales.  Abdominal:     General: Bowel sounds are normal. There is no distension.     Palpations: Abdomen is soft.     Tenderness: There is no abdominal tenderness. There is no rebound.  Musculoskeletal:     Cervical back: Normal range of motion.  Skin:    General: Skin is warm and dry.  Neurological:     Mental Status: She is alert and oriented to person, place, and  time.     Coordination: Coordination normal.     Vitals:   01/15/21 1414  BP: 132/74  Pulse: 60  Resp: 18  Temp: 98.5 F (36.9 C)  TempSrc: Oral  SpO2: 99%  Weight: (!) 311 lb 9.6 oz (141.3 kg)  Height: 5\' 4"  (1.626 m)    This visit occurred during the SARS-CoV-2 public health emergency.  Safety protocols were in place, including screening questions prior to the visit, additional usage of staff PPE, and extensive cleaning of exam room while observing appropriate contact time as indicated for disinfecting solutions.   Assessment & Plan:

## 2021-01-15 NOTE — Patient Instructions (Addendum)
We will send in the wegovy for weight loss.   Wegovy the first month do 0.25 mg weekly.  The second month do 0.5 mg weekly  The third month do 1 mg weekly then come back and we will check the weight

## 2021-01-16 NOTE — Telephone Encounter (Signed)
Can you find out from patient which other pharmacy she wants [place] to send this to?

## 2021-01-17 ENCOUNTER — Other Ambulatory Visit (HOSPITAL_COMMUNITY): Payer: Self-pay

## 2021-01-17 ENCOUNTER — Encounter: Payer: Self-pay | Admitting: Family Medicine

## 2021-01-17 DIAGNOSIS — M5441 Lumbago with sciatica, right side: Secondary | ICD-10-CM

## 2021-01-17 DIAGNOSIS — Z96641 Presence of right artificial hip joint: Secondary | ICD-10-CM | POA: Diagnosis not present

## 2021-01-17 DIAGNOSIS — M25551 Pain in right hip: Secondary | ICD-10-CM | POA: Diagnosis not present

## 2021-01-17 DIAGNOSIS — M25552 Pain in left hip: Secondary | ICD-10-CM | POA: Diagnosis not present

## 2021-01-18 ENCOUNTER — Other Ambulatory Visit (HOSPITAL_COMMUNITY): Payer: Self-pay

## 2021-01-18 ENCOUNTER — Encounter: Payer: Self-pay | Admitting: Internal Medicine

## 2021-01-18 MED ORDER — DIAZEPAM 5 MG PO TABS
ORAL_TABLET | ORAL | 0 refills | Status: DC
  Filled 2021-01-18: qty 20, 10d supply, fill #0

## 2021-01-18 NOTE — Assessment & Plan Note (Signed)
Controlled on bystolic 

## 2021-01-18 NOTE — Assessment & Plan Note (Signed)
Labs up to date and getting records of this. If unable to afford wegovy could try metformin.

## 2021-01-18 NOTE — Assessment & Plan Note (Signed)
Seeing specialist and getting IVIG for treatment with fewer infections.

## 2021-01-18 NOTE — Telephone Encounter (Signed)
Unable to get in contact with the patient. LDVM asking if there is another pharmacy she would like [place] to send it to. Office number was provided.

## 2021-01-18 NOTE — Assessment & Plan Note (Signed)
Rx wegovy to help with weight management. Follow up 3 months and adjust as needed.

## 2021-01-21 ENCOUNTER — Other Ambulatory Visit (HOSPITAL_COMMUNITY): Payer: Self-pay

## 2021-01-21 MED ORDER — SAXENDA 18 MG/3ML ~~LOC~~ SOPN
PEN_INJECTOR | SUBCUTANEOUS | 0 refills | Status: AC
  Filled 2021-01-21: qty 30, 30d supply, fill #0
  Filled 2021-01-29: qty 15, 30d supply, fill #0
  Filled 2021-07-01: qty 15, 30d supply, fill #1

## 2021-01-23 ENCOUNTER — Other Ambulatory Visit (HOSPITAL_COMMUNITY): Payer: Self-pay

## 2021-01-28 ENCOUNTER — Telehealth: Payer: Self-pay | Admitting: Family Medicine

## 2021-01-28 DIAGNOSIS — M5441 Lumbago with sciatica, right side: Secondary | ICD-10-CM

## 2021-01-28 NOTE — Telephone Encounter (Signed)
Tried calling the patient -- call could not go through. I sent her a message through [place], giving her the number to call them directly [phone].

## 2021-01-28 NOTE — Telephone Encounter (Signed)
Patient called about updates if injection. Please call patient at [phone]

## 2021-01-28 NOTE — Telephone Encounter (Signed)
Could you go ahead and send this order through to [facility]? If you want me to call the patient back, I can - let me know.

## 2021-01-28 NOTE — Telephone Encounter (Signed)
Order faxed to [facility]

## 2021-01-28 NOTE — Telephone Encounter (Signed)
I am not seeing a referral for an injection. Is this an ESI elsewhere that she is needing?

## 2021-01-29 ENCOUNTER — Telehealth: Payer: Self-pay | Admitting: Physical Medicine and Rehabilitation

## 2021-01-29 ENCOUNTER — Other Ambulatory Visit (HOSPITAL_COMMUNITY): Payer: Self-pay

## 2021-01-29 MED ORDER — PREDNISONE 10 MG PO TABS
ORAL_TABLET | ORAL | 0 refills | Status: DC
  Filled 2021-01-29: qty 42, 12d supply, fill #0

## 2021-01-29 MED ORDER — TRAMADOL HCL 50 MG PO TABS
50.0000 mg | ORAL_TABLET | Freq: Three times a day (TID) | ORAL | 0 refills | Status: DC | PRN
  Filled 2021-01-29: qty 30, 10d supply, fill #0

## 2021-01-29 NOTE — Telephone Encounter (Signed)
Patient called requesting a call to set an injection appt. Please call patient at [phone]

## 2021-01-29 NOTE — Addendum Note (Signed)
Addended by: Hortencia Pilar on: 01/29/2021 08:13 AM   Modules accepted: Orders

## 2021-01-30 NOTE — Telephone Encounter (Signed)
Called pt and LVM #1 

## 2021-02-01 DIAGNOSIS — M79604 Pain in right leg: Secondary | ICD-10-CM | POA: Diagnosis not present

## 2021-02-01 DIAGNOSIS — I10 Essential (primary) hypertension: Secondary | ICD-10-CM | POA: Diagnosis not present

## 2021-02-01 DIAGNOSIS — M5441 Lumbago with sciatica, right side: Secondary | ICD-10-CM | POA: Diagnosis not present

## 2021-02-01 DIAGNOSIS — M5431 Sciatica, right side: Secondary | ICD-10-CM | POA: Diagnosis not present

## 2021-02-01 DIAGNOSIS — M79661 Pain in right lower leg: Secondary | ICD-10-CM | POA: Diagnosis not present

## 2021-02-03 DIAGNOSIS — D801 Nonfamilial hypogammaglobulinemia: Secondary | ICD-10-CM | POA: Diagnosis not present

## 2021-02-04 ENCOUNTER — Other Ambulatory Visit (HOSPITAL_COMMUNITY): Payer: Self-pay

## 2021-02-04 MED FILL — Rivaroxaban Tab 20 MG: ORAL | 90 days supply | Qty: 90 | Fill #0 | Status: AC

## 2021-02-11 DIAGNOSIS — I10 Essential (primary) hypertension: Secondary | ICD-10-CM | POA: Diagnosis not present

## 2021-02-11 DIAGNOSIS — Z79899 Other long term (current) drug therapy: Secondary | ICD-10-CM | POA: Diagnosis not present

## 2021-02-11 DIAGNOSIS — D801 Nonfamilial hypogammaglobulinemia: Secondary | ICD-10-CM | POA: Diagnosis not present

## 2021-02-11 DIAGNOSIS — Z9889 Other specified postprocedural states: Secondary | ICD-10-CM | POA: Diagnosis not present

## 2021-02-11 DIAGNOSIS — Z887 Allergy status to serum and vaccine status: Secondary | ICD-10-CM | POA: Diagnosis not present

## 2021-02-11 DIAGNOSIS — Z888 Allergy status to other drugs, medicaments and biological substances status: Secondary | ICD-10-CM | POA: Diagnosis not present

## 2021-02-11 DIAGNOSIS — Z96642 Presence of left artificial hip joint: Secondary | ICD-10-CM | POA: Diagnosis not present

## 2021-02-11 DIAGNOSIS — Z7982 Long term (current) use of aspirin: Secondary | ICD-10-CM | POA: Diagnosis not present

## 2021-02-11 DIAGNOSIS — R300 Dysuria: Secondary | ICD-10-CM | POA: Diagnosis not present

## 2021-02-12 ENCOUNTER — Other Ambulatory Visit (HOSPITAL_COMMUNITY): Payer: Self-pay

## 2021-02-14 ENCOUNTER — Telehealth: Payer: Self-pay

## 2021-02-14 ENCOUNTER — Ambulatory Visit: Payer: Self-pay

## 2021-02-14 ENCOUNTER — Other Ambulatory Visit: Payer: Self-pay

## 2021-02-14 ENCOUNTER — Ambulatory Visit (INDEPENDENT_AMBULATORY_CARE_PROVIDER_SITE_OTHER): Payer: 59 | Admitting: Physical Medicine and Rehabilitation

## 2021-02-14 ENCOUNTER — Encounter: Payer: Self-pay | Admitting: Physical Medicine and Rehabilitation

## 2021-02-14 VITALS — BP 142/92 | HR 69

## 2021-02-14 DIAGNOSIS — M5416 Radiculopathy, lumbar region: Secondary | ICD-10-CM

## 2021-02-14 MED ORDER — METHYLPREDNISOLONE ACETATE 80 MG/ML IJ SUSP
80.0000 mg | Freq: Once | INTRAMUSCULAR | Status: AC
  Administered 2021-02-14: 80 mg

## 2021-02-14 NOTE — Progress Notes (Signed)
Monique Clark - 55 y.o. female MRN [id number]  Date of birth: [birth year]  Office Visit Note: Visit Date: 02/14/2021 PCP: Hoyt Koch, MD Referred by: Hoyt Koch, *  Subjective: Chief Complaint  Patient presents with  . Lower Back - Pain  . Right Leg - Pain   HPI:  Monique Clark is a 55 y.o. female who comes in today at the request of Dr. [name] Blase for planned Right S1-2 Lumbar Transforaminal epidural steroid injection with fluoroscopic guidance.  The patient has failed conservative care including home exercise, medications, time and activity modification.  This injection will be diagnostic and hopefully therapeutic.  Please see requesting physician notes for further details and justification.   ROS Otherwise per HPI.  Assessment & Plan: Visit Diagnoses:    ICD-10-CM   1. Lumbar radiculopathy  M54.16 XR C-ARM NO REPORT    Epidural Steroid injection    methylPREDNISolone acetate (DEPO-MEDROL) injection 80 mg    Plan: No additional findings.   Meds & Orders:  Meds ordered this encounter  Medications  . methylPREDNISolone acetate (DEPO-MEDROL) injection 80 mg    Orders Placed This Encounter  Procedures  . XR C-ARM NO REPORT  . Epidural Steroid injection    Follow-up: Return if symptoms worsen or fail to improve.   Procedures: No procedures performed  S1 Lumbosacral Transforaminal Epidural Steroid Injection - Sub-Pedicular Approach with Fluoroscopic Guidance   Patient: Monique Clark      Date of Birth: 10-31-1965 MRN: [id number] PCP: Hoyt Koch, MD      Visit Date: 02/14/2021   Universal Protocol:    Date/Time: 05/19/228:22 AM  Consent Given By: the patient  Position:  PRONE  Additional Comments: Vital signs were monitored before and after the procedure. Patient was prepped and draped in the usual sterile fashion. The correct patient, procedure, and site was verified.   Injection Procedure Details:  Procedure Site  One Meds Administered:  Meds ordered this encounter  Medications  . methylPREDNISolone acetate (DEPO-MEDROL) injection 80 mg    Laterality: Right  Location/Site:  S1 Foramen   Needle size: 22 ga.  Needle type: Spinal  Needle Placement: Transforaminal  Findings:   -Comments: Excellent flow of contrast along the nerve, nerve root and into the epidural space.  Epidurogram: Contrast epidurogram showed no nerve root cut off or restricted flow pattern.  Procedure Details: After squaring off the sacral end-plate to get a true AP view, the C-arm was positioned so that the best possible view of the S1 foramen was visualized. The soft tissues overlying this structure were infiltrated with 2-3 ml. of 1% Lidocaine without Epinephrine.    The spinal needle was inserted toward the target using a "trajectory" view along the fluoroscope beam.  Under AP and lateral visualization, the needle was advanced so it did not puncture dura. Biplanar projections were used to confirm position. Aspiration was confirmed to be negative for CSF and/or blood. A 1-2 ml. volume of Isovue-250 was injected and flow of contrast was noted at each level. Radiographs were obtained for documentation purposes.   After attaining the desired flow of contrast documented above, a 0.5 to 1.0 ml test dose of 0.25% Marcaine was injected into each respective transforaminal space.  The patient was observed for 90 seconds post injection.  After no sensory deficits were reported, and normal lower extremity motor function was noted,   the above injectate was administered so that equal amounts of the injectate were placed at each foramen (level) into  the transforaminal epidural space.   Additional Comments:  The patient tolerated the procedure well Dressing: Band-Aid with 2 x 2 sterile gauze    Post-procedure details: Patient was observed during the procedure. Post-procedure instructions were reviewed.  Patient left the clinic in  stable condition.     Clinical History: Right hip pain for approximately 2 months. No known injury.  EXAM: MR OF THE RIGHT HIP WITHOUT CONTRAST  TECHNIQUE: Multiplanar, multisequence MR imaging was performed. No intravenous contrast was administered.  COMPARISON:  None.  FINDINGS: Bones: No fracture, stress change or worrisome lesion is identified. There is subchondral cyst formation and edema in the right acetabulum. Osteophytosis about the right femoral head is seen. Artifact from a left hip arthroplasty is noted.  Articular cartilage and labrum  Articular cartilage: Diffusely degenerated with associated joint space narrowing.  Labrum: The anterior and superior labrum are severely degenerated and torn. Associated septated paralabral cyst off the anterior labrum measures 2.4 cm transverse x 0.6 cm AP x 3.1 cm craniocaudal.  Joint or bursal effusion  Joint effusion:  None.  Bursae: Negative.  Muscles and tendons  Muscles and tendons:  Appear intact.  Other findings  Miscellaneous: Imaged intrapelvic contents demonstrate some sigmoid diverticulosis. The patient is status post hysterectomy.  IMPRESSION: Markedly advanced for age right hip osteoarthritis with associated severe tearing of the anterior and superior labrum.  Diverticulosis.   Electronically Signed   By: Inge Rise M.D.   On: 05/20/2020 11:59 ---- MRI LUMBAR SPINE WITHOUT CONTRAST    TECHNIQUE:  Multiplanar, multisequence MR imaging of the lumbar spine was  performed. No intravenous contrast was administered.    COMPARISON: Radiography 01/30/2020    FINDINGS:  Segmentation: 5 lumbar type vertebral bodies.    Alignment: 1 mm degenerative anterolisthesis L4-5.    Vertebrae: No fracture or primary bone lesion of significance. Few  scattered benign appearing hemangiomas.    Conus medullaris and cauda equina: Conus extends to the L1-2 level.  Conus and  cauda equina appear normal.    Paraspinal and other soft tissues: Negative    Disc levels:    No significant disc or disc space finding at L1-2 or above.    L2-3: Mild desiccation and bulging of the disc. Mild bilateral facet  osteoarthritis. No compressive stenosis.    L3-4: Moderate bulging of the disc. Mild facet and ligamentous  hypertrophy. Narrowing of the lateral recesses left more than right.  Mild facet edema which could be associated with back pain or  referred facet syndrome pain.    L4-5: Moderate bulging of the disc. Bilateral facet osteoarthritis  allowing 1 mm of anterolisthesis. Mild stenosis of both lateral  recesses. Mild facet edema which could be associated with back pain  or referred facet syndrome pain.    L5-S1: Shallow disc protrusion in the left posterolateral direction.  Facet and ligamentous hypertrophy. Narrowing of both subarticular  lateral recesses left more than right. Some potential to affect  either S1 nerve, particularly the left. Mild facet edema could be  associated with back pain or referred facet syndrome pain.    IMPRESSION:  Degenerative disc disease and degenerative facet disease in the  lumbar spine from L2-3 through L5-S1. Throughout the region, the  facet arthropathy could be associated with back pain or referred  facet syndrome pain, joints showing most pronounced involvement at  L3-4 and L4-5. Disc bulges throughout the region contribute to  lateral recess stenosis that could potentially be symptomatic,  particularly at the  L4-5 and L5-S1 levels.      Electronically Signed  By: Nelson Chimes M.D.  On: 02/13/2020 08:01     Objective:  VS:  HT:    WT:   BMI:     BP:(!) 142/92  HR:69bpm  TEMP: ( )  RESP:  Physical Exam Vitals and nursing note reviewed.  Constitutional:      General: She is not in acute distress.    Appearance: Normal appearance. She is obese. She is not ill-appearing.  HENT:     Head:  Normocephalic and atraumatic.     Right Ear: External ear normal.     Left Ear: External ear normal.  Eyes:     Extraocular Movements: Extraocular movements intact.  Cardiovascular:     Rate and Rhythm: Normal rate.     Pulses: Normal pulses.  Pulmonary:     Effort: Pulmonary effort is normal. No respiratory distress.  Abdominal:     General: There is no distension.     Palpations: Abdomen is soft.  Musculoskeletal:        General: Tenderness present.     Cervical back: Neck supple.     Right lower leg: No edema.     Left lower leg: No edema.     Comments: Patient has good distal strength with no pain over the greater trochanters.  No clonus or focal weakness.  Skin:    Findings: No erythema, lesion or rash.  Neurological:     General: No focal deficit present.     Mental Status: She is alert and oriented to person, place, and time.     Sensory: No sensory deficit.     Motor: No weakness or abnormal muscle tone.     Coordination: Coordination normal.  Psychiatric:        Mood and Affect: Mood normal.        Behavior: Behavior normal.      Imaging: No results found.

## 2021-02-14 NOTE — Patient Instructions (Signed)

## 2021-02-14 NOTE — Procedures (Signed)
S1 Lumbosacral Transforaminal Epidural Steroid Injection - Sub-Pedicular Approach with Fluoroscopic Guidance   Patient: Monique Clark      Date of Birth: 25-Aug-1966 MRN: [id number] PCP: Hoyt Koch, MD      Visit Date: 02/14/2021   Universal Protocol:    Date/Time: 05/19/228:22 AM  Consent Given By: the patient  Position:  PRONE  Additional Comments: Vital signs were monitored before and after the procedure. Patient was prepped and draped in the usual sterile fashion. The correct patient, procedure, and site was verified.   Injection Procedure Details:  Procedure Site One Meds Administered:  Meds ordered this encounter  Medications  . methylPREDNISolone acetate (DEPO-MEDROL) injection 80 mg    Laterality: Right  Location/Site:  S1 Foramen   Needle size: 22 ga.  Needle type: Spinal  Needle Placement: Transforaminal  Findings:   -Comments: Excellent flow of contrast along the nerve, nerve root and into the epidural space.  Epidurogram: Contrast epidurogram showed no nerve root cut off or restricted flow pattern.  Procedure Details: After squaring off the sacral end-plate to get a true AP view, the C-arm was positioned so that the best possible view of the S1 foramen was visualized. The soft tissues overlying this structure were infiltrated with 2-3 ml. of 1% Lidocaine without Epinephrine.    The spinal needle was inserted toward the target using a "trajectory" view along the fluoroscope beam.  Under AP and lateral visualization, the needle was advanced so it did not puncture dura. Biplanar projections were used to confirm position. Aspiration was confirmed to be negative for CSF and/or blood. A 1-2 ml. volume of Isovue-250 was injected and flow of contrast was noted at each level. Radiographs were obtained for documentation purposes.   After attaining the desired flow of contrast documented above, a 0.5 to 1.0 ml test dose of 0.25% Marcaine was injected  into each respective transforaminal space.  The patient was observed for 90 seconds post injection.  After no sensory deficits were reported, and normal lower extremity motor function was noted,   the above injectate was administered so that equal amounts of the injectate were placed at each foramen (level) into the transforaminal epidural space.   Additional Comments:  The patient tolerated the procedure well Dressing: Band-Aid with 2 x 2 sterile gauze    Post-procedure details: Patient was observed during the procedure. Post-procedure instructions were reviewed.  Patient left the clinic in stable condition.

## 2021-02-14 NOTE — Telephone Encounter (Signed)
Prior authorization has been initiated on [employer]eds for Liraglutide -Weight Management (SAXENDA) 18 MG/3ML SOPN.  Key: [id number] ICD 10 Code: E66.01    This request has not been approved. Based on the information submitted for review, you did not meet our guideline rules for the requested drug. In order for your request to be approved, your provider would need to show that you have met the guideline rules below. The details below are written in medical language. If you have questions, please contact your provider. In some cases, the requested medication or alternatives offered may have additional approval requirements. For weight loss or weight loss management, our guideline named ANTI-OBESITY AGENTS (reviewed for Saxenda 61m/3mL pens) requires that you have evidence of active enrollment in an exercise and caloric reduction program or a weight loss/behavioral modification program. This request was denied because we did not receive information that you meet the requirement listed above. Please work with your doctor to use a different medication or get [employer] information if it will allow [employer] approve this request. A written notification letter will follow with additional details.   Patient has been notified via mychart.

## 2021-02-14 NOTE — Progress Notes (Signed)
Pt state lower back pain that travels down her right buttocks and leg. Pt state walking makes the pain worse. Pt state she take pain meds to help ease her pain. Pt has hx of inj on 05/07/20 pt state it helped a little.  Numeric Pain Rating Scale and Functional Assessment Average Pain 6   In the last MONTH (on 0-10 scale) has pain interfered with the following?  1. General activity like being  able to carry out your everyday physical activities such as walking, climbing stairs, carrying groceries, or moving a chair?  Rating(8)   +Driver, +BT has has been off for a few days, -Dye Allergies.

## 2021-02-16 ENCOUNTER — Other Ambulatory Visit (HOSPITAL_COMMUNITY): Payer: Self-pay

## 2021-02-18 ENCOUNTER — Other Ambulatory Visit (HOSPITAL_COMMUNITY): Payer: Self-pay

## 2021-02-20 ENCOUNTER — Other Ambulatory Visit (HOSPITAL_COMMUNITY): Payer: Self-pay

## 2021-02-20 ENCOUNTER — Encounter: Payer: Self-pay | Admitting: Physical Medicine and Rehabilitation

## 2021-02-21 ENCOUNTER — Encounter: Payer: Self-pay | Admitting: Family Medicine

## 2021-02-21 DIAGNOSIS — M25551 Pain in right hip: Secondary | ICD-10-CM

## 2021-02-21 DIAGNOSIS — M5441 Lumbago with sciatica, right side: Secondary | ICD-10-CM

## 2021-02-27 ENCOUNTER — Other Ambulatory Visit (HOSPITAL_COMMUNITY): Payer: Self-pay

## 2021-02-27 NOTE — Telephone Encounter (Signed)
[employer] called and said that they have denied the PA and said that they sent over a fax for an appeal. They were wondering if it would be appealed. They said that they faxed the papers on 02/18/21. Please advise   Phone: [phone] Reference: [facility]

## 2021-02-27 NOTE — Addendum Note (Signed)
Addended by: Hortencia Pilar on: 02/27/2021 11:18 AM   Modules accepted: Orders

## 2021-02-27 NOTE — Telephone Encounter (Signed)
Appeal has been faxed. Confirmation fax has been received.

## 2021-03-04 ENCOUNTER — Ambulatory Visit (INDEPENDENT_AMBULATORY_CARE_PROVIDER_SITE_OTHER): Payer: 59 | Admitting: Family Medicine

## 2021-03-04 ENCOUNTER — Ambulatory Visit: Payer: Self-pay

## 2021-03-04 ENCOUNTER — Other Ambulatory Visit (HOSPITAL_COMMUNITY): Payer: Self-pay

## 2021-03-04 ENCOUNTER — Other Ambulatory Visit: Payer: Self-pay

## 2021-03-04 DIAGNOSIS — M79604 Pain in right leg: Secondary | ICD-10-CM

## 2021-03-04 DIAGNOSIS — M5441 Lumbago with sciatica, right side: Secondary | ICD-10-CM | POA: Diagnosis not present

## 2021-03-04 DIAGNOSIS — M25551 Pain in right hip: Secondary | ICD-10-CM

## 2021-03-04 MED ORDER — HYDROCODONE-ACETAMINOPHEN 5-325 MG PO TABS
1.0000 | ORAL_TABLET | Freq: Four times a day (QID) | ORAL | 0 refills | Status: DC | PRN
  Filled 2021-03-04: qty 20, 5d supply, fill #0

## 2021-03-04 NOTE — Progress Notes (Signed)
Subjective: She is here for attempted right ultrasound-guided piriformis injection.  She is having ongoing sciatica pain.  Objective: She is tender to palpation along the sciatic notch all the way to the greater trochanter.  Ultrasound: I was unable to adequately visualize the piriformis muscle.  Impression: Right-sided sciatica, probably from lumbar source but cannot rule out piriformis syndrome.  Plan: Elected to refer her for CT-guided piriformis injection.

## 2021-03-06 NOTE — Telephone Encounter (Signed)
Appeal has been approved through 07/04/2021. Maximum supply of 15 mL per 30 days. Patient has been made aware of the approval.

## 2021-03-07 ENCOUNTER — Other Ambulatory Visit: Payer: Self-pay | Admitting: Family Medicine

## 2021-03-08 ENCOUNTER — Other Ambulatory Visit: Payer: Self-pay | Admitting: Family Medicine

## 2021-03-08 DIAGNOSIS — Z87440 Personal history of urinary (tract) infections: Secondary | ICD-10-CM | POA: Insufficient documentation

## 2021-03-08 DIAGNOSIS — M5441 Lumbago with sciatica, right side: Secondary | ICD-10-CM

## 2021-03-08 DIAGNOSIS — M25551 Pain in right hip: Secondary | ICD-10-CM

## 2021-03-08 DIAGNOSIS — C801 Malignant (primary) neoplasm, unspecified: Secondary | ICD-10-CM | POA: Insufficient documentation

## 2021-03-08 DIAGNOSIS — Z8701 Personal history of pneumonia (recurrent): Secondary | ICD-10-CM | POA: Insufficient documentation

## 2021-03-11 ENCOUNTER — Other Ambulatory Visit (HOSPITAL_COMMUNITY): Payer: Self-pay

## 2021-03-13 ENCOUNTER — Other Ambulatory Visit (HOSPITAL_COMMUNITY): Payer: Self-pay

## 2021-03-13 MED ORDER — INSULIN PEN NEEDLE 32G X 4 MM MISC
0 refills | Status: DC
  Filled 2021-03-13: qty 100, 90d supply, fill #0

## 2021-03-15 ENCOUNTER — Other Ambulatory Visit (HOSPITAL_COMMUNITY): Payer: Self-pay

## 2021-03-15 MED ORDER — HYDROCODONE-ACETAMINOPHEN 5-325 MG PO TABS
1.0000 | ORAL_TABLET | Freq: Two times a day (BID) | ORAL | 0 refills | Status: DC | PRN
  Filled 2021-03-15: qty 20, 10d supply, fill #0

## 2021-03-15 NOTE — Addendum Note (Signed)
Addended by: Hortencia Pilar on: 03/15/2021 02:12 PM   Modules accepted: Orders

## 2021-03-19 DIAGNOSIS — D801 Nonfamilial hypogammaglobulinemia: Secondary | ICD-10-CM | POA: Diagnosis not present

## 2021-03-21 ENCOUNTER — Encounter: Payer: Self-pay | Admitting: Family Medicine

## 2021-03-25 ENCOUNTER — Ambulatory Visit
Admission: RE | Admit: 2021-03-25 | Discharge: 2021-03-25 | Disposition: A | Discharge status: Home / Self Care | Payer: 59 | Source: Ambulatory Visit | Attending: Family Medicine | Admitting: Family Medicine

## 2021-03-25 ENCOUNTER — Other Ambulatory Visit: Payer: Self-pay

## 2021-03-25 DIAGNOSIS — M5441 Lumbago with sciatica, right side: Secondary | ICD-10-CM

## 2021-03-25 DIAGNOSIS — M25551 Pain in right hip: Secondary | ICD-10-CM

## 2021-03-25 DIAGNOSIS — M5431 Sciatica, right side: Secondary | ICD-10-CM | POA: Diagnosis not present

## 2021-03-25 MED ORDER — METHYLPREDNISOLONE ACETATE 40 MG/ML INJ SUSP (RADIOLOG
80.0000 mg | Freq: Once | INTRAMUSCULAR | Status: AC
  Administered 2021-03-25: 80 mg via INTRAMUSCULAR

## 2021-03-29 ENCOUNTER — Encounter: Payer: Self-pay | Admitting: Family Medicine

## 2021-03-29 ENCOUNTER — Other Ambulatory Visit (HOSPITAL_COMMUNITY): Payer: Self-pay

## 2021-03-29 ENCOUNTER — Other Ambulatory Visit: Payer: Self-pay

## 2021-03-29 NOTE — Addendum Note (Signed)
Addended by: Hortencia Pilar on: 03/29/2021 08:13 AM   Modules accepted: Orders

## 2021-04-02 ENCOUNTER — Other Ambulatory Visit (HOSPITAL_COMMUNITY): Payer: Self-pay

## 2021-04-02 DIAGNOSIS — L739 Follicular disorder, unspecified: Secondary | ICD-10-CM | POA: Diagnosis not present

## 2021-04-02 DIAGNOSIS — K610 Anal abscess: Secondary | ICD-10-CM | POA: Diagnosis not present

## 2021-04-02 DIAGNOSIS — B373 Candidiasis of vulva and vagina: Secondary | ICD-10-CM | POA: Diagnosis not present

## 2021-04-02 MED ORDER — NEBIVOLOL HCL 2.5 MG PO TABS
2.5000 mg | ORAL_TABLET | Freq: Every day | ORAL | 1 refills | Status: DC
  Filled 2021-04-02: qty 90, 90d supply, fill #0
  Filled 2021-07-01: qty 90, 90d supply, fill #1

## 2021-04-09 ENCOUNTER — Other Ambulatory Visit: Payer: Self-pay | Admitting: Internal Medicine

## 2021-04-09 ENCOUNTER — Other Ambulatory Visit (HOSPITAL_COMMUNITY): Payer: Self-pay

## 2021-04-09 ENCOUNTER — Other Ambulatory Visit: Payer: Self-pay

## 2021-04-09 MED ORDER — VITAMIN D3 1.25 MG (50000 UT) PO CAPS
ORAL_CAPSULE | ORAL | 5 refills | Status: DC
  Filled 2021-04-09 – 2021-04-25 (×2): qty 8, 28d supply, fill #0
  Filled 2021-05-27: qty 8, 28d supply, fill #1
  Filled 2021-07-15: qty 8, 28d supply, fill #2
  Filled 2021-07-16 – 2021-08-24 (×2): qty 8, 28d supply, fill #3
  Filled 2021-09-30: qty 8, 28d supply, fill #4
  Filled 2021-10-29: qty 8, 28d supply, fill #5

## 2021-04-10 ENCOUNTER — Other Ambulatory Visit (HOSPITAL_COMMUNITY): Payer: Self-pay

## 2021-04-19 ENCOUNTER — Other Ambulatory Visit (HOSPITAL_COMMUNITY): Payer: Self-pay

## 2021-04-22 ENCOUNTER — Encounter (HOSPITAL_COMMUNITY): Payer: Self-pay

## 2021-04-22 ENCOUNTER — Emergency Department (HOSPITAL_COMMUNITY): Payer: 59

## 2021-04-22 ENCOUNTER — Telehealth: Payer: Self-pay

## 2021-04-22 ENCOUNTER — Emergency Department (HOSPITAL_COMMUNITY)
Admission: EM | Admit: 2021-04-22 | Discharge: 2021-04-23 | Disposition: A | Discharge status: Home / Self Care | Payer: 59 | Attending: Emergency Medicine | Admitting: Emergency Medicine

## 2021-04-22 ENCOUNTER — Other Ambulatory Visit: Payer: Self-pay

## 2021-04-22 DIAGNOSIS — Z96643 Presence of artificial hip joint, bilateral: Secondary | ICD-10-CM | POA: Insufficient documentation

## 2021-04-22 DIAGNOSIS — R7303 Prediabetes: Secondary | ICD-10-CM | POA: Diagnosis not present

## 2021-04-22 DIAGNOSIS — Z7901 Long term (current) use of anticoagulants: Secondary | ICD-10-CM | POA: Insufficient documentation

## 2021-04-22 DIAGNOSIS — Z794 Long term (current) use of insulin: Secondary | ICD-10-CM | POA: Diagnosis not present

## 2021-04-22 DIAGNOSIS — Z87891 Personal history of nicotine dependence: Secondary | ICD-10-CM | POA: Insufficient documentation

## 2021-04-22 DIAGNOSIS — Z79899 Other long term (current) drug therapy: Secondary | ICD-10-CM | POA: Insufficient documentation

## 2021-04-22 DIAGNOSIS — I480 Paroxysmal atrial fibrillation: Secondary | ICD-10-CM | POA: Diagnosis not present

## 2021-04-22 DIAGNOSIS — I10 Essential (primary) hypertension: Secondary | ICD-10-CM | POA: Insufficient documentation

## 2021-04-22 DIAGNOSIS — R059 Cough, unspecified: Secondary | ICD-10-CM | POA: Diagnosis not present

## 2021-04-22 DIAGNOSIS — U071 COVID-19: Secondary | ICD-10-CM

## 2021-04-22 DIAGNOSIS — R2243 Localized swelling, mass and lump, lower limb, bilateral: Secondary | ICD-10-CM | POA: Diagnosis not present

## 2021-04-22 DIAGNOSIS — R079 Chest pain, unspecified: Secondary | ICD-10-CM | POA: Diagnosis not present

## 2021-04-22 DIAGNOSIS — R0602 Shortness of breath: Secondary | ICD-10-CM

## 2021-04-22 DIAGNOSIS — M7989 Other specified soft tissue disorders: Secondary | ICD-10-CM

## 2021-04-22 LAB — CBC
HCT: 37.4 % (ref 36.0–46.0)
Hemoglobin: 11.7 g/dL — ABNORMAL LOW (ref 12.0–15.0)
MCH: 27.9 pg (ref 26.0–34.0)
MCHC: 31.3 g/dL (ref 30.0–36.0)
MCV: 89.3 fL (ref 80.0–100.0)
Platelets: 364 10*3/uL (ref 150–400)
RBC: 4.19 MIL/uL (ref 3.87–5.11)
RDW: 17.2 % — ABNORMAL HIGH (ref 11.5–15.5)
WBC: 7.9 10*3/uL (ref 4.0–10.5)
nRBC: 0 % (ref 0.0–0.2)

## 2021-04-22 LAB — COMPREHENSIVE METABOLIC PANEL
ALT: 24 U/L (ref 0–44)
AST: 33 U/L (ref 15–41)
Albumin: 2.8 g/dL — ABNORMAL LOW (ref 3.5–5.0)
Alkaline Phosphatase: 80 U/L (ref 38–126)
Anion gap: 9 (ref 5–15)
BUN: 8 mg/dL (ref 6–20)
CO2: 28 mmol/L (ref 22–32)
Calcium: 8.9 mg/dL (ref 8.9–10.3)
Chloride: 102 mmol/L (ref 98–111)
Creatinine, Ser: 0.62 mg/dL (ref 0.44–1.00)
GFR, Estimated: >60 mL/min (ref >60)
Glucose, Bld: 109 mg/dL — ABNORMAL HIGH (ref 70–99)
Potassium: 4.1 mmol/L (ref 3.5–5.1)
Sodium: 139 mmol/L (ref 135–145)
Total Bilirubin: 0.4 mg/dL (ref 0.3–1.2)
Total Protein: 6.1 g/dL — ABNORMAL LOW (ref 6.5–8.1)

## 2021-04-22 NOTE — ED Provider Notes (Signed)
Emergency Medicine Provider Triage Evaluation Note  Monique Clark , a 55 y.o. female  was evaluated in triage.  Pt complains of shortness of breath after testing positive for COVID yesterday.  Patient admits to some slight chest tightness, body aches, myalgias and some lower extremity edema.  Patient denies any nausea vomiting diarrhea.  Patient does admit to headache, denies any fevers.  Husband is home sick as well.  Has been vaccinated and boosted against COVID.  Patient was recently on a cruise ship.  Patient is in remission from non-Hodgkin's lymphoma, status postsplenectomy.  Does take IgG infusions every 2 weeks.  Review of Systems  Positive: Cough, shortness of breath, chest tightness, headaches, myalgias Negative: Fever  Physical Exam  BP (!) 172/101 (BP Location: Right Arm)   Pulse 66   Temp 97.7 F (36.5 C) (Oral)   Resp 16   SpO2 97%  Gen:   Awake, no distress  Resp:  Normal effort MSK:   Moves extremities without difficulty Other:   Medical Decision Making  Medically screening exam initiated at 2:51 PM.  Appropriate orders placed.  Carolee Erekson was informed that the remainder of the evaluation will be completed by another provider, this initial triage assessment does not replace that evaluation, and the importance of remaining in the ED until their evaluation is complete.    Alfredia Client, PA-C 04/22/21 1453    Daleen Bo, MD 04/23/21 1740

## 2021-04-22 NOTE — ED Triage Notes (Signed)
Pt reports sob since Saturday, tested positive for COVID with a home test yesterday.

## 2021-04-22 NOTE — Telephone Encounter (Signed)
Spoke with the patient and she is currently heading to the ED now. She asked that  her appt on 04/24/2021 be cancelled and that she will call back and reschedule.

## 2021-04-22 NOTE — Telephone Encounter (Signed)
See below

## 2021-04-23 ENCOUNTER — Telehealth (HOSPITAL_COMMUNITY): Payer: Self-pay | Admitting: Emergency Medicine

## 2021-04-23 DIAGNOSIS — Z87891 Personal history of nicotine dependence: Secondary | ICD-10-CM | POA: Diagnosis not present

## 2021-04-23 DIAGNOSIS — Z794 Long term (current) use of insulin: Secondary | ICD-10-CM | POA: Diagnosis not present

## 2021-04-23 DIAGNOSIS — U071 COVID-19: Secondary | ICD-10-CM | POA: Diagnosis not present

## 2021-04-23 DIAGNOSIS — R7303 Prediabetes: Secondary | ICD-10-CM | POA: Diagnosis not present

## 2021-04-23 DIAGNOSIS — Z96643 Presence of artificial hip joint, bilateral: Secondary | ICD-10-CM | POA: Diagnosis not present

## 2021-04-23 DIAGNOSIS — Z7901 Long term (current) use of anticoagulants: Secondary | ICD-10-CM | POA: Diagnosis not present

## 2021-04-23 DIAGNOSIS — Z79899 Other long term (current) drug therapy: Secondary | ICD-10-CM | POA: Diagnosis not present

## 2021-04-23 DIAGNOSIS — R2243 Localized swelling, mass and lump, lower limb, bilateral: Secondary | ICD-10-CM | POA: Diagnosis not present

## 2021-04-23 DIAGNOSIS — I10 Essential (primary) hypertension: Secondary | ICD-10-CM | POA: Diagnosis not present

## 2021-04-23 LAB — RESP PANEL BY RT-PCR (FLU A&B, COVID) ARPGX2
Influenza A by PCR: NEGATIVE
Influenza B by PCR: NEGATIVE
SARS Coronavirus 2 by RT PCR: POSITIVE — AB

## 2021-04-23 LAB — TROPONIN I (HIGH SENSITIVITY): Troponin I (High Sensitivity): 11 ng/L (ref <18)

## 2021-04-23 MED ORDER — DIPHENHYDRAMINE HCL 50 MG/ML IJ SOLN: 50.0000 mg | Freq: Once | INTRAMUSCULAR | Status: DC | PRN

## 2021-04-23 MED ORDER — ALBUTEROL SULFATE HFA 108 (90 BASE) MCG/ACT IN AERS: 2.0000 | INHALATION_SPRAY | Freq: Once | RESPIRATORY_TRACT | Status: DC | PRN

## 2021-04-23 MED ORDER — SODIUM CHLORIDE 0.9 % IV SOLN: INTRAVENOUS | Status: DC | PRN

## 2021-04-23 MED ORDER — FAMOTIDINE IN NACL 20-0.9 MG/50ML-% IV SOLN: 20.0000 mg | Freq: Once | INTRAVENOUS | Status: DC | PRN

## 2021-04-23 MED ORDER — BEBTELOVIMAB 175 MG/2 ML IV (EUA)
175.0000 mg | Freq: Once | INTRAMUSCULAR | Status: AC
  Administered 2021-04-23: 175 mg via INTRAVENOUS
  Filled 2021-04-23: qty 2

## 2021-04-23 MED ORDER — METHYLPREDNISOLONE SODIUM SUCC 125 MG IJ SOLR: 125.0000 mg | Freq: Once | INTRAMUSCULAR | Status: DC | PRN

## 2021-04-23 MED ORDER — EPINEPHRINE 0.3 MG/0.3ML IJ SOAJ: 0.3000 mg | Freq: Once | INTRAMUSCULAR | Status: DC | PRN

## 2021-04-23 NOTE — Telephone Encounter (Signed)
Outpatient LE dopplers ordered

## 2021-04-23 NOTE — Discharge Instructions (Addendum)
Keep yourself quarantined at home.  Use Tylenol or Motrin as needed for aches and fever.  Return to the ED with difficulty breathing, chest pain, intractable nausea and vomiting or any other concerns.

## 2021-04-23 NOTE — ED Notes (Signed)
While ambulate pt in hallway 107 heartrate  99% RA

## 2021-04-23 NOTE — ED Provider Notes (Addendum)
[facility] EMERGENCY DEPARTMENT Provider Note   CSN: [id number] Arrival date & time: 04/22/21  1241     History Chief Complaint  Patient presents with   Covid Positive   Shortness of Breath    Monique Clark is a 55 y.o. female.  Patient with a history of atrial fibrillation on Xarelto, CVI D, non-Hodgkin's lymphoma receiving IgG infusions every 2 weeks, high cholesterol, pots, hypertension presenting with COVID-positive test and shortness of breath.  States positive test at home yesterday with shortness of breath, chest tightness, body aches, myalgias and lower extremity edema.  Has had nasal congestion and cough.  No significant headache.  No nausea, vomiting or diarrhea.  Husband at home is sick as well.  Does have some chest tightness there is been constant all day.  Increase shortness of breath especially with exertion.  Denies any missed Xarelto doses. Has leg swelling which she does not normally have.  Denies any fever at home. Has had nasal congestion, cough, headache, chest tightness and shortness of breath.  The history is provided by the patient.  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   Diverticulosis    Elevated cholesterol    High blood pressure    Non Hodgkin's lymphoma (HCC)    Obesity    Pneumonia    POTS (postural orthostatic tachycardia syndrome)    UTI (urinary tract infection)     Patient Active Problem List   Diagnosis Date Noted   Elevated serum hCG 07/02/2020   Paroxysmal atrial fibrillation ([place])    Localized swelling of left lower extremity    Lightheadedness    Essential hypertension    History of left hip replacement  07/29/2018   Morbid obesity with body mass index (BMI) of 50.0 to 59.9 in adult [facility]) 12/28/2015   CVID (common variable immunodeficiency) ([place]) 05/07/2015   Pneumonia 05/01/2015   Post-nasal discharge 03/30/2013   Prediabetes 01/20/2013   S/P splenectomy 01/20/2013   History of non-Hodgkin's lymphoma 12/21/2012   Low back pain radiating to right lower extremity 12/10/2012   Right lumbar radiculitis 12/10/2012    Past Surgical History:  Procedure Laterality Date   ABDOMINAL HYSTERECTOMY     APPENDECTOMY     CESAREAN SECTION     GALLBLADDER SURGERY     JOINT REPLACEMENT Left    hip   JOINT REPLACEMENT Right    Right Hip 2022   left shoulder repair     LUNG REMOVAL, PARTIAL     myringostomy Bilateral    SPLENECTOMY, TOTAL     TONSILLECTOMY AND ADENOIDECTOMY       OB History     Gravida  3   Para  3   Term  3   Preterm      AB      Living  3      SAB      IAB      Ectopic      Multiple      Live Births              Family History  Problem Relation Age of Onset   Atrial fibrillation Mother  Cancer Father        Colon   Atrial fibrillation Maternal Uncle    Atrial fibrillation Maternal Grandmother    Atrial fibrillation Maternal Grandfather    Heart attack Maternal Grandfather    Ovarian cancer Neg Hx     Social History   Tobacco Use   Smoking status: Former    Packs/day: 0.50    Types: Cigarettes    Quit date: 09/29/2002    Years since quitting: 18.5   Smokeless tobacco: Never  Vaping Use   Vaping Use: Never used  Substance Use Topics   Alcohol use: No   Drug use: No    Home Medications Prior to Admission medications   Medication Sig Start Date End Date Taking? Authorizing Provider  Cholecalciferol (VITAMIN D3) 1.25 MG (50000 UT) CAPS TAKE 1 CAPSULE BY MOUTH TWICE A WEEK AS DIRECTED 04/09/21 04/09/22  Hoyt Koch, MD  COENZYME Q10 PO Take 1 capsule by mouth daily.     [provider]  diazepam (VALIUM) 5 MG  tablet TAKE 1/2 TO 1 TABLET BY MOUTH TWICE DAILY IF NEEDED Patient taking differently: TAKE 1/2 TO 1 TABLET BY MOUTH TWICE DAILY IF NEEDED 01/18/21   Hilts, Legrand Como, MD  diphenhydrAMINE (BENADRYL) 25 MG tablet Take 50 mg by mouth See admin instructions. Take 2 tablets (50 mg) by mouth at bedtime as needed for sleep, and take 2 tablets (50 mg) with each dose of Hizentra    [provider]  DULoxetine (CYMBALTA) 60 MG capsule Take 1 capsule (60 mg total) by mouth daily. 01/15/21   Hoyt Koch, MD  flecainide (TAMBOCOR) 50 MG tablet TAKE 1 TABLET BY MOUTH EVERY 12 HOURS 10/17/20 10/17/21  Baruch Gouty, FNP  gabapentin (NEURONTIN) 100 MG capsule Take 1 capsule by mouth at bedtime, may increase to 1 capsule by mouth three times daily if needed. 01/07/21   Hilts, Legrand Como, MD  HYDROcodone-acetaminophen (NORCO/VICODIN) 5-325 MG tablet Take 1 tablet by mouth 2 (two) times daily as needed for moderate pain or severe pain. 03/15/21   Hilts, Legrand Como, MD  ibuprofen (ADVIL) 200 MG tablet Take 800 mg by mouth 3 (three) times daily.    [provider]  Immune Globulin, Human, 4 GM/20ML SOLN Inject 36 g into the skin every 14 (fourteen) days. Monday 05/03/15   [provider]  Insulin Pen Needle 32G X 4 MM MISC Use as directed with Saxenda 01/21/21   Hoyt Koch, MD  methocarbamol (ROBAXIN) 750 MG tablet TAKE 1 TABLET BY MOUTH EVERY 6 HOURS AS NEEDED FOR MUSCLE SPASMS. 09/06/20 09/06/21  Hilts, Legrand Como, MD  nebivolol (BYSTOLIC) 2.5 MG tablet Take 1 tablet (2.5 mg total) by mouth 2 (two) times a day. Patient taking differently: Take 2.5 mg by mouth daily. 03/16/19   Mullis, Kiersten P, DO  nebivolol (BYSTOLIC) 2.5 MG tablet TAKE 1 TABLET BY MOUTH ONCE DAILY 03/30/21     rivaroxaban (XARELTO) 20 MG TABS tablet TAKE 1 TABLET BY MOUTH DAILY 10/29/20 10/29/21  Laymond Purser D, MD  rosuvastatin (CRESTOR) 10 MG tablet Take 1 tablet (10 mg total) by mouth daily at 6 PM. Patient taking  differently: Take 10 mg by mouth daily. 03/16/19   Mullis, Kiersten P, DO  traMADol (ULTRAM) 50 MG tablet Take 1 tablet (50 mg total) by mouth 3 (three) times daily as needed for moderate pain or severe pain. Patient not taking: Reported on 03/04/2021 01/29/21   [name] Blase, MD    Allergies  Nizatidine, Promethazine, and Tetanus toxoids  Review of Systems   Review of Systems  Constitutional:  Positive for activity change, appetite change, chills, fatigue and fever.  HENT:  Positive for congestion and rhinorrhea.   Eyes:  Negative for visual disturbance.  Respiratory:  Positive for cough, chest tightness and shortness of breath.   Cardiovascular:  Positive for leg swelling. Negative for chest pain.  Gastrointestinal:  Negative for abdominal pain, nausea and vomiting.  Genitourinary:  Negative for dysuria and hematuria.  Musculoskeletal:  Positive for arthralgias and myalgias.  Neurological:  Positive for weakness and headaches. Negative for dizziness.   all other systems are negative except as noted in the HPI and PMH.   Physical Exam Updated Vital Signs BP (!) 158/101 (BP Location: Left Wrist)   Pulse 66   Temp 98.4 F (36.9 C) (Oral)   Resp 20   SpO2 95%   Physical Exam Vitals and nursing note reviewed.  Constitutional:      General: She is not in acute distress.    Appearance: She is well-developed. She is obese.  HENT:     Head: Normocephalic and atraumatic.     Nose: Congestion present.     Mouth/Throat:     Pharynx: No oropharyngeal exudate.  Eyes:     Conjunctiva/sclera: Conjunctivae normal.     Pupils: Pupils are equal, round, and reactive to light.  Neck:     Comments: No meningismus. Cardiovascular:     Rate and Rhythm: Normal rate and regular rhythm.     Heart sounds: Normal heart sounds. No murmur heard. Pulmonary:     Effort: Pulmonary effort is normal. No respiratory distress.     Breath sounds: Normal breath sounds. No wheezing.  Abdominal:      Palpations: Abdomen is soft.     Tenderness: There is no abdominal tenderness. There is no guarding or rebound.  Musculoskeletal:        General: No tenderness. Normal range of motion.     Cervical back: Normal range of motion and neck supple.     Right lower leg: Edema present.     Left lower leg: Edema present.     Comments: +1 edema bilaterally.  Intact DP and PT pulses  Skin:    General: Skin is warm.  Neurological:     Mental Status: She is alert and oriented to person, place, and time.     Cranial Nerves: No cranial nerve deficit.     Motor: No abnormal muscle tone.     Coordination: Coordination normal.     Comments:  5/5 strength throughout. CN 2-12 intact.Equal grip strength.   Psychiatric:        Behavior: Behavior normal.    ED Results / Procedures / Treatments   Labs (all labs ordered are listed, but only abnormal results are displayed) Labs Reviewed  RESP PANEL BY RT-PCR (FLU A&B, COVID) ARPGX2 - Abnormal; Notable for the following components:      Result Value   SARS Coronavirus 2 by RT PCR POSITIVE (*)    All other components within normal limits  CBC - Abnormal; Notable for the following components:   Hemoglobin 11.7 (*)    RDW 17.2 (*)    All other components within normal limits  COMPREHENSIVE METABOLIC PANEL - Abnormal; Notable for the following components:   Glucose, Bld 109 (*)    Total Protein 6.1 (*)    Albumin 2.8 (*)    All other components within normal limits  TROPONIN I (HIGH SENSITIVITY)  TROPONIN I (HIGH SENSITIVITY)    EKG EKG Interpretation  Date/Time:  Monday April 22 2021 14:31:15 EDT Ventricular Rate:  65 PR Interval:  148 QRS Duration: 94 QT Interval:  424 QTC Calculation: 440 R Axis:   -4 Text Interpretation: Normal sinus rhythm Low voltage QRS Cannot rule out Anterior infarct , age undetermined Abnormal ECG T wave inversion III Confirmed by Ezequiel Essex [phone]) on 04/23/2021 3:30:27 AM  Radiology DG Chest 2 View  Result  Date: 04/22/2021 CLINICAL DATA:  Shortness of breath, cough and chest pain in a patient who is COVID-19 positive. EXAM: CHEST - 2 VIEW COMPARISON:  PA and lateral chest 03/18/2020. FINDINGS: Low lung volumes are unchanged. Lungs clear. Heart size is upper normal. No pneumothorax or pleural fluid. No acute or focal bony abnormality. IMPRESSION: No acute disease. Electronically Signed   By: Inge Rise M.D.   On: 04/22/2021 15:59    Procedures Procedures   Medications Ordered in ED Medications - No data to display  ED Course  I have reviewed the triage vital signs and the nursing notes.  Pertinent labs & imaging results that were available during my care of the patient were reviewed by me and considered in my medical decision making (see chart for details).    MDM Rules/Calculators/A&P                           Patient with a 1 day history of cough, congestion, chest tightness, shortness of breath, body aches and lower extremity edema.  Positive COVID test at home.  No hypoxia or increased work of breathing.  Denies any missed Xarelto doses.  She is on multiple medications that preclude use of Paxlovid.  Her chest x-ray is negative.  Doubt pulmonary embolism or DVT as she is compliant with her Xarelto and denies any missed doses.  She is able to ambulate without hypoxia.  Some mild leg swelling on exam. No evidence of CHF. Doubt DVT given xarelto compliance but will arrange for outpatient Dopplers due to PCP concern. Doppler unavailable currently.   D/w patient molnupiravir versus monoclonal antibody infusion.  Patient elects to receive monoclonal antibody infusion.  She tolerated this well. She is able to ambulate without desaturation.  Discussed quarantine precautions at home, antipyretics, oral hydration, PCP follow-up.  Return to the ED with difficulty breathing, chest pain, intractable nausea or vomiting or any other concerns.  Monique Clark was evaluated in Emergency  Department on 04/23/2021 for the symptoms described in the history of present illness. She was evaluated in the context of the global COVID-19 pandemic, which necessitated consideration that the patient might be at risk for infection with the SARS-CoV-2 virus that causes COVID-19. Institutional protocols and algorithms that pertain to the evaluation of patients at risk for COVID-19 are in a state of rapid change based on information released by regulatory bodies including the CDC and federal and state organizations. These policies and algorithms were followed during the patient's care in the ED.  Final Clinical Impression(s) / ED Diagnoses Final diagnoses:  [id number]    Rx / DC Orders ED Discharge Orders     None        Delphina Schum, Annie Main, MD 04/23/21 [id number]    Ezequiel Essex, MD 04/23/21 [phone]

## 2021-04-24 ENCOUNTER — Ambulatory Visit (HOSPITAL_COMMUNITY): Payer: 59 | Attending: Emergency Medicine

## 2021-04-24 ENCOUNTER — Ambulatory Visit: Payer: 59 | Admitting: Internal Medicine

## 2021-04-25 ENCOUNTER — Other Ambulatory Visit (HOSPITAL_COMMUNITY): Payer: Self-pay

## 2021-05-01 DIAGNOSIS — I340 Nonrheumatic mitral (valve) insufficiency: Secondary | ICD-10-CM | POA: Diagnosis not present

## 2021-05-01 DIAGNOSIS — I951 Orthostatic hypotension: Secondary | ICD-10-CM | POA: Diagnosis not present

## 2021-05-01 DIAGNOSIS — I10 Essential (primary) hypertension: Secondary | ICD-10-CM | POA: Diagnosis not present

## 2021-05-01 DIAGNOSIS — U099 Post covid-19 condition, unspecified: Secondary | ICD-10-CM | POA: Diagnosis not present

## 2021-05-01 DIAGNOSIS — I4891 Unspecified atrial fibrillation: Secondary | ICD-10-CM | POA: Diagnosis not present

## 2021-05-03 ENCOUNTER — Encounter: Payer: Self-pay | Admitting: Family Medicine

## 2021-05-03 ENCOUNTER — Other Ambulatory Visit (HOSPITAL_COMMUNITY): Payer: Self-pay

## 2021-05-03 MED ORDER — TRAMADOL HCL 50 MG PO TABS
50.0000 mg | ORAL_TABLET | Freq: Every evening | ORAL | 0 refills | Status: DC | PRN
  Filled 2021-05-03: qty 20, 20d supply, fill #0

## 2021-05-04 ENCOUNTER — Other Ambulatory Visit (HOSPITAL_COMMUNITY): Payer: Self-pay

## 2021-05-05 DIAGNOSIS — D801 Nonfamilial hypogammaglobulinemia: Secondary | ICD-10-CM | POA: Diagnosis not present

## 2021-05-06 ENCOUNTER — Ambulatory Visit: Payer: 59 | Admitting: Internal Medicine

## 2021-05-06 ENCOUNTER — Other Ambulatory Visit: Payer: Self-pay

## 2021-05-06 ENCOUNTER — Encounter: Payer: Self-pay | Admitting: Internal Medicine

## 2021-05-06 VITALS — BP 130/84 | HR 62 | Temp 98.4°F | Resp 18 | Ht 64.0 in | Wt 323.0 lb

## 2021-05-06 DIAGNOSIS — U071 COVID-19: Secondary | ICD-10-CM

## 2021-05-06 DIAGNOSIS — Z6841 Body Mass Index (BMI) 40.0 and over, adult: Secondary | ICD-10-CM

## 2021-05-06 DIAGNOSIS — M545 Low back pain, unspecified: Secondary | ICD-10-CM | POA: Diagnosis not present

## 2021-05-06 DIAGNOSIS — M791 Myalgia, unspecified site: Secondary | ICD-10-CM

## 2021-05-06 DIAGNOSIS — M79604 Pain in right leg: Secondary | ICD-10-CM

## 2021-05-06 DIAGNOSIS — M5441 Lumbago with sciatica, right side: Secondary | ICD-10-CM | POA: Diagnosis not present

## 2021-05-06 DIAGNOSIS — M6281 Muscle weakness (generalized): Secondary | ICD-10-CM | POA: Diagnosis not present

## 2021-05-06 DIAGNOSIS — R7303 Prediabetes: Secondary | ICD-10-CM | POA: Diagnosis not present

## 2021-05-06 DIAGNOSIS — R262 Difficulty in walking, not elsewhere classified: Secondary | ICD-10-CM | POA: Diagnosis not present

## 2021-05-06 DIAGNOSIS — M25551 Pain in right hip: Secondary | ICD-10-CM | POA: Diagnosis not present

## 2021-05-06 LAB — HIGH SENSITIVITY CRP: CRP, High Sensitivity: 40.33 mg/L — ABNORMAL HIGH (ref 0.000–5.000)

## 2021-05-06 LAB — SEDIMENTATION RATE: Sed Rate: 36 mm/hr — ABNORMAL HIGH (ref 0–30)

## 2021-05-06 LAB — HEMOGLOBIN A1C: Hgb A1c MFr Bld: 5.8 % (ref 4.6–6.5)

## 2021-05-06 NOTE — Progress Notes (Signed)
   Subjective:   Patient ID: Monique Clark, female    DOB: [birth year], 55 y.o.   MRN: [id number]  HPI The patient is a 55 YO female coming in for problems with joint pain (needs to see orthopedic and recommended Dr. Lorin Mercy, some concern she may need surgery), and saxenda (she feels that this is helping with appetite, weight is up since starting, she had symptoms so is taking 1.2 mg daily and did stop there, some stomach upset and flanks pain), and having some water retention since having covid-19 in the last 2 weeks.   Review of Systems  Constitutional:  Positive for unexpected weight change.  HENT: Negative.    Eyes: Negative.   Respiratory:  Negative for cough, chest tightness and shortness of breath.   Cardiovascular:  Positive for leg swelling. Negative for chest pain and palpitations.  Gastrointestinal:  Positive for abdominal pain. Negative for abdominal distention, constipation, diarrhea, nausea and vomiting.  Genitourinary:  Positive for flank pain.  Skin: Negative.   Neurological: Negative.   Psychiatric/Behavioral: Negative.     Objective:  Physical Exam Constitutional:      Appearance: She is well-developed. She is obese.  HENT:     Head: Normocephalic and atraumatic.  Cardiovascular:     Rate and Rhythm: Normal rate and regular rhythm.  Pulmonary:     Effort: Pulmonary effort is normal. No respiratory distress.     Breath sounds: Normal breath sounds. No wheezing or rales.  Abdominal:     General: Bowel sounds are normal. There is no distension.     Palpations: Abdomen is soft.     Tenderness: There is no abdominal tenderness. There is no rebound.  Musculoskeletal:     Cervical back: Normal range of motion.     Comments: Some swelling legs bilaterally without pitting  Skin:    General: Skin is warm and dry.  Neurological:     Mental Status: She is alert and oriented to person, place, and time.     Coordination: Coordination normal.    Vitals:   05/06/21 1041   BP: 130/84  Pulse: 62  Resp: 18  Temp: 98.4 F (36.9 C)  TempSrc: Oral  SpO2: 97%  Weight: (!) 323 lb (146.5 kg)  Height: '5\' 4"'$  (1.626 m)    This visit occurred during the SARS-CoV-2 public health emergency.  Safety protocols were in place, including screening questions prior to the visit, additional usage of staff PPE, and extensive cleaning of exam room while observing appropriate contact time as indicated for disinfecting solutions.   Assessment & Plan:

## 2021-05-06 NOTE — Patient Instructions (Signed)
We will check the labs today and get you in with Dr. Lorin Mercy.

## 2021-05-07 ENCOUNTER — Encounter: Payer: Self-pay | Admitting: Internal Medicine

## 2021-05-07 DIAGNOSIS — M25551 Pain in right hip: Secondary | ICD-10-CM | POA: Diagnosis not present

## 2021-05-07 DIAGNOSIS — M6281 Muscle weakness (generalized): Secondary | ICD-10-CM | POA: Diagnosis not present

## 2021-05-07 DIAGNOSIS — R262 Difficulty in walking, not elsewhere classified: Secondary | ICD-10-CM | POA: Diagnosis not present

## 2021-05-07 DIAGNOSIS — U071 COVID-19: Secondary | ICD-10-CM | POA: Insufficient documentation

## 2021-05-07 DIAGNOSIS — M5441 Lumbago with sciatica, right side: Secondary | ICD-10-CM | POA: Diagnosis not present

## 2021-05-07 NOTE — Assessment & Plan Note (Signed)
Counseled about common timeframe for recovery and that most symptoms should be resolved in 4-6 weeks. She does have some immune system problems and may have some longer timeframe for recovery than average.

## 2021-05-07 NOTE — Assessment & Plan Note (Signed)
Referral to orthopedic surgery and her prior orthopedic provider recommended Dr. Lorin Mercy who she would like to see. She is using tramadol for pain from orthopedics at this time. This is impacting her life significantly.

## 2021-05-07 NOTE — Assessment & Plan Note (Signed)
Weight is not decreasing with saxenda but she feels some improvement in food cravings and would like to continue at this time. Due to GI intolerance we are unable to raise the dose past 1.2 mg daily at this time.

## 2021-05-07 NOTE — Assessment & Plan Note (Signed)
Needs labs today with HgA1c. Weight is up slightly from before some may be fluid retention after covid-19. Taking saxenda.

## 2021-05-16 ENCOUNTER — Encounter: Payer: Self-pay | Admitting: Internal Medicine

## 2021-05-17 ENCOUNTER — Encounter: Payer: Self-pay | Admitting: Internal Medicine

## 2021-05-17 NOTE — Telephone Encounter (Signed)
LVM instructions for pt to call to schedule appt or respond to my chart message with availability.

## 2021-05-17 NOTE — Telephone Encounter (Signed)
Attempted to call the pt.  See insomnia message.

## 2021-05-20 DIAGNOSIS — R262 Difficulty in walking, not elsewhere classified: Secondary | ICD-10-CM | POA: Diagnosis not present

## 2021-05-20 DIAGNOSIS — M6281 Muscle weakness (generalized): Secondary | ICD-10-CM | POA: Diagnosis not present

## 2021-05-20 DIAGNOSIS — M5441 Lumbago with sciatica, right side: Secondary | ICD-10-CM | POA: Diagnosis not present

## 2021-05-20 DIAGNOSIS — M25551 Pain in right hip: Secondary | ICD-10-CM | POA: Diagnosis not present

## 2021-05-22 DIAGNOSIS — M25551 Pain in right hip: Secondary | ICD-10-CM | POA: Diagnosis not present

## 2021-05-22 DIAGNOSIS — I348 Other nonrheumatic mitral valve disorders: Secondary | ICD-10-CM | POA: Diagnosis not present

## 2021-05-22 DIAGNOSIS — M6281 Muscle weakness (generalized): Secondary | ICD-10-CM | POA: Diagnosis not present

## 2021-05-22 DIAGNOSIS — R262 Difficulty in walking, not elsewhere classified: Secondary | ICD-10-CM | POA: Diagnosis not present

## 2021-05-22 DIAGNOSIS — M5441 Lumbago with sciatica, right side: Secondary | ICD-10-CM | POA: Diagnosis not present

## 2021-05-22 DIAGNOSIS — I951 Orthostatic hypotension: Secondary | ICD-10-CM | POA: Diagnosis not present

## 2021-05-22 DIAGNOSIS — Z8616 Personal history of COVID-19: Secondary | ICD-10-CM | POA: Diagnosis not present

## 2021-05-22 DIAGNOSIS — I4891 Unspecified atrial fibrillation: Secondary | ICD-10-CM | POA: Diagnosis not present

## 2021-05-26 ENCOUNTER — Other Ambulatory Visit: Payer: Self-pay

## 2021-05-27 ENCOUNTER — Ambulatory Visit: Payer: 59 | Admitting: Internal Medicine

## 2021-05-27 ENCOUNTER — Other Ambulatory Visit: Payer: Self-pay

## 2021-05-27 ENCOUNTER — Encounter: Payer: Self-pay | Admitting: Internal Medicine

## 2021-05-27 ENCOUNTER — Other Ambulatory Visit (HOSPITAL_COMMUNITY): Payer: Self-pay

## 2021-05-27 ENCOUNTER — Other Ambulatory Visit: Payer: Self-pay | Admitting: Family Medicine

## 2021-05-27 VITALS — BP 130/80 | HR 75 | Temp 98.7°F | Resp 18 | Ht 64.0 in | Wt 323.0 lb

## 2021-05-27 DIAGNOSIS — R262 Difficulty in walking, not elsewhere classified: Secondary | ICD-10-CM | POA: Diagnosis not present

## 2021-05-27 DIAGNOSIS — M5441 Lumbago with sciatica, right side: Secondary | ICD-10-CM | POA: Diagnosis not present

## 2021-05-27 DIAGNOSIS — D839 Common variable immunodeficiency, unspecified: Secondary | ICD-10-CM | POA: Diagnosis not present

## 2021-05-27 DIAGNOSIS — M6281 Muscle weakness (generalized): Secondary | ICD-10-CM | POA: Diagnosis not present

## 2021-05-27 DIAGNOSIS — F5101 Primary insomnia: Secondary | ICD-10-CM | POA: Diagnosis not present

## 2021-05-27 DIAGNOSIS — E538 Deficiency of other specified B group vitamins: Secondary | ICD-10-CM

## 2021-05-27 DIAGNOSIS — M25551 Pain in right hip: Secondary | ICD-10-CM | POA: Diagnosis not present

## 2021-05-27 LAB — CBC WITH DIFFERENTIAL/PLATELET
Basophils Absolute: 0.1 10*3/uL (ref 0.0–0.1)
Basophils Relative: 0.7 % (ref 0.0–3.0)
Eosinophils Absolute: 0.3 10*3/uL (ref 0.0–0.7)
Eosinophils Relative: 2.2 % (ref 0.0–5.0)
HCT: 35.3 % — ABNORMAL LOW (ref 36.0–46.0)
Hemoglobin: 11.2 g/dL — ABNORMAL LOW (ref 12.0–15.0)
Lymphocytes Relative: 25.8 % (ref 12.0–46.0)
Lymphs Abs: 3.2 10*3/uL (ref 0.7–4.0)
MCHC: 31.8 g/dL (ref 30.0–36.0)
MCV: 86.9 fl (ref 78.0–100.0)
Monocytes Absolute: 1 10*3/uL (ref 0.1–1.0)
Monocytes Relative: 7.8 % (ref 3.0–12.0)
Neutro Abs: 7.9 10*3/uL — ABNORMAL HIGH (ref 1.4–7.7)
Neutrophils Relative %: 63.5 % (ref 43.0–77.0)
Platelets: 375 10*3/uL (ref 150.0–400.0)
RBC: 4.06 Mil/uL (ref 3.87–5.11)
RDW: 16.4 % — ABNORMAL HIGH (ref 11.5–15.5)
WBC: 12.5 10*3/uL — ABNORMAL HIGH (ref 4.0–10.5)

## 2021-05-27 LAB — HIGH SENSITIVITY CRP: CRP, High Sensitivity: 44.75 mg/L — ABNORMAL HIGH (ref 0.000–5.000)

## 2021-05-27 LAB — SEDIMENTATION RATE: Sed Rate: 98 mm/hr — ABNORMAL HIGH (ref 0–30)

## 2021-05-27 MED ORDER — ZOLPIDEM TARTRATE 5 MG PO TABS
5.0000 mg | ORAL_TABLET | Freq: Every evening | ORAL | 0 refills | Status: DC | PRN
  Filled 2021-05-27: qty 30, 30d supply, fill #0

## 2021-05-27 MED ORDER — CYANOCOBALAMIN 1000 MCG/ML IJ SOLN
1000.0000 ug | Freq: Once | INTRAMUSCULAR | Status: AC
  Administered 2021-05-27: 1000 ug via INTRAMUSCULAR

## 2021-05-27 MED FILL — Flecainide Acetate Tab 50 MG: ORAL | 90 days supply | Qty: 180 | Fill #0 | Status: AC

## 2021-05-27 MED FILL — Rivaroxaban Tab 20 MG: ORAL | 90 days supply | Qty: 90 | Fill #1 | Status: AC

## 2021-05-27 NOTE — Patient Instructions (Signed)
We will check the labs today.  We have given you the B12 shot.  We have sent in the ambien to use for sleep.

## 2021-05-27 NOTE — Progress Notes (Signed)
   Subjective:   Patient ID: Monique Clark, female    DOB: 09/20/1966, 55 y.o.   MRN: [id number]  HPI The patient is a 55 YO female coming in for follow up and new insomnia.   Review of Systems  Constitutional: Negative.   HENT: Negative.    Eyes: Negative.   Respiratory:  Negative for cough, chest tightness and shortness of breath.   Cardiovascular:  Negative for chest pain, palpitations and leg swelling.  Gastrointestinal:  Negative for abdominal distention, abdominal pain, constipation, diarrhea, nausea and vomiting.  Musculoskeletal: Negative.   Skin: Negative.   Neurological: Negative.   Psychiatric/Behavioral:  Positive for sleep disturbance.    Objective:  Physical Exam Constitutional:      Appearance: She is well-developed. She is obese.  HENT:     Head: Normocephalic and atraumatic.  Cardiovascular:     Rate and Rhythm: Normal rate and regular rhythm.  Pulmonary:     Effort: Pulmonary effort is normal. No respiratory distress.     Breath sounds: Normal breath sounds. No wheezing or rales.  Abdominal:     General: Bowel sounds are normal. There is no distension.     Palpations: Abdomen is soft.     Tenderness: There is no abdominal tenderness. There is no rebound.  Musculoskeletal:     Cervical back: Normal range of motion.  Skin:    General: Skin is warm and dry.  Neurological:     Mental Status: She is alert and oriented to person, place, and time.     Coordination: Coordination normal.    Vitals:   05/27/21 1416  BP: 130/80  Pulse: 75  Resp: 18  Temp: 98.7 F (37.1 C)  TempSrc: Oral  SpO2: 98%  Weight: (!) 323 lb (146.5 kg)  Height: '5\' 4"'$  (1.626 m)    This visit occurred during the SARS-CoV-2 public health emergency.  Safety protocols were in place, including screening questions prior to the visit, additional usage of staff PPE, and extensive cleaning of exam room while observing appropriate contact time as indicated for disinfecting solutions.    Assessment & Plan:

## 2021-05-28 ENCOUNTER — Ambulatory Visit (INDEPENDENT_AMBULATORY_CARE_PROVIDER_SITE_OTHER): Payer: 59

## 2021-05-28 ENCOUNTER — Ambulatory Visit: Payer: 59 | Admitting: Orthopaedic Surgery

## 2021-05-28 ENCOUNTER — Encounter: Payer: Self-pay | Admitting: Internal Medicine

## 2021-05-28 ENCOUNTER — Other Ambulatory Visit (INDEPENDENT_AMBULATORY_CARE_PROVIDER_SITE_OTHER): Payer: 59

## 2021-05-28 ENCOUNTER — Encounter: Payer: Self-pay | Admitting: Orthopaedic Surgery

## 2021-05-28 ENCOUNTER — Other Ambulatory Visit (HOSPITAL_COMMUNITY): Payer: Self-pay

## 2021-05-28 VITALS — BP 146/94 | HR 70 | Ht 64.0 in | Wt 323.0 lb

## 2021-05-28 DIAGNOSIS — M5441 Lumbago with sciatica, right side: Secondary | ICD-10-CM

## 2021-05-28 DIAGNOSIS — M545 Low back pain, unspecified: Secondary | ICD-10-CM | POA: Diagnosis not present

## 2021-05-28 DIAGNOSIS — Z7689 Persons encountering health services in other specified circumstances: Secondary | ICD-10-CM | POA: Diagnosis not present

## 2021-05-28 DIAGNOSIS — Z8572 Personal history of non-Hodgkin lymphomas: Secondary | ICD-10-CM

## 2021-05-28 DIAGNOSIS — M353 Polymyalgia rheumatica: Secondary | ICD-10-CM

## 2021-05-28 DIAGNOSIS — G4700 Insomnia, unspecified: Secondary | ICD-10-CM | POA: Insufficient documentation

## 2021-05-28 DIAGNOSIS — Z96643 Presence of artificial hip joint, bilateral: Secondary | ICD-10-CM

## 2021-05-28 DIAGNOSIS — M25551 Pain in right hip: Secondary | ICD-10-CM

## 2021-05-28 DIAGNOSIS — M79604 Pain in right leg: Secondary | ICD-10-CM

## 2021-05-28 MED ORDER — PREDNISONE 20 MG PO TABS
20.0000 mg | ORAL_TABLET | Freq: Every day | ORAL | 0 refills | Status: DC
  Filled 2021-05-28: qty 14, 14d supply, fill #0

## 2021-05-28 MED ORDER — METHOCARBAMOL 750 MG PO TABS
ORAL_TABLET | Freq: Four times a day (QID) | ORAL | 1 refills | Status: DC | PRN
  Filled 2021-05-28: qty 120, 30d supply, fill #0
  Filled 2021-07-15: qty 120, 30d supply, fill #1

## 2021-05-28 NOTE — Assessment & Plan Note (Signed)
Has used ambien rarely over the years and it works well. She has problems initiating sleep but stays asleep well. Counseled about risk of dependence, abuse, falls, memory changes and she wishes to proceed with short course. Rx ambien 5 mg qhs prn.

## 2021-05-28 NOTE — Assessment & Plan Note (Signed)
Checking CRP and ESR and CBC with diff today. She had very elevated CRP recently.

## 2021-05-28 NOTE — Progress Notes (Signed)
Office Visit Note   Patient: Monique Clark           Date of Birth: 12/23/1965           MRN: [id number] Visit Date: 05/28/2021              Requested by: Hoyt Koch, MD [address] [place],  [place] [address] PCP: Hoyt Koch, MD   Assessment & Plan: Visit Diagnoses:  1. Acute right-sided low back pain with right-sided sciatica   2. Pain in right leg   3. Pain in right hip   4. Low back pain radiating to right lower extremity   5. History of bilateral total hip arthroplasty     Plan: Patient needs reimaging studies lumbar spine she is failed piriformis injection physical therapy with persistent symptoms.  Total of arthroplasties look good.  Follow-Up Instructions: No follow-ups on file.   Orders:  Orders Placed This Encounter  Procedures   XR Lumbar Spine 2-3 Views   XR HIP UNILAT W OR W/O PELVIS 2-3 VIEWS RIGHT   MR Lumbar Spine w/o contrast   No orders of the defined types were placed in this encounter.     Procedures: No procedures performed   Clinical Data: No additional findings.   Subjective: Chief Complaint  Patient presents with   Lower Back - Pain    HPI 55 year old female with ongoing problems with back pain and right hip pain that radiates down past her knee.  She has had a history of piriformis injection 03/04/2021 May have gotten some temporary relief.  Previous total hip arthroplasties done at in [place] due to BMI at 55 requiring her to travel out of state to have the procedure.  She had history of non-Hodgkin's lymphoma prediabetes and hypertension as well as atrial fibs of note.  Hip arthroplasties are doing well she states he has pain laterally over the trochanter some pain in the thigh.  She tripped over her dog recently scraped her knee but does not think she aggravated her problem.  She has been treated with Robaxin, Tylenol also tramadol.  Currently in physical therapy after total of arthroplasty  10/24/2020.  Lumbar MRI 02/13/2020 showed disc degeneration L2-3 through L5-S1 with significant facet arthropathy.  Pronounced facet degenerative changes at L3-4 and L4-5 with lateral recess stenosis noted and 1 mm anterolisthesis at L4-5.  She has had progression of her symptoms since last year more pain failed epidurals.  In the past she had multiple epidurals and had avascular necrosis requiring left total of arthroplasty.    Review of Systems all other systems are noncontributory to HPI.   Objective: Vital Signs: BP (!) 146/94   Pulse 70   Ht '5\' 4"'$  (D34-534 m)   Wt (!) 323 lb (146.5 kg)   BMI 55.44 kg/m   Physical Exam Constitutional:      Appearance: She is well-developed.  HENT:     Head: Normocephalic.     Right Ear: External ear normal.     Left Ear: External ear normal. There is no impacted cerumen.  Eyes:     Pupils: Pupils are equal, round, and reactive to light.  Neck:     Thyroid: No thyromegaly.     Trachea: No tracheal deviation.  Cardiovascular:     Rate and Rhythm: Normal rate.  Pulmonary:     Effort: Pulmonary effort is normal.  Abdominal:     Palpations: Abdomen is soft.  Musculoskeletal:  Cervical back: No rigidity.  Skin:    General: Skin is warm and dry.  Neurological:     Mental Status: She is alert and oriented to person, place, and time.  Psychiatric:        Behavior: Behavior normal.    Ortho Exam patient is amatory with a mild limp.  Direct lateral approach for hip arthroplasties.  Tenderness over the trochanter some sciatic notch tenderness some pain with straight leg raising. Reflex intact and symmetrical. Specialty Comments:  No specialty comments available.  Imaging:CLINICAL DATA:  Right gluteal pain radiating to the right leg in calf over the last 3-4 months.   EXAM: MRI LUMBAR SPINE WITHOUT CONTRAST   TECHNIQUE: Multiplanar, multisequence MR imaging of the lumbar spine was performed. No intravenous contrast was administered.    COMPARISON:  Radiography 01/30/2020   FINDINGS: Segmentation:  5 lumbar type vertebral bodies.   Alignment:  1 mm degenerative anterolisthesis L4-5.   Vertebrae: No fracture or primary bone lesion of significance. Few scattered benign appearing hemangiomas.   Conus medullaris and cauda equina: Conus extends to the L1-2 level. Conus and cauda equina appear normal.   Paraspinal and other soft tissues: Negative   Disc levels:   No significant disc or disc space finding at L1-2 or above.   L2-3: Mild desiccation and bulging of the disc. Mild bilateral facet osteoarthritis. No compressive stenosis.   L3-4: Moderate bulging of the disc. Mild facet and ligamentous hypertrophy. Narrowing of the lateral recesses left more than right. Mild facet edema which could be associated with back pain or referred facet syndrome pain.   L4-5: Moderate bulging of the disc. Bilateral facet osteoarthritis allowing 1 mm of anterolisthesis. Mild stenosis of both lateral recesses. Mild facet edema which could be associated with back pain or referred facet syndrome pain.   L5-S1: Shallow disc protrusion in the left posterolateral direction. Facet and ligamentous hypertrophy. Narrowing of both subarticular lateral recesses left more than right. Some potential to affect either S1 nerve, particularly the left. Mild facet edema could be associated with back pain or referred facet syndrome pain.   IMPRESSION: Degenerative disc disease and degenerative facet disease in the lumbar spine from L2-3 through L5-S1. Throughout the region, the facet arthropathy could be associated with back pain or referred facet syndrome pain, joints showing most pronounced involvement at L3-4 and L4-5. Disc bulges throughout the region contribute to lateral recess stenosis that could potentially be symptomatic, particularly at the L4-5 and L5-S1 levels.     Electronically Signed   By: Nelson Chimes M.D.   On: 02/13/2020  08:01      PMFS History: Patient Active Problem List   Diagnosis Date Noted   Insomnia 05/28/2021   Elevated serum hCG 07/02/2020   Paroxysmal atrial fibrillation (HCC)    Localized swelling of left lower extremity    Lightheadedness    Essential hypertension    History of bilateral total hip arthroplasty 07/29/2018   Morbid obesity with body mass index (BMI) of 50.0 to 59.9 in adult [facility]) 12/28/2015   CVID (common variable immunodeficiency) ([place]) 05/07/2015   Post-nasal discharge 03/30/2013   Prediabetes 01/20/2013   S/P splenectomy 01/20/2013   History of non-Hodgkin's lymphoma 12/21/2012   Low back pain radiating to right lower extremity 12/10/2012   Right lumbar radiculitis 12/10/2012   Past Medical History:  Diagnosis Date   Abscess    Arthritis    Atrial fibrillation [facility])    Cardiac arrhythmia due to congenital heart disease  CVID (common variable immunodeficiency) ([place]) 2015   Diverticulosis    Elevated cholesterol    High blood pressure    Non Hodgkin's lymphoma (HCC)    Obesity    Pneumonia    POTS (postural orthostatic tachycardia syndrome)    UTI (urinary tract infection)     Family History  Problem Relation Age of Onset   Atrial fibrillation Mother    Cancer Father        Colon   Atrial fibrillation Maternal Uncle    Atrial fibrillation Maternal Grandmother    Atrial fibrillation Maternal Grandfather    Heart attack Maternal Grandfather    Ovarian cancer Neg Hx     Past Surgical History:  Procedure Laterality Date   ABDOMINAL HYSTERECTOMY     APPENDECTOMY     CESAREAN SECTION     GALLBLADDER SURGERY     JOINT REPLACEMENT Left    hip   JOINT REPLACEMENT Right    Right Hip 2022   left shoulder repair     LUNG REMOVAL, PARTIAL     myringostomy Bilateral    SPLENECTOMY, TOTAL     TONSILLECTOMY AND ADENOIDECTOMY     Social History   Occupational History   Occupation: rn  Tobacco Use   Smoking status: Former    Packs/day: 0.50     Types: Cigarettes    Quit date: 09/29/2002    Years since quitting: 18.6   Smokeless tobacco: Never  Vaping Use   Vaping Use: Never used  Substance and Sexual Activity   Alcohol use: No   Drug use: No   Sexual activity: Yes    Birth control/protection: Surgical, Post-menopausal    Comment: 1st intercourse 37 yo-5 partners

## 2021-05-29 DIAGNOSIS — M5441 Lumbago with sciatica, right side: Secondary | ICD-10-CM | POA: Diagnosis not present

## 2021-05-29 DIAGNOSIS — R262 Difficulty in walking, not elsewhere classified: Secondary | ICD-10-CM | POA: Diagnosis not present

## 2021-05-29 DIAGNOSIS — I4891 Unspecified atrial fibrillation: Secondary | ICD-10-CM | POA: Diagnosis not present

## 2021-05-29 DIAGNOSIS — I10 Essential (primary) hypertension: Secondary | ICD-10-CM | POA: Diagnosis not present

## 2021-05-29 DIAGNOSIS — M25551 Pain in right hip: Secondary | ICD-10-CM | POA: Diagnosis not present

## 2021-05-29 DIAGNOSIS — I340 Nonrheumatic mitral (valve) insufficiency: Secondary | ICD-10-CM | POA: Diagnosis not present

## 2021-05-29 DIAGNOSIS — R609 Edema, unspecified: Secondary | ICD-10-CM | POA: Diagnosis not present

## 2021-05-29 DIAGNOSIS — M6281 Muscle weakness (generalized): Secondary | ICD-10-CM | POA: Diagnosis not present

## 2021-05-29 DIAGNOSIS — I951 Orthostatic hypotension: Secondary | ICD-10-CM | POA: Diagnosis not present

## 2021-05-29 LAB — CK: Total CK: 76 U/L (ref 7–177)

## 2021-05-30 LAB — TISSUE TRANSGLUTAMINASE, IGA: (tTG) Ab, IgA: <1 U/mL

## 2021-05-30 LAB — PROTEIN ELECTROPHORESIS, SERUM
Albumin ELP: 3.4 g/dL — ABNORMAL LOW (ref 3.8–4.8)
Alpha 1: 0.4 g/dL — ABNORMAL HIGH (ref 0.2–0.3)
Alpha 2: 1 g/dL — ABNORMAL HIGH (ref 0.5–0.9)
Beta 2: 0.3 g/dL (ref 0.2–0.5)
Beta Globulin: 0.5 g/dL (ref 0.4–0.6)
Gamma Globulin: 0.9 g/dL (ref 0.8–1.7)
Total Protein: 6.5 g/dL (ref 6.1–8.1)

## 2021-05-30 LAB — RETICULIN ANTIBODIES, IGA W TITER: Reticulin Ab, IgA: NEGATIVE titer (ref <2.5)

## 2021-05-30 LAB — ANA, IFA COMPREHENSIVE PANEL
Anti Nuclear Antibody (ANA): NEGATIVE
ENA SM Ab Ser-aCnc: <1 AI
SM/RNP: <1 AI
SSA (Ro) (ENA) Antibody, IgG: <1 AI
SSB (La) (ENA) Antibody, IgG: <1 AI
Scleroderma (Scl-70) (ENA) Antibody, IgG: <1 AI
ds DNA Ab: <1 IU/mL

## 2021-05-30 LAB — RHEUMATOID FACTOR: Rheumatoid fact SerPl-aCnc: <14 IU/mL (ref <14)

## 2021-05-30 LAB — GLIADIN ANTIBODIES, SERUM
Gliadin IgA: <1 U/mL
Gliadin IgG: 1 U/mL

## 2021-06-02 DIAGNOSIS — D801 Nonfamilial hypogammaglobulinemia: Secondary | ICD-10-CM | POA: Diagnosis not present

## 2021-06-05 DIAGNOSIS — M6281 Muscle weakness (generalized): Secondary | ICD-10-CM | POA: Diagnosis not present

## 2021-06-05 DIAGNOSIS — M25551 Pain in right hip: Secondary | ICD-10-CM | POA: Diagnosis not present

## 2021-06-05 DIAGNOSIS — R262 Difficulty in walking, not elsewhere classified: Secondary | ICD-10-CM | POA: Diagnosis not present

## 2021-06-05 DIAGNOSIS — M5441 Lumbago with sciatica, right side: Secondary | ICD-10-CM | POA: Diagnosis not present

## 2021-06-13 ENCOUNTER — Encounter: Payer: Self-pay | Admitting: Orthopaedic Surgery

## 2021-06-14 ENCOUNTER — Other Ambulatory Visit: Payer: Self-pay

## 2021-06-14 ENCOUNTER — Ambulatory Visit
Admission: RE | Admit: 2021-06-14 | Discharge: 2021-06-14 | Disposition: A | Discharge status: Home / Self Care | Payer: 59 | Source: Ambulatory Visit | Attending: Orthopaedic Surgery | Admitting: Orthopaedic Surgery

## 2021-06-14 ENCOUNTER — Other Ambulatory Visit (HOSPITAL_COMMUNITY): Payer: Self-pay

## 2021-06-14 ENCOUNTER — Encounter: Payer: Self-pay | Admitting: Internal Medicine

## 2021-06-14 DIAGNOSIS — M5441 Lumbago with sciatica, right side: Secondary | ICD-10-CM

## 2021-06-14 DIAGNOSIS — M5127 Other intervertebral disc displacement, lumbosacral region: Secondary | ICD-10-CM | POA: Diagnosis not present

## 2021-06-14 DIAGNOSIS — M25551 Pain in right hip: Secondary | ICD-10-CM

## 2021-06-14 DIAGNOSIS — M79604 Pain in right leg: Secondary | ICD-10-CM

## 2021-06-14 DIAGNOSIS — M48061 Spinal stenosis, lumbar region without neurogenic claudication: Secondary | ICD-10-CM | POA: Diagnosis not present

## 2021-06-14 MED ORDER — PREDNISONE 20 MG PO TABS
20.0000 mg | ORAL_TABLET | Freq: Every day | ORAL | 0 refills | Status: DC
  Filled 2021-06-14: qty 30, 30d supply, fill #0

## 2021-06-14 MED ORDER — PREDNISONE 20 MG PO TABS: 20.0000 mg | ORAL_TABLET | Freq: Every day | ORAL | 0 refills | Status: DC

## 2021-06-26 ENCOUNTER — Encounter: Payer: Self-pay | Admitting: Orthopaedic Surgery

## 2021-06-26 ENCOUNTER — Other Ambulatory Visit: Payer: Self-pay

## 2021-06-26 ENCOUNTER — Ambulatory Visit: Payer: 59 | Admitting: Orthopaedic Surgery

## 2021-06-26 VITALS — BP 155/84 | Ht 64.5 in | Wt 295.0 lb

## 2021-06-26 DIAGNOSIS — M5441 Lumbago with sciatica, right side: Secondary | ICD-10-CM

## 2021-06-26 DIAGNOSIS — M5116 Intervertebral disc disorders with radiculopathy, lumbar region: Secondary | ICD-10-CM

## 2021-06-26 NOTE — Progress Notes (Signed)
Office Visit Note   Patient: Monique Clark           Date of Birth: 1966/09/13           MRN: [id number] Visit Date: 06/26/2021              Requested by: Hoyt Koch, MD [address] [place],  [place] [address] PCP: Hoyt Koch, MD   Assessment & Plan: Visit Diagnoses:  1. Acute right-sided low back pain with right-sided sciatica   2. Lumbar disc herniation with radiculopathy     Plan: We will set patient up for a single epidural if not effective we can proceed with microdiscectomy.  MRI scan was reviewed we looked at old scans plain radiographs discussed pathophysiology discussed operative technique options of epidural versus proceeding with surgery.  Follow-up after epidural injection.  She currently is taking prednisone daily and states she does notice that this is helped her pain to some degree.  Hopefully should get relief with the epidural injection.  She also proceed with looking into bariatric treatment options.  Follow-Up Instructions: Return in about 4 weeks (around 07/24/2021).   Orders:  Orders Placed This Encounter  Procedures   Ambulatory referral to Physical Medicine Rehab   No orders of the defined types were placed in this encounter.     Procedures: No procedures performed   Clinical Data: No additional findings.   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  She has some discomfort in the left buttocks but primarily right side.  New MRI scan has been obtained.  She is used Advil without relief.  Past history of disc protrusion.  New MRI from 06/14/2021 shows inferior right disc extrusion at L4-5 further impacting the traversing right L5 nerve root.  Moderate central stenosis at L4-5 is  progressed.  She has elevated BMI has been through weight loss clinic and is interested in looking at bariatric procedure to help unload her back to some degree.  She had history of non-Hodgkin's lymphoma no evidence of metastatic disease in the lumbar spine.  Positive for diabetes hypertension and previous total hip arthroplasties as previously noted.  Review of Systems 14 point system unchanged from 05/28/2021 office visit.   Objective: Vital Signs: BP (!) 155/84   Ht 5' 4.5" (1.638 m)   Wt 295 lb (133.8 kg)   BMI 49.85 kg/m   Physical Exam Constitutional:      Appearance: She is well-developed.  HENT:     Head: Normocephalic.     Right Ear: External ear normal.     Left Ear: External ear normal. There is no impacted cerumen.  Eyes:     Pupils: Pupils are equal, round, and reactive to light.  Neck:     Thyroid: No thyromegaly.     Trachea: No tracheal deviation.  Cardiovascular:     Rate and Rhythm: Normal rate.  Pulmonary:     Effort: Pulmonary effort is normal.  Abdominal:     Palpations: Abdomen is soft.  Musculoskeletal:     Cervical back: No rigidity.  Skin:    General: Skin is warm and dry.  Neurological:     Mental Status: She is alert and oriented  to person, place, and time.  Psychiatric:        Behavior: Behavior normal.    Ortho Exam patient  with straight leg raising positive at 70 degrees on the right negative on the left.  She  gets from sitting  to standing slowly with effort , walks with slight limp has some trochanteric bursal tenderness some sciatic notch tenderness.  Slight weakness anterior tib EHL on the right normal on the left.  Decreased sensation dorsum of the right foot.  Specialty Comments:  No specialty comments available.  Imaging: CLINICAL DATA:  Low back pain for over 6 weeks. New onset right-sided low back pain extending into the right lower extremity.   EXAM: MRI LUMBAR SPINE WITHOUT CONTRAST   TECHNIQUE: Multiplanar, multisequence  MR imaging of the lumbar spine was performed. No intravenous contrast was administered.   COMPARISON:  MRI of the lumbar spine in 02/12/2020   FINDINGS: Segmentation: 5 non rib-bearing lumbar type vertebral bodies are present. The lowest fully formed vertebral body is L5.   Alignment: Slight retrolisthesis at L4-5 is stable. No other significant listhesis is present. Lumbar lordosis preserved.   Vertebrae: Chronic fatty endplate changes lungs for endplates of L3 and L4 stable. Regio mid L2 is stable. Hemangioma along the superior aspect of L1 is stable. Marrow signal and vertebral body heights are otherwise normal.   Conus medullaris and cauda equina: Conus extends to the L1-2 level. Conus and cauda equina appear normal.   Paraspinal and other soft tissues: Limited imaging the abdomen is unremarkable. There is no significant adenopathy. No solid organ lesions are present.   Disc levels:   L1-2: Normal   L2-3: Mild disc bulging and facet hypertrophy is present without significant stenosis.   L3-4: A broad-based disc protrusion is asymmetric to the left. Moderate facet hypertrophy noted. Mild left subarticular and foraminal narrowing is stable.   L4-5: A broad-based disc protrusion is present. Inferior right disc extrusion is new, further impacting the traversing right L5 nerve roots. Moderate central canal narrowing has progressed. Mild foraminal narrowing bilaterally is stable.   L5-S1: Central annular tear is present. Shallow protrusion. Moderate facet hypertrophy noted bilaterally. Mild subarticular narrowing, left greater than right demonstrates slight progression. Foramina are patent.   IMPRESSION: 1. New inferior right disc extrusion at L4-5 further impacting the traversing right L5 nerve roots. 2. Moderate central canal narrowing at L4-5 has progressed. 3. Mild foraminal narrowing bilaterally at L4-5 is stable. 4. Mild left subarticular and foraminal narrowing  at L3-4 is stable. 5. Central annular tear and moderate facet hypertrophy at L5-S1 with slight progression of mild subarticular narrowing, left greater than right.     Electronically Signed   By: San Morelle M.D.   On: 06/14/2021 15:09   PMFS History: Patient Active Problem List   Diagnosis Date Noted   Lumbar disc herniation with radiculopathy 06/26/2021   Insomnia 05/28/2021   Elevated serum hCG 07/02/2020   Paroxysmal atrial fibrillation (HCC)    Localized swelling of left lower extremity    Lightheadedness    Essential hypertension    History of bilateral total hip arthroplasty 07/29/2018   Morbid obesity with body mass index (BMI) of 50.0 to 59.9 in adult [facility]) 12/28/2015   CVID (common variable immunodeficiency) ([place]) 05/07/2015   Post-nasal discharge 03/30/2013   Prediabetes 01/20/2013   S/P splenectomy 01/20/2013   History of non-Hodgkin's lymphoma 12/21/2012   Low back pain radiating to right lower extremity 12/10/2012   Right lumbar radiculitis  12/10/2012   Past Medical History:  Diagnosis Date   Abscess    Arthritis    Atrial fibrillation ([place])    Cardiac arrhythmia due to congenital heart disease    CVID (common variable immunodeficiency) ([place]) 2015   Diverticulosis    Elevated cholesterol    High blood pressure    Non Hodgkin's lymphoma (HCC)    Obesity    Pneumonia    POTS (postural orthostatic tachycardia syndrome)    UTI (urinary tract infection)     Family History  Problem Relation Age of Onset   Atrial fibrillation Mother    Cancer Father        Colon   Atrial fibrillation Maternal Uncle    Atrial fibrillation Maternal Grandmother    Atrial fibrillation Maternal Grandfather    Heart attack Maternal Grandfather    Ovarian cancer Neg Hx     Past Surgical History:  Procedure Laterality Date   ABDOMINAL HYSTERECTOMY     APPENDECTOMY     CESAREAN SECTION     GALLBLADDER SURGERY     JOINT REPLACEMENT Left    hip   JOINT  REPLACEMENT Right    Right Hip 2022   left shoulder repair     LUNG REMOVAL, PARTIAL     myringostomy Bilateral    SPLENECTOMY, TOTAL     TONSILLECTOMY AND ADENOIDECTOMY     Social History   Occupational History   Occupation: rn  Tobacco Use   Smoking status: Former    Packs/day: 0.50    Types: Cigarettes    Quit date: 09/29/2002    Years since quitting: 18.7   Smokeless tobacco: Never  Vaping Use   Vaping Use: Never used  Substance and Sexual Activity   Alcohol use: No   Drug use: No   Sexual activity: Yes    Birth control/protection: Surgical, Post-menopausal    Comment: 1st intercourse 20 yo-5 partners

## 2021-06-30 DIAGNOSIS — D801 Nonfamilial hypogammaglobulinemia: Secondary | ICD-10-CM | POA: Diagnosis not present

## 2021-07-01 ENCOUNTER — Other Ambulatory Visit (HOSPITAL_COMMUNITY): Payer: Self-pay

## 2021-07-01 ENCOUNTER — Other Ambulatory Visit: Payer: Self-pay | Admitting: Internal Medicine

## 2021-07-02 ENCOUNTER — Other Ambulatory Visit (HOSPITAL_COMMUNITY): Payer: Self-pay

## 2021-07-02 MED ORDER — UNIFINE PENTIPS 32G X 4 MM MISC
0 refills | Status: DC
  Filled 2021-07-02: qty 100, 90d supply, fill #0

## 2021-07-02 MED ORDER — ZOLPIDEM TARTRATE 5 MG PO TABS
5.0000 mg | ORAL_TABLET | Freq: Every evening | ORAL | 3 refills | Status: DC | PRN
  Filled 2021-07-02: qty 30, 30d supply, fill #0
  Filled 2021-08-06: qty 30, 30d supply, fill #1
  Filled 2021-08-26 – 2021-09-02 (×2): qty 30, 30d supply, fill #2
  Filled 2021-09-30: qty 30, 30d supply, fill #3

## 2021-07-03 DIAGNOSIS — M6281 Muscle weakness (generalized): Secondary | ICD-10-CM | POA: Diagnosis not present

## 2021-07-03 DIAGNOSIS — M25551 Pain in right hip: Secondary | ICD-10-CM | POA: Diagnosis not present

## 2021-07-03 DIAGNOSIS — M5441 Lumbago with sciatica, right side: Secondary | ICD-10-CM | POA: Diagnosis not present

## 2021-07-03 DIAGNOSIS — R262 Difficulty in walking, not elsewhere classified: Secondary | ICD-10-CM | POA: Diagnosis not present

## 2021-07-09 ENCOUNTER — Other Ambulatory Visit: Payer: Self-pay

## 2021-07-09 ENCOUNTER — Other Ambulatory Visit (HOSPITAL_COMMUNITY): Payer: Self-pay

## 2021-07-09 DIAGNOSIS — M5116 Intervertebral disc disorders with radiculopathy, lumbar region: Secondary | ICD-10-CM

## 2021-07-09 DIAGNOSIS — M5441 Lumbago with sciatica, right side: Secondary | ICD-10-CM

## 2021-07-14 ENCOUNTER — Other Ambulatory Visit: Payer: Self-pay | Admitting: Internal Medicine

## 2021-07-15 ENCOUNTER — Other Ambulatory Visit (HOSPITAL_COMMUNITY): Payer: Self-pay

## 2021-07-16 ENCOUNTER — Other Ambulatory Visit (HOSPITAL_COMMUNITY): Payer: Self-pay

## 2021-07-22 ENCOUNTER — Ambulatory Visit: Payer: 59 | Admitting: Physical Medicine and Rehabilitation

## 2021-07-22 DIAGNOSIS — I4891 Unspecified atrial fibrillation: Secondary | ICD-10-CM | POA: Diagnosis not present

## 2021-07-22 DIAGNOSIS — Z79899 Other long term (current) drug therapy: Secondary | ICD-10-CM | POA: Diagnosis not present

## 2021-07-22 DIAGNOSIS — I10 Essential (primary) hypertension: Secondary | ICD-10-CM | POA: Diagnosis not present

## 2021-07-22 DIAGNOSIS — Z888 Allergy status to other drugs, medicaments and biological substances status: Secondary | ICD-10-CM | POA: Diagnosis not present

## 2021-07-22 DIAGNOSIS — D801 Nonfamilial hypogammaglobulinemia: Secondary | ICD-10-CM | POA: Diagnosis not present

## 2021-07-22 DIAGNOSIS — Z96642 Presence of left artificial hip joint: Secondary | ICD-10-CM | POA: Diagnosis not present

## 2021-07-22 DIAGNOSIS — R7989 Other specified abnormal findings of blood chemistry: Secondary | ICD-10-CM | POA: Diagnosis not present

## 2021-07-22 DIAGNOSIS — Z887 Allergy status to serum and vaccine status: Secondary | ICD-10-CM | POA: Diagnosis not present

## 2021-07-22 DIAGNOSIS — Z7982 Long term (current) use of aspirin: Secondary | ICD-10-CM | POA: Diagnosis not present

## 2021-07-24 ENCOUNTER — Encounter: Payer: Self-pay | Admitting: Physical Medicine and Rehabilitation

## 2021-07-24 ENCOUNTER — Ambulatory Visit: Payer: Self-pay

## 2021-07-24 ENCOUNTER — Ambulatory Visit: Payer: 59 | Admitting: Physical Medicine and Rehabilitation

## 2021-07-24 ENCOUNTER — Other Ambulatory Visit: Payer: Self-pay

## 2021-07-24 VITALS — BP 161/87 | HR 76

## 2021-07-24 DIAGNOSIS — M5116 Intervertebral disc disorders with radiculopathy, lumbar region: Secondary | ICD-10-CM

## 2021-07-24 DIAGNOSIS — M5416 Radiculopathy, lumbar region: Secondary | ICD-10-CM | POA: Diagnosis not present

## 2021-07-24 MED ORDER — BETAMETHASONE SOD PHOS & ACET 6 (3-3) MG/ML IJ SUSP
12.0000 mg | Freq: Once | INTRAMUSCULAR | Status: AC
  Administered 2021-07-24: 12 mg

## 2021-07-24 NOTE — Procedures (Signed)
Lumbar Epidural Steroid Injection - Interlaminar Approach with Fluoroscopic Guidance  Patient: Monique Clark      Date of Birth: 1966/08/30 MRN: [id number] PCP: Hoyt Koch, MD      Visit Date: 07/24/2021   Universal Protocol:     Consent Given By: the patient  Position: PRONE  Additional Comments: Vital signs were monitored before and after the procedure. Patient was prepped and draped in the usual sterile fashion. No Chlorhexidine used. The correct patient, procedure, and site was verified.   Injection Procedure Details:   Procedure diagnoses: Lumbar radiculopathy [M54.16]   Meds Administered:  Meds ordered this encounter  Medications   betamethasone acetate-betamethasone sodium phosphate (CELESTONE) injection 12 mg     Laterality: Right  Location/Site:  L5-S1  Needle: 4.5 in., 20 ga. Tuohy  Needle Placement: Paramedian epidural  Findings:   -Comments: Excellent flow of contrast into the epidural space.  Procedure Details: Using a paramedian approach from the side mentioned above, the region overlying the inferior lamina was localized under fluoroscopic visualization and the soft tissues overlying this structure were infiltrated with 4 ml. of 1% Lidocaine without Epinephrine.  Initial right-sided spondylosis was noted with fluoroscopic imaging particularly with increased osteophyte at the lamina.  Using biplanar imaging the 20-gauge for 0.5 inch Touhy needle was positioned to the level of the lamina and we were just unable to obtain loss-of-resistance despite multiple repositioning and imaging.  Part of the problem is secondary to positioning on the table and the patient's body habitus.  At that point I did switch positions to the L5-S1 level and we obtained entry into the epidural space with loss-of-resistance using a paramedian approach very quickly and easily.  Medication should reach the same level.  The epidural space was localized using loss of  resistance along with counter oblique bi-planar fluoroscopic views.  After negative aspirate for air, blood, and CSF, a 2 ml. volume of Isovue-250 was injected into the epidural space and the flow of contrast was observed. Radiographs were obtained for documentation purposes.    The injectate was administered into the level noted above.   Additional Comments:  The patient tolerated the procedure well Dressing: 2 x 2 sterile gauze and Band-Aid    Post-procedure details: Patient was observed during the procedure. Post-procedure instructions were reviewed.  Patient left the clinic in stable condition.

## 2021-07-24 NOTE — Patient Instructions (Signed)

## 2021-07-24 NOTE — Progress Notes (Signed)
Pt state lower back pain that ravels down her right leg. Pt state walking, standing and laying down makes the pain worse. Pt state she takes pain meds to help ease her pain. Pt has hx of inj on 02/14/21 pt state it helped for a week.  Numeric Pain Rating Scale and Functional Assessment Average Pain 4   In the last MONTH (on 0-10 scale) has pain interfered with the following?  1. General activity like being  able to carry out your everyday physical activities such as walking, climbing stairs, carrying groceries, or moving a chair?  Rating(7)   +Driver, +BT, -Dye Allergies.

## 2021-07-24 NOTE — Progress Notes (Signed)
Monique Clark - 55 y.o. female MRN [id number]  Date of birth: [birth year]  Office Visit Note: Visit Date: 07/24/2021 PCP: Hoyt Koch, MD Referred by: Hoyt Koch, *  Subjective: Chief Complaint  Patient presents with   Lower Back - Pain   Right Leg - Pain   HPI:  Monique Clark is a 55 y.o. female who comes in today at the request of Dr. Rodell Perna for planned Right L4-5 Lumbar Interlaminar epidural steroid injection with fluoroscopic guidance.  The patient has failed conservative care including home exercise, medications, time and activity modification.  This injection will be diagnostic and hopefully therapeutic.  Please see requesting physician notes for further details and justification. MRI reviewed with images and spine model.  MRI reviewed in the note below.  Please see her report for the injection but we did end up completing a right L5-S1 interlaminar epidural steroid injection.  Chlorhexidine Allergy.  ROS Otherwise per HPI.  Assessment & Plan: Visit Diagnoses:    ICD-10-CM   1. Lumbar radiculopathy  M54.16 XR C-ARM NO REPORT    Epidural Steroid injection    betamethasone acetate-betamethasone sodium phosphate (CELESTONE) injection 12 mg    2. Radiculopathy due to lumbar intervertebral disc disorder  M51.16 XR C-ARM NO REPORT    Epidural Steroid injection    betamethasone acetate-betamethasone sodium phosphate (CELESTONE) injection 12 mg      Plan: No additional findings.   Meds & Orders:  Meds ordered this encounter  Medications   betamethasone acetate-betamethasone sodium phosphate (CELESTONE) injection 12 mg    Orders Placed This Encounter  Procedures   XR C-ARM NO REPORT   Epidural Steroid injection    Follow-up: Return for Rodell Perna, MD as scheduled.   Procedures: No procedures performed  Lumbar Epidural Steroid Injection - Interlaminar Approach with Fluoroscopic Guidance  Patient: Monique Clark      Date of Birth:  [birth year] MRN: [id number] PCP: Hoyt Koch, MD      Visit Date: 07/24/2021   Universal Protocol:     Consent Given By: the patient  Position: PRONE  Additional Comments: Vital signs were monitored before and after the procedure. Patient was prepped and draped in the usual sterile fashion. No Chlorhexidine used. The correct patient, procedure, and site was verified.   Injection Procedure Details:   Procedure diagnoses: Lumbar radiculopathy [M54.16]   Meds Administered:  Meds ordered this encounter  Medications   betamethasone acetate-betamethasone sodium phosphate (CELESTONE) injection 12 mg     Laterality: Right  Location/Site:  L5-S1  Needle: 4.5 in., 20 ga. Tuohy  Needle Placement: Paramedian epidural  Findings:   -Comments: Excellent flow of contrast into the epidural space.  Procedure Details: Using a paramedian approach from the side mentioned above, the region overlying the inferior lamina was localized under fluoroscopic visualization and the soft tissues overlying this structure were infiltrated with 4 ml. of 1% Lidocaine without Epinephrine.  Initial right-sided spondylosis was noted with fluoroscopic imaging particularly with increased osteophyte at the lamina.  Using biplanar imaging the 20-gauge for 0.5 inch Touhy needle was positioned to the level of the lamina and we were just unable to obtain loss-of-resistance despite multiple repositioning and imaging.  Part of the problem is secondary to positioning on the table and the patient's body habitus.  At that point I did switch positions to the L5-S1 level and we obtained entry into the epidural space with loss-of-resistance using a paramedian approach very quickly and easily.  Medication should reach  the same level.  The epidural space was localized using loss of resistance along with counter oblique bi-planar fluoroscopic views.  After negative aspirate for air, blood, and CSF, a 2 ml. volume of  Isovue-250 was injected into the epidural space and the flow of contrast was observed. Radiographs were obtained for documentation purposes.    The injectate was administered into the level noted above.   Additional Comments:  The patient tolerated the procedure well Dressing: 2 x 2 sterile gauze and Band-Aid    Post-procedure details: Patient was observed during the procedure. Post-procedure instructions were reviewed.  Patient left the clinic in stable condition.    Clinical History: MRI LUMBAR SPINE WITHOUT CONTRAST   TECHNIQUE: Multiplanar, multisequence MR imaging of the lumbar spine was performed. No intravenous contrast was administered.   COMPARISON:  MRI of the lumbar spine in 02/12/2020   FINDINGS: Segmentation: 5 non rib-bearing lumbar type vertebral bodies are present. The lowest fully formed vertebral body is L5.   Alignment: Slight retrolisthesis at L4-5 is stable. No other significant listhesis is present. Lumbar lordosis preserved.   Vertebrae: Chronic fatty endplate changes lungs for endplates of L3 and L4 stable. Regio mid L2 is stable. Hemangioma along the superior aspect of L1 is stable. Marrow signal and vertebral body heights are otherwise normal.   Conus medullaris and cauda equina: Conus extends to the L1-2 level. Conus and cauda equina appear normal.   Paraspinal and other soft tissues: Limited imaging the abdomen is unremarkable. There is no significant adenopathy. No solid organ lesions are present.   Disc levels:   L1-2: Normal   L2-3: Mild disc bulging and facet hypertrophy is present without significant stenosis.   L3-4: A broad-based disc protrusion is asymmetric to the left. Moderate facet hypertrophy noted. Mild left subarticular and foraminal narrowing is stable.   L4-5: A broad-based disc protrusion is present. Inferior right disc extrusion is new, further impacting the traversing right L5 nerve roots. Moderate central canal  narrowing has progressed. Mild foraminal narrowing bilaterally is stable.   L5-S1: Central annular tear is present. Shallow protrusion. Moderate facet hypertrophy noted bilaterally. Mild subarticular narrowing, left greater than right demonstrates slight progression. Foramina are patent.   IMPRESSION: 1. New inferior right disc extrusion at L4-5 further impacting the traversing right L5 nerve roots. 2. Moderate central canal narrowing at L4-5 has progressed. 3. Mild foraminal narrowing bilaterally at L4-5 is stable. 4. Mild left subarticular and foraminal narrowing at L3-4 is stable. 5. Central annular tear and moderate facet hypertrophy at L5-S1 with slight progression of mild subarticular narrowing, left greater than right.     Electronically Signed   By: San Morelle M.D.   On: 06/14/2021 15:09     Objective:  VS:  HT:    WT:   BMI:     BP:(!) 161/87  HR:76bpm  TEMP: ( )  RESP:  Physical Exam Vitals and nursing note reviewed.  Constitutional:      General: She is not in acute distress.    Appearance: Normal appearance. She is obese. She is not ill-appearing.  HENT:     Head: Normocephalic and atraumatic.     Right Ear: External ear normal.     Left Ear: External ear normal.  Eyes:     Extraocular Movements: Extraocular movements intact.  Cardiovascular:     Rate and Rhythm: Normal rate.     Pulses: Normal pulses.  Pulmonary:     Effort: Pulmonary effort is normal. No respiratory  distress.  Abdominal:     General: There is no distension.     Palpations: Abdomen is soft.  Musculoskeletal:        General: Tenderness present.     Cervical back: Neck supple.     Right lower leg: No edema.     Left lower leg: No edema.     Comments: Patient has good distal strength with no pain over the greater trochanters.  No clonus or focal weakness.  Skin:    Findings: No erythema, lesion or rash.  Neurological:     General: No focal deficit present.     Mental  Status: She is alert and oriented to person, place, and time.     Sensory: No sensory deficit.     Motor: No weakness or abnormal muscle tone.     Coordination: Coordination normal.  Psychiatric:        Mood and Affect: Mood normal.        Behavior: Behavior normal.     Imaging: No results found.

## 2021-07-28 DIAGNOSIS — D801 Nonfamilial hypogammaglobulinemia: Secondary | ICD-10-CM | POA: Diagnosis not present

## 2021-07-29 DIAGNOSIS — Z7982 Long term (current) use of aspirin: Secondary | ICD-10-CM | POA: Diagnosis not present

## 2021-07-29 DIAGNOSIS — I10 Essential (primary) hypertension: Secondary | ICD-10-CM | POA: Diagnosis not present

## 2021-07-29 DIAGNOSIS — D801 Nonfamilial hypogammaglobulinemia: Secondary | ICD-10-CM | POA: Diagnosis not present

## 2021-07-29 DIAGNOSIS — Z887 Allergy status to serum and vaccine status: Secondary | ICD-10-CM | POA: Diagnosis not present

## 2021-07-29 DIAGNOSIS — Z79899 Other long term (current) drug therapy: Secondary | ICD-10-CM | POA: Diagnosis not present

## 2021-07-29 DIAGNOSIS — I4891 Unspecified atrial fibrillation: Secondary | ICD-10-CM | POA: Diagnosis not present

## 2021-07-29 DIAGNOSIS — Z888 Allergy status to other drugs, medicaments and biological substances status: Secondary | ICD-10-CM | POA: Diagnosis not present

## 2021-07-29 DIAGNOSIS — R7989 Other specified abnormal findings of blood chemistry: Secondary | ICD-10-CM | POA: Diagnosis not present

## 2021-07-29 DIAGNOSIS — Z96642 Presence of left artificial hip joint: Secondary | ICD-10-CM | POA: Diagnosis not present

## 2021-07-31 ENCOUNTER — Encounter: Payer: Self-pay | Admitting: Orthopaedic Surgery

## 2021-07-31 ENCOUNTER — Other Ambulatory Visit: Payer: Self-pay

## 2021-07-31 ENCOUNTER — Ambulatory Visit (INDEPENDENT_AMBULATORY_CARE_PROVIDER_SITE_OTHER): Payer: 59 | Admitting: Orthopaedic Surgery

## 2021-07-31 VITALS — BP 132/81 | Ht 64.5 in | Wt 295.0 lb

## 2021-07-31 DIAGNOSIS — M5116 Intervertebral disc disorders with radiculopathy, lumbar region: Secondary | ICD-10-CM | POA: Diagnosis not present

## 2021-07-31 NOTE — Progress Notes (Signed)
Office Visit Note   Patient: Monique Clark           Date of Birth: 12-12-1965           MRN: [id number] Visit Date: 07/31/2021              Requested by: Hoyt Koch, MD [address] [place],  [place] [address] PCP: Hoyt Koch, MD   Assessment & Plan: Visit Diagnoses:  1. Lumbar disc herniation with radiculopathy     Plan: Patient has L4-5 inferior extrusion with moderate central narrowing, nerve root compression on the right side.  Positive nerve root tension signs with EHL weakness on the right only.  She has not gotten relief with epidurals.  Plan would be microdiscectomy removal of fragment.  She has ligamentum hypertrophy contributing to the lateral recess stenosis.  We discussed risk surgery, use the operative microscope overnight stay.  Risk surgery discussed including rerupture, need for potential fusion if she had progression of degeneration.  Dural tear recurrent disc herniation.  Questions were elicited and answered she understands request to proceed.  Follow-Up Instructions: No follow-ups on file.   Orders:  No orders of the defined types were placed in this encounter.  No orders of the defined types were placed in this encounter.     Procedures: No procedures performed   Clinical Data: No additional findings.   Subjective: Chief Complaint  Patient presents with   Lower Back - Pain, Follow-up    HPI 55 year old female returns post epidural injection 11/24/2020 with persistent low back pain.  Patient states there she is worse when she walks she is amatory with a cane.  When she was taking prednisone she got some temporary relief.  She has used Advil and also Tylenol.  No bowel or bladder symptoms.  BMI 49.  She is not able to be more active to lose weight.  Review of Systems positive history atrial fibrillation hypertension.  Previous total hip arthroplasties done in [place].   Objective: Vital Signs: BP 132/81   Ht 5' 4.5"  (1.638 m)   Wt 295 lb (133.8 kg)   BMI 49.85 kg/m   Physical Exam Constitutional:      Appearance: She is well-developed.  HENT:     Head: Normocephalic.     Right Ear: External ear normal.     Left Ear: External ear normal. There is no impacted cerumen.  Eyes:     Pupils: Pupils are equal, round, and reactive to light.  Neck:     Thyroid: No thyromegaly.     Trachea: No tracheal deviation.  Cardiovascular:     Rate and Rhythm: Normal rate.  Pulmonary:     Effort: Pulmonary effort is normal.  Abdominal:     Palpations: Abdomen is soft.  Musculoskeletal:     Cervical back: No rigidity.  Skin:    General: Skin is warm and dry.  Neurological:     Mental Status: She is alert and oriented to person, place, and time.  Psychiatric:        Behavior: Behavior normal.    Ortho Exam patient has some pain with straight leg raising on the right 70 degrees negative on the left.  Ambulatory heel toe gait minimal limp.  Mild trochanteric bursal tenderness right side only.  Anterior tib is strong no gastrocs atrophy.  Pedal pulse are 2+.  Patient has decreased sensation dorsum of the right foot.  EHL 1 grade weak on the right normal  on the left.  Peroneals are strong.  Anterior tib is symmetrical no calf atrophy.  Specialty Comments:  No specialty comments available.  Imaging: CLINICAL DATA:  Low back pain for over 6 weeks. New onset right-sided low back pain extending into the right lower extremity.   EXAM: MRI LUMBAR SPINE WITHOUT CONTRAST   TECHNIQUE: Multiplanar, multisequence MR imaging of the lumbar spine was performed. No intravenous contrast was administered.   COMPARISON:  MRI of the lumbar spine in 02/12/2020   FINDINGS: Segmentation: 5 non rib-bearing lumbar type vertebral bodies are present. The lowest fully formed vertebral body is L5.   Alignment: Slight retrolisthesis at L4-5 is stable. No other significant listhesis is present. Lumbar lordosis preserved.    Vertebrae: Chronic fatty endplate changes lungs for endplates of L3 and L4 stable. Regio mid L2 is stable. Hemangioma along the superior aspect of L1 is stable. Marrow signal and vertebral body heights are otherwise normal.   Conus medullaris and cauda equina: Conus extends to the L1-2 level. Conus and cauda equina appear normal.   Paraspinal and other soft tissues: Limited imaging the abdomen is unremarkable. There is no significant adenopathy. No solid organ lesions are present.   Disc levels:   L1-2: Normal   L2-3: Mild disc bulging and facet hypertrophy is present without significant stenosis.   L3-4: A broad-based disc protrusion is asymmetric to the left. Moderate facet hypertrophy noted. Mild left subarticular and foraminal narrowing is stable.   L4-5: A broad-based disc protrusion is present. Inferior right disc extrusion is new, further impacting the traversing right L5 nerve roots. Moderate central canal narrowing has progressed. Mild foraminal narrowing bilaterally is stable.   L5-S1: Central annular tear is present. Shallow protrusion. Moderate facet hypertrophy noted bilaterally. Mild subarticular narrowing, left greater than right demonstrates slight progression. Foramina are patent.   IMPRESSION: 1. New inferior right disc extrusion at L4-5 further impacting the traversing right L5 nerve roots. 2. Moderate central canal narrowing at L4-5 has progressed. 3. Mild foraminal narrowing bilaterally at L4-5 is stable. 4. Mild left subarticular and foraminal narrowing at L3-4 is stable. 5. Central annular tear and moderate facet hypertrophy at L5-S1 with slight progression of mild subarticular narrowing, left greater than right.     Electronically Signed   By: San Morelle M.D.   On: 06/14/2021 15:09     PMFS History: Patient Active Problem List   Diagnosis Date Noted   Lumbar disc herniation with radiculopathy 06/26/2021   Insomnia 05/28/2021    Elevated serum hCG 07/02/2020   Paroxysmal atrial fibrillation (HCC)    Localized swelling of left lower extremity    Lightheadedness    Essential hypertension    History of bilateral total hip arthroplasty 07/29/2018   Morbid obesity with body mass index (BMI) of 50.0 to 59.9 in adult [facility]) 12/28/2015   CVID (common variable immunodeficiency) ([place]) 05/07/2015   Post-nasal discharge 03/30/2013   Prediabetes 01/20/2013   S/P splenectomy 01/20/2013   History of non-Hodgkin's lymphoma 12/21/2012   Low back pain radiating to right lower extremity 12/10/2012   Right lumbar radiculitis 12/10/2012   Past Medical History:  Diagnosis Date   Abscess    Arthritis    Atrial fibrillation ([place])    Cardiac arrhythmia due to congenital heart disease    CVID (common variable immunodeficiency) ([place]) 2015   Diverticulosis    Elevated cholesterol    High blood pressure    Non Hodgkin's lymphoma ([place])    Obesity  Pneumonia    POTS (postural orthostatic tachycardia syndrome)    UTI (urinary tract infection)     Family History  Problem Relation Age of Onset   Atrial fibrillation Mother    Cancer Father        Colon   Atrial fibrillation Maternal Uncle    Atrial fibrillation Maternal Grandmother    Atrial fibrillation Maternal Grandfather    Heart attack Maternal Grandfather    Ovarian cancer Neg Hx     Past Surgical History:  Procedure Laterality Date   ABDOMINAL HYSTERECTOMY     APPENDECTOMY     CESAREAN SECTION     GALLBLADDER SURGERY     JOINT REPLACEMENT Left    hip   JOINT REPLACEMENT Right    Right Hip 2022   left shoulder repair     LUNG REMOVAL, PARTIAL     myringostomy Bilateral    SPLENECTOMY, TOTAL     TONSILLECTOMY AND ADENOIDECTOMY     Social History   Occupational History   Occupation: rn  Tobacco Use   Smoking status: Former    Packs/day: 0.50    Types: Cigarettes    Quit date: 09/29/2002    Years since quitting: 18.8   Smokeless tobacco: Never  Vaping Use    Vaping Use: Never used  Substance and Sexual Activity   Alcohol use: No   Drug use: No   Sexual activity: Yes    Birth control/protection: Surgical, Post-menopausal    Comment: 1st intercourse 78 yo-5 partners

## 2021-08-06 ENCOUNTER — Ambulatory Visit: Payer: 59 | Admitting: Internal Medicine

## 2021-08-06 ENCOUNTER — Other Ambulatory Visit (HOSPITAL_COMMUNITY): Payer: Self-pay

## 2021-08-11 ENCOUNTER — Encounter: Payer: Self-pay | Admitting: Orthopaedic Surgery

## 2021-08-14 DIAGNOSIS — J209 Acute bronchitis, unspecified: Secondary | ICD-10-CM | POA: Diagnosis not present

## 2021-08-19 ENCOUNTER — Other Ambulatory Visit (HOSPITAL_COMMUNITY): Payer: Self-pay

## 2021-08-19 ENCOUNTER — Ambulatory Visit: Payer: 59 | Admitting: Internal Medicine

## 2021-08-19 ENCOUNTER — Encounter: Payer: Self-pay | Admitting: Internal Medicine

## 2021-08-19 DIAGNOSIS — C8590 Non-Hodgkin lymphoma, unspecified, unspecified site: Secondary | ICD-10-CM | POA: Diagnosis not present

## 2021-08-19 DIAGNOSIS — D649 Anemia, unspecified: Secondary | ICD-10-CM | POA: Diagnosis not present

## 2021-08-19 DIAGNOSIS — R079 Chest pain, unspecified: Secondary | ICD-10-CM | POA: Diagnosis not present

## 2021-08-19 DIAGNOSIS — R918 Other nonspecific abnormal finding of lung field: Secondary | ICD-10-CM | POA: Diagnosis not present

## 2021-08-19 DIAGNOSIS — I480 Paroxysmal atrial fibrillation: Secondary | ICD-10-CM | POA: Diagnosis not present

## 2021-08-19 DIAGNOSIS — M79671 Pain in right foot: Secondary | ICD-10-CM | POA: Diagnosis not present

## 2021-08-19 DIAGNOSIS — Z9049 Acquired absence of other specified parts of digestive tract: Secondary | ICD-10-CM | POA: Diagnosis not present

## 2021-08-19 DIAGNOSIS — I10 Essential (primary) hypertension: Secondary | ICD-10-CM | POA: Diagnosis not present

## 2021-08-19 MED ORDER — FLECAINIDE ACETATE 100 MG PO TABS
ORAL_TABLET | ORAL | 3 refills | Status: DC
  Filled 2021-08-19: qty 180, 90d supply, fill #0
  Filled 2022-02-07: qty 180, 90d supply, fill #1
  Filled 2022-06-20: qty 180, 90d supply, fill #2

## 2021-08-24 ENCOUNTER — Other Ambulatory Visit (HOSPITAL_COMMUNITY): Payer: Self-pay

## 2021-08-24 MED FILL — Rivaroxaban Tab 20 MG: ORAL | 90 days supply | Qty: 90 | Fill #2 | Status: AC

## 2021-08-25 DIAGNOSIS — D801 Nonfamilial hypogammaglobulinemia: Secondary | ICD-10-CM | POA: Diagnosis not present

## 2021-08-26 ENCOUNTER — Other Ambulatory Visit (HOSPITAL_COMMUNITY): Payer: Self-pay

## 2021-08-26 ENCOUNTER — Telehealth (INDEPENDENT_AMBULATORY_CARE_PROVIDER_SITE_OTHER): Payer: 59 | Admitting: Internal Medicine

## 2021-08-26 DIAGNOSIS — I10 Essential (primary) hypertension: Secondary | ICD-10-CM | POA: Diagnosis not present

## 2021-08-26 DIAGNOSIS — I480 Paroxysmal atrial fibrillation: Secondary | ICD-10-CM

## 2021-08-26 DIAGNOSIS — M79604 Pain in right leg: Secondary | ICD-10-CM | POA: Diagnosis not present

## 2021-08-26 DIAGNOSIS — M545 Low back pain, unspecified: Secondary | ICD-10-CM | POA: Diagnosis not present

## 2021-08-26 MED ORDER — CYCLOBENZAPRINE HCL 10 MG PO TABS
10.0000 mg | ORAL_TABLET | Freq: Three times a day (TID) | ORAL | 0 refills | Status: DC | PRN
  Filled 2021-08-26: qty 60, 20d supply, fill #0

## 2021-08-26 MED FILL — Rivaroxaban Tab 20 MG: ORAL | 90 days supply | Qty: 90 | Fill #3 | Status: CN

## 2021-08-26 NOTE — Progress Notes (Signed)
Virtual Visit via Video Note  I connected with Monique Clark on 08/26/21 at 10:00 AM EST by a video enabled telemedicine application and verified that I am speaking with the correct person using two identifiers.  The patient and the provider were at separate locations throughout the entire encounter. Patient location: home, Provider location: work   I discussed the limitations of evaluation and management by telemedicine and the availability of in person appointments. The patient expressed understanding and agreed to proceed. The patient and the provider were the only parties present for the visit unless noted in HPI below.  History of Present Illness: The patient is a 56 y.o. female with visit for ER follow up A fib and low back pain possible surgery Dec 21st. Then she is thinking about gastric sleeve for weight loss as she does feel this is adversely impacting her health and she has struggled to lose weight on her own.   Observations/Objective: Appearance: normal, breathing appears normal, casual grooming, abdomen does not appear distended, mental status is A and O times 3  Assessment and Plan: See problem oriented charting  Follow Up Instructions: refill flexeril until upcoming surgery, she is medically cleared for surgery as low risk with no further testing  I discussed the assessment and treatment plan with the patient. The patient was provided an opportunity to ask questions and all were answered. The patient agreed with the plan and demonstrated an understanding of the instructions.   The patient was advised to call back or seek an in-person evaluation if the symptoms worsen or if the condition fails to improve as anticipated.  Hoyt Koch, MD

## 2021-08-28 ENCOUNTER — Encounter: Payer: Self-pay | Admitting: Internal Medicine

## 2021-08-28 NOTE — Assessment & Plan Note (Signed)
Refill flexeril today during visit and medically cleared for upcoming procedure/surgery without further testing from my standpoint and she is getting clearance from cardiology as well.

## 2021-08-28 NOTE — Assessment & Plan Note (Signed)
BP mildly elevated to 150s with uncontrolled pain. She has upcoming orthopedic procedure which will hopefully help with the pain. We will reassess regimen for BP after surgery. She is taking nebivolol 2.5 mg daily. She is asked to stop NSAIDs as we discussed that she should not take regularly with xarelto but also they can raise BP.

## 2021-08-28 NOTE — Assessment & Plan Note (Signed)
Recent episode of A fib with RVR and in ER and did convert with interventions. Cardiology has increased her flecainide to 100 mg BID. Her BP is running mildly high and likely this is due to uncontrolled pain. After upcoming orthopedic procedure once pain is controlled will reassess.

## 2021-08-30 ENCOUNTER — Other Ambulatory Visit (HOSPITAL_COMMUNITY): Payer: Self-pay

## 2021-09-02 ENCOUNTER — Other Ambulatory Visit (HOSPITAL_COMMUNITY): Payer: Self-pay

## 2021-09-02 ENCOUNTER — Other Ambulatory Visit: Payer: Self-pay

## 2021-09-02 ENCOUNTER — Telehealth: Payer: Self-pay | Admitting: Orthopaedic Surgery

## 2021-09-02 NOTE — Telephone Encounter (Signed)
[employer] forms received. To [employer]. 

## 2021-09-11 ENCOUNTER — Encounter: Payer: Self-pay | Admitting: Surgery

## 2021-09-11 ENCOUNTER — Other Ambulatory Visit: Payer: Self-pay

## 2021-09-11 ENCOUNTER — Telehealth: Payer: Self-pay | Admitting: Surgery

## 2021-09-11 ENCOUNTER — Ambulatory Visit (INDEPENDENT_AMBULATORY_CARE_PROVIDER_SITE_OTHER): Payer: 59 | Admitting: Surgery

## 2021-09-11 VITALS — BP 139/85 | HR 67

## 2021-09-11 DIAGNOSIS — M5116 Intervertebral disc disorders with radiculopathy, lumbar region: Secondary | ICD-10-CM

## 2021-09-11 NOTE — Telephone Encounter (Signed)
Received $25.00 cash and medical records release form from patient /   Forwarding to [employer] today 

## 2021-09-13 ENCOUNTER — Encounter: Payer: Self-pay | Admitting: Orthopaedic Surgery

## 2021-09-13 ENCOUNTER — Other Ambulatory Visit (HOSPITAL_COMMUNITY): Payer: Self-pay

## 2021-09-13 ENCOUNTER — Other Ambulatory Visit: Payer: Self-pay | Admitting: Orthopaedic Surgery

## 2021-09-13 ENCOUNTER — Encounter (HOSPITAL_COMMUNITY): Payer: Self-pay

## 2021-09-13 MED ORDER — TRAMADOL HCL 50 MG PO TABS
50.0000 mg | ORAL_TABLET | Freq: Two times a day (BID) | ORAL | 0 refills | Status: DC | PRN
  Filled 2021-09-13: qty 20, 10d supply, fill #0

## 2021-09-13 NOTE — Progress Notes (Unsigned)
ultram

## 2021-09-13 NOTE — Progress Notes (Signed)
Surgical Instructions    Your procedure is scheduled on 09/18/21.  Report to [facility] Main Entrance "A" at 10:30 A.M., then check in with the Admitting office.  Call this number if you have problems the morning of surgery:  [phone]   If you have any questions prior to your surgery date call [phone]: Open Monday-Friday 8am-4pm    Remember:  Do not eat after midnight the night before your surgery  You may drink clear liquids until 9:30 the morning of your surgery.   Clear liquids allowed are: Water, Non-Citrus Juices (without pulp), Carbonated Beverages, Clear Tea, Black Coffee ONLY (NO MILK, CREAM OR POWDERED CREAMER of any kind), and Gatorade    Take these medicines the morning of surgery with A SIP OF WATER: DULoxetine (CYMBALTA) flecainide (TAMBOCOR) gabapentin (NEURONTIN) nebivolol (BYSTOLIC)   If Needed: albuterol (VENTOLIN HFA) cyclobenzaprine (FLEXERIL) traMADol (ULTRAM)   Follow your surgeon's instructions on when to stop Xarelto.  If no instructions were given by your surgeon then you will need to call the office to get those instructions.      As of today, STOP taking any Aspirin (unless otherwise instructed by your surgeon) Aleve, Naproxen, Ibuprofen, Motrin, Advil, Goody's, BC's, all herbal medications, fish oil, and all vitamins.     After your COVID test   You are not required to quarantine however you are required to wear a well-fitting mask when you are out and around people not in your household.  If your mask becomes wet or soiled, replace with a new one.  Wash your hands often with soap and water for 20 seconds or clean your hands with an alcohol-based hand sanitizer that contains at least 60% alcohol.  Do not share personal items.  Notify your provider: if you are in close contact with someone who has COVID  or if you develop a fever of 100.4 or greater, sneezing, cough, sore throat, shortness of breath or body aches.          Do not  wear jewelry or makeup Do not wear lotions, powders, perfumes or deodorant. Do not shave 48 hours prior to surgery. Do not bring valuables to the hospital. DO Not wear nail polish, gel polish, artificial nails, or any other type of covering on natural nails including finger and toenails. If patients have artificial nails, gel coating, etc. that need to be removed by a nail salon, please have this removed prior to surgery or surgery may need to be canceled/delayed if the surgeon/ anesthesia feels like the patient is unable to be adequately monitored.             [facility] is not responsible for any belongings or valuables.  Do NOT Smoke (Tobacco/Vaping)  24 hours prior to your procedure  If you use a CPAP at night, you may bring your mask for your overnight stay.   Contacts, glasses, hearing aids, dentures or partials may not be worn into surgery, please bring cases for these belongings   For patients admitted to the hospital, discharge time will be determined by your treatment team.   Patients discharged the day of surgery will not be allowed to drive home, and someone needs to stay with them for 24 hours.  NO VISITORS WILL BE ALLOWED IN PRE-OP WHERE PATIENTS ARE PREPPED FOR SURGERY.  ONLY 1 SUPPORT PERSON MAY BE PRESENT IN THE WAITING ROOM WHILE YOU ARE IN SURGERY.  IF YOU ARE TO BE ADMITTED, ONCE YOU ARE IN YOUR ROOM YOU WILL  BE ALLOWED TWO (2) VISITORS. 1 (ONE) VISITOR MAY STAY OVERNIGHT BUT MUST ARRIVE TO THE ROOM BY 8pm.  Minor children may have two parents present. Special consideration for safety and communication needs will be reviewed on a case by case basis.  Special instructions:    Oral Hygiene is also important to reduce your risk of infection.  Remember - BRUSH YOUR TEETH THE MORNING OF SURGERY WITH YOUR REGULAR TOOTHPASTE   [facility]- Preparing For Surgery  Before surgery, you can play an important role. Because skin is not sterile, your skin needs to be as free of  germs as possible. You can reduce the number of germs on your skin by washing with CHG (chlorahexidine gluconate) Soap before surgery.  CHG is an antiseptic cleaner which kills germs and bonds with the skin to continue killing germs even after washing.     Please do not use if you have an allergy to CHG or antibacterial soaps. If your skin becomes reddened/irritated stop using the CHG.  Do not shave (including legs and underarms) for at least 48 hours prior to first CHG shower. It is OK to shave your face.  Please follow these instructions carefully.     Shower the NIGHT BEFORE SURGERY and the MORNING OF SURGERY with CHG Soap.   If you chose to wash your hair, wash your hair first as usual with your normal shampoo. After you shampoo, rinse your hair and body thoroughly to remove the shampoo.  Then [employer] and genitals (private parts) with your normal soap and rinse thoroughly to remove soap.  After that Use CHG Soap as you would any other liquid soap. You can apply CHG directly to the skin and wash gently with a scrungie or a clean washcloth.   Apply the CHG Soap to your body ONLY FROM THE NECK DOWN.  Do not use on open wounds or open sores. Avoid contact with your eyes, ears, mouth and genitals (private parts). Wash Face and genitals (private parts)  with your normal soap.   Wash thoroughly, paying special attention to the area where your surgery will be performed.  Thoroughly rinse your body with warm water from the neck down.  DO NOT shower/wash with your normal soap after using and rinsing off the CHG Soap.  Pat yourself dry with a CLEAN TOWEL.  Wear CLEAN PAJAMAS to bed the night before surgery  Place CLEAN SHEETS on your bed the night before your surgery  DO NOT SLEEP WITH PETS.   Day of Surgery:  Take a shower with CHG soap. Wear Clean/Comfortable clothing the morning of surgery Do not apply any deodorants/lotions.   Remember to brush your teeth WITH YOUR REGULAR  TOOTHPASTE.   Please read over the following fact sheets that you were given.

## 2021-09-13 NOTE — Telephone Encounter (Signed)
Can you please advise? Patient is scheduled for right L4-5 microdiscectomy on 09/18/2021.

## 2021-09-16 ENCOUNTER — Encounter (HOSPITAL_COMMUNITY): Payer: Self-pay

## 2021-09-16 ENCOUNTER — Encounter (HOSPITAL_COMMUNITY)
Admission: RE | Admit: 2021-09-16 | Discharge: 2021-09-16 | Disposition: A | Discharge status: Home / Self Care | Payer: 59 | Source: Ambulatory Visit | Attending: Orthopaedic Surgery | Admitting: Orthopaedic Surgery

## 2021-09-16 ENCOUNTER — Other Ambulatory Visit: Payer: Self-pay

## 2021-09-16 ENCOUNTER — Other Ambulatory Visit (HOSPITAL_COMMUNITY): Payer: 59

## 2021-09-16 DIAGNOSIS — K219 Gastro-esophageal reflux disease without esophagitis: Secondary | ICD-10-CM | POA: Diagnosis not present

## 2021-09-16 DIAGNOSIS — E119 Type 2 diabetes mellitus without complications: Secondary | ICD-10-CM | POA: Diagnosis not present

## 2021-09-16 DIAGNOSIS — Z90710 Acquired absence of both cervix and uterus: Secondary | ICD-10-CM | POA: Insufficient documentation

## 2021-09-16 DIAGNOSIS — Z01812 Encounter for preprocedural laboratory examination: Secondary | ICD-10-CM | POA: Insufficient documentation

## 2021-09-16 DIAGNOSIS — M5126 Other intervertebral disc displacement, lumbar region: Secondary | ICD-10-CM | POA: Diagnosis not present

## 2021-09-16 DIAGNOSIS — Z96643 Presence of artificial hip joint, bilateral: Secondary | ICD-10-CM | POA: Insufficient documentation

## 2021-09-16 DIAGNOSIS — Z7901 Long term (current) use of anticoagulants: Secondary | ICD-10-CM | POA: Diagnosis not present

## 2021-09-16 DIAGNOSIS — I10 Essential (primary) hypertension: Secondary | ICD-10-CM | POA: Diagnosis not present

## 2021-09-16 DIAGNOSIS — Z6841 Body Mass Index (BMI) 40.0 and over, adult: Secondary | ICD-10-CM | POA: Diagnosis not present

## 2021-09-16 DIAGNOSIS — Z01818 Encounter for other preprocedural examination: Secondary | ICD-10-CM

## 2021-09-16 DIAGNOSIS — Z87891 Personal history of nicotine dependence: Secondary | ICD-10-CM | POA: Insufficient documentation

## 2021-09-16 DIAGNOSIS — Z20822 Contact with and (suspected) exposure to covid-19: Secondary | ICD-10-CM | POA: Insufficient documentation

## 2021-09-16 DIAGNOSIS — I4891 Unspecified atrial fibrillation: Secondary | ICD-10-CM | POA: Insufficient documentation

## 2021-09-16 DIAGNOSIS — E7800 Pure hypercholesterolemia, unspecified: Secondary | ICD-10-CM | POA: Diagnosis not present

## 2021-09-16 DIAGNOSIS — Z9049 Acquired absence of other specified parts of digestive tract: Secondary | ICD-10-CM | POA: Insufficient documentation

## 2021-09-16 DIAGNOSIS — Z8572 Personal history of non-Hodgkin lymphomas: Secondary | ICD-10-CM | POA: Insufficient documentation

## 2021-09-16 DIAGNOSIS — Z79899 Other long term (current) drug therapy: Secondary | ICD-10-CM | POA: Insufficient documentation

## 2021-09-16 DIAGNOSIS — D839 Common variable immunodeficiency, unspecified: Secondary | ICD-10-CM | POA: Diagnosis not present

## 2021-09-16 DIAGNOSIS — Z9081 Acquired absence of spleen: Secondary | ICD-10-CM | POA: Insufficient documentation

## 2021-09-16 HISTORY — DX: Other specified postprocedural states: R11.2

## 2021-09-16 HISTORY — DX: Type 2 diabetes mellitus without complications: E11.9

## 2021-09-16 HISTORY — DX: Prediabetes: R73.03

## 2021-09-16 HISTORY — DX: Gastro-esophageal reflux disease without esophagitis: K21.9

## 2021-09-16 HISTORY — DX: Other specified postprocedural states: Z98.890

## 2021-09-16 LAB — CBC
HCT: 40.8 % (ref 36.0–46.0)
Hemoglobin: 12.6 g/dL (ref 12.0–15.0)
MCH: 27.3 pg (ref 26.0–34.0)
MCHC: 30.9 g/dL (ref 30.0–36.0)
MCV: 88.3 fL (ref 80.0–100.0)
Platelets: 397 10*3/uL (ref 150–400)
RBC: 4.62 MIL/uL (ref 3.87–5.11)
RDW: 17 % — ABNORMAL HIGH (ref 11.5–15.5)
WBC: 12.5 10*3/uL — ABNORMAL HIGH (ref 4.0–10.5)
nRBC: 0 % (ref 0.0–0.2)

## 2021-09-16 LAB — COMPREHENSIVE METABOLIC PANEL
ALT: 18 U/L (ref 0–44)
AST: 27 U/L (ref 15–41)
Albumin: 3.2 g/dL — ABNORMAL LOW (ref 3.5–5.0)
Alkaline Phosphatase: 70 U/L (ref 38–126)
Anion gap: 8 (ref 5–15)
BUN: 15 mg/dL (ref 6–20)
CO2: 27 mmol/L (ref 22–32)
Calcium: 9.5 mg/dL (ref 8.9–10.3)
Chloride: 102 mmol/L (ref 98–111)
Creatinine, Ser: 0.91 mg/dL (ref 0.44–1.00)
GFR, Estimated: >60 mL/min (ref >60)
Glucose, Bld: 90 mg/dL (ref 70–99)
Potassium: 4 mmol/L (ref 3.5–5.1)
Sodium: 137 mmol/L (ref 135–145)
Total Bilirubin: 0.4 mg/dL (ref 0.3–1.2)
Total Protein: 7.2 g/dL (ref 6.5–8.1)

## 2021-09-16 LAB — HEMOGLOBIN A1C
Hgb A1c MFr Bld: 5.7 % — ABNORMAL HIGH (ref 4.8–5.6)
Mean Plasma Glucose: 116.89 mg/dL

## 2021-09-16 LAB — SURGICAL PCR SCREEN
MRSA, PCR: NEGATIVE
Staphylococcus aureus: NEGATIVE

## 2021-09-16 LAB — GLUCOSE, CAPILLARY: Glucose-Capillary: 87 mg/dL (ref 70–99)

## 2021-09-16 NOTE — Progress Notes (Signed)
PCP: Pricilla Holm, MD Cardiologist: Laymond Purser, MD  EKG: 04/23/21 CXR: 04/22/21 ECHO: 08/15/20 Stress Test: 2016 per patient Cardiac Cath: denies  Fasting Blood Sugar- does not check glucose.  Pre-diabetic Checks Blood Sugar__0_ times a day  OSA/CPAP: No  ASA: No Blood Thinner: Last dose Xarwelto 09/16/21  Covid test 09/16/21 at PAT   Anesthesia Review: yes, cardiac history.  Requested Echo report from Dr. Berniece Pap, [place] in [place].  Patient denies shortness of breath, fever, cough, and chest pain at PAT appointment.  Patient verbalized understanding of instructions provided today at the PAT appointment.  Patient asked to review instructions at home and day of surgery.

## 2021-09-17 ENCOUNTER — Encounter (HOSPITAL_COMMUNITY): Payer: Self-pay

## 2021-09-17 ENCOUNTER — Encounter: Payer: Self-pay | Admitting: Orthopaedic Surgery

## 2021-09-17 LAB — SARS CORONAVIRUS 2 (TAT 6-24 HRS): SARS Coronavirus 2: NEGATIVE

## 2021-09-17 NOTE — Anesthesia Preprocedure Evaluation (Addendum)
Anesthesia Evaluation  Patient identified by MRN, date of birth, ID band Patient awake    Reviewed: Allergy & Precautions, NPO status , Patient's Chart, lab work & pertinent test results  History of Anesthesia Complications (+) PONV and history of anesthetic complications  Airway Mallampati: III  TM Distance: >3 FB Neck ROM: Full    Dental no notable dental hx. (+) Teeth Intact, Dental Advisory Given   Pulmonary former smoker,  Negative sleep study a few years ago    Pulmonary exam normal breath sounds clear to auscultation       Cardiovascular hypertension, Pt. on home beta blockers Normal cardiovascular exam+ dysrhythmias (xarelto) Atrial Fibrillation  Rhythm:Regular Rate:Normal  Echo 08/15/20 ([place]): Summary: Normal LV size with normal function.  The ejection fraction is 60%.  There is mild concentric hypertrophy. Normal right ventricular size with normal function. The left atrium is mildly dilated. The right ventricle is normal in size with normal function. The right atrium is normal. The mitral valve is normal with trace regurgitation. The aortic valve is normal and trileaflet. The tricuspid valve and pulmonic valve are not well seen. The aorta is normal.  The aortic root is normal.    Nuclear stress test 08/15/20 ([place]) Summary: 1.  Normal left ventricular systolic function with ejection fraction of 68%. 2.  No clear definite scintigraphic evidence of ischemia or scar.  CT Coronary/Calcium score 03/16/19: IMPRESSION: 1. Coronary calcium score of 4. This was 38 percentile for age and sex matched control. 2. Normal coronary origin with right dominance. 3. Study quality is affected by patient's size, however there is only minimal non-obstructive CAD, risk factor modification is recommended.    Neuro/Psych negative neurological ROS     GI/Hepatic Neg liver ROS, GERD  Controlled,   Endo/Other  diabetes, Well Controlled, Type 2Morbid obesity  Renal/GU negative Renal ROS     Musculoskeletal  (+) Arthritis , Osteoarthritis,  Herniated lumbar disc   Abdominal (+) + obese,   Peds  Hematology negative hematology ROS (+)   Anesthesia Other Findings xarelto- LD 12/18  Reproductive/Obstetrics                           Anesthesia Physical Anesthesia Plan  ASA: 3  Anesthesia Plan: General   Post-op Pain Management: Celebrex PO (pre-op) and Tylenol PO (pre-op)   Induction:   PONV Risk Score and Plan: 4 or greater and Ondansetron, Dexamethasone, Midazolam and Scopolamine patch - Pre-op  Airway Management Planned: Oral ETT  Additional Equipment:   Intra-op Plan:   Post-operative Plan: Extubation in OR  Informed Consent: I have reviewed the patients History and Physical, chart, labs and discussed the procedure including the risks, benefits and alternatives for the proposed anesthesia with the patient or authorized representative who has indicated his/her understanding and acceptance.     Dental advisory given  Plan Discussed with: Anesthesiologist and CRNA  Anesthesia Plan Comments: (PAT note written 09/17/2021 by Myra Gianotti, PA-C. )      Anesthesia Quick Evaluation

## 2021-09-17 NOTE — Progress Notes (Addendum)
Anesthesia Chart Review:  Case: [id number] Date/Time: 09/18/21 1215   Procedure: RIGHT L4-5 MICRODISCECTOMY   Anesthesia type: General   Pre-op diagnosis: right L4-5 herniated nucleus pulposus   Location: [place] [facility] OR   Surgeons: Marybelle Killings, MD       DISCUSSION: Patient is a 55 year old female scheduled for the above procedure.   History includes former smoker (quit 09/29/02), postoperative N/V, HTN, afib, POTS, DM2, non-Hodgkin's lymphoma (diagnosed 08/1997 after splenectomy), hypercholesterolemia, GERD, common variable immunodeficiency (diagnosed 2014, had reaction to IVIG 2015, started on Hizenta injections 2016), splenectomy (1998), hysterectomy, cholecystectomy, appendectomy, partial lung resection, joint replacement  (left THA 2019, right THA 2022).  BMI is consistent with morbid obesity.  Last PCP evaluation with Dr. Sharlet Salina on 08/28/21. She wrote, "...medically cleared for upcoming procedure/surgery without further testing from my standpoint and she is getting clearance from cardiology as well."  Known afib history, Seen at [facility] 10/19/20 for afib with RVR but converted on her ow metabolic work-up was normal. TSH 2.84.  Her Bystolic had recently been increased to 5 mg daily.  Also on Xarelto and flecainide.  She was advised follow-up with her primary cardiologist at [facility].  Cardiac clearance note signed by Davene Costain, FNP-C on 08/28/21. Given permission to hold Xarelto for 48 hours prior to surgery and resume ASAP after surgery. Reported last Xarelto 09/16/21.  09/16/2021 presurgical COVID-19 test negative.  Anesthesia team to evaluate on the day of surgery.   VS: Pulse 74    Temp 36.7 C (Oral)    Resp 20    Ht 5\' 4"  (1.626 m)    Wt (!) 147.2 kg    SpO2 97%    BMI 55.72 kg/m  BP Readings from Last 3 Encounters:  09/11/21 139/85  07/31/21 132/81  07/24/21 (!) 161/87     PROVIDERS: Hoyt Koch, MD is PCP Laymond Purser, MD  is cardiologist [facility] in [place], [place]). Last office note requested. Locally, she saw Dorris Carnes, MD is [phone].  Renato Shin, MD is endocrinologist. Last visit 07/02/20. Oncologist is with [facility] in [place]. Last visit 02/11/21 with Loney Laurence, [place] (scanned under Media tab).   LABS: Labs reviewed: Acceptable for surgery. (all labs ordered are listed, but only abnormal results are displayed)  Labs Reviewed  CBC - Abnormal; Notable for the following components:      Result Value   WBC 12.5 (*)    RDW 17.0 (*)    All other components within normal limits  COMPREHENSIVE METABOLIC PANEL - Abnormal; Notable for the following components:   Albumin 3.2 (*)    All other components within normal limits  HEMOGLOBIN A1C - Abnormal; Notable for the following components:   Hgb A1c MFr Bld 5.7 (*)    All other components within normal limits  SURGICAL PCR SCREEN  SARS CORONAVIRUS 2 (TAT 6-24 HRS)  GLUCOSE, CAPILLARY     IMAGES: 1V CXR ([place] 08/19/21): Findings: Under expanded lungs.  No pneumothorax or pleural effusion.  Heart size mildly enlarged.  Bones are unremarkable. Impression: Mild cardiomegaly.  MRI L-spine 06/14/21: IMPRESSION: 1. New inferior right disc extrusion at L4-5 further impacting the traversing right L5 nerve roots. 2. Moderate central canal narrowing at L4-5 has progressed. 3. Mild foraminal narrowing bilaterally at L4-5 is stable. 4. Mild left subarticular and foraminal narrowing at L3-4 is stable. 5. Central annular tear and moderate facet hypertrophy at L5-S1 with slight progression of  mild subarticular narrowing, left greater than right.    EKG: EKG 08/19/21 9:23:48 ([place]):  Normal sinus rhythm  EKG 08/19/21 8:46:38 ([place]):  Atrial fibrillation with rapid ventricular response at 129 bpm Nonspecific ST abnormality  EKG 04/23/21:  Normal sinus rhythm Low voltage  QRS Cannot rule out anterior infarct, age undetermined   CV: Echo 08/15/20 ([place]): Summary: Normal LV size with normal function.  The ejection fraction is 60%.  There is mild concentric hypertrophy. Normal right ventricular size with normal function. The left atrium is mildly dilated. The right ventricle is normal in size with normal function. The right atrium is normal. The mitral valve is normal with trace regurgitation. The aortic valve is normal and trileaflet. The tricuspid valve and pulmonic valve are not well seen. The aorta is normal.  The aortic root is normal.    Nuclear stress test 08/15/20 ([place]) Summary: 1.  Normal left ventricular systolic function with ejection fraction of 68%. 2.  No clear definite scintigraphic evidence of ischemia or scar.  CT Coronary/Calcium score 03/16/19: IMPRESSION: 1. Coronary calcium score of 4. This was 71 percentile for age and sex matched control. 2. Normal coronary origin with right dominance. 3. Study quality is affected by patient's size, however there is only minimal non-obstructive CAD, risk factor modification is recommended.   Past Medical History:  Diagnosis Date   Abscess    Arthritis    Atrial fibrillation ([place])    Cardiac arrhythmia due to congenital heart disease    trace MR 08/15/20 echo ([place])   CVID (common variable immunodeficiency) ([place]) 2015   Diabetes mellitus without complication (HCC)    Diverticulosis    Elevated cholesterol    GERD (gastroesophageal reflux disease)    High blood pressure    Non Hodgkin's lymphoma ([place]) 1998   Obesity    Pneumonia    PONV (postoperative nausea and vomiting)    POTS (postural orthostatic tachycardia syndrome)    Pre-diabetes    UTI (urinary tract infection)     Past Surgical History:  Procedure Laterality Date   ABDOMINAL HYSTERECTOMY     APPENDECTOMY     CESAREAN SECTION     GALLBLADDER SURGERY     JOINT REPLACEMENT  Left    hip   JOINT REPLACEMENT Right    Right Hip 2022   left shoulder repair     LUNG REMOVAL, PARTIAL     myringostomy Bilateral    SPLENECTOMY, TOTAL     TONSILLECTOMY AND ADENOIDECTOMY      MEDICATIONS:  albuterol (VENTOLIN HFA) 108 (90 Base) MCG/ACT inhaler   Cholecalciferol (VITAMIN D3) 1.25 MG (50000 UT) CAPS   cyclobenzaprine (FLEXERIL) 10 MG tablet   diphenhydrAMINE (BENADRYL) 25 MG tablet   DULoxetine (CYMBALTA) 60 MG capsule   flecainide (TAMBOCOR) 100 MG tablet   flecainide (TAMBOCOR) 50 MG tablet   gabapentin (NEURONTIN) 100 MG capsule   ibuprofen (ADVIL) 200 MG tablet   Immune Globulin, Human, 4 GM/20ML SOLN   Insulin Pen Needle (UNIFINE PENTIPS) 32G X 4 MM MISC   methocarbamol (ROBAXIN) 750 MG tablet   Multiple Vitamin (MULTIVITAMIN WITH MINERALS) TABS tablet   nebivolol (BYSTOLIC) 2.5 MG tablet   predniSONE (DELTASONE) 20 MG tablet   rivaroxaban (XARELTO) 20 MG TABS tablet   rosuvastatin (CRESTOR) 10 MG tablet   traMADol (ULTRAM) 50 MG tablet   zolpidem (AMBIEN) 5 MG tablet   No current facility-administered medications for this encounter.  No longer on prednisone.   Myra Gianotti,  PA-C Surgical Short Stay/Anesthesiology [facility] Phone [phone] [facility] Phone [phone] 09/17/2021 1:59 PM

## 2021-09-18 ENCOUNTER — Ambulatory Visit (HOSPITAL_COMMUNITY): Payer: 59 | Admitting: Vascular Surgery

## 2021-09-18 ENCOUNTER — Encounter (HOSPITAL_COMMUNITY): Payer: Self-pay | Admitting: Orthopaedic Surgery

## 2021-09-18 ENCOUNTER — Encounter (HOSPITAL_COMMUNITY)
Admission: RE | Disposition: A | Discharge status: Home / Self Care | Payer: Self-pay | Source: Home / Self Care | Attending: Orthopaedic Surgery

## 2021-09-18 ENCOUNTER — Observation Stay (HOSPITAL_COMMUNITY)
Admission: RE | Admit: 2021-09-18 | Discharge: 2021-09-19 | Disposition: A | Discharge status: Home / Self Care | Payer: 59 | Attending: Orthopaedic Surgery | Admitting: Orthopaedic Surgery

## 2021-09-18 ENCOUNTER — Ambulatory Visit (HOSPITAL_COMMUNITY): Payer: 59

## 2021-09-18 ENCOUNTER — Ambulatory Visit (HOSPITAL_COMMUNITY): Payer: 59 | Admitting: Anesthesiology

## 2021-09-18 DIAGNOSIS — Z96643 Presence of artificial hip joint, bilateral: Secondary | ICD-10-CM | POA: Insufficient documentation

## 2021-09-18 DIAGNOSIS — E7800 Pure hypercholesterolemia, unspecified: Secondary | ICD-10-CM | POA: Diagnosis not present

## 2021-09-18 DIAGNOSIS — I4891 Unspecified atrial fibrillation: Secondary | ICD-10-CM | POA: Diagnosis not present

## 2021-09-18 DIAGNOSIS — E119 Type 2 diabetes mellitus without complications: Secondary | ICD-10-CM | POA: Insufficient documentation

## 2021-09-18 DIAGNOSIS — I10 Essential (primary) hypertension: Secondary | ICD-10-CM | POA: Diagnosis not present

## 2021-09-18 DIAGNOSIS — M5126 Other intervertebral disc displacement, lumbar region: Secondary | ICD-10-CM | POA: Diagnosis present

## 2021-09-18 DIAGNOSIS — Z794 Long term (current) use of insulin: Secondary | ICD-10-CM | POA: Insufficient documentation

## 2021-09-18 DIAGNOSIS — Z87891 Personal history of nicotine dependence: Secondary | ICD-10-CM | POA: Diagnosis not present

## 2021-09-18 DIAGNOSIS — Z01818 Encounter for other preprocedural examination: Secondary | ICD-10-CM

## 2021-09-18 DIAGNOSIS — M2578 Osteophyte, vertebrae: Secondary | ICD-10-CM | POA: Diagnosis not present

## 2021-09-18 DIAGNOSIS — Z79899 Other long term (current) drug therapy: Secondary | ICD-10-CM | POA: Insufficient documentation

## 2021-09-18 DIAGNOSIS — Z419 Encounter for procedure for purposes other than remedying health state, unspecified: Secondary | ICD-10-CM

## 2021-09-18 DIAGNOSIS — M5116 Intervertebral disc disorders with radiculopathy, lumbar region: Principal | ICD-10-CM | POA: Diagnosis present

## 2021-09-18 HISTORY — PX: LUMBAR LAMINECTOMY: SHX95

## 2021-09-18 HISTORY — DX: Polymyalgia rheumatica: M35.3

## 2021-09-18 LAB — GLUCOSE, CAPILLARY
Glucose-Capillary: 94 mg/dL (ref 70–99)
Glucose-Capillary: 196 mg/dL — ABNORMAL HIGH (ref 70–99)

## 2021-09-18 SURGERY — MICRODISCECTOMY LUMBAR LAMINECTOMY
Anesthesia: General | Site: Back

## 2021-09-18 MED ORDER — DOCUSATE SODIUM 100 MG PO CAPS
100.0000 mg | ORAL_CAPSULE | Freq: Two times a day (BID) | ORAL | Status: DC
  Administered 2021-09-18 – 2021-09-19 (×2): 100 mg via ORAL
  Filled 2021-09-18 (×2): qty 1

## 2021-09-18 MED ORDER — OXYCODONE-ACETAMINOPHEN 5-325 MG PO TABS: 1.0000 | ORAL_TABLET | ORAL | 0 refills | Status: DC | PRN

## 2021-09-18 MED ORDER — SODIUM CHLORIDE 0.9 % IV SOLN: 250.0000 mL | INTRAVENOUS | Status: DC

## 2021-09-18 MED ORDER — ONDANSETRON HCL 4 MG PO TABS: 4.0000 mg | ORAL_TABLET | Freq: Four times a day (QID) | ORAL | Status: DC | PRN

## 2021-09-18 MED ORDER — MIDAZOLAM HCL 2 MG/2ML IJ SOLN
INTRAMUSCULAR | Status: DC | PRN
  Administered 2021-09-18: 2 mg via INTRAVENOUS

## 2021-09-18 MED ORDER — EPHEDRINE SULFATE-NACL 50-0.9 MG/10ML-% IV SOSY
PREFILLED_SYRINGE | INTRAVENOUS | Status: DC | PRN
  Administered 2021-09-18: 10 mg via INTRAVENOUS
  Administered 2021-09-18: 5 mg via INTRAVENOUS
  Administered 2021-09-18: 10 mg via INTRAVENOUS

## 2021-09-18 MED ORDER — CELECOXIB 200 MG PO CAPS: 200.0000 mg | ORAL_CAPSULE | Freq: Once | ORAL | Status: AC

## 2021-09-18 MED ORDER — OXYCODONE HCL 5 MG PO TABS
5.0000 mg | ORAL_TABLET | ORAL | Status: DC | PRN
  Administered 2021-09-18 – 2021-09-19 (×3): 5 mg via ORAL
  Filled 2021-09-18 (×3): qty 1

## 2021-09-18 MED ORDER — ROCURONIUM BROMIDE 10 MG/ML (PF) SYRINGE
PREFILLED_SYRINGE | INTRAVENOUS | Status: DC | PRN
  Administered 2021-09-18: 100 mg via INTRAVENOUS

## 2021-09-18 MED ORDER — FENTANYL CITRATE (PF) 100 MCG/2ML IJ SOLN
INTRAMUSCULAR | Status: AC
  Filled 2021-09-18: qty 2

## 2021-09-18 MED ORDER — CEFAZOLIN SODIUM-DEXTROSE 2-4 GM/100ML-% IV SOLN
2.0000 g | Freq: Three times a day (TID) | INTRAVENOUS | Status: AC
  Administered 2021-09-18 – 2021-09-19 (×2): 2 g via INTRAVENOUS
  Filled 2021-09-18 (×2): qty 100

## 2021-09-18 MED ORDER — PROMETHAZINE HCL 25 MG/ML IJ SOLN: 6.2500 mg | INTRAMUSCULAR | Status: DC | PRN

## 2021-09-18 MED ORDER — DEXAMETHASONE SODIUM PHOSPHATE 10 MG/ML IJ SOLN
INTRAMUSCULAR | Status: DC | PRN
  Administered 2021-09-18: 10 mg via INTRAVENOUS

## 2021-09-18 MED ORDER — ALBUTEROL SULFATE (2.5 MG/3ML) 0.083% IN NEBU: 3.0000 mL | INHALATION_SOLUTION | Freq: Four times a day (QID) | RESPIRATORY_TRACT | Status: DC | PRN

## 2021-09-18 MED ORDER — DULOXETINE HCL 30 MG PO CPEP
60.0000 mg | ORAL_CAPSULE | Freq: Every day | ORAL | Status: DC
  Administered 2021-09-19: 09:00:00 60 mg via ORAL
  Filled 2021-09-18: qty 2

## 2021-09-18 MED ORDER — ACETAMINOPHEN 500 MG PO TABS
ORAL_TABLET | ORAL | Status: AC
  Administered 2021-09-18: 11:00:00 1000 mg via ORAL
  Filled 2021-09-18: qty 2

## 2021-09-18 MED ORDER — MENTHOL 3 MG MT LOZG: 1.0000 | LOZENGE | OROMUCOSAL | Status: DC | PRN

## 2021-09-18 MED ORDER — SODIUM CHLORIDE 0.9% FLUSH: 3.0000 mL | Freq: Two times a day (BID) | INTRAVENOUS | Status: DC

## 2021-09-18 MED ORDER — LACTATED RINGERS IV SOLN: INTRAVENOUS | Status: DC | PRN

## 2021-09-18 MED ORDER — ROSUVASTATIN CALCIUM 5 MG PO TABS
10.0000 mg | ORAL_TABLET | Freq: Every day | ORAL | Status: DC
  Administered 2021-09-18: 17:00:00 10 mg via ORAL
  Filled 2021-09-18: qty 2

## 2021-09-18 MED ORDER — PROPOFOL 10 MG/ML IV BOLUS
INTRAVENOUS | Status: DC | PRN
  Administered 2021-09-18 (×2): 50 mg via INTRAVENOUS
  Administered 2021-09-18: 200 mg via INTRAVENOUS
  Administered 2021-09-18: 50 mg via INTRAVENOUS

## 2021-09-18 MED ORDER — CHLORHEXIDINE GLUCONATE 0.12 % MT SOLN
OROMUCOSAL | Status: AC
  Administered 2021-09-18: 11:00:00 15 mL
  Filled 2021-09-18: qty 15

## 2021-09-18 MED ORDER — MIDAZOLAM HCL 2 MG/2ML IJ SOLN
INTRAMUSCULAR | Status: AC
  Filled 2021-09-18: qty 2

## 2021-09-18 MED ORDER — METHOCARBAMOL 1000 MG/10ML IJ SOLN
500.0000 mg | Freq: Four times a day (QID) | INTRAVENOUS | Status: DC | PRN
  Filled 2021-09-18: qty 5

## 2021-09-18 MED ORDER — FLECAINIDE ACETATE 100 MG PO TABS
100.0000 mg | ORAL_TABLET | Freq: Two times a day (BID) | ORAL | Status: DC
  Administered 2021-09-19: 09:00:00 100 mg via ORAL
  Filled 2021-09-18 (×3): qty 1

## 2021-09-18 MED ORDER — SCOPOLAMINE 1 MG/3DAYS TD PT72
MEDICATED_PATCH | TRANSDERMAL | Status: AC
  Administered 2021-09-18: 11:00:00 1.5 mg via TRANSDERMAL
  Filled 2021-09-18: qty 1

## 2021-09-18 MED ORDER — ALBUTEROL SULFATE HFA 108 (90 BASE) MCG/ACT IN AERS
INHALATION_SPRAY | RESPIRATORY_TRACT | Status: DC | PRN
  Administered 2021-09-18: 6 via RESPIRATORY_TRACT

## 2021-09-18 MED ORDER — CELECOXIB 200 MG PO CAPS
ORAL_CAPSULE | ORAL | Status: AC
  Administered 2021-09-18: 11:00:00 200 mg via ORAL
  Filled 2021-09-18: qty 1

## 2021-09-18 MED ORDER — 0.9 % SODIUM CHLORIDE (POUR BTL) OPTIME
TOPICAL | Status: DC | PRN
  Administered 2021-09-18: 13:00:00 1000 mL

## 2021-09-18 MED ORDER — ONDANSETRON HCL 4 MG/2ML IJ SOLN: 4.0000 mg | Freq: Four times a day (QID) | INTRAMUSCULAR | Status: DC | PRN

## 2021-09-18 MED ORDER — ONDANSETRON HCL 4 MG/2ML IJ SOLN
INTRAMUSCULAR | Status: DC | PRN
  Administered 2021-09-18: 4 mg via INTRAVENOUS

## 2021-09-18 MED ORDER — HEMOSTATIC AGENTS (NO CHARGE) OPTIME
TOPICAL | Status: DC | PRN
  Administered 2021-09-18: 1 via TOPICAL

## 2021-09-18 MED ORDER — SODIUM CHLORIDE 0.9% FLUSH: 3.0000 mL | INTRAVENOUS | Status: DC | PRN

## 2021-09-18 MED ORDER — PHENYLEPHRINE HCL-NACL 20-0.9 MG/250ML-% IV SOLN
INTRAVENOUS | Status: DC | PRN
  Administered 2021-09-18: 25 ug/min via INTRAVENOUS

## 2021-09-18 MED ORDER — ACETAMINOPHEN 500 MG PO TABS: 1000.0000 mg | ORAL_TABLET | Freq: Once | ORAL | Status: AC

## 2021-09-18 MED ORDER — PHENYLEPHRINE 40 MCG/ML (10ML) SYRINGE FOR IV PUSH (FOR BLOOD PRESSURE SUPPORT)
PREFILLED_SYRINGE | INTRAVENOUS | Status: DC | PRN
  Administered 2021-09-18: 120 ug via INTRAVENOUS
  Administered 2021-09-18: 80 ug via INTRAVENOUS

## 2021-09-18 MED ORDER — METHOCARBAMOL 500 MG PO TABS
500.0000 mg | ORAL_TABLET | Freq: Four times a day (QID) | ORAL | Status: DC | PRN
  Administered 2021-09-18 – 2021-09-19 (×3): 500 mg via ORAL
  Filled 2021-09-18 (×3): qty 1

## 2021-09-18 MED ORDER — SCOPOLAMINE 1 MG/3DAYS TD PT72: 1.0000 | MEDICATED_PATCH | TRANSDERMAL | Status: DC

## 2021-09-18 MED ORDER — POLYETHYLENE GLYCOL 3350 17 G PO PACK: 17.0000 g | PACK | Freq: Every day | ORAL | Status: DC | PRN

## 2021-09-18 MED ORDER — NEBIVOLOL HCL 2.5 MG PO TABS
2.5000 mg | ORAL_TABLET | Freq: Every day | ORAL | Status: DC
  Administered 2021-09-19: 09:00:00 2.5 mg via ORAL
  Filled 2021-09-18: qty 1

## 2021-09-18 MED ORDER — PROPOFOL 10 MG/ML IV BOLUS
INTRAVENOUS | Status: AC
  Filled 2021-09-18: qty 20

## 2021-09-18 MED ORDER — SODIUM CHLORIDE 0.9 % IV SOLN: INTRAVENOUS | Status: DC

## 2021-09-18 MED ORDER — ACETAMINOPHEN 650 MG RE SUPP: 650.0000 mg | RECTAL | Status: DC | PRN

## 2021-09-18 MED ORDER — LIDOCAINE 2% (20 MG/ML) 5 ML SYRINGE
INTRAMUSCULAR | Status: DC | PRN
  Administered 2021-09-18: 60 mg via INTRAVENOUS

## 2021-09-18 MED ORDER — SUGAMMADEX SODIUM 200 MG/2ML IV SOLN
INTRAVENOUS | Status: DC | PRN
  Administered 2021-09-18: 400 mg via INTRAVENOUS

## 2021-09-18 MED ORDER — GLYCOPYRROLATE PF 0.2 MG/ML IJ SOSY
PREFILLED_SYRINGE | INTRAMUSCULAR | Status: DC | PRN
  Administered 2021-09-18: .1 mg via INTRAVENOUS

## 2021-09-18 MED ORDER — ACETAMINOPHEN 325 MG PO TABS
650.0000 mg | ORAL_TABLET | ORAL | Status: DC | PRN
  Administered 2021-09-18 – 2021-09-19 (×3): 650 mg via ORAL
  Filled 2021-09-18 (×3): qty 2

## 2021-09-18 MED ORDER — METHOCARBAMOL 500 MG PO TABS: 500.0000 mg | ORAL_TABLET | Freq: Four times a day (QID) | ORAL | 0 refills | Status: DC

## 2021-09-18 MED ORDER — FENTANYL CITRATE (PF) 250 MCG/5ML IJ SOLN
INTRAMUSCULAR | Status: AC
  Filled 2021-09-18: qty 5

## 2021-09-18 MED ORDER — PHENOL 1.4 % MT LIQD: 1.0000 | OROMUCOSAL | Status: DC | PRN

## 2021-09-18 MED ORDER — CEFAZOLIN SODIUM-DEXTROSE 2-4 GM/100ML-% IV SOLN
INTRAVENOUS | Status: AC
  Filled 2021-09-18: qty 100

## 2021-09-18 MED ORDER — CEFAZOLIN IN SODIUM CHLORIDE 3-0.9 GM/100ML-% IV SOLN
3.0000 g | INTRAVENOUS | Status: AC
  Administered 2021-09-18: 13:00:00 3 g via INTRAVENOUS
  Filled 2021-09-18: qty 100

## 2021-09-18 MED ORDER — GABAPENTIN 100 MG PO CAPS
100.0000 mg | ORAL_CAPSULE | Freq: Three times a day (TID) | ORAL | Status: DC
  Administered 2021-09-18 – 2021-09-19 (×3): 100 mg via ORAL
  Filled 2021-09-18 (×3): qty 1

## 2021-09-18 MED ORDER — BUPIVACAINE HCL (PF) 0.25 % IJ SOLN
INTRAMUSCULAR | Status: AC
  Filled 2021-09-18: qty 30

## 2021-09-18 MED ORDER — AMISULPRIDE (ANTIEMETIC) 5 MG/2ML IV SOLN
10.0000 mg | Freq: Once | INTRAVENOUS | Status: AC | PRN
  Administered 2021-09-18: 16:00:00 10 mg via INTRAVENOUS

## 2021-09-18 MED ORDER — HYDROMORPHONE HCL 1 MG/ML IJ SOLN
0.5000 mg | INTRAMUSCULAR | Status: DC | PRN
  Administered 2021-09-19: 04:00:00 0.5 mg via INTRAVENOUS
  Filled 2021-09-18: qty 0.5

## 2021-09-18 MED ORDER — FENTANYL CITRATE (PF) 250 MCG/5ML IJ SOLN
INTRAMUSCULAR | Status: DC | PRN
  Administered 2021-09-18: 50 ug via INTRAVENOUS
  Administered 2021-09-18: 25 ug via INTRAVENOUS
  Administered 2021-09-18: 50 ug via INTRAVENOUS
  Administered 2021-09-18 (×2): 25 ug via INTRAVENOUS

## 2021-09-18 MED ORDER — FENTANYL CITRATE (PF) 100 MCG/2ML IJ SOLN
25.0000 ug | INTRAMUSCULAR | Status: DC | PRN
  Administered 2021-09-18 (×4): 25 ug via INTRAVENOUS

## 2021-09-18 MED ORDER — AMISULPRIDE (ANTIEMETIC) 5 MG/2ML IV SOLN
INTRAVENOUS | Status: AC
  Filled 2021-09-18: qty 2

## 2021-09-18 SURGICAL SUPPLY — 48 items
BAG COUNTER SPONGE SURGICOUNT (BAG) ×2 IMPLANT
BAG SURGICOUNT SPONGE COUNTING (BAG) ×1
BUR ROUND FLUTED 4 SOFT TCH (BURR) ×1 IMPLANT
BUR ROUND FLUTED 4MM SOFT TCH (BURR) ×1
CANISTER SUCT 3000ML PPV (MISCELLANEOUS) ×3 IMPLANT
COVER SURGICAL LIGHT HANDLE (MISCELLANEOUS) ×1 IMPLANT
DERMABOND ADVANCED (GAUZE/BANDAGES/DRESSINGS) ×2
DERMABOND ADVANCED .7 DNX12 (GAUZE/BANDAGES/DRESSINGS) ×1 IMPLANT
DRAPE MICROSCOPE LEICA (MISCELLANEOUS) ×3 IMPLANT
DRSG MEPILEX BORDER 4X4 (GAUZE/BANDAGES/DRESSINGS) IMPLANT
DRSG MEPILEX BORDER 4X8 (GAUZE/BANDAGES/DRESSINGS) ×2 IMPLANT
DURAPREP 26ML APPLICATOR (WOUND CARE) ×3 IMPLANT
DURASEAL SPINE SEALANT 3ML (MISCELLANEOUS) IMPLANT
ELECT BLADE 4.0 EZ CLEAN MEGAD (MISCELLANEOUS) ×3
ELECT REM PT RETURN 9FT ADLT (ELECTROSURGICAL) ×3
ELECTRODE BLDE 4.0 EZ CLN MEGD (MISCELLANEOUS) IMPLANT
ELECTRODE REM PT RTRN 9FT ADLT (ELECTROSURGICAL) ×1 IMPLANT
GAUZE SPONGE 4X4 12PLY STRL (GAUZE/BANDAGES/DRESSINGS) ×1 IMPLANT
GLOVE SRG 8 PF TXTR STRL LF DI (GLOVE) ×2 IMPLANT
GLOVE SURG ORTHO LTX SZ7.5 (GLOVE) ×6 IMPLANT
GLOVE SURG UNDER POLY LF SZ8 (GLOVE) ×4
GOWN STRL REUS W/ TWL LRG LVL3 (GOWN DISPOSABLE) ×1 IMPLANT
GOWN STRL REUS W/ TWL XL LVL3 (GOWN DISPOSABLE) ×1 IMPLANT
GOWN STRL REUS W/TWL 2XL LVL3 (GOWN DISPOSABLE) ×7 IMPLANT
GOWN STRL REUS W/TWL LRG LVL3 (GOWN DISPOSABLE)
GOWN STRL REUS W/TWL XL LVL3 (GOWN DISPOSABLE) ×2
KIT BASIN OR (CUSTOM PROCEDURE TRAY) ×3 IMPLANT
KIT TURNOVER KIT B (KITS) ×3 IMPLANT
NDL SPNL 18GX3.5 QUINCKE PK (NEEDLE) ×1 IMPLANT
NEEDLE SPNL 18GX3.5 QUINCKE PK (NEEDLE) ×3 IMPLANT
NS IRRIG 1000ML POUR BTL (IV SOLUTION) ×3 IMPLANT
PACK LAMINECTOMY ORTHO (CUSTOM PROCEDURE TRAY) ×3 IMPLANT
PAD ARMBOARD 7.5X6 YLW CONV (MISCELLANEOUS) ×8 IMPLANT
PATTIES SURGICAL .5 X.5 (GAUZE/BANDAGES/DRESSINGS) IMPLANT
PATTIES SURGICAL .75X.75 (GAUZE/BANDAGES/DRESSINGS) IMPLANT
STAPLER VISISTAT (STAPLE) ×2 IMPLANT
SURGIFLO W/THROMBIN 8M KIT (HEMOSTASIS) ×2 IMPLANT
SUT VIC AB 0 CT1 27 (SUTURE) ×2
SUT VIC AB 0 CT1 27XBRD ANBCTR (SUTURE) IMPLANT
SUT VIC AB 1 CTX 36 (SUTURE) ×4
SUT VIC AB 1 CTX36XBRD ANBCTR (SUTURE) ×1 IMPLANT
SUT VIC AB 2-0 CT1 27 (SUTURE) ×2
SUT VIC AB 2-0 CT1 TAPERPNT 27 (SUTURE) ×1 IMPLANT
SUT VIC AB 3-0 X1 27 (SUTURE) IMPLANT
SYR 20ML ECCENTRIC (SYRINGE) ×2 IMPLANT
TOWEL GREEN STERILE (TOWEL DISPOSABLE) ×3 IMPLANT
TOWEL GREEN STERILE FF (TOWEL DISPOSABLE) ×3 IMPLANT
WATER STERILE IRR 1000ML POUR (IV SOLUTION) ×3 IMPLANT

## 2021-09-18 NOTE — Op Note (Signed)
Preop diagnosis: Right L4-5 herniated nucleus pulposus with radiculopathy.  Postop diagnosis same  Procedure: Right L4-5 microdiscectomy with partial facetectomy.  Surgeon: Lorin Mercy MD  Assistant: Benjiman Core, PA-C medically necessary and present for the entire procedure.  EBL 100 cc  Anesthesia: General oral tracheal plus Marcaine skin local.  Procedure: After induction general anesthesia orotracheal ovation patient was placed on chest rolls prone careful padding positioning extra tape over the chest due to patient's increased BMI.  3 g Ancef was given prophylactically.  Patient had extruded L4-5 caudally migrated disc extrusion impacting the right L5 nerve root.  She had radiculopathy failed conservative treatment.  Needle localization with spinal needle was taken initially needle was placed at L3-4 this was done after a timeout and sutures completed.  Prepping draping and sterile skin marker Betadine Steri-Drape had been applied.  Needle was moved down 1 level repeat x-rays taken and then skin was marked.  Incision was made subperiosteal dissection down to the lamina and the extra deep longus plate retractors on the McCullough retractor was used.  Second x-ray was taken with a Kocher clamp directly down over the spinous process directly over the disc.  There was no narrow distance between the pedicles and facet hypertrophy with the facet fluid requiring a portion of the inferior aspect of the spinous process to be removed in order to get into the canal.  Medial facetectomy was performed using a 4 mm bur and 2 mm was taken off the medial wall of the pedicle which allowed exposure of the disc.  Operative microscope was used bipolar cautery was used with extra long bipolar to coagulate some of the veins.  Disc extrusion was in the pocket caudally position right paracentral.  Annulus was incised and passes were made with straight up-biting and straight and up-biting micropituitary's.  Once this was cleaned  out dura was not as tight and the extruded area was exposed gently teased with the [facility] and then removed with micropituitary.  Once this occurred dura and nerve root was much looser.  Ligamentum had been excised and additional passes were made into the disc space making sure we cleaned out the disc and Epstein curette was used just underneath the ligament pushing down and then removing some additional disc material.  Passes could be made anterior to the dura without areas of compression copious irrigation.  Bone wax was used along the base the medial wall pedicle where 2 mm of bone had been removed stopping sublingual bone bleeding.  Nerve root was free repeat irrigation and then scope was removed standard layered closure was performed.  Patient tolerated procedure well transferred recovery in stable condition.

## 2021-09-18 NOTE — Transfer of Care (Signed)
Immediate Anesthesia Transfer of Care Note  Patient: Monique Clark  Procedure(s) Performed: RIGHT LUMBAR FOUR-FIVE MICRODISCECTOMY (Back)  Patient Location: PACU  Anesthesia Type:General  Level of Consciousness: awake, alert  and oriented  Airway & Oxygen Therapy: Patient Spontanous Breathing  Post-op Assessment: Report given to RN and Post -op Vital signs reviewed and stable  Post vital signs: Reviewed and stable  Last Vitals:  Vitals Value Taken Time  BP 106/87 09/18/21 1447  Temp    Pulse 73 09/18/21 1449  Resp 18 09/18/21 1449  SpO2 92 % 09/18/21 1449  Vitals shown include unvalidated device data.  Last Pain:  Vitals:   09/18/21 1050  TempSrc:   PainSc: 3       Patients Stated Pain Goal: 0 (70/48/88 9169)  Complications: No notable events documented.

## 2021-09-18 NOTE — Anesthesia Procedure Notes (Signed)
Procedure Name: Intubation Date/Time: 09/18/2021 12:52 PM Performed by: Dorthea Cove, CRNA Pre-anesthesia Checklist: Patient identified, Emergency Drugs available, Suction available and Patient being monitored Patient Re-evaluated:Patient Re-evaluated prior to induction Oxygen Delivery Method: Circle system utilized Preoxygenation: Pre-oxygenation with 100% oxygen Induction Type: IV induction Ventilation: Mask ventilation without difficulty Laryngoscope Size: Glidescope and 3 Grade View: Grade I Tube type: Oral Tube size: 7.5 mm Number of attempts: 1 Airway Equipment and Method: Stylet and Oral airway Placement Confirmation: ETT inserted through vocal cords under direct vision, positive ETCO2 and breath sounds checked- equal and bilateral Secured at: 21 cm Tube secured with: Tape Dental Injury: Teeth and Oropharynx as per pre-operative assessment

## 2021-09-18 NOTE — Anesthesia Postprocedure Evaluation (Signed)
Anesthesia Post Note  Patient: Monique Clark  Procedure(s) Performed: RIGHT LUMBAR FOUR-FIVE MICRODISCECTOMY (Back)     Patient location during evaluation: PACU Anesthesia Type: General Level of consciousness: awake and alert, oriented and patient cooperative Pain management: pain level controlled Vital Signs Assessment: post-procedure vital signs reviewed and stable Respiratory status: spontaneous breathing, nonlabored ventilation and respiratory function stable Cardiovascular status: blood pressure returned to baseline and stable Postop Assessment: no apparent nausea or vomiting Anesthetic complications: no   No notable events documented.  Last Vitals:  Vitals:   09/18/21 1043 09/18/21 1447  BP: (!) 146/69 106/87  Pulse: 67 73  Resp: 17 (!) 22  Temp: 36.8 C 36.9 C  SpO2: 97% 91%    Last Pain:  Vitals:   09/18/21 1050  TempSrc:   PainSc: [place]

## 2021-09-18 NOTE — H&P (Signed)
Monique Clark is an 55 y.o. female.   Chief Complaint: Back pain and lower extremity radiculopathy HPI: 55 year old female with history of right L4-5 HNP comes in for preop evaluation.  Symptoms unchanged from previous visit.  She is wanting to proceed with right L4-5 microdiscectomy as scheduled.  History and physical performed.  View of systems negative.  Past Medical History:  Diagnosis Date   Abscess    Arthritis    Atrial fibrillation ([place])    Cardiac arrhythmia due to congenital heart disease    trace MR 08/15/20 echo ([place])   CVID (common variable immunodeficiency) ([place]) 2015   Diabetes mellitus without complication (HCC)    Diverticulosis    Elevated cholesterol    GERD (gastroesophageal reflux disease)    High blood pressure    Non Hodgkin's lymphoma ([place]) 1998   Obesity    PMR (polymyalgia rheumatica) (HCC)    Pneumonia    PONV (postoperative nausea and vomiting)    POTS (postural orthostatic tachycardia syndrome)    Pre-diabetes    UTI (urinary tract infection)     Past Surgical History:  Procedure Laterality Date   ABDOMINAL HYSTERECTOMY     APPENDECTOMY     CESAREAN SECTION     GALLBLADDER SURGERY     JOINT REPLACEMENT Left    hip   JOINT REPLACEMENT Right    Right Hip 2022   left shoulder repair     LUNG REMOVAL, PARTIAL     myringostomy Bilateral    SPLENECTOMY, TOTAL  1998   TONSILLECTOMY AND ADENOIDECTOMY      Family History  Problem Relation Age of Onset   Atrial fibrillation Mother    Cancer Father        Colon   Atrial fibrillation Maternal Uncle    Atrial fibrillation Maternal Grandmother    Atrial fibrillation Maternal Grandfather    Heart attack Maternal Grandfather    Ovarian cancer Neg Hx    Social History:  reports that she quit smoking about 18 years ago. Her smoking use included cigarettes. She smoked an average of .5 packs per day. She has never used smokeless tobacco. She reports that she does not drink alcohol and  does not use drugs.  Allergies:  Allergies  Allergen Reactions   Nizatidine Hives    Axid   Promethazine Other (See Comments)    High doses causes confusion   Tetanus Toxoids Other (See Comments)    angioedema    Medications Prior to Admission  Medication Sig Dispense Refill   albuterol (VENTOLIN HFA) 108 (90 Base) MCG/ACT inhaler Inhale 2 puffs into the lungs every 6 (six) hours as needed for shortness of breath or wheezing.     Cholecalciferol (VITAMIN D3) 1.25 MG (50000 UT) CAPS TAKE 1 CAPSULE BY MOUTH TWICE A WEEK AS DIRECTED 8 capsule 5   cyclobenzaprine (FLEXERIL) 10 MG tablet Take 1 tablet (10 mg total) by mouth 3 (three) times daily as needed for muscle spasms. 60 tablet 0   diphenhydrAMINE (BENADRYL) 25 MG tablet Take 50 mg by mouth See admin instructions. Take 2 tablets (50 mg) by mouth at bedtime as needed for sleep, and take 2 tablets (50 mg) with each dose of Hizentra     DULoxetine (CYMBALTA) 60 MG capsule Take 1 capsule (60 mg total) by mouth daily. 90 capsule 3   flecainide (TAMBOCOR) 100 MG tablet Take 1 tablet by mouth every 12 hours 180 tablet 3   gabapentin (NEURONTIN) 100 MG capsule Take 1 capsule  by mouth at bedtime, may increase to 1 capsule by mouth three times daily if needed. (Patient taking differently: Take 100 mg by mouth 3 (three) times daily.) 90 capsule 3   ibuprofen (ADVIL) 200 MG tablet Take 800 mg by mouth every 8 (eight) hours as needed for moderate pain.     Immune Globulin, Human, 4 GM/20ML SOLN Inject 36 g into the skin every 14 (fourteen) days. Monday     Multiple Vitamin (MULTIVITAMIN WITH MINERALS) TABS tablet Take 1 tablet by mouth daily.     nebivolol (BYSTOLIC) 2.5 MG tablet TAKE 1 TABLET BY MOUTH ONCE DAILY 90 tablet 1   rivaroxaban (XARELTO) 20 MG TABS tablet TAKE 1 TABLET BY MOUTH DAILY 90 tablet 4   rosuvastatin (CRESTOR) 10 MG tablet Take 1 tablet (10 mg total) by mouth daily at 6 PM. 30 tablet 0   traMADol (ULTRAM) 50 MG tablet Take 1  tablet (50 mg total) by mouth every 12 (twelve) hours as needed. 20 tablet 0   zolpidem (AMBIEN) 5 MG tablet Take 1 tablet by mouth at bedtime as needed for sleep. (Patient taking differently: Take 5 mg by mouth at bedtime.) 30 tablet 3   flecainide (TAMBOCOR) 50 MG tablet TAKE 1 TABLET BY MOUTH EVERY 12 HOURS 660 tablet 0   Insulin Pen Needle (UNIFINE PENTIPS) 32G X 4 MM MISC Use as directed with Saxenda once daily 100 each 0   methocarbamol (ROBAXIN) 750 MG tablet TAKE 1 TABLET BY MOUTH EVERY 6 HOURS AS NEEDED FOR MUSCLE SPASMS. (Patient not taking: Reported on 09/11/2021) 120 tablet 1   predniSONE (DELTASONE) 20 MG tablet Take 1 tablet (20 mg total) by mouth daily with breakfast. (Patient not taking: Reported on 09/11/2021) 30 tablet 0    Results for orders placed or performed during the hospital encounter of 09/18/21 (from the past 48 hour(s))  Glucose, capillary     Status: None   Collection Time: 09/18/21 10:43 AM  Result Value Ref Range   Glucose-Capillary 94 70 - 99 mg/dL    Comment: Glucose reference range applies only to samples taken after fasting for at least 8 hours.   No results found.  Review of Systems  Constitutional:  Positive for activity change.  HENT: Negative.    Eyes: Negative.   Respiratory: Negative.    Cardiovascular: Negative.   Gastrointestinal: Negative.   Musculoskeletal:  Positive for back pain.  Neurological:  Positive for numbness.  Psychiatric/Behavioral: Negative.     Blood pressure (!) 146/69, pulse 67, temperature 98.3 F (36.8 C), temperature source Oral, resp. rate 17, height 5\' 4"  (1.626 m), weight (!) 147 kg, SpO2 97 %. Physical Exam Constitutional:      General: She is not in acute distress. HENT:     Head: Normocephalic and atraumatic.     Nose: Nose normal.  Eyes:     Extraocular Movements: Extraocular movements intact.  Cardiovascular:     Rate and Rhythm: Regular rhythm.     Heart sounds: Normal heart sounds.  Pulmonary:      Effort: Pulmonary effort is normal. No respiratory distress.     Breath sounds: Normal breath sounds.  Musculoskeletal:        General: Tenderness present.  Neurological:     Mental Status: She is alert and oriented to person, place, and time.     Assessment/Plan Right L4-5 HNP  We will proceed with right L4-5 microdiscectomy as scheduled.  Surgical procedure discussed along with potential rehab/recovery time.  All  questions answered.  Benjiman Core, PA-C 09/18/2021, 11:13 AM

## 2021-09-18 NOTE — Interval H&P Note (Signed)
History and Physical Interval Note:  09/18/2021 12:33 PM  Monique Clark  has presented today for surgery, with the diagnosis of right L4-5 herniated nucleus pulposus.  The various methods of treatment have been discussed with the patient and family. After consideration of risks, benefits and other options for treatment, the patient has consented to  Procedure(s): RIGHT L4-5 MICRODISCECTOMY (N/A) as a surgical intervention.  The patient's history has been reviewed, patient examined, no change in status, stable for surgery.  I have reviewed the patient's chart and labs.  Questions were answered to the patient's satisfaction.  Risks of dural tear and reoperation with recurrent herniation discussed.    Marybelle Killings

## 2021-09-19 ENCOUNTER — Other Ambulatory Visit (HOSPITAL_COMMUNITY): Payer: Self-pay

## 2021-09-19 ENCOUNTER — Encounter (HOSPITAL_COMMUNITY): Payer: Self-pay | Admitting: Orthopaedic Surgery

## 2021-09-19 DIAGNOSIS — Z87891 Personal history of nicotine dependence: Secondary | ICD-10-CM | POA: Diagnosis not present

## 2021-09-19 DIAGNOSIS — I10 Essential (primary) hypertension: Secondary | ICD-10-CM | POA: Diagnosis not present

## 2021-09-19 DIAGNOSIS — M5116 Intervertebral disc disorders with radiculopathy, lumbar region: Secondary | ICD-10-CM | POA: Diagnosis not present

## 2021-09-19 DIAGNOSIS — E119 Type 2 diabetes mellitus without complications: Secondary | ICD-10-CM | POA: Diagnosis not present

## 2021-09-19 DIAGNOSIS — Z794 Long term (current) use of insulin: Secondary | ICD-10-CM | POA: Diagnosis not present

## 2021-09-19 DIAGNOSIS — Z79899 Other long term (current) drug therapy: Secondary | ICD-10-CM | POA: Diagnosis not present

## 2021-09-19 DIAGNOSIS — Z96643 Presence of artificial hip joint, bilateral: Secondary | ICD-10-CM | POA: Diagnosis not present

## 2021-09-19 DIAGNOSIS — I4891 Unspecified atrial fibrillation: Secondary | ICD-10-CM | POA: Diagnosis not present

## 2021-09-19 LAB — GLUCOSE, CAPILLARY: Glucose-Capillary: 162 mg/dL — ABNORMAL HIGH (ref 70–99)

## 2021-09-19 MED ORDER — FLUCONAZOLE 50 MG PO TABS
50.0000 mg | ORAL_TABLET | Freq: Every day | ORAL | Status: DC
  Administered 2021-09-19: 11:00:00 50 mg via ORAL
  Filled 2021-09-19: qty 1

## 2021-09-19 MED ORDER — OXYCODONE HCL 5 MG PO TABS
5.0000 mg | ORAL_TABLET | ORAL | Status: DC | PRN
  Administered 2021-09-19 (×2): 10 mg via ORAL
  Filled 2021-09-19 (×2): qty 2

## 2021-09-19 MED ORDER — OXYCODONE-ACETAMINOPHEN 5-325 MG PO TABS
1.0000 | ORAL_TABLET | ORAL | 0 refills | Status: DC | PRN
Start: 2021-09-19 — End: 2021-10-02
  Filled 2021-09-19: qty 40, 7d supply, fill #0

## 2021-09-19 NOTE — Discharge Instructions (Signed)
Okay to shower your dressing is waterproof.  If it happens to peel off you can put some gauze and tape on and just remove it when you do shower and then reapply it.  Come see Dr. Lorin Mercy in 1 week.  Avoid bending turning twisting.  He can sit for using the bathroom and eating otherwise try to avoid sitting in upright position.  Laying back in recliner is okay.  Walk daily goal is to gradually increase to 1 to 2 miles daily over several weeks.  See Dr. Lorin Mercy in 1 week.

## 2021-09-19 NOTE — Evaluation (Signed)
Physical Therapy Evaluation Patient Details Name: Monique Clark MRN: [id number] DOB: 04-17-1966 Today's Date: 09/19/2021  History of Present Illness  55 y.o. female presents to [facility] hospital on 09/18/2021 with history of R L4-5 HNP. Pt underwent R L4-5 microdiscectomy on 09/18/2021. PMH includes OA, Afib, DM, non Hodgkin's lymphoma, PNA.  Clinical Impression  Pt presents to PT with deficits in activity tolerance, strength, power, endurance, gait, balance. Pt is limited by back and LE pain, but is able to mobilize for household distances and negotiate stairs without physical assistance. Pt will benefit from continued aggressive mobilization in an effort to restore independence and aide in a return to work.       Recommendations for follow up therapy are one component of a multi-disciplinary discharge planning process, led by the attending physician.  Recommendations may be updated based on patient status, additional functional criteria and insurance authorization.  Follow Up Recommendations No PT follow up (may benefit from outpatient PT after follow-up with physician)    Assistance Recommended at Discharge Intermittent Supervision/Assistance  Functional Status Assessment Patient has had a recent decline in their functional status and demonstrates the ability to make significant improvements in function in a reasonable and predictable amount of time.  Equipment Recommendations  None recommended by PT    Recommendations for Other Services       Precautions / Restrictions Precautions Precautions: Back;Fall Precaution Booklet Issued: Yes (comment) Required Braces or Orthoses:  (no brace needed per orders) Restrictions Weight Bearing Restrictions: No      Mobility  Bed Mobility Overal bed mobility: Needs Assistance Bed Mobility: Rolling;Sidelying to Sit Rolling: Supervision Sidelying to sit: Supervision            Transfers Overall transfer level: Needs assistance Equipment  used: Rolling walker (2 wheels) Transfers: Sit to/from Stand Sit to Stand: Supervision                Ambulation/Gait Ambulation/Gait assistance: Supervision Gait Distance (Feet): 100 Feet Assistive device: Rolling walker (2 wheels) Gait Pattern/deviations: Step-through pattern Gait velocity: functional Gait velocity interpretation: 1.31 - 2.62 ft/sec, indicative of limited community ambulator   General Gait Details: pt with slowed step-through gait  Stairs Stairs: Yes Stairs assistance: Supervision Stair Management: One rail Right;Step to pattern Number of Stairs: 3    Wheelchair Mobility    Modified Rankin (Stroke Patients Only)       Balance Overall balance assessment: Needs assistance Sitting-balance support: No upper extremity supported;Feet supported Sitting balance-Leahy Scale: Good     Standing balance support: Reliant on assistive device for balance Standing balance-Leahy Scale: Poor                               Pertinent Vitals/Pain Pain Assessment: 0-10 Pain Score: 4  Pain Location: back and LE Pain Descriptors / Indicators: Aching Pain Intervention(s): Monitored during session    Home Living Family/patient expects to be discharged to:: Private residence Living Arrangements: Spouse/significant other;Children Available Help at Discharge: Family;Available 24 hours/day Type of Home: House Home Access: Stairs to enter Entrance Stairs-Rails: Right Entrance Stairs-Number of Steps: 3   Home Layout: Two level;Able to live on main level with bedroom/bathroom Home Equipment: Rollator (4 wheels);Wheelchair - manual;Toilet riser;Shower seat      Prior Function Prior Level of Function : Independent/Modified Independent;Working/employed;Driving             Mobility Comments: pt is typically independent, ambulation and mobility tolerance has been significantly  limited over the past few months due to LE pain       Hand Dominance         Extremity/Trunk Assessment   Upper Extremity Assessment Upper Extremity Assessment: Overall WFL for tasks assessed    Lower Extremity Assessment Lower Extremity Assessment: Generalized weakness    Cervical / Trunk Assessment Cervical / Trunk Assessment: Back Surgery  Communication   Communication: No difficulties  Cognition Arousal/Alertness: Awake/alert Behavior During Therapy: WFL for tasks assessed/performed Overall Cognitive Status: Within Functional Limits for tasks assessed                                          General Comments General comments (skin integrity, edema, etc.): VSS on RA    Exercises     Assessment/Plan    PT Assessment Patient needs continued PT services  PT Problem List Decreased strength;Decreased activity tolerance;Decreased balance;Decreased mobility;Pain       PT Treatment Interventions DME instruction;Gait training;Stair training;Functional mobility training;Therapeutic activities;Therapeutic exercise;Balance training;Neuromuscular re-education;Patient/family education    PT Goals (Current goals can be found in the Care Plan section)  Acute Rehab PT Goals Patient Stated Goal: to return to independence and work PT Goal Formulation: With patient Time For Goal Achievement: 09/24/21 Potential to Achieve Goals: Good    Frequency Min 5X/week   Barriers to discharge        Co-evaluation               AM-PAC PT "6 Clicks" Mobility  Outcome Measure Help needed turning from your back to your side while in a flat bed without using bedrails?: A Little Help needed moving from lying on your back to sitting on the side of a flat bed without using bedrails?: A Little Help needed moving to and from a bed to a chair (including a wheelchair)?: A Little Help needed standing up from a chair using your arms (e.g., wheelchair or bedside chair)?: A Little Help needed to walk in hospital room?: A Little Help needed climbing  3-5 steps with a railing? : A Little 6 Click Score: 18    End of Session   Activity Tolerance: Patient limited by pain Patient left: in bed;with call bell/phone within reach Nurse Communication: Mobility status PT Visit Diagnosis: Other abnormalities of gait and mobility (R26.89);Pain;Muscle weakness (generalized) (M62.81)    Time: 3267-1245 PT Time Calculation (min) (ACUTE ONLY): 13 min   Charges:   PT Evaluation $PT Eval Low Complexity: [place], PT, DPT Acute Rehabilitation Pager: [phone] Office [phone]   Zenaida Niece 09/19/2021, 10:19 AM

## 2021-09-19 NOTE — Evaluation (Signed)
Occupational Therapy Evaluation Patient Details Name: Monique Clark MRN: [id number] DOB: March 26, 1966 Today's Date: 09/19/2021   History of Present Illness 55 y.o. female presents to [facility] hospital on 09/18/2021 with history of R L4-5 HNP. Pt underwent R L4-5 microdiscectomy on 09/18/2021. PMH includes OA, Afib, DM, non Hodgkin's lymphoma, PNA.   Clinical Impression   Patient evaluated by Occupational Therapy with no further acute OT needs identified. All education has been completed and the patient has no further questions. All education completed.   See below for any follow-up Occupational Therapy or equipment needs. OT is signing off. Thank you for this referral.'       Recommendations for follow up therapy are one component of a multi-disciplinary discharge planning process, led by the attending physician.  Recommendations may be updated based on patient status, additional functional criteria and insurance authorization.   Follow Up Recommendations  No OT follow up    Assistance Recommended at Discharge Intermittent Supervision/Assistance  Functional Status Assessment  Patient has had a recent decline in their functional status and demonstrates the ability to make significant improvements in function in a reasonable and predictable amount of time.  Equipment Recommendations  None recommended by OT    Recommendations for Other Services       Precautions / Restrictions Precautions Precautions: Back;Fall Precaution Booklet Issued: Yes (comment) Required Braces or Orthoses:  (no brace needed per orders) Restrictions Weight Bearing Restrictions: No      Mobility Bed Mobility Overal bed mobility: Needs Assistance Bed Mobility: Rolling;Sidelying to Sit Rolling: Supervision Sidelying to sit: Supervision       General bed mobility comments: pt sitting EOB    Transfers Overall transfer level: Needs assistance Equipment used: Rolling walker (2 wheels) Transfers: Sit to/from  Stand;Bed to chair/wheelchair/BSC Sit to Stand: Supervision     Step pivot transfers: Supervision            Balance Overall balance assessment: Needs assistance Sitting-balance support: No upper extremity supported;Feet supported Sitting balance-Leahy Scale: Good     Standing balance support: During functional activity Standing balance-Leahy Scale: Fair                             ADL either performed or assessed with clinical judgement   ADL Overall ADL's : Needs assistance/impaired Eating/Feeding: Independent   Grooming: Wash/dry hands;Wash/dry face;Oral care;Brushing hair;Supervision/safety;Standing Grooming Details (indicate cue type and reason): reviewed safe technique for oral care Upper Body Bathing: Set up;Supervision/ safety;Sitting   Lower Body Bathing: Supervison/ safety;Set up;Sit to/from stand   Upper Body Dressing : Set up;Supervision/safety;Sitting   Lower Body Dressing: Supervision/safety;Sit to/from stand Lower Body Dressing Details (indicate cue type and reason): Pt instructed in use of sock aid to don socks.  She is able to don pants Toilet Transfer: Supervision/safety;Ambulation;Comfort height toilet   Toileting- Clothing Manipulation and Hygiene: Minimal assistance;Sit to/from stand Toileting - Clothing Manipulation Details (indicate cue type and reason): Pt unable to access peri area.  Discussed options for toileting aid for home use     Functional mobility during ADLs: Supervision/safety       Vision         Perception     Praxis      Pertinent Vitals/Pain Pain Assessment: Faces Pain Score: 4  Faces Pain Scale: Hurts little more Pain Location: back and LE Pain Descriptors / Indicators: Aching Pain Intervention(s): Monitored during session     Hand Dominance Right   Extremity/Trunk Assessment  Upper Extremity Assessment Upper Extremity Assessment: Overall WFL for tasks assessed   Lower Extremity Assessment Lower  Extremity Assessment: Defer to PT evaluation   Cervical / Trunk Assessment Cervical / Trunk Assessment: Back Surgery   Communication Communication Communication: No difficulties   Cognition Arousal/Alertness: Awake/alert Behavior During Therapy: WFL for tasks assessed/performed Overall Cognitive Status: Within Functional Limits for tasks assessed                                       General Comments  VSS on RA    Exercises     Shoulder Instructions      Home Living Family/patient expects to be discharged to:: Private residence Living Arrangements: Spouse/significant other;Children Available Help at Discharge: Family;Available 24 hours/day Type of Home: House Home Access: Stairs to enter [employer] of Steps: 3 Entrance Stairs-Rails: Right Home Layout: Two level;Able to live on main level with bedroom/bathroom     Bathroom Shower/Tub: Tub/shower unit         Home Equipment: Rollator (4 wheels);Wheelchair - manual;Toilet riser;Shower seat          Prior Functioning/Environment Prior Level of Function : Working/employed;Driving;Needs assist             Mobility Comments: pt is typically independent, ambulation and mobility tolerance has been significantly limited over the past few months due to LE pain ADLs Comments: Pt reports family has been assisting with LB ADLs intermittently and that she has struggled to complete peri care        OT Problem List: Decreased activity tolerance;Decreased knowledge of use of DME or AE;Decreased knowledge of precautions;Obesity;Pain      OT Treatment/Interventions:      OT Goals(Current goals can be found in the care plan section) Acute Rehab OT Goals Patient Stated Goal: to be able to return to work and be able to do more activities with her children OT Goal Formulation: All assessment and education complete, DC therapy  OT Frequency:     Barriers to D/C:            Co-evaluation               AM-PAC OT "6 Clicks" Daily Activity     Outcome Measure Help from another person eating meals?: None Help from another person taking care of personal grooming?: A Little Help from another person toileting, which includes using toliet, bedpan, or urinal?: A Little Help from another person bathing (including washing, rinsing, drying)?: A Little Help from another person to put on and taking off regular upper body clothing?: A Little Help from another person to put on and taking off regular lower body clothing?: A Little 6 Click Score: 19   End of Session Equipment Utilized During Treatment: Rolling walker (2 wheels) Nurse Communication: Mobility status  Activity Tolerance: Patient tolerated treatment well Patient left: in bed (EOB)  OT Visit Diagnosis: Pain Pain - part of body:  (back)                Time: 7425-9563 OT Time Calculation (min): 16 min Charges:  OT General Charges $OT Visit: 1 Visit OT Evaluation $OT Eval Low Complexity: 1 Low  Monique Clark., OTR/L Acute Rehabilitation Services Pager [phone] Office [phone]   Monique Clark 09/19/2021, 11:01 AM

## 2021-09-19 NOTE — Plan of Care (Signed)

## 2021-09-19 NOTE — Progress Notes (Signed)
Patient awaiting transport via wheelchair by volunteer for discharge home; in no acute distress nor complaints of pain nor discomfort; incision on her back was recently changed with honeycomb dressing and was clean, dry and intact; room was checked and accounted for all her belongings; discharge instructions concerning her medications, incision care, follow up appointment and when to call the doctor were all discussed with patient by RN and she verbalized understanding on the instructions given.

## 2021-09-19 NOTE — Progress Notes (Signed)
Patient ID: Monique Clark, female   DOB: 01-08-1966, 55 y.o.   MRN: [id number]   Subjective: 1 Day Post-Op Procedure(s) (LRB): RIGHT LUMBAR FOUR-FIVE MICRODISCECTOMY (N/A) Patient reports pain as mild and moderate.  Walking in hall. Leg pain gone. Has walker at home.   Objective: Vital signs in last 24 hours: Temp:  [97.8 F (36.6 C)-98.7 F (37.1 C)] 98.7 F (37.1 C) (12/22 0741) Pulse Rate:  [59-76] 64 (12/22 0741) Resp:  [12-22] 18 (12/22 0741) BP: (97-146)/(57-87) 112/65 (12/22 0741) SpO2:  [88 %-97 %] 88 % (12/22 0741) Weight:  [147 kg] 147 kg (12/21 1043)  Intake/Output from previous day: 12/21 0701 - 12/22 0700 In: 1940 [P.O.:240; I.V.:1500; IV Piggyback:200] Out: 100 [Blood:100] Intake/Output this shift: No intake/output data recorded.  Recent Labs    09/16/21 1454  HGB 12.6   Recent Labs    09/16/21 1454  WBC 12.5*  RBC 4.62  HCT 40.8  PLT 397   Recent Labs    09/16/21 1454  NA 137  K 4.0  CL 102  CO2 27  BUN 15  CREATININE 0.91  GLUCOSE 90  CALCIUM 9.5   No results for input(s): LABPT, INR in the last 72 hours.  Neurologically intact DG Lumbar Spine 2-3 Views  Result Date: 09/18/2021 CLINICAL DATA:  Localization. EXAM: LUMBAR SPINE - 2-3 VIEW COMPARISON:  Lumbar spine x-ray 05/28/2021. FINDINGS: Instrumentation overlies the posterior elements at the L4-L5 level. Alignment is anatomic. There is no fracture. Disc space narrowing and endplate osteophyte formation is stable compatible with degenerative change. IMPRESSION: 1.  Localization as above. Electronically Signed   By: Ronney Asters M.D.   On: 09/18/2021 16:06    Assessment/Plan: 1 Day Post-Op Procedure(s) (LRB): RIGHT LUMBAR FOUR-FIVE MICRODISCECTOMY (N/A) Up with therapy this AM then discharge home.   Marybelle Killings 09/19/2021, 7:46 AM

## 2021-09-22 DIAGNOSIS — D801 Nonfamilial hypogammaglobulinemia: Secondary | ICD-10-CM | POA: Diagnosis not present

## 2021-09-27 ENCOUNTER — Ambulatory Visit (INDEPENDENT_AMBULATORY_CARE_PROVIDER_SITE_OTHER): Payer: 59 | Admitting: Orthopaedic Surgery

## 2021-09-27 ENCOUNTER — Other Ambulatory Visit: Payer: Self-pay

## 2021-09-27 ENCOUNTER — Encounter: Payer: Self-pay | Admitting: Orthopaedic Surgery

## 2021-09-27 DIAGNOSIS — Z9889 Other specified postprocedural states: Secondary | ICD-10-CM | POA: Insufficient documentation

## 2021-09-27 NOTE — Progress Notes (Signed)
Post-Op Visit Note   Patient: Monique Clark           Date of Birth: 29-Dec-1965           MRN: [id number] Visit Date: 09/27/2021 PCP: Hoyt Koch, MD   Assessment & Plan: Follow-up right L4-5 microdiscectomy.  Incision looks good she will return in 5 days for staple removal.  She will avoid sitting position neuro walking daily.  Incision looks good good relief of preop pain she still has about half bottle pain medication left.  Recheck 5 weeks for staple removal and Steri-Strip application.  Chief Complaint:  Chief Complaint  Patient presents with   Lower Back - Routine Post Op    09/18/2021 Right L4-5 microdiscectomy   Visit Diagnoses:  1. S/P lumbar microdiscectomy     Plan: Return in 5 days for staple staple removal.  Follow-Up Instructions: Return in about 5 days (around 10/02/2021).   Orders:  No orders of the defined types were placed in this encounter.  No orders of the defined types were placed in this encounter.   Imaging: No results found.  PMFS History: Patient Active Problem List   Diagnosis Date Noted   S/P lumbar microdiscectomy 09/27/2021   HNP (herniated nucleus pulposus), lumbar 09/18/2021   Lumbar disc herniation with radiculopathy 06/26/2021   Insomnia 05/28/2021   Elevated serum hCG 07/02/2020   Paroxysmal atrial fibrillation (HCC)    Localized swelling of left lower extremity    Lightheadedness    Essential hypertension    History of bilateral total hip arthroplasty 07/29/2018   Morbid obesity with body mass index (BMI) of 50.0 to 59.9 in adult [facility]) 12/28/2015   CVID (common variable immunodeficiency) ([place]) 05/07/2015   Post-nasal discharge 03/30/2013   Prediabetes 01/20/2013   S/P splenectomy 01/20/2013   History of non-Hodgkin's lymphoma 12/21/2012   Low back pain radiating to right lower extremity 12/10/2012   Right lumbar radiculitis 12/10/2012   Past Medical History:  Diagnosis Date   Abscess    Arthritis    Atrial  fibrillation ([place])    Cardiac arrhythmia due to congenital heart disease    trace MR 08/15/20 echo ([place])   CVID (common variable immunodeficiency) ([place]) 2015   Diabetes mellitus without complication (HCC)    Diverticulosis    Elevated cholesterol    GERD (gastroesophageal reflux disease)    High blood pressure    Non Hodgkin's lymphoma ([place]) 1998   Obesity    PMR (polymyalgia rheumatica) (HCC)    Pneumonia    PONV (postoperative nausea and vomiting)    POTS (postural orthostatic tachycardia syndrome)    Pre-diabetes    UTI (urinary tract infection)     Family History  Problem Relation Age of Onset   Atrial fibrillation Mother    Cancer Father        Colon   Atrial fibrillation Maternal Uncle    Atrial fibrillation Maternal Grandmother    Atrial fibrillation Maternal Grandfather    Heart attack Maternal Grandfather    Ovarian cancer Neg Hx     Past Surgical History:  Procedure Laterality Date   ABDOMINAL HYSTERECTOMY     APPENDECTOMY     CESAREAN SECTION     GALLBLADDER SURGERY     JOINT REPLACEMENT Left    hip   JOINT REPLACEMENT Right    Right Hip 2022   left shoulder repair     LUMBAR LAMINECTOMY N/A 09/18/2021   Procedure: RIGHT LUMBAR FOUR-FIVE MICRODISCECTOMY;  Surgeon: Lorin Mercy,  Thana Farr, MD;  Location: [place];  Service: Orthopedics;  Laterality: N/A;   LUNG REMOVAL, PARTIAL     myringostomy Bilateral    SPLENECTOMY, TOTAL  1998   TONSILLECTOMY AND ADENOIDECTOMY     Social History   Occupational History   Occupation: rn  Tobacco Use   Smoking status: Former    Packs/day: 0.50    Types: Cigarettes    Quit date: 09/29/2002    Years since quitting: 19.0   Smokeless tobacco: Never  Vaping Use   Vaping Use: Never used  Substance and Sexual Activity   Alcohol use: No   Drug use: No   Sexual activity: Yes    Birth control/protection: Surgical, Post-menopausal    Comment: 1st intercourse 46 yo-5 partners

## 2021-09-27 NOTE — Progress Notes (Signed)
55 year old female with history of right L4-5 HNP comes in for preop evaluation.  States that symptoms unchanged from previous visit.  She is wanting to proceed with right L4-5 microdiscectomy as scheduled.  History and physical performed.  Review of systems negative.  We have received preop cardiac clearance.  Patient is on Xarelto for atrial fibrillation and knows to stop this medication 2 days before surgery.  Surgical procedure discussed along with potential recovery time.  All questions answered.

## 2021-09-29 DIAGNOSIS — D801 Nonfamilial hypogammaglobulinemia: Secondary | ICD-10-CM | POA: Diagnosis not present

## 2021-09-30 ENCOUNTER — Other Ambulatory Visit (HOSPITAL_COMMUNITY): Payer: Self-pay

## 2021-09-30 MED FILL — Flecainide Acetate Tab 50 MG: ORAL | 90 days supply | Qty: 180 | Fill #1 | Status: AC

## 2021-10-01 ENCOUNTER — Other Ambulatory Visit (HOSPITAL_COMMUNITY): Payer: Self-pay

## 2021-10-01 MED ORDER — NEBIVOLOL HCL 2.5 MG PO TABS
2.5000 mg | ORAL_TABLET | Freq: Every day | ORAL | 1 refills | Status: DC
  Filled 2021-10-01: qty 90, 90d supply, fill #0
  Filled 2021-12-11: qty 90, 90d supply, fill #1

## 2021-10-01 NOTE — Discharge Summary (Signed)
Patient ID: Monique Clark MRN: [id number] DOB/AGE: 1966-02-21 56 y.o.  Admit date: 09/18/2021 Discharge date: 09/19/2021  Admission Diagnoses:  Active Problems:   * No active hospital problems. *   Discharge Diagnoses:  Active Problems:   * No active hospital problems. *  status post Procedure(s): RIGHT LUMBAR FOUR-FIVE MICRODISCECTOMY  Past Medical History:  Diagnosis Date   Abscess    Arthritis    Atrial fibrillation ([place])    Cardiac arrhythmia due to congenital heart disease    trace MR 08/15/20 echo ([place])   CVID (common variable immunodeficiency) ([place]) 2015   Diabetes mellitus without complication (HCC)    Diverticulosis    Elevated cholesterol    GERD (gastroesophageal reflux disease)    High blood pressure    Non Hodgkin's lymphoma ([place]) 1998   Obesity    PMR (polymyalgia rheumatica) (HCC)    Pneumonia    PONV (postoperative nausea and vomiting)    POTS (postural orthostatic tachycardia syndrome)    Pre-diabetes    UTI (urinary tract infection)     Surgeries: Procedure(s): RIGHT LUMBAR FOUR-FIVE MICRODISCECTOMY on 09/18/2021   Consultants:   Discharged Condition: Improved  Hospital Course: Monique Clark is an 56 y.o. female who was admitted 09/18/2021 for operative treatment of HNP (herniated nucleus pulposus), lumbar. Patient failed conservative treatments (please see the history and physical for the specifics) and had severe unremitting pain that affects sleep, daily activities and work/hobbies. After pre-op clearance, the patient was taken to the operating room on 09/18/2021 and underwent  Procedure(s): RIGHT LUMBAR FOUR-FIVE MICRODISCECTOMY.    Patient was given perioperative antibiotics:  Anti-infectives (From admission, onward)    Start     Dose/Rate Route Frequency Ordered Stop   09/19/21 1000  fluconazole (DIFLUCAN) tablet 50 mg  Status:  Discontinued        50 mg Oral Daily 09/19/21 0750 09/19/21 1753   09/18/21 2100   ceFAZolin (ANCEF) IVPB 2g/100 mL premix        2 g 200 mL/hr over 30 Minutes Intravenous Every 8 hours 09/18/21 1631 09/19/21 0438   09/18/21 1030  ceFAZolin (ANCEF) IVPB 3g/100 mL premix        3 g 200 mL/hr over 30 Minutes Intravenous On call to O.R. 09/18/21 1027 09/18/21 1300   09/18/21 1025  ceFAZolin (ANCEF) 2-4 GM/100ML-% IVPB  Status:  Discontinued       Note to Pharmacy: Cameron Sprang M: cabinet override      09/18/21 1025 09/18/21 1040        Patient was given sequential compression devices and early ambulation to prevent DVT.   Patient benefited maximally from hospital stay and there were no complications. At the time of discharge, the patient was urinating/moving their bowels without difficulty, tolerating a regular diet, pain is controlled with oral pain medications and they have been cleared by PT/OT.   Recent vital signs: No data found.   Recent laboratory studies: No results for input(s): WBC, HGB, HCT, PLT, NA, K, CL, CO2, BUN, CREATININE, GLUCOSE, INR, CALCIUM in the last 72 hours.  Invalid input(s): PT, 2   Discharge Medications:   Allergies as of 09/19/2021       Reactions   Nizatidine Hives   Axid   Promethazine Other (See Comments)   High doses causes confusion   Tetanus Toxoids Other (See Comments)   angioedema        Medication List     STOP taking these medications    cyclobenzaprine 10 MG  tablet Commonly known as: FLEXERIL   ibuprofen 200 MG tablet Commonly known as: ADVIL   predniSONE 20 MG tablet Commonly known as: DELTASONE   traMADol 50 MG tablet Commonly known as: ULTRAM       TAKE these medications    albuterol 108 (90 Base) MCG/ACT inhaler Commonly known as: VENTOLIN HFA Inhale 2 puffs into the lungs every 6 (six) hours as needed for shortness of breath or wheezing.   diphenhydrAMINE 25 MG tablet Commonly known as: BENADRYL Take 50 mg by mouth See admin instructions. Take 2 tablets (50 mg) by mouth at bedtime as  needed for sleep, and take 2 tablets (50 mg) with each dose of Hizentra   DULoxetine 60 MG capsule Commonly known as: Cymbalta Take 1 capsule (60 mg total) by mouth daily.   flecainide 50 MG tablet Commonly known as: TAMBOCOR TAKE 1 TABLET BY MOUTH EVERY 12 HOURS   flecainide 100 MG tablet Commonly known as: TAMBOCOR Take 1 tablet by mouth every 12 hours   gabapentin 100 MG capsule Commonly known as: NEURONTIN Take 1 capsule by mouth at bedtime, may increase to 1 capsule by mouth three times daily if needed. What changed:  how much to take how to take this when to take this   Immune Globulin (Human) 4 GM/20ML Soln Inject 36 g into the skin every 14 (fourteen) days. Monday   methocarbamol 500 MG tablet Commonly known as: Robaxin Take 1 tablet (500 mg total) by mouth 4 (four) times daily. What changed:  medication strength how much to take when to take this reasons to take this   multivitamin with minerals Tabs tablet Take 1 tablet by mouth daily.   nebivolol 2.5 MG tablet Commonly known as: BYSTOLIC TAKE 1 TABLET BY MOUTH ONCE DAILY   oxyCODONE-acetaminophen 5-325 MG tablet Commonly known as: Percocet Take 1 tablet by mouth every 4 (four) hours as needed for severe pain.   rosuvastatin 10 MG tablet Commonly known as: CRESTOR Take 1 tablet (10 mg total) by mouth daily at 6 PM.   Unifine Pentips 32G X 4 MM Misc Generic drug: Insulin Pen Needle Use as directed with Saxenda once daily   Vitamin D3 1.25 MG (50000 UT) Caps TAKE 1 CAPSULE BY MOUTH TWICE A WEEK AS DIRECTED   Xarelto 20 MG Tabs tablet Generic drug: rivaroxaban TAKE 1 TABLET BY MOUTH DAILY   zolpidem 5 MG tablet Commonly known as: Ambien Take 1 tablet by mouth at bedtime as needed for sleep. What changed: when to take this        Diagnostic Studies: DG Lumbar Spine 2-3 Views  Result Date: 09/18/2021 CLINICAL DATA:  Localization. EXAM: LUMBAR SPINE - 2-3 VIEW COMPARISON:  Lumbar spine x-ray  05/28/2021. FINDINGS: Instrumentation overlies the posterior elements at the L4-L5 level. Alignment is anatomic. There is no fracture. Disc space narrowing and endplate osteophyte formation is stable compatible with degenerative change. IMPRESSION: 1.  Localization as above. Electronically Signed   By: Ronney Asters M.D.   On: 09/18/2021 16:06    Discharge Instructions     Incentive spirometry RT   Complete by: As directed         Follow-up Information     Marybelle Killings, MD. Schedule an appointment as soon as possible for a visit in 1 week(s).   Specialty: Orthopedic Surgery Why: NEED RETURN OFFICE VISIT ONE WEEK POSTOP.  CALL TO SCHEDULE. Contact information: [address] [place] [address] [phone]  Discharge Plan:  discharge to home  Disposition:     Signed: Benjiman Core  10/01/2021, 9:28 AM

## 2021-10-02 ENCOUNTER — Other Ambulatory Visit: Payer: Self-pay

## 2021-10-02 ENCOUNTER — Other Ambulatory Visit (HOSPITAL_COMMUNITY): Payer: Self-pay

## 2021-10-02 ENCOUNTER — Ambulatory Visit (INDEPENDENT_AMBULATORY_CARE_PROVIDER_SITE_OTHER): Payer: 59 | Admitting: Orthopaedic Surgery

## 2021-10-02 ENCOUNTER — Encounter: Payer: Self-pay | Admitting: Orthopaedic Surgery

## 2021-10-02 VITALS — BP 139/88 | Ht 64.0 in | Wt 324.0 lb

## 2021-10-02 DIAGNOSIS — Z9889 Other specified postprocedural states: Secondary | ICD-10-CM

## 2021-10-02 MED ORDER — OXYCODONE-ACETAMINOPHEN 5-325 MG PO TABS
1.0000 | ORAL_TABLET | ORAL | 0 refills | Status: DC | PRN
  Filled 2021-10-02: qty 20, 4d supply, fill #0

## 2021-10-02 NOTE — Progress Notes (Signed)
Post-Op Visit Note   Patient: Monique Clark           Date of Birth: 27-Feb-1966           MRN: [id number] Visit Date: 10/02/2021 PCP: Hoyt Koch, MD   Assessment & Plan: Postop microdiscectomy right L4-5 good relief preop pain incision looks good staples are harvested Steri-Strips applied.  Continue walking program, avoid bending lifting.  Recheck 4 weeks.  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  Chief Complaint:  Chief Complaint  Patient presents with   Lower Back - Routine Post Op, Follow-up    09/18/2021 Right L4-5 microdiscectomy   Visit Diagnoses:  1. S/P lumbar microdiscectomy     Plan:  Recheck 4 weeks.  Percocet 20 tablets filled out she is only taking this primarily at night.  She will call if she has problems. Follow-Up Instructions: Return in about 4 weeks (around 10/30/2021).   Orders:  No orders of the defined types were placed in this encounter.  Meds ordered this encounter  Medications   oxyCODONE-acetaminophen (PERCOCET) 5-325 MG tablet    Sig: Take 1 tablet by mouth every 4 (four) hours as needed for severe pain.    Dispense:  20 tablet    Refill:  0    Post op pain    Imaging: No results found.  PMFS History: Patient Active Problem List   Diagnosis Date Noted   S/P lumbar microdiscectomy 09/27/2021   Insomnia 05/28/2021   Elevated serum hCG 07/02/2020   Paroxysmal atrial fibrillation (HCC)    Localized swelling of left lower extremity    Lightheadedness    Essential hypertension    History of bilateral total hip arthroplasty 07/29/2018   Morbid obesity with body mass index (BMI) of 50.0 to 59.9 in adult [facility]) 12/28/2015   CVID (common variable immunodeficiency) ([employer]) 05/07/2015   Post-nasal discharge 03/30/2013   Prediabetes 01/20/2013   S/P splenectomy 01/20/2013   History of non-Hodgkin's lymphoma 12/21/2012   Low back pain radiating to right lower extremity 12/10/2012   Right lumbar radiculitis  12/10/2012   Past Medical History:  Diagnosis Date   Abscess    Arthritis    Atrial fibrillation ([place])    Cardiac arrhythmia due to congenital heart disease    trace MR 08/15/20 echo ([place])   CVID (common variable immunodeficiency) ([place]) 2015   Diabetes mellitus without complication (HCC)    Diverticulosis    Elevated cholesterol    GERD (gastroesophageal reflux disease)    High blood pressure    Non Hodgkin's lymphoma ([place]) 1998   Obesity    PMR (polymyalgia rheumatica) (HCC)    Pneumonia    PONV (postoperative nausea and vomiting)    POTS (postural orthostatic tachycardia syndrome)    Pre-diabetes    UTI (urinary tract infection)     Family History  Problem Relation Age of Onset   Atrial fibrillation Mother    Cancer Father        Colon   Atrial fibrillation Maternal Uncle    Atrial fibrillation Maternal Grandmother    Atrial fibrillation Maternal Grandfather    Heart attack Maternal Grandfather    Ovarian cancer Neg Hx     Past Surgical History:  Procedure Laterality Date   ABDOMINAL HYSTERECTOMY     APPENDECTOMY     CESAREAN SECTION     GALLBLADDER SURGERY     JOINT REPLACEMENT Left    hip   JOINT REPLACEMENT Right  Right Hip 2022   left shoulder repair     LUMBAR LAMINECTOMY N/A 09/18/2021   Procedure: RIGHT LUMBAR FOUR-FIVE MICRODISCECTOMY;  Surgeon: Marybelle Killings, MD;  Location: [place];  Service: Orthopedics;  Laterality: N/A;   LUNG REMOVAL, PARTIAL     myringostomy Bilateral    SPLENECTOMY, TOTAL  1998   TONSILLECTOMY AND ADENOIDECTOMY     Social History   Occupational History   Occupation: rn  Tobacco Use   Smoking status: Former    Packs/day: 0.50    Types: Cigarettes    Quit date: 09/29/2002    Years since quitting: 19.0   Smokeless tobacco: Never  Vaping Use   Vaping Use: Never used  Substance and Sexual Activity   Alcohol use: No   Drug use: No   Sexual activity: Yes    Birth control/protection: Surgical, Post-menopausal     Comment: 1st intercourse 52 yo-5 partners

## 2021-10-21 DIAGNOSIS — D801 Nonfamilial hypogammaglobulinemia: Secondary | ICD-10-CM | POA: Diagnosis not present

## 2021-10-23 ENCOUNTER — Other Ambulatory Visit: Payer: Self-pay | Admitting: Internal Medicine

## 2021-10-23 ENCOUNTER — Other Ambulatory Visit (HOSPITAL_COMMUNITY): Payer: Self-pay

## 2021-10-24 ENCOUNTER — Other Ambulatory Visit (HOSPITAL_COMMUNITY): Payer: Self-pay

## 2021-10-24 ENCOUNTER — Encounter: Payer: Self-pay | Admitting: Internal Medicine

## 2021-10-25 ENCOUNTER — Other Ambulatory Visit (HOSPITAL_COMMUNITY): Payer: Self-pay

## 2021-10-25 MED ORDER — FLUCONAZOLE 150 MG PO TABS
150.0000 mg | ORAL_TABLET | ORAL | 0 refills | Status: DC
  Filled 2021-10-25: qty 3, 9d supply, fill #0

## 2021-10-29 ENCOUNTER — Other Ambulatory Visit: Payer: Self-pay | Admitting: Internal Medicine

## 2021-10-29 ENCOUNTER — Other Ambulatory Visit (HOSPITAL_COMMUNITY): Payer: Self-pay

## 2021-10-29 NOTE — Telephone Encounter (Signed)
I see that she has been on oxycodone for back since last visit. Is she still taking that? She should not take [place] with this so we need clarification before we can fill

## 2021-10-30 ENCOUNTER — Encounter: Payer: Self-pay | Admitting: Orthopaedic Surgery

## 2021-10-30 ENCOUNTER — Encounter: Payer: Self-pay | Admitting: Internal Medicine

## 2021-10-30 ENCOUNTER — Ambulatory Visit: Payer: 59 | Admitting: Orthopaedic Surgery

## 2021-10-30 ENCOUNTER — Other Ambulatory Visit: Payer: Self-pay

## 2021-10-30 ENCOUNTER — Other Ambulatory Visit (HOSPITAL_COMMUNITY): Payer: Self-pay

## 2021-10-30 VITALS — BP 159/95 | HR 69 | Ht 64.0 in | Wt 324.0 lb

## 2021-10-30 DIAGNOSIS — Z9889 Other specified postprocedural states: Secondary | ICD-10-CM

## 2021-10-30 NOTE — Progress Notes (Signed)
Post-Op Visit Note   Patient: Monique Clark           Date of Birth: May 22, 1966           MRN: [id number] Visit Date: 10/30/2021 PCP: Hoyt Koch, MD   Assessment & Plan: Patient returns post L4-5 microdiscectomy on the right.  She states she is having increased aching pain in her shoulders arms hands back and legs consistent with her PMR flare.  Surgery date was 09/18/2021 and now she is 6 weeks out from surgery.  She has been out of work and was to resume work on 11/13/2021.  Patient's PCP had been hesitant to restart the prednisone and wanted to make sure was okay with [place].  I discussed patient from my standpoint she is far enough out from surgery.  Incision looks good.  Chief Complaint:  Chief Complaint  Patient presents with   Lower Back - Routine Post Op   Visit Diagnoses:  1. S/P lumbar microdiscectomy     Plan: Recheck 1 month.  She will call if she needs a work note.  Follow-Up Instructions: No follow-ups on file.   Orders:  No orders of the defined types were placed in this encounter.  No orders of the defined types were placed in this encounter.   Imaging: No results found.  PMFS History: Patient Active Problem List   Diagnosis Date Noted   S/P lumbar microdiscectomy 09/27/2021   Insomnia 05/28/2021   Elevated serum hCG 07/02/2020   Paroxysmal atrial fibrillation (HCC)    Localized swelling of left lower extremity    Lightheadedness    Essential hypertension    History of bilateral total hip arthroplasty 07/29/2018   Morbid obesity with body mass index (BMI) of 50.0 to 59.9 in adult [facility]) 12/28/2015   CVID (common variable immunodeficiency) ([place]) 05/07/2015   Post-nasal discharge 03/30/2013   Prediabetes 01/20/2013   S/P splenectomy 01/20/2013   History of non-Hodgkin's lymphoma 12/21/2012   Low back pain radiating to right lower extremity 12/10/2012   Right lumbar radiculitis 12/10/2012   Past Medical History:  Diagnosis Date   Abscess     Arthritis    Atrial fibrillation ([place])    Cardiac arrhythmia due to congenital heart disease    trace MR 08/15/20 echo ([place])   CVID (common variable immunodeficiency) ([place]) 2015   Diabetes mellitus without complication (HCC)    Diverticulosis    Elevated cholesterol    GERD (gastroesophageal reflux disease)    High blood pressure    Non Hodgkin's lymphoma ([place]) 1998   Obesity    PMR (polymyalgia rheumatica) (HCC)    Pneumonia    PONV (postoperative nausea and vomiting)    POTS (postural orthostatic tachycardia syndrome)    Pre-diabetes    UTI (urinary tract infection)     Family History  Problem Relation Age of Onset   Atrial fibrillation Mother    Cancer Father        Colon   Atrial fibrillation Maternal Uncle    Atrial fibrillation Maternal Grandmother    Atrial fibrillation Maternal Grandfather    Heart attack Maternal Grandfather    Ovarian cancer Neg Hx     Past Surgical History:  Procedure Laterality Date   ABDOMINAL HYSTERECTOMY     APPENDECTOMY     CESAREAN SECTION     GALLBLADDER SURGERY     JOINT REPLACEMENT Left    hip   JOINT REPLACEMENT Right    Right Hip 2022   left  shoulder repair     LUMBAR LAMINECTOMY N/A 09/18/2021   Procedure: RIGHT LUMBAR FOUR-FIVE MICRODISCECTOMY;  Surgeon: Marybelle Killings, MD;  Location: [place];  Service: Orthopedics;  Laterality: N/A;   LUNG REMOVAL, PARTIAL     myringostomy Bilateral    SPLENECTOMY, TOTAL  1998   TONSILLECTOMY AND ADENOIDECTOMY     Social History   Occupational History   Occupation: rn  Tobacco Use   Smoking status: Former    Packs/day: 0.50    Types: Cigarettes    Quit date: 09/29/2002    Years since quitting: 19.0   Smokeless tobacco: Never  Vaping Use   Vaping Use: Never used  Substance and Sexual Activity   Alcohol use: No   Drug use: No   Sexual activity: Yes    Birth control/protection: Surgical, Post-menopausal    Comment: 1st intercourse 29 yo-5 partners

## 2021-10-31 ENCOUNTER — Other Ambulatory Visit (HOSPITAL_COMMUNITY): Payer: Self-pay

## 2021-11-04 ENCOUNTER — Encounter: Payer: Self-pay | Admitting: Internal Medicine

## 2021-11-04 ENCOUNTER — Ambulatory Visit: Payer: 59 | Admitting: Internal Medicine

## 2021-11-04 ENCOUNTER — Other Ambulatory Visit (HOSPITAL_COMMUNITY): Payer: Self-pay

## 2021-11-04 ENCOUNTER — Other Ambulatory Visit: Payer: Self-pay

## 2021-11-04 VITALS — BP 130/72 | HR 63 | Resp 18 | Ht 64.0 in | Wt 323.2 lb

## 2021-11-04 DIAGNOSIS — H60503 Unspecified acute noninfective otitis externa, bilateral: Secondary | ICD-10-CM | POA: Diagnosis not present

## 2021-11-04 DIAGNOSIS — M353 Polymyalgia rheumatica: Secondary | ICD-10-CM | POA: Diagnosis not present

## 2021-11-04 DIAGNOSIS — R7303 Prediabetes: Secondary | ICD-10-CM | POA: Diagnosis not present

## 2021-11-04 DIAGNOSIS — D839 Common variable immunodeficiency, unspecified: Secondary | ICD-10-CM | POA: Diagnosis not present

## 2021-11-04 MED ORDER — PREDNISONE 20 MG PO TABS
ORAL_TABLET | ORAL | 0 refills | Status: DC
  Filled 2021-11-04: qty 40, 30d supply, fill #0

## 2021-11-04 MED ORDER — NEOMYCIN-POLYMYXIN-HC 3.5-10000-1 OT SUSP
3.0000 [drp] | Freq: Three times a day (TID) | OTIC | 0 refills | Status: DC
  Filled 2021-11-04: qty 10, 8d supply, fill #0

## 2021-11-04 NOTE — Progress Notes (Signed)
° °  Subjective:   Patient ID: Monique Clark, female    DOB: January 23, 1966, 56 y.o.   MRN: [id number]  HPI The patient is a 56 YO female coming in for concerns about muscle pain and weakness. Upper arms and shoulders as well as legs sometimes. Previously had and was treated for PMR with extended course steroids. Is taking steroids recently through back provider. Has felt markedly improved.   Review of Systems  Constitutional:  Positive for activity change.  HENT:  Positive for ear pain.   Eyes: Negative.   Respiratory:  Negative for cough, chest tightness and shortness of breath.   Cardiovascular:  Negative for chest pain, palpitations and leg swelling.  Gastrointestinal:  Negative for abdominal distention, abdominal pain, constipation, diarrhea, nausea and vomiting.  Musculoskeletal:  Positive for arthralgias, back pain and myalgias.  Skin: Negative.   Neurological:  Positive for tremors.  Psychiatric/Behavioral: Negative.     Objective:  Physical Exam Constitutional:      Appearance: She is well-developed.  HENT:     Head: Normocephalic and atraumatic.     Ears:     Comments: TMs bulging slightly bilaterally clear fluid Cardiovascular:     Rate and Rhythm: Normal rate and regular rhythm.  Pulmonary:     Effort: Pulmonary effort is normal. No respiratory distress.     Breath sounds: Normal breath sounds. No wheezing or rales.  Abdominal:     General: Bowel sounds are normal. There is no distension.     Palpations: Abdomen is soft.     Tenderness: There is no abdominal tenderness. There is no rebound.  Musculoskeletal:        General: Tenderness present.     Cervical back: Normal range of motion.  Skin:    General: Skin is warm and dry.  Neurological:     Mental Status: She is alert and oriented to person, place, and time.     Coordination: Coordination normal.    Vitals:   11/04/21 1012  BP: 130/72  Pulse: 63  Resp: 18  SpO2: 99%  Weight: (!) 323 lb 3.2 oz (146.6 kg)   Height: 5\' 4"  (1.626 m)    This visit occurred during the SARS-CoV-2 public health emergency.  Safety protocols were in place, including screening questions prior to the visit, additional usage of staff PPE, and extensive cleaning of exam room while observing appropriate contact time as indicated for disinfecting solutions.   Assessment & Plan:

## 2021-11-04 NOTE — Patient Instructions (Signed)
We will get you in with the rheumatologist for the PMR to find a better long term option. I have sent in the prednisone to get you through until then.  We have sent in the ear drops to use 3 drops 3 times a day for 3-5 days.

## 2021-11-05 ENCOUNTER — Encounter: Payer: Self-pay | Admitting: Internal Medicine

## 2021-11-05 DIAGNOSIS — H6090 Unspecified otitis externa, unspecified ear: Secondary | ICD-10-CM | POA: Insufficient documentation

## 2021-11-05 DIAGNOSIS — M353 Polymyalgia rheumatica: Secondary | ICD-10-CM | POA: Insufficient documentation

## 2021-11-05 NOTE — Assessment & Plan Note (Signed)
She is still getting IVIG and overall stable. With recent ear infection.

## 2021-11-05 NOTE — Assessment & Plan Note (Signed)
She has had this in the past. We did treat with short course steroids twice in the fall and she was asked to follow up but did not due to back issues. She ended up having back surgery in December. She is recovering well but this caused flare. Rx 2-3 week taper of prednisone and refer to rheumatology as she will need steroid sparing treatment. She has had severely elevated ESR and high sensitivity CRP in the past with flares. Treated with extended course of prednisone (almost a year per patient) in the past.

## 2021-11-05 NOTE — Assessment & Plan Note (Signed)
Rx cortisporin for the ears. She does have CVID making sinus infections more common as well she has been on prednisone recently which could have suppressed her immune system.

## 2021-11-05 NOTE — Assessment & Plan Note (Signed)
Counseled that extended prednisone courses could cause progression to diabetes and that we should go with steroid sparing treatment for her PMR.

## 2021-11-07 ENCOUNTER — Other Ambulatory Visit (HOSPITAL_COMMUNITY): Payer: Self-pay

## 2021-11-11 ENCOUNTER — Other Ambulatory Visit (HOSPITAL_COMMUNITY): Payer: Self-pay

## 2021-11-11 MED ORDER — ZOLPIDEM TARTRATE 5 MG PO TABS
5.0000 mg | ORAL_TABLET | Freq: Every evening | ORAL | 3 refills | Status: DC | PRN
  Filled 2021-11-11: qty 30, 30d supply, fill #0
  Filled 2021-12-11: qty 30, 30d supply, fill #1
  Filled 2022-01-01: qty 30, 30d supply, fill #2
  Filled 2022-02-07: qty 30, 30d supply, fill #3

## 2021-11-17 DIAGNOSIS — D801 Nonfamilial hypogammaglobulinemia: Secondary | ICD-10-CM | POA: Diagnosis not present

## 2021-11-20 DIAGNOSIS — R5383 Other fatigue: Secondary | ICD-10-CM | POA: Diagnosis not present

## 2021-11-20 DIAGNOSIS — I10 Essential (primary) hypertension: Secondary | ICD-10-CM | POA: Diagnosis not present

## 2021-11-20 DIAGNOSIS — I480 Paroxysmal atrial fibrillation: Secondary | ICD-10-CM | POA: Diagnosis not present

## 2021-11-21 ENCOUNTER — Other Ambulatory Visit (HOSPITAL_COMMUNITY): Payer: Self-pay | Admitting: General Surgery

## 2021-11-21 ENCOUNTER — Other Ambulatory Visit: Payer: Self-pay | Admitting: General Surgery

## 2021-11-25 ENCOUNTER — Encounter: Payer: Self-pay | Admitting: Skilled Nursing Facility1

## 2021-11-25 ENCOUNTER — Encounter: Payer: 59 | Attending: General Surgery | Admitting: Skilled Nursing Facility1

## 2021-11-25 ENCOUNTER — Other Ambulatory Visit: Payer: Self-pay

## 2021-11-25 DIAGNOSIS — Z713 Dietary counseling and surveillance: Secondary | ICD-10-CM | POA: Insufficient documentation

## 2021-11-25 DIAGNOSIS — R5383 Other fatigue: Secondary | ICD-10-CM | POA: Diagnosis not present

## 2021-11-25 DIAGNOSIS — Z6841 Body Mass Index (BMI) 40.0 and over, adult: Secondary | ICD-10-CM | POA: Insufficient documentation

## 2021-11-25 DIAGNOSIS — I10 Essential (primary) hypertension: Secondary | ICD-10-CM | POA: Diagnosis not present

## 2021-11-25 DIAGNOSIS — I480 Paroxysmal atrial fibrillation: Secondary | ICD-10-CM | POA: Insufficient documentation

## 2021-11-25 NOTE — Progress Notes (Signed)
Nutrition Assessment for Bariatric Surgery Medical Nutrition Therapy Appt Start Time: 10:30    End Time: 11:30  Patient was seen on 11/25/2021 for Pre-Operative Nutrition Assessment. Letter of approval faxed to [facility] bariatric surgery program coordinator on 11/25/2021.   Referral stated Supervised Weight Loss (SWL) visits needed: 0  Pt completed visits.   Pt has cleared nutrition requirements.    Planned surgery: Sleeve Gastrectomy  Pt expectation of surgery: to lose weight  Pt expectation of dietitian: to help    NUTRITION ASSESSMENT   Anthropometrics  Start weight at [facility]: 324.9 lbs (date: 11/25/2021)  Height: 64.5 in BMI: 54.91 kg/m2     Clinical  Medical hx: GDM, cancer: non hodgkin lymphoma, arthritis, hyperlipidemia, hypertension, A-fib, diverticulosis, POTS, PMR Medications: RDW 17, albumin 3.2 Labs: A1C 5.7 Notable signs/symptoms: Reflux Any previous deficiencies? No  Micronutrient Nutrition Focused Physical Exam: Hair: No issues observed Eyes: No issues observed Mouth: No issues observed Neck: No issues observed Nails: No issues observed Skin: No issues observed  Lifestyle & Dietary Hx  Pt states since her back surgery her POTS has been acting up more often.    Pt has had her splenectomy, hip replacement, appendectomy.  Pt states she does have acid reflux taking pepcid daily 2 times a day: pt is aware of the GERD risk after the sleeve gastrectomy due to reading research on her own (pt has a doctorate in nursing)    Pt states her legs are weak so it keeps her from being active but states she will start doing her PT exercises she has previously learned.   Pt states she has custody of her grandchild.   Pt states she has been trying to listen to her body and relearn hunger cues. Pt states she is trying to eat every 2-3 hours. Pt states she is working on not finishing her plate.   24-Hr Dietary Recall First Meal: granola bar Snack 10-11am:  rice and granola bar Second Meal 1pm: salad Snack 2pm: rice Third Meal: fish and tator tots and veggie straws  Snack: granola bar Beverages: water + flavoring, diet soda   Estimated Energy Needs Calories: 1500   NUTRITION DIAGNOSIS  Overweight/obesity ([place]-3.3) related to past poor dietary habits and physical inactivity as evidenced by patient w/ planned sleeve gastrectomy surgery following dietary guidelines for continued weight loss.    NUTRITION INTERVENTION  Nutrition counseling (C-1) and education (E-2) to facilitate bariatric surgery goals.  Educated pt on micronutrient deficiencies post surgery and strategies to mitigate that risk   Pre-Op Goals Reviewed with the Patient Track food and beverage intake (pen and paper, MyFitness Pal, Baritastic app, etc.) Make healthy food choices while monitoring portion sizes Consume 3 meals per day or try to eat every 3-5 hours Avoid concentrated sugars and fried foods Keep sugar & fat in the single digits per serving on food labels Practice CHEWING your food (aim for applesauce consistency) Practice not drinking 15 minutes before, during, and 30 minutes after each meal and snack Avoid all carbonated beverages (ex: soda, sparkling beverages)  Limit caffeinated beverages (ex: coffee, tea, energy drinks) Avoid all sugar-sweetened beverages (ex: regular soda, sports drinks)  Avoid alcohol  Aim for 64-100 ounces of FLUID daily (with at least half of fluid intake being plain water)  Aim for at least 60-80 grams of PROTEIN daily Look for a liquid protein source that contains ?15 g protein and ?5 g carbohydrate (ex: shakes, drinks, shots) Make a list of non-food related activities Physical  activity is an important part of a healthy lifestyle so keep it moving! The goal is to reach 150 minutes of exercise per week, including cardiovascular and weight baring activity.  *Goals that are bolded indicate the pt would like to start working towards  these  Handouts Provided Include  Bariatric Surgery handouts (Nutrition Visits, Pre-Op Goals, Protein Shakes, Vitamins & Minerals) Mindful meals  Learning Style & Readiness for Change Teaching method utilized: Visual & Auditory  Demonstrated degree of understanding via: Teach Back  Readiness Level: action Barriers to learning/adherence to lifestyle change: none identified      MONITORING & EVALUATION Dietary intake, weekly physical activity, body weight, and pre-op goals reached at next nutrition visit.    Next Steps  Patient is to follow up at [facility] for Pre-Op Class >2 weeks before surgery for further nutrition education.  Pt has completed visits. No further supervised visits required/recomended

## 2021-11-27 ENCOUNTER — Ambulatory Visit (INDEPENDENT_AMBULATORY_CARE_PROVIDER_SITE_OTHER): Payer: 59 | Admitting: Orthopaedic Surgery

## 2021-11-27 ENCOUNTER — Encounter: Payer: Self-pay | Admitting: Orthopaedic Surgery

## 2021-11-27 ENCOUNTER — Encounter: Payer: Self-pay | Admitting: Internal Medicine

## 2021-11-27 ENCOUNTER — Other Ambulatory Visit: Payer: Self-pay

## 2021-11-27 VITALS — BP 151/86 | HR 55 | Ht 64.5 in | Wt 320.0 lb

## 2021-11-27 DIAGNOSIS — Z9889 Other specified postprocedural states: Secondary | ICD-10-CM

## 2021-11-27 NOTE — Progress Notes (Signed)
? ?Post-Op Visit Note ?  ?Patient: Monique Clark           ?Date of Birth: 1966-09-26           ?MRN: [id number] ?Visit Date: 11/27/2021 ?PCP: Hoyt Koch, MD ? ? ?Assessment & Plan: Follow-up right L4-5 microdiscectomy 09/18/2021.  Patient had some prednisone for flare of PMR with arms shoulders hands etc.  States backs doing better but her principal problem is from the period of inactivity both legs feel little bit weak.  Pain down the right leg is improved but she states after she walks for a while her legs get tired and she has to rest and then can continue ambulating.  The sciatic pain she had preop she states is gone.  She states she has bariatric surgery coming up soon and she is looking forward to this and is hoping that weight loss also helps make her conditioning progress easier. ? ?Chief Complaint:  ?Chief Complaint  ?Patient presents with  ? Lower Back - Follow-up  ?  09/18/2021 Right L4-5 microdiscectomy  ? ?Visit Diagnoses:  ?1. S/P lumbar microdiscectomy   ? ? ?Plan: After discectomy good relief of sciatica.  She can follow-up with me on an as-needed basis. ? ?Follow-Up Instructions: No follow-ups on file.  ? ?Orders:  ?No orders of the defined types were placed in this encounter. ? ?No orders of the defined types were placed in this encounter. ? ? ?Imaging: ?No results found. ? ?PMFS History: ?Patient Active Problem List  ? Diagnosis Date Noted  ? Otitis externa 11/05/2021  ? PMR (polymyalgia rheumatica) (HCC) 11/05/2021  ? S/P lumbar microdiscectomy 09/27/2021  ? Insomnia 05/28/2021  ? Elevated serum hCG 07/02/2020  ? Paroxysmal atrial fibrillation (HCC)   ? Localized swelling of left lower extremity   ? Lightheadedness   ? Essential hypertension   ? History of bilateral total hip arthroplasty 07/29/2018  ? Morbid obesity with body mass index (BMI) of 50.0 to 59.9 in adult [facility]) 12/28/2015  ? CVID (common variable immunodeficiency) ([place]) 05/07/2015  ? Post-nasal discharge 03/30/2013  ?  Prediabetes 01/20/2013  ? S/P splenectomy 01/20/2013  ? History of non-Hodgkin's lymphoma 12/21/2012  ? Low back pain radiating to right lower extremity 12/10/2012  ? Right lumbar radiculitis 12/10/2012  ? ?Past Medical History:  ?Diagnosis Date  ? Abscess   ? Arthritis   ? Atrial fibrillation ([place])   ? Cardiac arrhythmia due to congenital heart disease   ? trace MR 08/15/20 echo ([place])  ? CVID (common variable immunodeficiency) ([place]) 2015  ? Diabetes mellitus without complication ([place])   ? Diverticulosis   ? Elevated cholesterol   ? GERD (gastroesophageal reflux disease)   ? High blood pressure   ? Non Hodgkin's lymphoma ([place]) 1998  ? Obesity   ? PMR (polymyalgia rheumatica) (HCC)   ? Pneumonia   ? PONV (postoperative nausea and vomiting)   ? POTS (postural orthostatic tachycardia syndrome)   ? Pre-diabetes   ? UTI (urinary tract infection)   ?  ?Family History  ?Problem Relation Age of Onset  ? Atrial fibrillation Mother   ? Cancer Father   ?     Colon  ? Atrial fibrillation Maternal Uncle   ? Atrial fibrillation Maternal Grandmother   ? Atrial fibrillation Maternal Grandfather   ? Heart attack Maternal Grandfather   ? Ovarian cancer Neg Hx   ?  ?Past Surgical History:  ?Procedure Laterality Date  ? ABDOMINAL HYSTERECTOMY    ? APPENDECTOMY    ?  CESAREAN SECTION    ? GALLBLADDER SURGERY    ? JOINT REPLACEMENT Left   ? hip  ? JOINT REPLACEMENT Right   ? Right Hip 2022  ? left shoulder repair    ? LUMBAR LAMINECTOMY N/A 09/18/2021  ? Procedure: RIGHT LUMBAR FOUR-FIVE MICRODISCECTOMY;  Surgeon: Marybelle Killings, MD;  Location: [place];  Service: Orthopedics;  Laterality: N/A;  ? LUNG REMOVAL, PARTIAL    ? myringostomy Bilateral   ? SPLENECTOMY, TOTAL  1998  ? TONSILLECTOMY AND ADENOIDECTOMY    ? ?Social History  ? ?Occupational History  ? Occupation: rn  ?Tobacco Use  ? Smoking status: Former  ?  Packs/day: 0.50  ?  Types: Cigarettes  ?  Quit date: 09/29/2002  ?  Years since quitting: 19.1  ? Smokeless tobacco:  Never  ?Vaping Use  ? Vaping Use: Never used  ?Substance and Sexual Activity  ? Alcohol use: No  ? Drug use: No  ? Sexual activity: Yes  ?  Birth control/protection: Surgical, Post-menopausal  ?  Comment: 1st intercourse 78 yo-5 partners  ? ? ? ?

## 2021-11-28 ENCOUNTER — Encounter: Payer: Self-pay | Admitting: Internal Medicine

## 2021-11-29 ENCOUNTER — Ambulatory Visit (HOSPITAL_COMMUNITY)
Admission: RE | Admit: 2021-11-29 | Discharge: 2021-11-29 | Disposition: A | Discharge status: Home / Self Care | Payer: 59 | Source: Ambulatory Visit | Attending: General Surgery | Admitting: General Surgery

## 2021-11-29 ENCOUNTER — Other Ambulatory Visit: Payer: Self-pay

## 2021-11-29 DIAGNOSIS — K219 Gastro-esophageal reflux disease without esophagitis: Secondary | ICD-10-CM | POA: Diagnosis not present

## 2021-11-29 DIAGNOSIS — M47814 Spondylosis without myelopathy or radiculopathy, thoracic region: Secondary | ICD-10-CM | POA: Diagnosis not present

## 2021-11-30 ENCOUNTER — Other Ambulatory Visit: Payer: Self-pay | Admitting: Internal Medicine

## 2021-11-30 ENCOUNTER — Other Ambulatory Visit: Payer: Self-pay

## 2021-11-30 ENCOUNTER — Other Ambulatory Visit (HOSPITAL_COMMUNITY): Payer: Self-pay

## 2021-12-01 NOTE — Progress Notes (Signed)
Office Visit Note  Patient: Monique Clark             Date of Birth: [birth year]           MRN: [id number]             PCP: Hoyt Koch, MD Referring: Hoyt Koch, * Visit Date: 12/02/2021  Subjective:  New Patient (Initial Visit) (Abnormal labs, slight tremor in bil hands)   History of Present Illness: Monique Clark is a 56 y.o. female here for muscle pain and weakness in shoulders and legs concern for history of PMR previously treated with long term prednisone.  She has a history of CVID on IVIG, non hodgkins lymphoma, also has history of bilateral hip replacement. Recently had microdiscectomy about 6 weeks prior. Started prednisone 40 mg daily last month decreased to 20 mg daily and now is down to 10 mg dose. She felt her symptoms improve about 80-90% when starting prednisone again and tapering has been okay, only slight increase with this dose reduction. She does not see any particular swelling, discoloration, or rashes. She has noticed some hand tremor more when using her hands that is not typical for her.  Activities of Daily Living:  Patient reports morning stiffness for 24 hours.   Patient Reports nocturnal pain.  Difficulty dressing/grooming: Reports Difficulty climbing stairs: Reports Difficulty getting out of chair: Reports Difficulty using hands for taps, buttons, cutlery, and/or writing: Reports  Review of Systems  Constitutional:  Positive for fatigue.  HENT:  Positive for mouth dryness.   Eyes:  Positive for dryness.  Respiratory:  Negative for shortness of breath.   Cardiovascular:  Positive for swelling in legs/feet.  Gastrointestinal:  Negative for constipation.  Endocrine: Positive for cold intolerance and heat intolerance.  Genitourinary:  Negative for difficulty urinating.  Musculoskeletal:  Positive for joint pain, joint pain, joint swelling, muscle weakness, morning stiffness and muscle tenderness.  Skin:  Negative for rash.   Allergic/Immunologic: Positive for susceptible to infections.  Neurological:  Positive for weakness.  Hematological:  Positive for bruising/bleeding tendency.  Psychiatric/Behavioral:  Positive for sleep disturbance.    PMFS History:  Patient Active Problem List   Diagnosis Date Noted   High risk medication use 12/02/2021   Otitis externa 11/05/2021   PMR (polymyalgia rheumatica) ([place]) 11/05/2021   S/P lumbar microdiscectomy 09/27/2021   Insomnia 05/28/2021   Malignant (primary) neoplasm, unspecified ([place]) 03/08/2021   Personal history of urinary (tract) infections 03/08/2021   Personal history of pneumonia (recurrent) 03/08/2021   Elevated serum hCG 07/02/2020   POTS (postural orthostatic tachycardia syndrome) 03/08/2020   Intermittent claudication ([place]) 01/04/2020   Mixed hyperlipidemia 03/21/2019   Palpitations 03/21/2019   Paroxysmal atrial fibrillation (HCC)    Localized swelling of left lower extremity    Lightheadedness    Essential hypertension    History of bilateral total hip arthroplasty 07/29/2018   Cellulitis 06/22/2017   Mitral valve regurgitation 06/22/2017   S/P TAH (total abdominal hysterectomy) 03/14/2016   Morbid obesity with body mass index (BMI) of 50.0 to 59.9 in adult [facility]) 12/28/2015   CVID (common variable immunodeficiency) ([place]) 05/07/2015   Post-nasal discharge 03/30/2013   Prediabetes 01/20/2013   S/P splenectomy 01/20/2013   History of non-Hodgkin's lymphoma 12/21/2012   Low back pain radiating to right lower extremity 12/10/2012   Right lumbar radiculitis 12/10/2012   S/P chole 07/13/2012   Cellulitis of breast 01/12/2011   History of appendectomy 07/12/1984   Mononucleosis 12/08/1983  Past Medical History:  Diagnosis Date   Abscess    Arthritis    Atrial fibrillation ([place])    Cardiac arrhythmia due to congenital heart disease    trace MR 08/15/20 echo ([place])   CVID (common variable immunodeficiency) ([place]) 2015    Diverticulosis    Elevated cholesterol    GERD (gastroesophageal reflux disease)    High blood pressure    Non Hodgkin's lymphoma ([place]) 1998   Obesity    Personal history of gestational diabetes    PMR (polymyalgia rheumatica) (HCC)    Pneumonia    PONV (postoperative nausea and vomiting)    POTS (postural orthostatic tachycardia syndrome)    Pre-diabetes    UTI (urinary tract infection)     Family History  Problem Relation Age of Onset   Atrial fibrillation Mother    Cancer Father        Colon   Colon cancer Sister    Basal cell carcinoma Brother    Atrial fibrillation Maternal Uncle    Atrial fibrillation Maternal Grandmother    Atrial fibrillation Maternal Grandfather    Heart attack Maternal Grandfather    Ovarian cancer Neg Hx    Past Surgical History:  Procedure Laterality Date   ABDOMINAL HYSTERECTOMY     APPENDECTOMY     CESAREAN SECTION     GALLBLADDER SURGERY     JOINT REPLACEMENT Left    hip   JOINT REPLACEMENT Right    Right Hip 2022   left shoulder repair     LUMBAR LAMINECTOMY N/A 09/18/2021   Procedure: RIGHT LUMBAR FOUR-FIVE MICRODISCECTOMY;  Surgeon: Marybelle Killings, MD;  Location: [place];  Service: Orthopedics;  Laterality: N/A;   LUNG REMOVAL, PARTIAL     myringostomy Bilateral    SPLENECTOMY, TOTAL  1998   TONSILLECTOMY AND ADENOIDECTOMY     Social History   Social History Narrative   Not on file   Immunization History  Administered Date(s) Administered   Influenza-Unspecified 07/27/2020   PFIZER(Purple Top)SARS-COV-2 Vaccination 09/24/2019, 10/12/2019     Objective: Vital Signs: BP 134/83 (BP Location: Other (Comment), Patient Position: Sitting, Cuff Size: Normal) Comment (BP Location): fore arm   Pulse 60    Resp 18    Ht '5\' 5"'  (1.651 m)    Wt (!) 324 lb (147 kg)    BMI 53.92 kg/m    Physical Exam Constitutional:      Appearance: She is obese.  HENT:     Mouth/Throat:     Mouth: Mucous membranes are moist.     Pharynx: Oropharynx is  clear.  Eyes:     Conjunctiva/sclera: Conjunctivae normal.  Cardiovascular:     Rate and Rhythm: Normal rate and regular rhythm.  Pulmonary:     Effort: Pulmonary effort is normal.     Breath sounds: Normal breath sounds.  Musculoskeletal:     Right lower leg: No edema.     Left lower leg: No edema.  Skin:    General: Skin is warm and dry.     Findings: No rash.  Neurological:     Mental Status: She is alert.  Psychiatric:        Mood and Affect: Mood normal.     Musculoskeletal Exam:  Shoulders full ROM no tenderness or swelling Elbows full ROM no tenderness or swelling Wrists full ROM no tenderness or swelling Fingers full ROM no tenderness or swelling Knees full ROM no tenderness or swelling   Investigation: No additional findings.  Imaging:  DG Chest 2 View  Result Date: 11/30/2021 CLINICAL DATA:  Morbid obesity ([place]) EXAM: CHEST - 2 VIEW COMPARISON:  April 22, 2021 FINDINGS: The cardiomediastinal silhouette is unchanged in contour. No pleural effusion. No pneumothorax. No acute pleuroparenchymal abnormality. Visualized abdomen is unremarkable. Multilevel degenerative changes of the thoracic spine. IMPRESSION: No acute cardiopulmonary abnormality. Electronically Signed   By: Valentino Saxon M.D.   On: 11/30/2021 19:39   DG UGI W DOUBLE CM (HD BA)  Result Date: 11/29/2021 CLINICAL DATA:  Patient with history of morbid obesity and GERD. Patient presents for single UGI with tablet for pre- surgical evaluation. EXAM: UPPER GI SERIES WITH HIGH DENSITY WITHOUT KUB TECHNIQUE: Single contrast examination was then performed using thin liquid barium. This exam was performed by Rushie Nyhan NP, and was supervised and interpreted by Dr. Claudie Revering. FLUOROSCOPY: Radiation Exposure Index: 31.7 mGy [place] COMPARISON:  NONE. FINDINGS: Esophagus: Normal appearance. Morphology without evidence of esophagitis or ulceration. No esophageal stricture, diverticula, or mass lesion. Esophageal  motility:  Within normal limits. Gastroesophageal reflux: There is no spontaneous or inducible gastroesophageal reflux Ingested 36m barium tablet: Passed normally Stomach: Examination of the stomach demonstrated normal rugal folds and areae gastricae. The gastric mucosa appeared unremarkable without evidence of ulceration, scarring, or mass lesion. No evidence of hiatal hernia. Gastric emptying: Normal. Duodenum: Normal motility and morphology without evidence of ulceration or mass lesion. Other:  None. IMPRESSION: Unremarkable UGI single with tablet. Electronically Signed   By: SClaudie ReveringM.D.   On: 11/29/2021 12:23    Recent Labs: Lab Results  Component Value Date   WBC 14.7 (H) 12/02/2021   HGB 12.7 12/02/2021   PLT 439 (H) 12/02/2021   NA 142 12/02/2021   K 4.5 12/02/2021   CL 102 12/02/2021   CO2 30 12/02/2021   GLUCOSE 77 12/02/2021   BUN 17 12/02/2021   CREATININE 0.76 12/02/2021   BILITOT 0.4 12/02/2021   ALKPHOS 70 09/16/2021   AST 21 12/02/2021   ALT 20 12/02/2021   PROT 6.6 12/02/2021   ALBUMIN 3.2 (L) 09/16/2021   CALCIUM 9.9 12/02/2021   GFRAA >60 03/18/2020    Speciality Comments: No specialty comments available.  Procedures:  No procedures performed Allergies: Nizatidine, Promethazine, and Tetanus toxoids   Assessment / Plan:     Visit Diagnoses: PMR (polymyalgia rheumatica) (HCC) - Plan: Sedimentation rate, C-reactive protein, CBC with Differential/Platelet, COMPLETE METABOLIC PANEL WITH GFR  Symptoms and steroid responsiveness appear very consistent with PMR. Possible exacerbation provoked by recent surgery otherwise not sure why. Symptoms look good on the prednisone 10 mg. Checking ESR and CRP today I would like to see near normal before proceeding on taper schedule. Discussed methotrexate as a steroid sparing DMARD to improve chance for successful or quicker steroid reduction.  CVID (common variable immunodeficiency) ([place]  Infection risk increased with  long term immunosuppression, stable immune globulin injection regimen.  History of non-Hodgkin's lymphoma  Not active problem, methotrexate not a particular change in risk due history of lymphoma.  High risk medication use - Plan: Sedimentation rate, C-reactive protein, CBC with Differential/Platelet, COMPLETE METABOLIC PANEL WITH GFR  Checking CBC and CMP baseline labs for methotrexate toxicity monitoring. Previous hepatitis screening was reviewed and is negative.  Orders: Orders Placed This Encounter  Procedures   Sedimentation rate   C-reactive protein   CBC with Differential/Platelet   COMPLETE METABOLIC PANEL WITH GFR   No orders of the defined types were placed in this encounter.    Follow-Up  Instructions: No follow-ups on file.   Collier Salina, MD  Note - This record has been created using [employer].  Chart creation errors have been sought, but may not always  have been located. Such creation errors do not reflect on  the standard of medical care.

## 2021-12-02 ENCOUNTER — Ambulatory Visit: Payer: 59 | Admitting: Internal Medicine

## 2021-12-02 ENCOUNTER — Other Ambulatory Visit: Payer: Self-pay | Admitting: Internal Medicine

## 2021-12-02 ENCOUNTER — Other Ambulatory Visit: Payer: Self-pay

## 2021-12-02 ENCOUNTER — Other Ambulatory Visit (HOSPITAL_COMMUNITY): Payer: Self-pay

## 2021-12-02 ENCOUNTER — Encounter: Payer: Self-pay | Admitting: Internal Medicine

## 2021-12-02 VITALS — BP 134/83 | HR 60 | Resp 18 | Ht 65.0 in | Wt 324.0 lb

## 2021-12-02 DIAGNOSIS — Z79899 Other long term (current) drug therapy: Secondary | ICD-10-CM | POA: Diagnosis not present

## 2021-12-02 DIAGNOSIS — Z8572 Personal history of non-Hodgkin lymphomas: Secondary | ICD-10-CM | POA: Diagnosis not present

## 2021-12-02 DIAGNOSIS — D839 Common variable immunodeficiency, unspecified: Secondary | ICD-10-CM

## 2021-12-02 DIAGNOSIS — M353 Polymyalgia rheumatica: Secondary | ICD-10-CM

## 2021-12-02 MED ORDER — VITAMIN D3 1.25 MG (50000 UT) PO CAPS
ORAL_CAPSULE | ORAL | 5 refills | Status: DC
  Filled 2021-12-02: qty 24, 84d supply, fill #0
  Filled 2022-03-07: qty 24, 84d supply, fill #1

## 2021-12-02 MED ORDER — XARELTO 20 MG PO TABS
ORAL_TABLET | ORAL | 4 refills | Status: DC
  Filled 2021-12-02: qty 90, 90d supply, fill #0

## 2021-12-03 LAB — COMPLETE METABOLIC PANEL WITH GFR
AG Ratio: 1.4 (calc) (ref 1.0–2.5)
ALT: 20 U/L (ref 6–29)
AST: 21 U/L (ref 10–35)
Albumin: 3.9 g/dL (ref 3.6–5.1)
Alkaline phosphatase (APISO): 86 U/L (ref 37–153)
BUN: 17 mg/dL (ref 7–25)
CO2: 30 mmol/L (ref 20–32)
Calcium: 9.9 mg/dL (ref 8.6–10.4)
Chloride: 102 mmol/L (ref 98–110)
Creat: 0.76 mg/dL (ref 0.50–1.03)
Globulin: 2.7 g/dL (calc) (ref 1.9–3.7)
Glucose, Bld: 77 mg/dL (ref 65–99)
Potassium: 4.5 mmol/L (ref 3.5–5.3)
Sodium: 142 mmol/L (ref 135–146)
Total Bilirubin: 0.4 mg/dL (ref 0.2–1.2)
Total Protein: 6.6 g/dL (ref 6.1–8.1)
eGFR: 92 mL/min/{1.73_m2} (ref >=60)

## 2021-12-03 LAB — CBC WITH DIFFERENTIAL/PLATELET
Absolute Monocytes: 1352 cells/uL — ABNORMAL HIGH (ref 200–950)
Basophils Absolute: 44 cells/uL (ref 0–200)
Basophils Relative: 0.3 %
Eosinophils Absolute: 191 cells/uL (ref 15–500)
Eosinophils Relative: 1.3 %
HCT: 40.1 % (ref 35.0–45.0)
Hemoglobin: 12.7 g/dL (ref 11.7–15.5)
Lymphs Abs: 5571 cells/uL — ABNORMAL HIGH (ref 850–3900)
MCH: 27.4 pg (ref 27.0–33.0)
MCHC: 31.7 g/dL — ABNORMAL LOW (ref 32.0–36.0)
MCV: 86.6 fL (ref 80.0–100.0)
MPV: 12 fL (ref 7.5–12.5)
Monocytes Relative: 9.2 %
Neutro Abs: 7541 cells/uL (ref 1500–7800)
Neutrophils Relative %: 51.3 %
Platelets: 439 10*3/uL — ABNORMAL HIGH (ref 140–400)
RBC: 4.63 10*6/uL (ref 3.80–5.10)
RDW: 14.5 % (ref 11.0–15.0)
Total Lymphocyte: 37.9 %
WBC: 14.7 10*3/uL — ABNORMAL HIGH (ref 3.8–10.8)

## 2021-12-03 LAB — C-REACTIVE PROTEIN: CRP: 40.4 mg/L — ABNORMAL HIGH (ref <8.0)

## 2021-12-03 LAB — SEDIMENTATION RATE: Sed Rate: 43 mm/h — ABNORMAL HIGH (ref 0–30)

## 2021-12-05 ENCOUNTER — Encounter: Payer: Self-pay | Admitting: Internal Medicine

## 2021-12-05 ENCOUNTER — Other Ambulatory Visit (HOSPITAL_COMMUNITY): Payer: Self-pay

## 2021-12-08 MED ORDER — FOLIC ACID 1 MG PO TABS
1.0000 mg | ORAL_TABLET | Freq: Every day | ORAL | 0 refills | Status: DC
  Filled 2021-12-08: qty 90, 90d supply, fill #0

## 2021-12-08 MED ORDER — METHOTREXATE 2.5 MG PO TABS
15.0000 mg | ORAL_TABLET | ORAL | 1 refills | Status: DC
  Filled 2021-12-08: qty 30, 35d supply, fill #0
  Filled 2022-01-01: qty 30, 35d supply, fill #1

## 2021-12-09 ENCOUNTER — Other Ambulatory Visit (HOSPITAL_COMMUNITY): Payer: Self-pay

## 2021-12-11 ENCOUNTER — Other Ambulatory Visit (HOSPITAL_COMMUNITY): Payer: Self-pay

## 2021-12-12 ENCOUNTER — Other Ambulatory Visit (HOSPITAL_COMMUNITY): Payer: Self-pay

## 2021-12-15 DIAGNOSIS — D801 Nonfamilial hypogammaglobulinemia: Secondary | ICD-10-CM | POA: Diagnosis not present

## 2021-12-18 ENCOUNTER — Other Ambulatory Visit: Payer: Self-pay

## 2021-12-18 ENCOUNTER — Ambulatory Visit: Payer: 59 | Attending: General Surgery

## 2021-12-18 DIAGNOSIS — I10 Essential (primary) hypertension: Secondary | ICD-10-CM | POA: Diagnosis not present

## 2021-12-18 DIAGNOSIS — R5383 Other fatigue: Secondary | ICD-10-CM | POA: Diagnosis not present

## 2021-12-18 DIAGNOSIS — M6281 Muscle weakness (generalized): Secondary | ICD-10-CM | POA: Diagnosis not present

## 2021-12-18 DIAGNOSIS — I480 Paroxysmal atrial fibrillation: Secondary | ICD-10-CM | POA: Diagnosis not present

## 2021-12-18 NOTE — Therapy (Signed)
[facility] ?[place] ?[place], [place], [address] ?Phone: [phone]   Fax:  [phone] ? ?Physical Therapy Evaluation ? ?Patient Details  ?Name: Monique Clark ?MRN: [id number] ?Date of Birth: 07/03/1966 ?Referring Provider (PT): Kinsinger, MD ? ? ?Encounter Date: 12/18/2021 ? ? PT End of Session - 12/18/21 1038   ? ? Visit Number 1   ? Number of Visits 6   ? Date for PT Re-Evaluation 02/28/22   ? PT Start Time 0630   ? PT Stop Time 1110   ? PT Time Calculation (min) 31 min   ? Activity Tolerance Patient tolerated treatment well   ? Behavior During Therapy [facility] for tasks assessed/performed   ? ?  ?  ? ?  ? ? ?Past Medical History:  ?Diagnosis Date  ? Abscess   ? Arthritis   ? Atrial fibrillation ([place])   ? Cardiac arrhythmia due to congenital heart disease   ? trace MR 08/15/20 echo ([place])  ? CVID (common variable immunodeficiency) ([place]) 2015  ? Diverticulosis   ? Elevated cholesterol   ? GERD (gastroesophageal reflux disease)   ? High blood pressure   ? Non Hodgkin's lymphoma ([place]) 1998  ? Obesity   ? Personal history of gestational diabetes   ? PMR (polymyalgia rheumatica) (HCC)   ? Pneumonia   ? PONV (postoperative nausea and vomiting)   ? POTS (postural orthostatic tachycardia syndrome)   ? Pre-diabetes   ? UTI (urinary tract infection)   ? ? ?Past Surgical History:  ?Procedure Laterality Date  ? ABDOMINAL HYSTERECTOMY    ? APPENDECTOMY    ? CESAREAN SECTION    ? GALLBLADDER SURGERY    ? JOINT REPLACEMENT Left   ? hip  ? JOINT REPLACEMENT Right   ? Right Hip 2022  ? left shoulder repair    ? LUMBAR LAMINECTOMY N/A 09/18/2021  ? Procedure: RIGHT LUMBAR FOUR-FIVE MICRODISCECTOMY;  Surgeon: Marybelle Killings, MD;  Location: [facility] [place];  Service: Orthopedics;  Laterality: N/A;  ? LUNG REMOVAL, PARTIAL    ? myringostomy Bilateral   ? SPLENECTOMY, TOTAL  1998  ? TONSILLECTOMY AND ADENOIDECTOMY    ? ? ?There were no vitals filed for this visit. ? ? ? Subjective  Assessment - 12/18/21 1038   ? ? Subjective Patient reports that she feels that her legs have been feeling weak since she had lumbar surgery. She notes that her legs have felt better since she began a new medication about 2 weeks ago. 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 She tries to avoid stairs if possible.   ? Pertinent History THA in January 2022, lumbar surgery in 08/2021, bilateral THA   ? Limitations Walking   ? How long can you stand comfortably? 1 minute prior to begin to sway and then have to sit down   ? How long can you walk comfortably? 50 feet prior to having to rest due to fatigue   ? Patient Stated Goals walk longer, improved lower extremity strength, stand longer   ? Currently in Pain? No/denies   ? ?  ?  ? ?  ? ? ? ? ? [facility] PT Assessment - 12/18/21 0001   ? ?  ? Assessment  ? Medical Diagnosis Morbid obesity due to excess calorie intake   ? Referring Provider (PT) Kinsinger, MD   ? Next MD Visit none scheduled   ? Prior Therapy Yes, for her hip replacements   ?  ?  Precautions  ? Precautions None   ?  ? Restrictions  ? Weight Bearing Restrictions No   ?  ? Balance Screen  ? Has the patient fallen in the past 6 months No   ? Has the patient had a decrease in activity level because of a fear of falling?  No   ? Is the patient reluctant to leave their home because of a fear of falling?  No   ?  ? Home Environment  ? Living Environment Private residence   ? Home Access Stairs to enter   ? Entrance Stairs-Number of Steps 2   ? Entrance Stairs-Rails Right   ? Home Layout Two level   ? Alternate Level Stairs-Number of Steps --   does not go up stairs  ?  ? Prior Function  ? Level of Independence Independent   ? Vocation Full time employment   ? Vocation Requirements 10 hour shft: 50% on her feet   ? Leisure sing, playing with her kids, swimming   ?  ? Cognition  ? Overall Cognitive Status Within Functional Limits for tasks assessed   ?  Attention Focused   ? Focused Attention Appears intact   ? Memory Appears intact   ? Awareness Appears intact   ? Problem Solving Appears intact   ?  ? Sensation  ? Additional Comments Patient reports no numbness or tingling   ?  ? ROM / Strength  ? AROM / PROM / Strength Strength;AROM   ?  ? AROM  ? Overall AROM  Within functional limits for tasks performed   ?  ? Strength  ? Strength Assessment Site Hip;Knee;Ankle   ? Right/Left Hip Left;Right   ? Right Hip Flexion 4-/5   ? Left Hip Flexion 4-/5   ? Right/Left Knee Right;Left   ? Right Knee Flexion 4/5   ? Right Knee Extension 4/5   ? Left Knee Flexion 4-/5   ? Left Knee Extension 4-/5   ? Right/Left Ankle Right;Left   ? Right Ankle Dorsiflexion 4/5   ? Left Ankle Dorsiflexion 4/5   ?  ? Transfers  ? Transfers Sit to Stand;Stand to Sit   ? Sit to Stand 6: Modified independent (Device/Increase time);Without upper extremity assist   ? Five time sit to stand comments  38 seconds   right knee pain  ? Stand to Sit 6: Modified independent (Device/Increase time);Without upper extremity assist   ?  ? Ambulation/Gait  ? Ambulation/Gait Yes   ? Ambulation/Gait Assistance 6: Modified independent (Device/Increase time)   ? Ambulation Distance (Feet) 140 Feet   rest break needed halfway  ? Assistive device None   ? Gait Pattern Within Functional Limits   ? Ambulation Surface Level;Indoor   ? Gait velocity 70 feet in 32.65 seconds   0.65 m/s  ? Gait Comments Required brief standing rest break after walking 70 feet   ? ?  ?  ? ?  ? ? ? ? ? ? ? ? ? ? ? ? ? ?Objective measurements completed on examination: See above findings.  ? ? ? ? ? ? ? ? ? ? ? ? ? ? PT Education - 12/18/21 1213   ? ? Education Details benefits of exercise, HEP, walking program   ? Person(s) Educated Patient   ? Methods Explanation   ? Comprehension Verbalized understanding   ? ?  ?  ? ?  ? ? ? ? ? ? PT Long Term Goals - 12/18/21 1208   ? ?  ?  PT LONG TERM GOAL #1  ? Title Patient will be independent with her  HEP.   ? Time 6   ? Period Weeks   ? Status New   ? Target Date 01/29/22   ?  ? PT LONG TERM GOAL #2  ? Title Patient will improve her gait speed to at least 0.8 m/s for improved functional mobility.   ? Time 6   ? Period Weeks   ? Status New   ? Target Date 01/29/22   ?  ? PT LONG TERM GOAL #3  ? Title Patient will be able to improve her five time sit to stand time to 28 seconds or less for improved lower extremity power.   ? Time 6   ? Period Weeks   ? Status New   ? Target Date 01/29/22   ?  ? PT LONG TERM GOAL #4  ? Title Patient will be able to walk at least 150 feet without having to stop and rest.   ? Time 6   ? Period Weeks   ? Status New   ? Target Date 01/29/22   ? ?  ?  ? ?  ? ? ? ? ? ? ? ? ? Plan - 12/18/21 1131   ? ? Clinical Impression Statement Patient is a 56 year old female presenting to physical for prehabilitation for a bariatric surgery. Fatigue is her primary limitation as she required a standing rest break after walking 70 feet. She also exhibited an increased five time sit to stand time which indicates reduced lower extremity power. She would benefit from continued physical therapy to address her remaining impairments to maximize her functional mobility prior to surgery.   ? Personal Factors and Comorbidities Fitness;Time since onset of injury/illness/exacerbation;Comorbidity 2   ? Comorbidities HTN, a-fib,   ? Examination-Activity Limitations Locomotion Level;Transfers;Stairs;Stand   ? Examination-Participation Restrictions Occupation;Community Activity;Shop;Cleaning   ? Stability/Clinical Decision Making Evolving/Moderate complexity   ? Clinical Decision Making Moderate   ? Rehab Potential Good   ? PT Frequency 1x / week   ? PT Duration 6 weeks   ? PT Treatment/Interventions ADLs/Self Care Home Management;Neuromuscular re-education;Therapeutic exercise;Therapeutic activities;Functional mobility training;Stair training;Patient/family education;Energy conservation   ? PT Next Visit Plan  Nustep, lower extremity strengthening and endurance interventions   ? PT Home Exercise Plan Progress her current HEP to 2x per day (LAQ, sit to stands, bridges, lower trunk rotations)   ? Consulted and Agree with Plan

## 2021-12-19 ENCOUNTER — Other Ambulatory Visit (HOSPITAL_COMMUNITY): Payer: Self-pay

## 2021-12-20 ENCOUNTER — Ambulatory Visit (HOSPITAL_COMMUNITY)
Admission: EM | Admit: 2021-12-20 | Discharge: 2021-12-20 | Disposition: A | Discharge status: Home / Self Care | Payer: 59 | Attending: Urgent Care | Admitting: Urgent Care

## 2021-12-20 ENCOUNTER — Encounter (HOSPITAL_COMMUNITY): Payer: Self-pay

## 2021-12-20 DIAGNOSIS — G8929 Other chronic pain: Secondary | ICD-10-CM | POA: Diagnosis not present

## 2021-12-20 DIAGNOSIS — M545 Low back pain, unspecified: Secondary | ICD-10-CM

## 2021-12-20 DIAGNOSIS — H6983 Other specified disorders of Eustachian tube, bilateral: Secondary | ICD-10-CM

## 2021-12-20 DIAGNOSIS — J4521 Mild intermittent asthma with (acute) exacerbation: Secondary | ICD-10-CM | POA: Diagnosis not present

## 2021-12-20 MED ORDER — METHOCARBAMOL 500 MG PO TABS: 500.0000 mg | ORAL_TABLET | Freq: Four times a day (QID) | ORAL | 0 refills | Status: DC | PRN

## 2021-12-20 MED ORDER — MONTELUKAST SODIUM 10 MG PO TABS: 10.0000 mg | ORAL_TABLET | Freq: Every day | ORAL | 0 refills | Status: DC

## 2021-12-20 MED ORDER — CETIRIZINE HCL 10 MG PO TABS
10.0000 mg | ORAL_TABLET | Freq: Every day | ORAL | 0 refills | Status: DC
Start: 2021-12-20 — End: 2023-02-16

## 2021-12-20 MED ORDER — BUDESONIDE 32 MCG/ACT NA SUSP
1.0000 | Freq: Every day | NASAL | 0 refills | Status: DC
Start: 2021-12-20 — End: 2022-01-20

## 2021-12-20 NOTE — ED Triage Notes (Signed)
Pt c/o SOB on exertion and lt ear pain x2 days. No distress, speaking in complete sentences.  ?

## 2021-12-20 NOTE — ED Provider Notes (Signed)
?[facility] ? ? ? ?CSN: [id number] ?Arrival date & time: 12/20/21  1655 ? ? ?  ? ?History   ?Chief Complaint ?Chief Complaint  ?Patient presents with  ? Shortness of Breath  ? Otitis Media  ? ? ?HPI ?Monique Clark is a 56 y.o. female.  ? ?Pleasant 57 year old female presents today with a primary concern of ear pain.  She states that started several days ago, and is now worse on the left.  She does have a significant history of ear issues in the past, had ear tubes as a child and a tonsillectomy as well.  She had an ear infection in February and was prescribed Cortisporin otic, patient has been using this without relief.  She states every time she opens her jaw or yawns, she hears popping in the left ear.  She is having a hard time hearing currently.  She has not had fever.  She reports some nasal congestion and sinus pressure, but denies sinus tenderness, postnasal drainage, sore throat.  She has also had a intermittent dry nonproductive cough. ?Patient reports mild dyspnea on exertion as well.  She states it started roughly 1 week ago.  She denies a history of CHF.  She denies any worsening edema although she does state some edema in her lower extremities.  She states walking 50 feet will cause her to feel short of breath, she denies any shortness of breath at rest.  She does have a history of reactive airway disease and asthma for which she used to take an inhaler.  She still has the inhaler at home but has not tried it for her current symptoms.  She also states that her symptoms started at the same time her PCP decreased her prednisone from 20 mg daily to 10 mg daily and started methotrexate.  Patient has a known history of PMR.  Patient is not currently smoking.  Patient also has a history of pneumonia and had a VATS procedure years ago. Has hx of PAF, states she is aware of when she goes into A fib and denies any recent or active palpitations. ?Pt also requesting refill of her muscle relaxer. Ran  out in dec. Has hx of back surgery and chronic musculoskeletal issues. She admits this has worked well for her without adverse side effects. ? ? ?Shortness of Breath ?Associated symptoms: ear pain   ? ?Past Medical History:  ?Diagnosis Date  ? Abscess   ? Arthritis   ? Atrial fibrillation ([place])   ? Cardiac arrhythmia due to congenital heart disease   ? trace MR 08/15/20 echo ([place])  ? CVID (common variable immunodeficiency) ([place]) 2015  ? Diverticulosis   ? Elevated cholesterol   ? GERD (gastroesophageal reflux disease)   ? High blood pressure   ? Non Hodgkin's lymphoma ([place]) 1998  ? Obesity   ? Personal history of gestational diabetes   ? PMR (polymyalgia rheumatica) (HCC)   ? Pneumonia   ? PONV (postoperative nausea and vomiting)   ? POTS (postural orthostatic tachycardia syndrome)   ? Pre-diabetes   ? UTI (urinary tract infection)   ? ? ?Patient Active Problem List  ? Diagnosis Date Noted  ? High risk medication use 12/02/2021  ? Otitis externa 11/05/2021  ? PMR (polymyalgia rheumatica) (HCC) 11/05/2021  ? S/P lumbar microdiscectomy 09/27/2021  ? Insomnia 05/28/2021  ? Malignant (primary) neoplasm, unspecified ([place]) 03/08/2021  ? Personal history of urinary (tract) infections 03/08/2021  ? Personal history of pneumonia (recurrent) 03/08/2021  ?  Elevated serum hCG 07/02/2020  ? POTS (postural orthostatic tachycardia syndrome) 03/08/2020  ? Intermittent claudication ([place]) 01/04/2020  ? Mixed hyperlipidemia 03/21/2019  ? Palpitations 03/21/2019  ? Paroxysmal atrial fibrillation (HCC)   ? Localized swelling of left lower extremity   ? Lightheadedness   ? Essential hypertension   ? History of bilateral total hip arthroplasty 07/29/2018  ? Cellulitis 06/22/2017  ? Mitral valve regurgitation 06/22/2017  ? S/P TAH (total abdominal hysterectomy) 03/14/2016  ? Morbid obesity with body mass index (BMI) of 50.0 to 59.9 in adult [facility]) 12/28/2015  ? CVID (common variable immunodeficiency) ([place]) 05/07/2015  ?  Post-nasal discharge 03/30/2013  ? Prediabetes 01/20/2013  ? S/P splenectomy 01/20/2013  ? History of non-Hodgkin's lymphoma 12/21/2012  ? Low back pain radiating to right lower extremity 12/10/2012  ? Right lumbar radiculitis 12/10/2012  ? S/P chole 07/13/2012  ? Cellulitis of breast 01/12/2011  ? History of appendectomy 07/12/1984  ? Mononucleosis 12/08/1983  ? ? ?Past Surgical History:  ?Procedure Laterality Date  ? ABDOMINAL HYSTERECTOMY    ? APPENDECTOMY    ? CESAREAN SECTION    ? GALLBLADDER SURGERY    ? JOINT REPLACEMENT Left   ? hip  ? JOINT REPLACEMENT Right   ? Right Hip 2022  ? left shoulder repair    ? LUMBAR LAMINECTOMY N/A 09/18/2021  ? Procedure: RIGHT LUMBAR FOUR-FIVE MICRODISCECTOMY;  Surgeon: Marybelle Killings, MD;  Location: [place] [place];  Service: Orthopedics;  Laterality: N/A;  ? LUNG REMOVAL, PARTIAL    ? myringostomy Bilateral   ? SPLENECTOMY, TOTAL  1998  ? TONSILLECTOMY AND ADENOIDECTOMY    ? ? ?OB History   ? ? Gravida  ?3  ? Para  ?3  ? Term  ?3  ? Preterm  ?   ? AB  ?   ? Living  ?3  ?  ? ? SAB  ?   ? IAB  ?   ? Ectopic  ?   ? Multiple  ?   ? Live Births  ?   ?   ?  ?  ? ? ? ?Home Medications   ? ?Prior to Admission medications   ?Medication Sig Start Date End Date Taking? Authorizing Provider  ?budesonide (RHINOCORT AQUA) 32 MCG/ACT nasal spray Place 1 spray into both nostrils daily. 12/20/21  Yes Shirlette Scarber L, PA  ?cetirizine (ZYRTEC) 10 MG tablet Take 1 tablet (10 mg total) by mouth daily. 12/20/21  Yes Celest Reitz L, PA  ?montelukast (SINGULAIR) 10 MG tablet Take 1 tablet (10 mg total) by mouth at bedtime. 12/20/21  Yes Daleah Coulson L, PA  ?Acetaminophen (TYLENOL PO) Take by mouth.    [provider]  ?albuterol (VENTOLIN HFA) 108 (90 Base) MCG/ACT inhaler Inhale 2 puffs into the lungs every 6 (six) hours as needed for shortness of breath or wheezing. ?Patient not taking: Reported on 12/02/2021 08/14/21   [provider]  ?Cholecalciferol (VITAMIN D3) 1.25 MG (50000 UT)  CAPS TAKE 1 CAPSULE BY MOUTH TWICE A WEEK AS DIRECTED 12/02/21 12/02/22  Hoyt Koch, MD  ?diphenhydrAMINE (BENADRYL) 25 MG tablet Take 50 mg by mouth See admin instructions. Take 2 tablets (50 mg) by mouth at bedtime as needed for sleep, and take 2 tablets (50 mg) with each dose of Hizentra    [provider]  ?DULoxetine (CYMBALTA) 60 MG capsule Take 1 capsule (60 mg total) by mouth daily. 01/15/21   Hoyt Koch, MD  ?famotidine (PEPCID) 20 MG tablet  Take by mouth.    [provider]  ?flecainide (TAMBOCOR) 100 MG tablet Take 1 tablet by mouth every 12 hours 08/19/21     ?folic acid (FOLVITE) 1 MG tablet Take 1 tablet (1 mg total) by mouth daily. 12/08/21   Rice, Resa Miner, MD  ?Immune Globulin, Human, 4 GM/20ML SOLN Inject 36 g into the skin every 14 (fourteen) days. Monday 05/03/15   [provider]  ?methocarbamol (ROBAXIN) 500 MG tablet Take 1 tablet (500 mg total) by mouth every 6 (six) hours as needed for muscle spasms. 12/20/21   Ndeye Tenorio, Loree Fee L, PA  ?methotrexate (RHEUMATREX) 2.5 MG tablet Take 6 tablets (15 mg total) by mouth once a week. Caution:Chemotherapy. Protect from light. 12/08/21   Collier Salina, MD  ?Multiple Vitamin (MULTIVITAMIN WITH MINERALS) TABS tablet Take 1 tablet by mouth daily.    [provider]  ?nebivolol (BYSTOLIC) 2.5 MG tablet TAKE 1 TABLET BY MOUTH ONCE DAILY 10/01/21     ?predniSONE (DELTASONE) 20 MG tablet On days 1-7 take 2 tablets by mouth daily, on days 8-14 take 1 tablet daily, on days 15-30 take 1/2 tablet daily 11/04/21   Hoyt Koch, MD  ?zolpidem (AMBIEN) 5 MG tablet Take 1 tablet by mouth at bedtime as needed for sleep. 11/11/21   Hoyt Koch, MD  ? ? ?Family History ?Family History  ?Problem Relation Age of Onset  ? Atrial fibrillation Mother   ? Cancer Father   ?     Colon  ? Colon cancer Sister   ? Basal cell carcinoma Brother   ? Atrial fibrillation Maternal Uncle   ? Atrial fibrillation  Maternal Grandmother   ? Atrial fibrillation Maternal Grandfather   ? Heart attack Maternal Grandfather   ? Ovarian cancer Neg Hx   ? ? ?Social History ?Social History  ? ?Tobacco Use  ? Smoking status: Former  ?  P

## 2021-12-20 NOTE — Discharge Instructions (Addendum)
Your ears are hurting likely due to eustachian tube dysfunction.  There is no active infection. ?Please start using budesonide nasal spray daily.  Stop if nasal irritation or bleeding occurs. ?Start taking cetirizine in the morning and montelukast at night.  Please restart your inhaler 2 puffs every 6 hours as needed.  If your shortness of breath continues, please follow-up with your PCP.  Methotrexate also has some pulmonary side effects, so monitor your symptoms. ?Any worsening shortness of breath, cough productive of sputum, fever, please return to clinic or head to the emergency room. ?I have refilled your Robaxin for you. ?

## 2021-12-24 ENCOUNTER — Other Ambulatory Visit: Payer: Self-pay

## 2021-12-24 ENCOUNTER — Ambulatory Visit: Payer: 59

## 2021-12-24 DIAGNOSIS — M6281 Muscle weakness (generalized): Secondary | ICD-10-CM | POA: Diagnosis not present

## 2021-12-24 NOTE — Therapy (Signed)
[place] ?[address] ?[place], [place], [address] ?Phone: [phone]   Fax:  [phone] ? ?Physical Therapy Treatment ? ?Patient Details  ?Name: Monique Clark ?MRN: [id number] ?Date of Birth: October 06, 1965 ?Referring Provider (PT): Kinsinger, MD ? ? ?Encounter Date: 12/24/2021 ? ? PT End of Session - 12/24/21 0949   ? ? Visit Number 2   ? Number of Visits 6   ? Date for PT Re-Evaluation 02/28/22   ? PT Start Time 5456   ? PT Stop Time 1030   ? PT Time Calculation (min) 45 min   ? Activity Tolerance Patient tolerated treatment well   ? Behavior During Therapy [facility] for tasks assessed/performed   ? ?  ?  ? ?  ? ? ?Past Medical History:  ?Diagnosis Date  ? Abscess   ? Arthritis   ? Atrial fibrillation ([place])   ? Cardiac arrhythmia due to congenital heart disease   ? trace MR 08/15/20 echo ([place])  ? CVID (common variable immunodeficiency) ([place]) 2015  ? Diverticulosis   ? Elevated cholesterol   ? GERD (gastroesophageal reflux disease)   ? High blood pressure   ? Non Hodgkin's lymphoma ([place]) 1998  ? Obesity   ? Personal history of gestational diabetes   ? PMR (polymyalgia rheumatica) (HCC)   ? Pneumonia   ? PONV (postoperative nausea and vomiting)   ? POTS (postural orthostatic tachycardia syndrome)   ? Pre-diabetes   ? UTI (urinary tract infection)   ? ? ?Past Surgical History:  ?Procedure Laterality Date  ? ABDOMINAL HYSTERECTOMY    ? APPENDECTOMY    ? CESAREAN SECTION    ? GALLBLADDER SURGERY    ? JOINT REPLACEMENT Left   ? hip  ? JOINT REPLACEMENT Right   ? Right Hip 2022  ? left shoulder repair    ? LUMBAR LAMINECTOMY N/A 09/18/2021  ? Procedure: RIGHT LUMBAR FOUR-FIVE MICRODISCECTOMY;  Surgeon: Marybelle Killings, MD;  Location: [facility];  Service: Orthopedics;  Laterality: N/A;  ? LUNG REMOVAL, PARTIAL    ? myringostomy Bilateral   ? SPLENECTOMY, TOTAL  1998  ? TONSILLECTOMY AND ADENOIDECTOMY    ? ? ?There were no vitals filed for this visit. ? ? Subjective  Assessment - 12/24/21 0948   ? ? Subjective Patient reports that she feels alright today. She noticed Monday that her left foot is starting to turn out.   ? Pertinent History THA in January 2022, lumbar surgery in 08/2021, bilateral THA   ? Limitations Walking   ? How long can you stand comfortably? 1 minute prior to begin to sway and then have to sit down   ? How long can you walk comfortably? 50 feet prior to having to rest due to fatigue   ? Patient Stated Goals walk longer, improved lower extremity strength, stand longer   ? Currently in Pain? No/denies   ? ?  ?  ? ?  ? ? ? ? ? ? ? ? ? ? ? ? ? ? ? ? ? ? ? ? [facility] Adult PT Treatment/Exercise - 12/24/21 0001   ? ?  ? Exercises  ? Exercises Knee/Hip;Shoulder   ?  ? Knee/Hip Exercises: Aerobic  ? Nustep L4 x 15 minutes   ?  ? Knee/Hip Exercises: Standing  ? Knee Flexion Both   2 minutes  ? Rocker Board 3 minutes   ?  ? Knee/Hip Exercises: Seated  ? Long [employer] Both;Weights;2 sets   2 minutes  ?  Long Arc Quad Weight 4 lbs.   ? Clamshell with TheraBand Red   2 minutes  ?  ? Shoulder Exercises: Standing  ? Horizontal ABduction Both;Theraband   1 minute  ? Row Both;Other (comment)   3 minutes  ? ?  ?  ? ?  ? ? ? ? ? ? ? ? ? ? ? ? ? ? ? PT Long Term Goals - 12/18/21 1208   ? ?  ? PT LONG TERM GOAL #1  ? Title Patient will be independent with her HEP.   ? Time 6   ? Period Weeks   ? Status New   ? Target Date 01/29/22   ?  ? PT LONG TERM GOAL #2  ? Title Patient will improve her gait speed to at least 0.8 m/s for improved functional mobility.   ? Time 6   ? Period Weeks   ? Status New   ? Target Date 01/29/22   ?  ? PT LONG TERM GOAL #3  ? Title Patient will be able to improve her five time sit to stand time to 28 seconds or less for improved lower extremity power.   ? Time 6   ? Period Weeks   ? Status New   ? Target Date 01/29/22   ?  ? PT LONG TERM GOAL #4  ? Title Patient will be able to walk at least 150 feet without having to stop and rest.   ? Time 6   ? Period  Weeks   ? Status New   ? Target Date 01/29/22   ? ?  ?  ? ?  ? ? ? ? ? ? ? ? Plan - 12/24/21 0958   ? ? Clinical Impression Statement Patient was introduced to multiple new interventions for improved strength and endurance. She required minimal cuing with today's new interventions for proper pacing and form for improved endurance and strength. Lumbar and lower extremity fatigue were her primary limitation with today's treatment as she required intermittent seated rest breaks. She reported feeling tired upon the conclusion of treatment. She continues to require skilled physical therapy to address her remaining impairments to maximize her functional mobility.   ? Personal Factors and Comorbidities Fitness;Time since onset of injury/illness/exacerbation;Comorbidity 2   ? Comorbidities HTN, a-fib,   ? Examination-Activity Limitations Locomotion Level;Transfers;Stairs;Stand   ? Examination-Participation Restrictions Occupation;Community Activity;Shop;Cleaning   ? Stability/Clinical Decision Making Evolving/Moderate complexity   ? Rehab Potential Good   ? PT Frequency 1x / week   ? PT Duration 6 weeks   ? PT Treatment/Interventions ADLs/Self Care Home Management;Neuromuscular re-education;Therapeutic exercise;Therapeutic activities;Functional mobility training;Stair training;Patient/family education;Energy conservation   ? PT Next Visit Plan Nustep, lower extremity strengthening and endurance interventions   ? PT Home Exercise Plan Access Code: [email or web address]  URL: [email or web address]  Date: 12/24/2021  Prepared by: Jacqulynn Cadet    Exercises  - Seated Knee Extension with Resistance  - 1 x daily - 7 x weekly - 3 sets - 10 reps  - Seated Hip Abduction with Resistance  - 1 x daily - 7 x weekly - 3 sets - 10 reps  - Standing Shoulder Horizontal Abduction with Resistance  - 1 x daily - 7 x weekly - 3 sets - 10 reps  - Standing Shoulder Row with Anchored Resistance  - 1 x daily - 7 x weekly - 3 sets - 10 reps  -  Standing Knee Flexion with Counter Support  - 1 x daily - 7  x weekly - 3 sets - 10 reps  - Standing Heel Raise  - 1 x daily - 7 x weekly - 3 sets - 10 reps   ? Consulted and Agree with Plan of Care Patient   ? ?  ?  ? ?  ? ? ?Patient will benefit from skilled therapeutic intervention in order to improve the following deficits and impairments:  Difficulty walking, Decreased endurance, Decreased activity tolerance, Decreased strength, Decreased mobility ? ?Visit Diagnosis: ?Muscle weakness (generalized) ? ? ? ? ?Problem List ?Patient Active Problem List  ? Diagnosis Date Noted  ? High risk medication use 12/02/2021  ? Otitis externa 11/05/2021  ? PMR (polymyalgia rheumatica) (HCC) 11/05/2021  ? S/P lumbar microdiscectomy 09/27/2021  ? Insomnia 05/28/2021  ? Malignant (primary) neoplasm, unspecified ([place]) 03/08/2021  ? Personal history of urinary (tract) infections 03/08/2021  ? Personal history of pneumonia (recurrent) 03/08/2021  ? Elevated serum hCG 07/02/2020  ? POTS (postural orthostatic tachycardia syndrome) 03/08/2020  ? Intermittent claudication ([place]) 01/04/2020  ? Mixed hyperlipidemia 03/21/2019  ? Palpitations 03/21/2019  ? Paroxysmal atrial fibrillation (HCC)   ? Localized swelling of left lower extremity   ? Lightheadedness   ? Essential hypertension   ? History of bilateral total hip arthroplasty 07/29/2018  ? Cellulitis 06/22/2017  ? Mitral valve regurgitation 06/22/2017  ? S/P TAH (total abdominal hysterectomy) 03/14/2016  ? Morbid obesity with body mass index (BMI) of 50.0 to 59.9 in adult [facility]) 12/28/2015  ? CVID (common variable immunodeficiency) ([place]) 05/07/2015  ? Post-nasal discharge 03/30/2013  ? Prediabetes 01/20/2013  ? S/P splenectomy 01/20/2013  ? History of non-Hodgkin's lymphoma 12/21/2012  ? Low back pain radiating to right lower extremity 12/10/2012  ? Right lumbar radiculitis 12/10/2012  ? S/P chole 07/13/2012  ? Cellulitis of breast 01/12/2011  ? History of appendectomy 07/12/1984  ?  Mononucleosis 12/08/1983  ? ? ?Darlin Coco, PT ?12/24/2021, 12:47 PM ? ?[place] ?[facility] ?[address] ?[place], [place], [address] ?Phone: [phone]   Fax:  [phone]

## 2022-01-01 ENCOUNTER — Other Ambulatory Visit (HOSPITAL_COMMUNITY): Payer: Self-pay

## 2022-01-01 ENCOUNTER — Ambulatory Visit: Payer: 59 | Admitting: Neurology

## 2022-01-01 ENCOUNTER — Encounter: Payer: Self-pay | Admitting: Neurology

## 2022-01-01 ENCOUNTER — Other Ambulatory Visit: Payer: Self-pay | Admitting: Internal Medicine

## 2022-01-01 VITALS — BP 138/86 | HR 57 | Ht 64.5 in | Wt 322.0 lb

## 2022-01-01 DIAGNOSIS — D8989 Other specified disorders involving the immune mechanism, not elsewhere classified: Secondary | ICD-10-CM | POA: Diagnosis not present

## 2022-01-01 DIAGNOSIS — E662 Morbid (severe) obesity with alveolar hypoventilation: Secondary | ICD-10-CM

## 2022-01-01 DIAGNOSIS — I480 Paroxysmal atrial fibrillation: Secondary | ICD-10-CM

## 2022-01-01 DIAGNOSIS — Z6841 Body Mass Index (BMI) 40.0 and over, adult: Secondary | ICD-10-CM

## 2022-01-01 DIAGNOSIS — R0683 Snoring: Secondary | ICD-10-CM | POA: Insufficient documentation

## 2022-01-01 DIAGNOSIS — G9332 Myalgic encephalomyelitis/chronic fatigue syndrome: Secondary | ICD-10-CM | POA: Insufficient documentation

## 2022-01-01 DIAGNOSIS — G478 Other sleep disorders: Secondary | ICD-10-CM | POA: Diagnosis not present

## 2022-01-01 DIAGNOSIS — M353 Polymyalgia rheumatica: Secondary | ICD-10-CM

## 2022-01-01 MED ORDER — DULOXETINE HCL 60 MG PO CPEP
60.0000 mg | ORAL_CAPSULE | Freq: Every day | ORAL | 3 refills | Status: DC
  Filled 2022-01-01: qty 90, 90d supply, fill #0
  Filled 2022-03-27: qty 90, 90d supply, fill #1
  Filled 2022-06-01: qty 90, 90d supply, fill #2
  Filled 2022-08-23 – 2022-09-18 (×4): qty 90, 90d supply, fill #3

## 2022-01-01 NOTE — Patient Instructions (Signed)
Atrial Fibrillation  Atrial fibrillation is a type of irregular or rapid heartbeat (arrhythmia). In atrial fibrillation, the top part of the heart (atria) beats in an irregular pattern. This makes the heart unable to pump bloodnormally and effectively. The goal of treatment is to prevent blood clots from forming, control your heart rate, or restore your heartbeat to a normal rhythm. If this condition is not treated, it can cause serious problems, such as a weakened heart muscle (cardiomyopathy) or a stroke. What are the causes? This condition is often caused by medical conditions that damage the heart's electrical system. These include: High blood pressure (hypertension). This is the most common cause. Certain heart problems or conditions, such as heart failure, coronary artery disease, heart valve problems, or heart surgery. Diabetes. Overactive thyroid (hyperthyroidism). Obesity. Chronic kidney disease. In some cases, the cause of this condition is not known. What increases the risk? This condition is more likely to develop in: Older people. People who smoke. Athletes who do endurance exercise. People who have a family history of atrial fibrillation. Men. People who use drugs. People who drink a lot of alcohol. People who have lung conditions, such as emphysema, pneumonia, or COPD. People who have obstructive sleep apnea. What are the signs or symptoms? Symptoms of this condition include: A feeling that your heart is racing or beating irregularly. Discomfort or pain in your chest. Shortness of breath. Sudden light-headedness or weakness. Tiring easily during exercise or activity. Fatigue. Syncope (fainting). Sweating. In some cases, there are no symptoms. How is this diagnosed? Your health care provider may detect atrial fibrillation when taking your pulse. If detected, this condition may be diagnosed with: An electrocardiogram (ECG) to check electrical signals of the  heart. An ambulatory cardiac monitor to record your heart's activity for a few days. A transthoracic echocardiogram (TTE) to create pictures of your heart. A transesophageal echocardiogram (TEE) to create even closer pictures of your heart. A stress test to check your blood supply while you exercise. Imaging tests, such as a CT scan or chest X-ray. Blood tests. How is this treated? Treatment depends on underlying conditions and how you feel when you experience atrial fibrillation. This condition may be treated with: Medicines to prevent blood clots or to treat heart rate or heart rhythm problems. Electrical cardioversion to reset the heart's rhythm. A pacemaker to correct abnormal heart rhythm. Ablation to remove the heart tissue that sends abnormal signals. Left atrial appendage closure to seal the area where blood clots can form. In some cases, underlying conditions will be treated. Follow these instructions at home: Medicines Take over-the counter and prescription medicines only as told by your health care provider. Do not take any new medicines without talking to your health care provider. If you are taking blood thinners: Talk with your health care provider before you take any medicines that contain aspirin or NSAIDs, such as ibuprofen. These medicines increase your risk for dangerous bleeding. Take your medicine exactly as told, at the same time every day. Avoid activities that could cause injury or bruising, and follow instructions about how to prevent falls. Wear a medical alert bracelet or carry a card that lists what medicines you take. Lifestyle     Do not use any products that contain nicotine or tobacco, such as cigarettes, e-cigarettes, and chewing tobacco. If you need help quitting, ask your health care provider. Eat heart-healthy foods. Talk with a dietitian to make an eating plan that is right for you. Exercise regularly as told by   care provider. ?Do not drink  alcohol. ?Lose weight if you are overweight. ?Do not use drugs, including cannabis. ?General instructions ?If you have obstructive sleep apnea, manage your condition as told by your health care provider. ?Do not use diet pills unless your health care provider approves. Diet pills can make heart problems worse. ?Keep all follow-up visits as told by your health care provider. This is important. ?Contact a health care provider if you: ?Notice a change in the rate, rhythm, or strength of your heartbeat. ?Are taking a blood thinner and you notice more bruising. ?Tire more easily when you exercise or do heavy work. ?Have a sudden change in weight. ?Get help right away if you have: ? ?Chest pain, abdominal pain, sweating, or weakness. ?Trouble breathing. ?Side effects of blood thinners, such as blood in your vomit, stool, or urine, or bleeding that cannot stop. ?Any symptoms of a stroke. "BE FAST" is an easy way to remember the main warning signs of a stroke: ?B - Balance. Signs are dizziness, sudden trouble walking, or loss of balance. ?E - Eyes. Signs are trouble seeing or a sudden change in vision. ?F - Face. Signs are sudden weakness or numbness of the face, or the face or eyelid drooping on one side. ?A - Arms. Signs are weakness or numbness in an arm. This happens suddenly and usually on one side of the body. ?S - Speech. Signs are sudden trouble speaking, slurred speech, or trouble understanding what people say. ?T - Time. Time to call emergency services. Write down what time symptoms started. ?Other signs of a stroke, such as: ?A sudden, severe headache with no known cause. ?Nausea or vomiting. ?Seizure. ?These symptoms may represent a serious problem that is an emergency. Do not wait to see if the symptoms will go away. Get medical help right away. Call your local emergency services (911 in the [place] Do not drive yourself to the hospital. ?Summary ?Atrial fibrillation is a type of irregular or rapid heartbeat  (arrhythmia). ?Symptoms include a feeling that your heart is beating fast or irregularly. ?You may be given medicines to prevent blood clots or to treat heart rate or heart rhythm problems. ?Get help right away if you have signs or symptoms of a stroke. ?Get help right away if you cannot catch your breath or have chest pain or pressure. ?This information is not intended to replace advice given to you by your health care provider. Make sure you discuss any questions you have with your health care provider. ?Document Revised: 03/09/2019 Document Reviewed: 03/09/2019 ?Elsevier Patient Education ? [place]. ?Sleep Apnea ?Sleep apnea affects breathing during sleep. It causes breathing to stop for 10 seconds or more, or to become shallow. People with sleep apnea usually snore loudly. ?It can also increase the risk of: ?Heart attack. ?Stroke. ?Being very overweight (obese). ?Diabetes. ?Heart failure. ?Irregular heartbeat. ?High blood pressure. ?The goal of treatment is to help you breathe normally again. ?What are the causes? ?The most common cause of this condition is a collapsed or blocked airway. ?There are three kinds of sleep apnea: ?Obstructive sleep apnea. This is caused by a blocked or collapsed airway. ?Central sleep apnea. This happens when the brain does not send the right signals to the muscles that control breathing. ?Mixed sleep apnea. This is a combination of obstructive and central sleep apnea. ?What increases the risk? ?Being overweight. ?Smoking. ?Having a small airway. ?Being older. ?Being female. ?Drinking alcohol. ?Taking medicines to calm yourself (sedatives or  tranquilizers). ?Having family members with the condition. ?Having a tongue or tonsils that are larger than normal. ?What are the signs or symptoms? ?Trouble staying asleep. ?Loud snoring. ?Headaches in the morning. ?Waking up gasping. ?Dry mouth or sore throat in the morning. ?Being sleepy or tired during the day. ?If you are sleepy or  tired during the day, you may also: ?Not be able to focus your mind (concentrate). ?Forget things. ?Get angry a lot and have mood swings. ?Feel sad (depressed). ?Have changes in your personality. ?Have less interes

## 2022-01-01 NOTE — Progress Notes (Signed)
? ? ?SLEEP MEDICINE CLINIC ?  ? ?Provider:  Larey Seat, MD  ?Primary Care Physician:  Hoyt Koch, MD ?[address] ?[place] [address]  ? ?  ?Referring Provider: Kinsinger, Arta Bruce, Md ?[address] ?[address] ?[place],  [place] [address]  ?  ?  ?    ?Chief Complaint according to patient   ?Patient presents with:  ?  ? New Patient (Initial Visit)  ?   Pt alone, rm 11. Presents today for sleep consult eval to prepare for gastric bypass surgery. She had a SS 10+ years ago and It was negative for OSA. She avg about 7/8 hrs of sleep but broken. Wakes frequently tossing and turning and bathroom. Able to fall back asleep easily. C/o fatigue during the daytime.   ?  ?  ?HISTORY OF PRESENT ILLNESS:  ?Monique Clark is a 56 y.o. Caucasian female patient seen here as a referral on 01/01/2022 from Dr. Kieth Brightly for a pre bariatric surgery evaluation of sleep apnea. Marland Kitchen  ?Chief concern according to patient :  Pt alone, rm 11. Presents today for sleep consult eval to prepare for gastric bypass surgery. She had a SS 10+ years ago and It was negative for OSA. She only sleeps prone, She avg about 7/8 hrs of sleep but broken. Wakes frequently tossing and turning and bathroom. Able to fall back asleep easily. C/o fatigue during the daytime.  ?  ?Monique Clark  has a past medical history of Abscess, Arthritis, Atrial fibrillation ([place]), atrial fibrillation, paroxysmal. , CVID (common variable immunodeficiency) ([facility]) (2015), Diverticulosis, Elevated cholesterol, GERD (gastroesophageal reflux disease), High blood pressure, History of Non Hodgkin's lymphoma ([place]) (1998), Splenectomy, Obesity, history of gestational diabetes, PMR (polymyalgia rheumatica) ([facility]), Pneumonia, PONV (postoperative nausea and vomiting), POTS (postural orthostatic tachycardia syndrome), Pre-diabetes, and UTI (urinary tract infection). ?She reports fatigue, myalgia, is on prednisone.  ?  ?Sleep relevant medical history: Nocturia 2  ,Tonsillectomy and adenoid ectomy, ear tubes, sinusitis, allergies, pneumonia.   Family medical /sleep history: Mother on CPAP with OSA.  ?  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 The patient currently daytime works/ used to work in shifts( night/ rotating,) until 4 years ago.  ? ?Tobacco use: 2004.  ETOH use : rare,  ?Caffeine intake in form of Coffee( 1 cup in AM ) Soda( 1 coke a day ) Tea (  1 glass or cup a day seasonal) or energy drinks. ? ?  ?Sleep habits are as follows: The patient's dinner time is between 5 PM. The patient goes to bed at 10 PM and continues to sleep for 7-8 hours, wakes for 1-2 bathroom breaks,.   ?The preferred sleep position is prone , with the support of 2 pillows.  ?Dreams are reportedly frequent/vivid.  ?6  AM is the usual rise time. The patient wakes up spontaneously./ She reports not feeling refreshed or restored in AM, with symptoms such as dry mouth, morning headaches, temporal headaches, stiffness, sore, and residual fatigue.  ?Naps are taken infrequently. ?  ?Review of Systems: ?Out of a complete 14 system review, the patient complains of only the following symptoms, and all other reviewed systems are negative.:  ?Fatigue, sleepiness , snoring, fragmented sleep, Insomnia treated with ambien.  ?  ?How likely are you to doze in the following  situations: ?0 = not likely, 1 = slight chance, 2 = moderate chance, 3 = high chance ?  ?Sitting and Reading? ?Watching Television? ?Sitting inactive in a public place (theater or meeting)? ?As a passenger in a car for an hour without a break? ?Lying down in the afternoon when circumstances permit? ?Sitting and talking to someone? ?Sitting quietly after lunch without alcohol? ?In a car, while stopped for a few minutes in traffic? ?  ?Total = 12/ 24 points  ? FSS endorsed at 57/ 63  points.  ? ?Rheumatologist Dr. Benjamine Mola.  ? ?Social History  ? ?Socioeconomic History  ? Marital status: Married  ?  Spouse name: Not on file  ? Number of children: 3  ? Years of education: Not on file  ? Highest education level: Not on file  ?Occupational History  ? Occupation: rn  ?Tobacco Use  ? Smoking status: Former  ?  Packs/day: 0.50  ?  Years: 20.00  ?  Pack years: 10.00  ?  Types: Cigarettes  ?  Quit date: 09/29/2002  ?  Years since quitting: 19.2  ? Smokeless tobacco: Never  ?Vaping Use  ? Vaping Use: Never used  ?Substance and Sexual Activity  ? Alcohol use: No  ? Drug use: No  ? Sexual activity: Yes  ?  Birth control/protection: Surgical, Post-menopausal  ?  Comment: 1st intercourse 85 yo-5 partners  ?Other Topics Concern  ? Not on file  ?Social History Narrative  ? Not on file  ? ?Social Determinants of Health  ? ?Financial Resource Strain: Not on file  ?Food Insecurity: Not on file  ?Transportation Needs: Not on file  ?Physical Activity: Not on file  ?Stress: Not on file  ?Social Connections: Not on file  ? ? ?Family History  ?Problem Relation Age of Onset  ? Atrial fibrillation Mother   ? Cancer Father   ?     Colon  ? Colon cancer Sister   ? Basal cell carcinoma Brother   ? Atrial fibrillation Maternal Uncle   ? Atrial fibrillation Maternal Grandmother   ? Atrial fibrillation Maternal Grandfather   ? Heart attack Maternal Grandfather   ? Ovarian cancer Neg Hx   ? ? ?Past Medical History:  ?Diagnosis Date  ? Abscess   ? Arthritis   ? Atrial fibrillation ([facility])   ? Cardiac arrhythmia due to congenital heart disease   ? trace MR 08/15/20 echo ([facility])  ? CVID (common variable immunodeficiency) ([facility]) 2015  ? Diverticulosis   ? Elevated cholesterol   ? GERD (gastroesophageal reflux disease)   ? High blood pressure   ? Non Hodgkin's lymphoma ([facility] [facility]) 1998  ? Obesity   ? Personal history of gestational diabetes   ? PMR (polymyalgia rheumatica) ([facility])   ? Pneumonia   ? PONV (postoperative nausea and  vomiting)   ? POTS (postural orthostatic tachycardia syndrome)   ? Pre-diabetes   ? UTI (urinary tract infection)   ? ? ?Past Surgical History:  ?Procedure Laterality Date  ? ABDOMINAL HYSTERECTOMY    ? APPENDECTOMY    ? CESAREAN SECTION    ? GALLBLADDER SURGERY    ? JOINT REPLACEMENT Left   ? hip  ? JOINT REPLACEMENT Right   ? Right Hip 2022  ? left shoulder repair    ? LUMBAR LAMINECTOMY N/A 09/18/2021  ? Procedure: RIGHT LUMBAR FOUR-FIVE MICRODISCECTOMY;  Surgeon: Marybelle Killings, MD;  Location: [facility];  Service: Orthopedics;  Laterality: N/A;  ? LUNG REMOVAL, PARTIAL    ?  myringostomy Bilateral   ? SPLENECTOMY, TOTAL  1998  ? TONSILLECTOMY AND ADENOIDECTOMY    ?  ? ?Current Outpatient Medications on File Prior to Visit  ?Medication Sig Dispense Refill  ? Acetaminophen (TYLENOL PO) Take by mouth.    ? albuterol (VENTOLIN HFA) 108 (90 Base) MCG/ACT inhaler Inhale 2 puffs into the lungs every 6 (six) hours as needed for shortness of breath or wheezing.    ? budesonide (RHINOCORT AQUA) 32 MCG/ACT nasal spray Place 1 spray into both nostrils daily. 8.43 mL 0  ? cetirizine (ZYRTEC) 10 MG tablet Take 1 tablet (10 mg total) by mouth daily. 30 tablet 0  ? Cholecalciferol (VITAMIN D3) 1.25 MG (50000 UT) CAPS TAKE 1 CAPSULE BY MOUTH TWICE A WEEK AS DIRECTED 8 capsule 5  ? diphenhydrAMINE (BENADRYL) 25 MG tablet Take 50 mg by mouth See admin instructions. Take 2 tablets (50 mg) by mouth at bedtime as needed for sleep, and take 2 tablets (50 mg) with each dose of Hizentra    ? DULoxetine (CYMBALTA) 60 MG capsule Take 1 capsule (60 mg total) by mouth daily. 90 capsule 3  ? famotidine (PEPCID) 20 MG tablet Take by mouth.    ? flecainide (TAMBOCOR) 100 MG tablet Take 1 tablet by mouth every 12 hours 180 tablet 3  ? folic acid (FOLVITE) 1 MG tablet Take 1 tablet (1 mg total) by mouth daily. 90 tablet 0  ? Immune Globulin, Human, 4 GM/20ML SOLN Inject 36 g into the skin every 14 (fourteen) days. Monday    ? methocarbamol (ROBAXIN)  500 MG tablet Take 1 tablet (500 mg total) by mouth every 6 (six) hours as needed for muscle spasms. 60 tablet 0  ? methotrexate (RHEUMATREX) 2.5 MG tablet Take 6 tablets (15 mg total) by mouth once a week. Caution:Ch

## 2022-01-04 ENCOUNTER — Other Ambulatory Visit (HOSPITAL_COMMUNITY): Payer: Self-pay

## 2022-01-04 NOTE — Progress Notes (Signed)
? ?Office Visit Note ? ?Patient: Monique Clark             ?Date of Birth: Dec 04, 1965           ?MRN: [id number]             ?PCP: Hoyt Koch, MD ?Referring: Hoyt Koch, * ?Visit Date: 01/06/2022 ? ? ?Subjective:  ?Arthritis (Fatigue) ? ? ?History of Present Illness: Monique Clark is a 56 y.o. female here for follow up for PMR on prednisone 10 mg daily and started methotrexate 10 mg PO weekly last month due to persistent inflammatory lab elevations.  She continues having fatigue is persistent.  Shortness of breath with walking and exertion is slightly improved.  She has not noticed any side effects or intolerance to methotrexate.  She is continuing to try to work on her exercise as a part of preparation for bariatric surgery also awaiting upcoming cardiac evaluation. ? ? ?Previous HPI ?12/02/21 ?Monique Clark is a 56 y.o. female here for muscle pain and weakness in shoulders and legs concern for history of PMR previously treated with long term prednisone.  She has a history of CVID on IVIG, non hodgkins lymphoma, also has history of bilateral hip replacement. Recently had microdiscectomy about 6 weeks prior. Started prednisone 40 mg daily last month decreased to 20 mg daily and now is down to 10 mg dose. She felt her symptoms improve about 80-90% when starting prednisone again and tapering has been okay, only slight increase with this dose reduction. She does not see any particular swelling, discoloration, or rashes. She has noticed some hand tremor more when using her hands that is not typical for her. ? ? ?Review of Systems  ?Constitutional:  Positive for fatigue.  ?HENT:  Positive for mouth dryness.   ?Eyes:  Positive for dryness.  ?Respiratory:  Negative for shortness of breath.   ?Cardiovascular:  Positive for swelling in legs/feet.  ?Gastrointestinal:  Negative for constipation.  ?Endocrine: Positive for heat intolerance.  ?Genitourinary:  Negative for difficulty urinating.   ?Musculoskeletal:  Positive for joint pain, joint pain, joint swelling, muscle weakness, morning stiffness and muscle tenderness.  ?Skin:  Negative for rash.  ?Allergic/Immunologic: Positive for susceptible to infections.  ?Neurological:  Positive for weakness.  ?Hematological:  Positive for bruising/bleeding tendency.  ?Psychiatric/Behavioral:  Positive for sleep disturbance.   ? ?PMFS History:  ?Patient Active Problem List  ? Diagnosis Date Noted  ? Class 3 obesity with alveolar hypoventilation without serious comorbidity with body mass index (BMI) of 50.0 to 59.9 in adult [facility]) 01/01/2022  ? Snoring 01/01/2022  ? Non-restorative sleep 01/01/2022  ? Chronic fatigue and immune dysfunction syndrome ([place]) 01/01/2022  ? High risk medication use 12/02/2021  ? Otitis externa 11/05/2021  ? PMR (polymyalgia rheumatica) ([facility]) 11/05/2021  ? S/P lumbar microdiscectomy 09/27/2021  ? Insomnia 05/28/2021  ? Malignant (primary) neoplasm, unspecified ([place]) 03/08/2021  ? Personal history of urinary (tract) infections 03/08/2021  ? Personal history of pneumonia (recurrent) 03/08/2021  ? Elevated serum hCG 07/02/2020  ? POTS (postural orthostatic tachycardia syndrome) 03/08/2020  ? Intermittent claudication ([place]) 01/04/2020  ? Mixed hyperlipidemia 03/21/2019  ? Palpitations 03/21/2019  ? Paroxysmal atrial fibrillation ([facility])   ? Localized swelling of left lower extremity   ? Lightheadedness   ? Essential hypertension   ? History of bilateral total hip arthroplasty 07/29/2018  ? Cellulitis 06/22/2017  ? Mitral valve regurgitation 06/22/2017  ? S/P TAH (total abdominal hysterectomy) 03/14/2016  ? Morbid obesity with  body mass index (BMI) of 50.0 to 59.9 in adult [facility]) 12/28/2015  ? CVID (common variable immunodeficiency) ([place]) 05/07/2015  ? Post-nasal discharge 03/30/2013  ? Prediabetes 01/20/2013  ? S/P splenectomy 01/20/2013  ? History of non-Hodgkin's lymphoma 12/21/2012  ? Low back pain radiating to right lower extremity  12/10/2012  ? Right lumbar radiculitis 12/10/2012  ? S/P chole 07/13/2012  ? Cellulitis of breast 01/12/2011  ? History of appendectomy 07/12/1984  ? Mononucleosis 12/08/1983  ?  ?Past Medical History:  ?Diagnosis Date  ? Abscess   ? Arthritis   ? Atrial fibrillation ([place])   ? Cardiac arrhythmia due to congenital heart disease   ? trace MR 08/15/20 echo ([place])  ? CVID (common variable immunodeficiency) ([place]) 2015  ? Diverticulosis   ? Elevated cholesterol   ? GERD (gastroesophageal reflux disease)   ? High blood pressure   ? Non Hodgkin's lymphoma ([place]) 1998  ? Obesity   ? Personal history of gestational diabetes   ? PMR (polymyalgia rheumatica) ([facility])   ? Pneumonia   ? PONV (postoperative nausea and vomiting)   ? POTS (postural orthostatic tachycardia syndrome)   ? Pre-diabetes   ? UTI (urinary tract infection)   ?  ?Family History  ?Problem Relation Age of Onset  ? Atrial fibrillation Mother   ? Cancer Father   ?     Colon  ? Colon cancer Sister   ? Basal cell carcinoma Brother   ? Atrial fibrillation Maternal Uncle   ? Atrial fibrillation Maternal Grandmother   ? Atrial fibrillation Maternal Grandfather   ? Heart attack Maternal Grandfather   ? Ovarian cancer Neg Hx   ? ?Past Surgical History:  ?Procedure Laterality Date  ? ABDOMINAL HYSTERECTOMY    ? APPENDECTOMY    ? CESAREAN SECTION    ? GALLBLADDER SURGERY    ? JOINT REPLACEMENT Left   ? hip  ? JOINT REPLACEMENT Right   ? Right Hip 2022  ? left shoulder repair    ? LUMBAR LAMINECTOMY N/A 09/18/2021  ? Procedure: RIGHT LUMBAR FOUR-FIVE MICRODISCECTOMY;  Surgeon: Marybelle Killings, MD;  Location: [facility];  Service: Orthopedics;  Laterality: N/A;  ? LUNG REMOVAL, PARTIAL    ? myringostomy Bilateral   ? SPLENECTOMY, TOTAL  1998  ? TONSILLECTOMY AND ADENOIDECTOMY    ? ?Social History  ? ?Social History Narrative  ? Not on file  ? ?Immunization History  ?Administered Date(s) Administered  ? Influenza-Unspecified 07/27/2020  ? PFIZER(Purple Top)SARS-COV-2  Vaccination 09/24/2019, 10/12/2019  ?  ? ?Objective: ?Vital Signs: BP (!) 146/88 (BP Location: Right Arm, Patient Position: Sitting, Cuff Size: Normal)   Pulse 69   Resp 16   Ht '5\' 5"'$  (1.651 m)   Wt (!) 322 lb (146.1 kg)   BMI 53.58 kg/m?   ? ?Physical Exam ?Constitutional:   ?   Appearance: She is obese.  ?Cardiovascular:  ?   Rate and Rhythm: Normal rate and regular rhythm.  ?Pulmonary:  ?   Effort: Pulmonary effort is normal.  ?   Breath sounds: Normal breath sounds.  ?Skin: ?   General: Skin is warm and dry.  ?   Findings: No rash.  ?Neurological:  ?   Mental Status: She is alert.  ?Psychiatric:     ?   Mood and Affect: Mood normal.  ?  ? ?Musculoskeletal Exam:  ?Bilateral shoulder tenderness to pressure and with resisted abduction but active and passive range of motion is intact ?Elbows full ROM no  tenderness or swelling ?Wrists full ROM no tenderness or swelling ?Fingers full ROM no tenderness or swelling ?Hip normal internal and external rotation without pain, no tenderness to lateral hip palpation ?Knees full ROM no tenderness or swelling ? ? ?Investigation: ?No additional findings. ? ?Imaging: ?No results found. ? ?Recent Labs: ?Lab Results  ?Component Value Date  ? WBC 13.1 (H) 01/06/2022  ? HGB 12.5 01/06/2022  ? PLT 451 (H) 01/06/2022  ? NA 142 01/06/2022  ? K 4.9 01/06/2022  ? CL 103 01/06/2022  ? CO2 30 01/06/2022  ? GLUCOSE 125 (H) 01/06/2022  ? BUN 14 01/06/2022  ? CREATININE 0.59 01/06/2022  ? BILITOT 0.3 01/06/2022  ? ALKPHOS 70 09/16/2021  ? AST 24 01/06/2022  ? ALT 30 (H) 01/06/2022  ? PROT 6.8 01/06/2022  ? ALBUMIN 3.2 (L) 09/16/2021  ? CALCIUM 10.0 01/06/2022  ? GFRAA >60 03/18/2020  ? ? ?Speciality Comments: No specialty comments available. ? ?Procedures:  ?No procedures performed ?Allergies: Tetanus-diphtheria toxoids td, Nizatidine, Promethazine, and Tetanus toxoids  ? ?Assessment / Plan:     ?Visit Diagnoses: PMR (polymyalgia rheumatica) ([place]) - Plan: C-reactive protein, Sedimentation  rate ? ?Symptoms not bad there is some mild bilateral shoulder tenderness she also has persistent fatigue not sure whether this is coming from ongoing inflammation or not.  Rechecking sed rate and CRP now after addition of me

## 2022-01-06 ENCOUNTER — Other Ambulatory Visit (HOSPITAL_COMMUNITY): Payer: Self-pay

## 2022-01-06 ENCOUNTER — Ambulatory Visit: Payer: 59 | Admitting: Internal Medicine

## 2022-01-06 ENCOUNTER — Encounter: Payer: Self-pay | Admitting: Internal Medicine

## 2022-01-06 VITALS — BP 146/88 | HR 69 | Resp 16 | Ht 65.0 in | Wt 322.0 lb

## 2022-01-06 DIAGNOSIS — Z79899 Other long term (current) drug therapy: Secondary | ICD-10-CM | POA: Diagnosis not present

## 2022-01-06 DIAGNOSIS — M353 Polymyalgia rheumatica: Secondary | ICD-10-CM | POA: Diagnosis not present

## 2022-01-07 ENCOUNTER — Ambulatory Visit: Payer: 59

## 2022-01-07 ENCOUNTER — Encounter: Payer: Self-pay | Admitting: Internal Medicine

## 2022-01-07 ENCOUNTER — Ambulatory Visit: Payer: 59 | Attending: General Surgery

## 2022-01-07 DIAGNOSIS — M6281 Muscle weakness (generalized): Secondary | ICD-10-CM

## 2022-01-07 LAB — CBC WITH DIFFERENTIAL/PLATELET
Absolute Monocytes: 694 cells/uL (ref 200–950)
Basophils Absolute: 26 cells/uL (ref 0–200)
Basophils Relative: 0.2 %
Eosinophils Absolute: 0 cells/uL — ABNORMAL LOW (ref 15–500)
Eosinophils Relative: 0 %
HCT: 39.2 % (ref 35.0–45.0)
Hemoglobin: 12.5 g/dL (ref 11.7–15.5)
Lymphs Abs: 2227 cells/uL (ref 850–3900)
MCH: 27.8 pg (ref 27.0–33.0)
MCHC: 31.9 g/dL — ABNORMAL LOW (ref 32.0–36.0)
MCV: 87.3 fL (ref 80.0–100.0)
MPV: 11.8 fL (ref 7.5–12.5)
Monocytes Relative: 5.3 %
Neutro Abs: 10153 cells/uL — ABNORMAL HIGH (ref 1500–7800)
Neutrophils Relative %: 77.5 %
Platelets: 451 10*3/uL — ABNORMAL HIGH (ref 140–400)
RBC: 4.49 10*6/uL (ref 3.80–5.10)
RDW: 15.1 % — ABNORMAL HIGH (ref 11.0–15.0)
Total Lymphocyte: 17 %
WBC: 13.1 10*3/uL — ABNORMAL HIGH (ref 3.8–10.8)

## 2022-01-07 LAB — COMPLETE METABOLIC PANEL WITH GFR
AG Ratio: 1.2 (calc) (ref 1.0–2.5)
ALT: 30 U/L — ABNORMAL HIGH (ref 6–29)
AST: 24 U/L (ref 10–35)
Albumin: 3.7 g/dL (ref 3.6–5.1)
Alkaline phosphatase (APISO): 79 U/L (ref 37–153)
BUN: 14 mg/dL (ref 7–25)
CO2: 30 mmol/L (ref 20–32)
Calcium: 10 mg/dL (ref 8.6–10.4)
Chloride: 103 mmol/L (ref 98–110)
Creat: 0.59 mg/dL (ref 0.50–1.03)
Globulin: 3.1 g/dL (calc) (ref 1.9–3.7)
Glucose, Bld: 125 mg/dL — ABNORMAL HIGH (ref 65–99)
Potassium: 4.9 mmol/L (ref 3.5–5.3)
Sodium: 142 mmol/L (ref 135–146)
Total Bilirubin: 0.3 mg/dL (ref 0.2–1.2)
Total Protein: 6.8 g/dL (ref 6.1–8.1)
eGFR: 106 mL/min/{1.73_m2} (ref >=60)

## 2022-01-07 LAB — SEDIMENTATION RATE: Sed Rate: 74 mm/h — ABNORMAL HIGH (ref 0–30)

## 2022-01-07 LAB — C-REACTIVE PROTEIN: CRP: 33.8 mg/L — ABNORMAL HIGH (ref <8.0)

## 2022-01-07 NOTE — Therapy (Signed)
[place] [facility] ?[address] ?[place], [place], [address] ?Phone: [phone]   Fax:  [phone] ? ?Physical Therapy Treatment ? ?Patient Details  ?Name: Monique Clark ?MRN: [id number] ?Date of Birth: 1965-10-04 ?Referring Provider (PT): Kinsinger, MD ? ? ?Encounter Date: 01/07/2022 ? ? PT End of Session - 01/07/22 1035   ? ? Visit Number 3   ? Number of Visits 6   ? Date for PT Re-Evaluation 02/28/22   ? PT Start Time 1030   ? PT Stop Time 1117   ? PT Time Calculation (min) 47 min   ? Activity Tolerance Patient tolerated treatment well   ? Behavior During Therapy [facility] for tasks assessed/performed   ? ?  ?  ? ?  ? ? ?Past Medical History:  ?Diagnosis Date  ? Abscess   ? Arthritis   ? Atrial fibrillation ([place])   ? Cardiac arrhythmia due to congenital heart disease   ? trace MR 08/15/20 echo ([place])  ? CVID (common variable immunodeficiency) ([place]) 2015  ? Diverticulosis   ? Elevated cholesterol   ? GERD (gastroesophageal reflux disease)   ? High blood pressure   ? Non Hodgkin's lymphoma ([place]) 1998  ? Obesity   ? Personal history of gestational diabetes   ? PMR (polymyalgia rheumatica) (HCC)   ? Pneumonia   ? PONV (postoperative nausea and vomiting)   ? POTS (postural orthostatic tachycardia syndrome)   ? Pre-diabetes   ? UTI (urinary tract infection)   ? ? ?Past Surgical History:  ?Procedure Laterality Date  ? ABDOMINAL HYSTERECTOMY    ? APPENDECTOMY    ? CESAREAN SECTION    ? GALLBLADDER SURGERY    ? JOINT REPLACEMENT Left   ? hip  ? JOINT REPLACEMENT Right   ? Right Hip 2022  ? left shoulder repair    ? LUMBAR LAMINECTOMY N/A 09/18/2021  ? Procedure: RIGHT LUMBAR FOUR-FIVE MICRODISCECTOMY;  Surgeon: Marybelle Killings, MD;  Location: [facility];  Service: Orthopedics;  Laterality: N/A;  ? LUNG REMOVAL, PARTIAL    ? myringostomy Bilateral   ? SPLENECTOMY, TOTAL  1998  ? TONSILLECTOMY AND ADENOIDECTOMY    ? ? ?There were no vitals filed for this visit. ? ? Subjective  Assessment - 01/07/22 1031   ? ? Subjective Patient reports that she is feeling alright today. She notes that she was a little tired after her last appointment.   ? Pertinent History THA in January 2022, lumbar surgery in 08/2021, bilateral THA   ? Limitations Walking   ? How long can you stand comfortably? 1 minute prior to begin to sway and then have to sit down   ? How long can you walk comfortably? 50 feet prior to having to rest due to fatigue   ? Patient Stated Goals walk longer, improved lower extremity strength, stand longer   ? Currently in Pain? No/denies   ? ?  ?  ? ?  ? ? ? ? ? ? ? ? ? ? ? ? ? ? ? ? ? ? ? ? [employer] Adult PT Treatment/Exercise - 01/07/22 0001   ? ?  ? Knee/Hip Exercises: Aerobic  ? Nustep L4 x 15 minutes   ?  ? Knee/Hip Exercises: Standing  ? Lateral Step Up Both;Hand Hold: 2;Step Height: 6"   ? Rocker Board 5 minutes   ?  ? Knee/Hip Exercises: Seated  ? Long [employer] Both;Weights;2 sets   3 minutes  ? Long Arc Quad Weight 5 lbs.   ?  ?  Shoulder Exercises: Seated  ? Other Seated Exercises Therabar bending   up and down; 3 minutes  ?  ? Shoulder Exercises: Power Tower  ? Extension 25 reps   ? Extension Limitations 30#   ? Row 25 reps   30#  ? Row Limitations 30#   ? ?  ?  ? ?  ? ? ? ? ? ? ? ? ? ? ? ? ? ? ? PT Long Term Goals - 12/18/21 1208   ? ?  ? PT LONG TERM GOAL #1  ? Title Patient will be independent with her HEP.   ? Time 6   ? Period Weeks   ? Status New   ? Target Date 01/29/22   ?  ? PT LONG TERM GOAL #2  ? Title Patient will improve her gait speed to at least 0.8 m/s for improved functional mobility.   ? Time 6   ? Period Weeks   ? Status New   ? Target Date 01/29/22   ?  ? PT LONG TERM GOAL #3  ? Title Patient will be able to improve her five time sit to stand time to 28 seconds or less for improved lower extremity power.   ? Time 6   ? Period Weeks   ? Status New   ? Target Date 01/29/22   ?  ? PT LONG TERM GOAL #4  ? Title Patient will be able to walk at least 150 feet without  having to stop and rest.   ? Time 6   ? Period Weeks   ? Status New   ? Target Date 01/29/22   ? ?  ?  ? ?  ? ? ? ? ? ? ? ? Plan - 01/07/22 1035   ? ? Clinical Impression Statement Patient was progressed with multiple new and familiar interventions with moderate difficulty and fatigue. She required minimal cuing with today's interventions for proper exercise performance. She reported feeling tired upon the conclusion of treatment. She continues to require skilled physical therapy to address her remaining impairments to maximize her functional mobility.   ? Personal Factors and Comorbidities Fitness;Time since onset of injury/illness/exacerbation;Comorbidity 2   ? Comorbidities HTN, a-fib,   ? Examination-Activity Limitations Locomotion Level;Transfers;Stairs;Stand   ? Examination-Participation Restrictions Occupation;Community Activity;Shop;Cleaning   ? Stability/Clinical Decision Making Evolving/Moderate complexity   ? Rehab Potential Good   ? PT Frequency 1x / week   ? PT Duration 6 weeks   ? PT Treatment/Interventions ADLs/Self Care Home Management;Neuromuscular re-education;Therapeutic exercise;Therapeutic activities;Functional mobility training;Stair training;Patient/family education;Energy conservation   ? PT Next Visit Plan Nustep, lower extremity strengthening and endurance interventions   ? PT Home Exercise Plan Access Code: [email or web address]  URL: [email or web address]  Date: 12/24/2021  Prepared by: Jacqulynn Cadet    Exercises  - Seated Knee Extension with Resistance  - 1 x daily - 7 x weekly - 3 sets - 10 reps  - Seated Hip Abduction with Resistance  - 1 x daily - 7 x weekly - 3 sets - 10 reps  - Standing Shoulder Horizontal Abduction with Resistance  - 1 x daily - 7 x weekly - 3 sets - 10 reps  - Standing Shoulder Row with Anchored Resistance  - 1 x daily - 7 x weekly - 3 sets - 10 reps  - Standing Knee Flexion with Counter Support  - 1 x daily - 7 x weekly - 3 sets - 10 reps  - Standing Heel Raise   -  1 x daily - 7 x weekly - 3 sets - 10 reps   ? Consulted and Agree with Plan of Care Patient   ? ?  ?  ? ?  ? ? ?Patient will benefit from skilled therapeutic intervention in order to improve the following deficits and impairments:  Difficulty walking, Decreased endurance, Decreased activity tolerance, Decreased strength, Decreased mobility ? ?Visit Diagnosis: ?Muscle weakness (generalized) ? ? ? ? ?Problem List ?Patient Active Problem List  ? Diagnosis Date Noted  ? Class 3 obesity with alveolar hypoventilation without serious comorbidity with body mass index (BMI) of 50.0 to 59.9 in adult [facility]) 01/01/2022  ? Snoring 01/01/2022  ? Non-restorative sleep 01/01/2022  ? Chronic fatigue and immune dysfunction syndrome ([place]) 01/01/2022  ? High risk medication use 12/02/2021  ? Otitis externa 11/05/2021  ? PMR (polymyalgia rheumatica) (HCC) 11/05/2021  ? S/P lumbar microdiscectomy 09/27/2021  ? Insomnia 05/28/2021  ? Malignant (primary) neoplasm, unspecified ([place]) 03/08/2021  ? Personal history of urinary (tract) infections 03/08/2021  ? Personal history of pneumonia (recurrent) 03/08/2021  ? Elevated serum hCG 07/02/2020  ? POTS (postural orthostatic tachycardia syndrome) 03/08/2020  ? Intermittent claudication ([place]) 01/04/2020  ? Mixed hyperlipidemia 03/21/2019  ? Palpitations 03/21/2019  ? Paroxysmal atrial fibrillation (HCC)   ? Localized swelling of left lower extremity   ? Lightheadedness   ? Essential hypertension   ? History of bilateral total hip arthroplasty 07/29/2018  ? Cellulitis 06/22/2017  ? Mitral valve regurgitation 06/22/2017  ? S/P TAH (total abdominal hysterectomy) 03/14/2016  ? Morbid obesity with body mass index (BMI) of 50.0 to 59.9 in adult [facility]) 12/28/2015  ? CVID (common variable immunodeficiency) ([place]) 05/07/2015  ? Post-nasal discharge 03/30/2013  ? Prediabetes 01/20/2013  ? S/P splenectomy 01/20/2013  ? History of non-Hodgkin's lymphoma 12/21/2012  ? Low back pain radiating to right lower  extremity 12/10/2012  ? Right lumbar radiculitis 12/10/2012  ? S/P chole 07/13/2012  ? Cellulitis of breast 01/12/2011  ? History of appendectomy 07/12/1984  ? Mononucleosis 12/08/1983  ? ? ?Darlin Coco, P

## 2022-01-08 ENCOUNTER — Ambulatory Visit: Payer: 59

## 2022-01-08 DIAGNOSIS — I10 Essential (primary) hypertension: Secondary | ICD-10-CM | POA: Diagnosis not present

## 2022-01-08 DIAGNOSIS — I340 Nonrheumatic mitral (valve) insufficiency: Secondary | ICD-10-CM | POA: Diagnosis not present

## 2022-01-08 DIAGNOSIS — I4891 Unspecified atrial fibrillation: Secondary | ICD-10-CM | POA: Diagnosis not present

## 2022-01-08 DIAGNOSIS — Z01818 Encounter for other preprocedural examination: Secondary | ICD-10-CM | POA: Diagnosis not present

## 2022-01-08 DIAGNOSIS — Z887 Allergy status to serum and vaccine status: Secondary | ICD-10-CM | POA: Diagnosis not present

## 2022-01-08 DIAGNOSIS — R7989 Other specified abnormal findings of blood chemistry: Secondary | ICD-10-CM | POA: Diagnosis not present

## 2022-01-08 DIAGNOSIS — D801 Nonfamilial hypogammaglobulinemia: Secondary | ICD-10-CM | POA: Diagnosis not present

## 2022-01-08 DIAGNOSIS — Z7902 Long term (current) use of antithrombotics/antiplatelets: Secondary | ICD-10-CM | POA: Diagnosis not present

## 2022-01-08 DIAGNOSIS — Z7982 Long term (current) use of aspirin: Secondary | ICD-10-CM | POA: Diagnosis not present

## 2022-01-08 DIAGNOSIS — Z888 Allergy status to other drugs, medicaments and biological substances status: Secondary | ICD-10-CM | POA: Diagnosis not present

## 2022-01-08 DIAGNOSIS — Z79899 Other long term (current) drug therapy: Secondary | ICD-10-CM | POA: Diagnosis not present

## 2022-01-08 DIAGNOSIS — Z6841 Body Mass Index (BMI) 40.0 and over, adult: Secondary | ICD-10-CM | POA: Diagnosis not present

## 2022-01-09 ENCOUNTER — Telehealth: Payer: Self-pay | Admitting: Internal Medicine

## 2022-01-09 ENCOUNTER — Other Ambulatory Visit (HOSPITAL_COMMUNITY): Payer: Self-pay

## 2022-01-09 DIAGNOSIS — M353 Polymyalgia rheumatica: Secondary | ICD-10-CM

## 2022-01-09 NOTE — Telephone Encounter (Signed)
Patient called the office stating she had sent a MyChart message on 4/11 regarding her labs and has not received a response. Patient requests a call back with her options with medications.  ?

## 2022-01-10 ENCOUNTER — Other Ambulatory Visit (HOSPITAL_COMMUNITY): Payer: Self-pay

## 2022-01-10 DIAGNOSIS — Z760 Encounter for issue of repeat prescription: Secondary | ICD-10-CM | POA: Diagnosis not present

## 2022-01-10 MED ORDER — METHOTREXATE 2.5 MG PO TABS
15.0000 mg | ORAL_TABLET | ORAL | 2 refills | Status: DC
  Filled 2022-01-10 – 2022-02-07 (×2): qty 30, 35d supply, fill #0
  Filled 2022-03-27: qty 30, 35d supply, fill #1
  Filled 2022-04-27: qty 30, 35d supply, fill #2

## 2022-01-10 MED ORDER — PREDNISONE 10 MG PO TABS
10.0000 mg | ORAL_TABLET | Freq: Every day | ORAL | 2 refills | Status: DC
  Filled 2022-01-10: qty 30, 30d supply, fill #0
  Filled 2022-02-07: qty 30, 30d supply, fill #1
  Filled 2022-03-07: qty 30, 30d supply, fill #2

## 2022-01-10 MED ORDER — FOLIC ACID 1 MG PO TABS
1.0000 mg | ORAL_TABLET | Freq: Every day | ORAL | 0 refills | Status: DC
  Filled 2022-01-10 – 2022-03-07 (×3): qty 90, 90d supply, fill #0

## 2022-01-10 NOTE — Telephone Encounter (Signed)
Next Visit: not scheduled ? ?Last Visit: 01/06/2022 ? ?Dx: PMR  ? ?Current Dose per office note on 01/06/2022: prednisone 10 mg daily  ? ?Okay to refill Prednisone?   ?

## 2022-01-10 NOTE — Telephone Encounter (Signed)
Monique Clark needs a new follow up appointment scheduled in about 4-6 weeks. ? ?FYI- I spoke with Monique Clark her labs show a mixed picture one inflammatory marker increased one decreased. ALT of 30 is a very minimal change we can just monitor at next visit. I recommend continuing current methotrexate 15 mg PO weekly and folic acid 1 mg daily and prednisone at 10 mg daily.

## 2022-01-10 NOTE — Telephone Encounter (Signed)
LMOM for patient to call and schedule follow up in 4-6 weeks. ?

## 2022-01-12 DIAGNOSIS — D801 Nonfamilial hypogammaglobulinemia: Secondary | ICD-10-CM | POA: Diagnosis not present

## 2022-01-13 DIAGNOSIS — D801 Nonfamilial hypogammaglobulinemia: Secondary | ICD-10-CM | POA: Diagnosis not present

## 2022-01-14 ENCOUNTER — Ambulatory Visit: Payer: 59

## 2022-01-14 DIAGNOSIS — M6281 Muscle weakness (generalized): Secondary | ICD-10-CM | POA: Diagnosis not present

## 2022-01-14 NOTE — Therapy (Addendum)
[facility] [place], [place], [address] Phone: [phone]   Fax:  [phone]  Physical Therapy Treatment  Patient Details  Name: Monique Clark MRN: [id number] Date of Birth: Jun 22, 1966 Referring Provider (PT): Kinsinger, MD   Encounter Date: 01/14/2022   PT End of Session - 01/14/22 0956     Visit Number 4    Number of Visits 6    Date for PT Re-Evaluation 02/28/22    PT Start Time 3559    PT Stop Time 7416    PT Time Calculation (min) 42 min    Activity Tolerance Patient tolerated treatment well    Behavior During Therapy [facility] for tasks assessed/performed             Past Medical History:  Diagnosis Date   Abscess    Arthritis    Atrial fibrillation ([place])    Cardiac arrhythmia due to congenital heart disease    trace MR 08/15/20 echo ([place])   CVID (common variable immunodeficiency) ([place]) 2015   Diverticulosis    Elevated cholesterol    GERD (gastroesophageal reflux disease)    High blood pressure    Non Hodgkin's lymphoma ([place]) 1998   Obesity    Personal history of gestational diabetes    PMR (polymyalgia rheumatica) (HCC)    Pneumonia    PONV (postoperative nausea and vomiting)    POTS (postural orthostatic tachycardia syndrome)    Pre-diabetes    UTI (urinary tract infection)     Past Surgical History:  Procedure Laterality Date   ABDOMINAL HYSTERECTOMY     APPENDECTOMY     CESAREAN SECTION     GALLBLADDER SURGERY     JOINT REPLACEMENT Left    hip   JOINT REPLACEMENT Right    Right Hip 2022   left shoulder repair     LUMBAR LAMINECTOMY N/A 09/18/2021   Procedure: RIGHT LUMBAR FOUR-FIVE MICRODISCECTOMY;  Surgeon: Marybelle Killings, MD;  Location: [facility];  Service: Orthopedics;  Laterality: N/A;   LUNG REMOVAL, PARTIAL     myringostomy Bilateral    SPLENECTOMY, TOTAL  1998   TONSILLECTOMY AND ADENOIDECTOMY      There were no vitals filed for this visit.   Subjective  Assessment - 01/14/22 0951     Subjective Patient reports that she feels like she is getting stronger. She is still waiting on the results of her sleep study, but she has completed all of her other requirements for surgery.    Pertinent History THA in January 2022, lumbar surgery in 08/2021, bilateral THA    Limitations Walking    How long can you stand comfortably? 1 minute prior to begin to sway and then have to sit down    How long can you walk comfortably? 50 feet prior to having to rest due to fatigue    Patient Stated Goals walk longer, improved lower extremity strength, stand longer    Currently in Pain? No/denies                               [facility] Adult PT Treatment/Exercise - 01/14/22 0001       Knee/Hip Exercises: Aerobic   Nustep L4-5 x 15 minutes      Knee/Hip Exercises: Standing   Forward Step Up Both;Hand Hold: 2;Other (comment)   onto BOSU (ball up); 3 minutes   Other Standing Knee Exercises Side stepping   3 minutes  Knee/Hip Exercises: Seated   Long Arc Quad Both;Weights;2 sets   3 minutes   Long Arc Quad Weight 5 lbs.      Shoulder Exercises: Seated   Horizontal ABduction Both;Theraband   2 x 1 minute   Theraband Level (Shoulder Horizontal ABduction) Level 3 (Green)    External Rotation Both;Theraband   2x 1 minutes   Theraband Level (Shoulder External Rotation) Level 3 (Green)      Shoulder Exercises: Power Development worker, community Other (comment)   2 minutes   Extension Limitations 30#                          PT Long Term Goals - 12/18/21 1208       PT LONG TERM GOAL #1   Title Patient will be independent with her HEP.    Time 6    Period Weeks    Status New    Target Date 01/29/22      PT LONG TERM GOAL #2   Title Patient will improve her gait speed to at least 0.8 m/s for improved functional mobility.    Time 6    Period Weeks    Status New    Target Date 01/29/22      PT LONG TERM GOAL #3   Title Patient  will be able to improve her five time sit to stand time to 28 seconds or less for improved lower extremity power.    Time 6    Period Weeks    Status New    Target Date 01/29/22      PT LONG TERM GOAL #4   Title Patient will be able to walk at least 150 feet without having to stop and rest.    Time 6    Period Weeks    Status New    Target Date 01/29/22                   Plan - 01/14/22 0957     Clinical Impression Statement Patient was introduced to multiple new interventions for improved upper and lower extremity strength. She required minimal cuing throughtout treatment for rest breaks to avoid a significant increase in fatigue which would result in a deterioration in exercise performance. She reported feeling a little tired and sore upon the conclusion of treatment. She continues to require skilled physical therapy to address her remaining impairments to maximize her functional mobility.    Personal Factors and Comorbidities Fitness;Time since onset of injury/illness/exacerbation;Comorbidity 2    Comorbidities HTN, a-fib,    Examination-Activity Limitations Locomotion Level;Transfers;Stairs;Stand    Examination-Participation Restrictions Occupation;Community Activity;Shop;Cleaning    Stability/Clinical Decision Making Evolving/Moderate complexity    Rehab Potential Good    PT Frequency 1x / week    PT Duration 6 weeks    PT Treatment/Interventions ADLs/Self Care Home Management;Neuromuscular re-education;Therapeutic exercise;Therapeutic activities;Functional mobility training;Stair training;Patient/family education;Energy conservation    PT Next Visit Plan Nustep, lower extremity strengthening and endurance interventions    PT Home Exercise Plan Access Code: [facility]  URL: [email or web address]  Date: 12/24/2021  Prepared by: Jacqulynn Cadet    Exercises  - Seated Knee Extension with Resistance  - 1 x daily - 7 x weekly - 3 sets - 10 reps  - Seated Hip Abduction  with Resistance  - 1 x daily - 7 x weekly - 3 sets - 10 reps  - Standing Shoulder Horizontal Abduction with Resistance  - 1 x daily -  7 x weekly - 3 sets - 10 reps  - Standing Shoulder Row with Anchored Resistance  - 1 x daily - 7 x weekly - 3 sets - 10 reps  - Standing Knee Flexion with Counter Support  - 1 x daily - 7 x weekly - 3 sets - 10 reps  - Standing Heel Raise  - 1 x daily - 7 x weekly - 3 sets - 10 reps    Consulted and Agree with Plan of Care Patient             Patient will benefit from skilled therapeutic intervention in order to improve the following deficits and impairments:  Difficulty walking, Decreased endurance, Decreased activity tolerance, Decreased strength, Decreased mobility  Visit Diagnosis: Muscle weakness (generalized)     Problem List Patient Active Problem List   Diagnosis Date Noted   Class 3 obesity with alveolar hypoventilation without serious comorbidity with body mass index (BMI) of 50.0 to 59.9 in adult ([place]) 01/01/2022   Snoring 01/01/2022   Non-restorative sleep 01/01/2022   Chronic fatigue and immune dysfunction syndrome (HCC) 01/01/2022   High risk medication use 12/02/2021   Otitis externa 11/05/2021   PMR (polymyalgia rheumatica) ([place]) 11/05/2021   S/P lumbar microdiscectomy 09/27/2021   Insomnia 05/28/2021   Malignant (primary) neoplasm, unspecified ([place]) 03/08/2021   Personal history of urinary (tract) infections 03/08/2021   Personal history of pneumonia (recurrent) 03/08/2021   Elevated serum hCG 07/02/2020   POTS (postural orthostatic tachycardia syndrome) 03/08/2020   Intermittent claudication ([place]) 01/04/2020   Mixed hyperlipidemia 03/21/2019   Palpitations 03/21/2019   Paroxysmal atrial fibrillation (HCC)    Localized swelling of left lower extremity    Lightheadedness    Essential hypertension    History of bilateral total hip arthroplasty 07/29/2018   Cellulitis 06/22/2017   Mitral valve regurgitation 06/22/2017   S/P TAH  (total abdominal hysterectomy) 03/14/2016   Morbid obesity with body mass index (BMI) of 50.0 to 59.9 in adult ([place]) 12/28/2015   CVID (common variable immunodeficiency) ([place]) 05/07/2015   Post-nasal discharge 03/30/2013   Prediabetes 01/20/2013   S/P splenectomy 01/20/2013   History of non-Hodgkin's lymphoma 12/21/2012   Low back pain radiating to right lower extremity 12/10/2012   Right lumbar radiculitis 12/10/2012   S/P chole 07/13/2012   Cellulitis of breast 01/12/2011   History of appendectomy 07/12/1984   Mononucleosis 12/08/1983   PHYSICAL THERAPY DISCHARGE SUMMARY  Visits from Start of Care: 4  Current functional level related to goals / functional outcomes: Patient was unable to meet her goals for physical therapy within four visit.    Remaining deficits: Muscular weakness   Education / Equipment: HEP    Patient agrees to discharge. Patient goals were not met. Patient is being discharged due to not returning since the last visit.   Darlin Coco, PT 01/14/2022, 10:40 AM  [facility] [address] [place], [place], [address] Phone: [phone]   Fax:  [phone]  Name: Monique Clark MRN: [id number] Date of Birth: 03-Mar-1966

## 2022-01-15 ENCOUNTER — Other Ambulatory Visit: Payer: Self-pay

## 2022-01-15 ENCOUNTER — Emergency Department (HOSPITAL_COMMUNITY)
Admission: EM | Admit: 2022-01-15 | Discharge: 2022-01-15 | Disposition: A | Discharge status: Home / Self Care | Payer: 59 | Attending: Emergency Medicine | Admitting: Emergency Medicine

## 2022-01-15 ENCOUNTER — Encounter (HOSPITAL_COMMUNITY): Payer: Self-pay | Admitting: Emergency Medicine

## 2022-01-15 ENCOUNTER — Emergency Department (HOSPITAL_COMMUNITY): Payer: 59

## 2022-01-15 DIAGNOSIS — Z7901 Long term (current) use of anticoagulants: Secondary | ICD-10-CM | POA: Diagnosis not present

## 2022-01-15 DIAGNOSIS — L03317 Cellulitis of buttock: Secondary | ICD-10-CM | POA: Diagnosis not present

## 2022-01-15 DIAGNOSIS — R590 Localized enlarged lymph nodes: Secondary | ICD-10-CM | POA: Diagnosis not present

## 2022-01-15 DIAGNOSIS — D72829 Elevated white blood cell count, unspecified: Secondary | ICD-10-CM | POA: Insufficient documentation

## 2022-01-15 DIAGNOSIS — K5730 Diverticulosis of large intestine without perforation or abscess without bleeding: Secondary | ICD-10-CM | POA: Diagnosis not present

## 2022-01-15 DIAGNOSIS — L0231 Cutaneous abscess of buttock: Secondary | ICD-10-CM | POA: Diagnosis present

## 2022-01-15 DIAGNOSIS — M47816 Spondylosis without myelopathy or radiculopathy, lumbar region: Secondary | ICD-10-CM | POA: Diagnosis not present

## 2022-01-15 LAB — BASIC METABOLIC PANEL
Anion gap: 9 (ref 5–15)
BUN: 17 mg/dL (ref 6–20)
CO2: 28 mmol/L (ref 22–32)
Calcium: 9.3 mg/dL (ref 8.9–10.3)
Chloride: 104 mmol/L (ref 98–111)
Creatinine, Ser: 0.77 mg/dL (ref 0.44–1.00)
GFR, Estimated: >60 mL/min (ref >60)
Glucose, Bld: 92 mg/dL (ref 70–99)
Potassium: 4.5 mmol/L (ref 3.5–5.1)
Sodium: 141 mmol/L (ref 135–145)

## 2022-01-15 LAB — URINALYSIS, ROUTINE W REFLEX MICROSCOPIC
Bilirubin Urine: NEGATIVE
Glucose, UA: NEGATIVE mg/dL
Hgb urine dipstick: NEGATIVE
Ketones, ur: NEGATIVE mg/dL
Leukocytes,Ua: NEGATIVE
Nitrite: NEGATIVE
Protein, ur: NEGATIVE mg/dL
Specific Gravity, Urine: >1.046 — ABNORMAL HIGH (ref 1.005–1.030)
pH: 6 (ref 5.0–8.0)

## 2022-01-15 LAB — CBC WITH DIFFERENTIAL/PLATELET
Abs Immature Granulocytes: 0.06 10*3/uL (ref 0.00–0.07)
Basophils Absolute: 0.1 10*3/uL (ref 0.0–0.1)
Basophils Relative: 0 %
Eosinophils Absolute: 0.1 10*3/uL (ref 0.0–0.5)
Eosinophils Relative: 1 %
HCT: 37.8 % (ref 36.0–46.0)
Hemoglobin: 11.8 g/dL — ABNORMAL LOW (ref 12.0–15.0)
Immature Granulocytes: 0 %
Lymphocytes Relative: 44 %
Lymphs Abs: 6.4 10*3/uL — ABNORMAL HIGH (ref 0.7–4.0)
MCH: 28.2 pg (ref 26.0–34.0)
MCHC: 31.2 g/dL (ref 30.0–36.0)
MCV: 90.4 fL (ref 80.0–100.0)
Monocytes Absolute: 1.4 10*3/uL — ABNORMAL HIGH (ref 0.1–1.0)
Monocytes Relative: 10 %
Neutro Abs: 6.6 10*3/uL (ref 1.7–7.7)
Neutrophils Relative %: 45 %
Platelets: 443 10*3/uL — ABNORMAL HIGH (ref 150–400)
RBC: 4.18 MIL/uL (ref 3.87–5.11)
RDW: 18.9 % — ABNORMAL HIGH (ref 11.5–15.5)
WBC: 14.6 10*3/uL — ABNORMAL HIGH (ref 4.0–10.5)
nRBC: 0 % (ref 0.0–0.2)

## 2022-01-15 MED ORDER — IOHEXOL 300 MG/ML  SOLN
100.0000 mL | Freq: Once | INTRAMUSCULAR | Status: AC | PRN
  Administered 2022-01-15: 100 mL via INTRAVENOUS

## 2022-01-15 MED ORDER — DOXYCYCLINE HYCLATE 100 MG PO CAPS: 100.0000 mg | ORAL_CAPSULE | Freq: Two times a day (BID) | ORAL | 0 refills | Status: DC

## 2022-01-15 NOTE — Discharge Instructions (Addendum)
Your testing shows that there may be a skin infection called cellulitis, read the attached instructions.  Please take doxycycline twice a day for the next 10 days to treat this however if you are developing severe or worsening symptoms I would like for you to return to the emergency department soon as possible. ?

## 2022-01-15 NOTE — ED Triage Notes (Signed)
Pt reports possible perirectal abscess. Endorses pelvic fullness and burning.  ?

## 2022-01-15 NOTE — ED Provider Notes (Signed)
?[facility] ?Provider Note ? ? ?CSN: [id number] ?Arrival date & time: 01/15/22  0800 ? ?  ? ?History ? ?Chief Complaint  ?Patient presents with  ? Abscess  ? ? ?Monique Clark is a 56 y.o. female. ? ? ?Abscess ?Associated symptoms: no fever   ? ?This patient is a 56 year old female, she has a history of a prior fistulotomy because of the perirectal abscess, she has a history of atrial fibrillation on rivaroxaban as well as flecainide, she also has a history of polymyalgia and is on multiple medications for that as well including some prednisone.  She presents to the hospital today after having several days of worsening right-sided perirectal discomfort and fullness with a burning sensation.  She has some pain with bowel movements and has been trying to use a stool softener but has not had any fevers chills nausea or vomiting.  She is concerned that she may have a recurrent perirectal abscess ? ?Home Medications ?Prior to Admission medications   ?Medication Sig Start Date End Date Taking? Authorizing Provider  ?doxycycline (VIBRAMYCIN) 100 MG capsule Take 1 capsule (100 mg total) by mouth 2 (two) times daily. 01/15/22  Yes Noemi Chapel, MD  ?Acetaminophen (TYLENOL PO) Take by mouth.    [provider]  ?albuterol (VENTOLIN HFA) 108 (90 Base) MCG/ACT inhaler Inhale 2 puffs into the lungs every 6 (six) hours as needed for shortness of breath or wheezing. 08/14/21   [provider]  ?budesonide (RHINOCORT AQUA) 32 MCG/ACT nasal spray Place 1 spray into both nostrils daily. 12/20/21   Crain, Loree Fee L, PA  ?cetirizine (ZYRTEC) 10 MG tablet Take 1 tablet (10 mg total) by mouth daily. 12/20/21   Chaney Malling, PA  ?Cholecalciferol (VITAMIN D3) 1.25 MG (50000 UT) CAPS TAKE 1 CAPSULE BY MOUTH TWICE A WEEK AS DIRECTED 12/02/21 12/02/22  Hoyt Koch, MD  ?Cholecalciferol 1.25 MG (50000 UT) capsule once a week. ?Patient not taking: Reported on 01/06/2022 04/09/21 12/02/22  [provider]  ?diphenhydrAMINE (BENADRYL) 25 MG tablet Take 50 mg by mouth See admin instructions. Take 2 tablets (50 mg) by mouth at bedtime as needed for sleep, and take 2 tablets (50 mg) with each dose of Hizentra    [provider]  ?DULoxetine (CYMBALTA) 60 MG capsule Take 1 capsule (60 mg total) by mouth daily. 01/01/22   Hoyt Koch, MD  ?famotidine (PEPCID) 20 MG tablet Take by mouth.    [provider]  ?famotidine (PEPCID) 20 MG tablet Take by mouth. ?Patient not taking: Reported on 01/06/2022 07/25/14   [provider]  ?flecainide (TAMBOCOR) 100 MG tablet Take 1 tablet by mouth every 12 hours 08/19/21     ?fluticasone (FLONASE) 50 MCG/ACT nasal spray Place into the nose.    [provider]  ?folic acid (FOLVITE) 1 MG tablet Take 1 tablet (1 mg total) by mouth daily. 01/10/22   Collier Salina, MD  ?ibuprofen (ADVIL) 600 MG tablet Take by mouth.    [provider]  ?Immune Globulin, Human, 4 GM/20ML SOLN Inject 36 g into the skin every 14 (fourteen) days. Monday 05/03/15   [provider]  ?methocarbamol (ROBAXIN) 500 MG tablet Take 1 tablet (500 mg total) by mouth every 6 (six) hours as needed for muscle spasms. 12/20/21   Crain, Loree Fee L, PA  ?methotrexate (RHEUMATREX) 2.5 MG tablet Take 6 tablets (15 mg total) by mouth once a week. Caution:Chemotherapy. Protect from light. 01/10/22   Vernelle Emerald  W, MD  ?montelukast (SINGULAIR) 10 MG tablet Take 1 tablet (10 mg total) by mouth at bedtime. 12/20/21   Chaney Malling, PA  ?Multiple Vitamin (MULTIVITAMIN WITH MINERALS) TABS tablet Take 1 tablet by mouth daily.    [provider]  ?nebivolol (BYSTOLIC) 2.5 MG tablet TAKE 1 TABLET BY MOUTH ONCE DAILY 10/01/21     ?predniSONE (DELTASONE) 10 MG tablet Take 1 tablet (10 mg total) by mouth daily with breakfast. 01/10/22   Rice, Resa Miner, MD  ?rivaroxaban (XARELTO) 20 MG TABS tablet Take by mouth. 10/29/20   [provider]   ?zolpidem (AMBIEN) 5 MG tablet Take 1 tablet by mouth at bedtime as needed for sleep. 11/11/21   Hoyt Koch, MD  ?   ? ?Allergies    ?Tetanus-diphtheria toxoids td, Nizatidine, Promethazine, and Tetanus toxoids   ? ?Review of Systems   ?Review of Systems  ?Constitutional:  Negative for fever.  ? ?Physical Exam ?Updated Vital Signs ?BP (!) 186/93   Pulse (!) 54   Temp 97.8 ?F (36.6 ?C) (Oral)   Resp 15   Ht 1.651 m ('5\' 5"'$ )   Wt (!) 145.2 kg   SpO2 99%   BMI 53.25 kg/m?  ?Physical Exam ?Vitals and nursing note reviewed.  ?Constitutional:   ?   Appearance: She is well-developed. She is not diaphoretic.  ?HENT:  ?   Head: Normocephalic and atraumatic.  ?Eyes:  ?   General:     ?   Right eye: No discharge.     ?   Left eye: No discharge.  ?   Conjunctiva/sclera: Conjunctivae normal.  ?Pulmonary:  ?   Effort: Pulmonary effort is normal. No respiratory distress.  ?Genitourinary: ?   Comments: With nurse chaperone present patient was examined, anal tissues appear normal, there is no significant engorged or tender hemorrhoids, there is no obvious redness or induration but there is a slight fullness in the right perirectal tissue.  This is minimally tender in this area as well, there is no overlying rash ?Skin: ?   General: Skin is warm and dry.  ?   Findings: No erythema or rash.  ?Neurological:  ?   Mental Status: She is alert.  ?   Coordination: Coordination normal.  ?   Comments: Patient ambulates without any difficulty  ? ? ?ED Results / Procedures / Treatments   ?Labs ?(all labs ordered are listed, but only abnormal results are displayed) ?Labs Reviewed  ?CBC WITH DIFFERENTIAL/PLATELET - Abnormal; Notable for the following components:  ?    Result Value  ? WBC 14.6 (*)   ? Hemoglobin 11.8 (*)   ? RDW 18.9 (*)   ? Platelets 443 (*)   ? Lymphs Abs 6.4 (*)   ? Monocytes Absolute 1.4 (*)   ? All other components within normal limits  ?URINALYSIS, ROUTINE W REFLEX MICROSCOPIC - Abnormal; Notable for the  following components:  ? Specific Gravity, Urine >1.046 (*)   ? All other components within normal limits  ?BASIC METABOLIC PANEL  ? ? ?EKG ?None ? ?Radiology ?CT PELVIS W CONTRAST ? ?Result Date: 01/15/2022 ?CLINICAL DATA:  Suspected soft tissue infection, burning in the pelvis and suspected perirectal abscess. EXAM: CT PELVIS WITH CONTRAST TECHNIQUE: Multidetector CT imaging of the pelvis was performed using the standard protocol following the bolus administration of intravenous contrast. RADIATION DOSE REDUCTION: This exam was performed according to the departmental dose-optimization program which includes automated exposure control, adjustment of the mA and/or kV  according to patient size and/or use of iterative reconstruction technique. CONTRAST:  135m OMNIPAQUE IOHEXOL 300 MG/ML  SOLN COMPARISON:  Comparison not available in terms of relevant prior studies. FINDINGS: Urinary Tract: Urinary bladder is collapsed. Streak artifact limiting assessment. No gross distal ureteral distension. Streak artifact arises from bilateral hip arthroplasty changes. Bowel: Fullness about the perianal region of the perineum tracking into LEFT gluteal fold. No visible fluid collection. Asymmetry of soft tissue density may reflect perianal sinus tract or fistula (image 119/5 this extends from the LEFT lateral aspect of the lower sphincter complex/perineum. No fluid collection noted in the labia adjacent to this area or in the upper thigh or gluteal fold into which this area passes. Mild stranding also on the RIGHT without fluid collection. Visualized gastrointestinal tract is otherwise remarkable only for diverticulosis of the sigmoid colon. The rectum with limited assessment due to streak artifact. Vascular/Lymphatic: Scattered lymph nodes throughout the pelvis, none with frank pathologic enlargement. Vascular structures in the pelvis are patent. Reproductive: Likely post hysterectomy without adnexal mass. Area with limited  assessment due to streak artifact. Other: Stranding along the ventral aspect of the patient's pannus. Small amount of soft tissue gas along the LEFT lateral aspect of the patient's pannus in the subcutaneous fat (image 58

## 2022-01-16 LAB — PATHOLOGIST SMEAR REVIEW

## 2022-01-20 ENCOUNTER — Ambulatory Visit: Payer: 59 | Admitting: Internal Medicine

## 2022-01-20 ENCOUNTER — Encounter: Payer: Self-pay | Admitting: Internal Medicine

## 2022-01-20 DIAGNOSIS — L03317 Cellulitis of buttock: Secondary | ICD-10-CM

## 2022-01-20 DIAGNOSIS — D839 Common variable immunodeficiency, unspecified: Secondary | ICD-10-CM

## 2022-01-20 DIAGNOSIS — D72829 Elevated white blood cell count, unspecified: Secondary | ICD-10-CM | POA: Diagnosis not present

## 2022-01-20 NOTE — Assessment & Plan Note (Signed)
She is advised to finish full 10 day course of doxycycline given by ER and let us know if she does not improve or recurrent symptoms. Abdomen pannus examined today and no infection or other abscess noted. Doxycycline will treat her peri-rectal cellulitis.  ?

## 2022-01-20 NOTE — Assessment & Plan Note (Signed)
Suspect this is related to ongoing prednisone usage currently. Pathologist smear review with some atypical cells and warrants reassessment in 2-3 months. If persistently abnormal will refer to heme/onc for flow cytometry testing. ?

## 2022-01-20 NOTE — Assessment & Plan Note (Signed)
We did discuss that this can impact her getting skin and soft tissue infections more often. She is getting IVIG still and last infusion 3-4 weeks ago. Continue without changes to dosing schedule.  ?

## 2022-01-20 NOTE — Progress Notes (Signed)
? ?  Subjective:  ? ?Patient ID: Monique Clark, female    DOB: 21-Aug-1966, 55 y.o.   MRN: [id number] ? ?HPI ?The patient is a 56 YO female coming in for ER follow up.  ? ?Review of Systems  ?Constitutional: Negative.   ?HENT: Negative.    ?Eyes: Negative.   ?Respiratory:  Negative for cough, chest tightness and shortness of breath.   ?Cardiovascular:  Negative for chest pain, palpitations and leg swelling.  ?Gastrointestinal:  Negative for abdominal distention, abdominal pain, constipation, diarrhea, nausea and vomiting.  ?     Recta discomfort  ?Musculoskeletal: Negative.   ?Skin: Negative.   ?Neurological: Negative.   ?Psychiatric/Behavioral: Negative.    ? ?Objective:  ?Physical Exam ?Constitutional:   ?   Appearance: She is well-developed. She is obese.  ?HENT:  ?   Head: Normocephalic and atraumatic.  ?Cardiovascular:  ?   Rate and Rhythm: Normal rate and regular rhythm.  ?Pulmonary:  ?   Effort: Pulmonary effort is normal. No respiratory distress.  ?   Breath sounds: Normal breath sounds. No wheezing or rales.  ?Abdominal:  ?   General: Bowel sounds are normal. There is no distension.  ?   Palpations: Abdomen is soft.  ?   Tenderness: There is no abdominal tenderness. There is no rebound.  ?   Comments: Abdomen pannus examined and no signs of cellulitis or infection or abscess  ?Musculoskeletal:  ?   Cervical back: Normal range of motion.  ?Skin: ?   General: Skin is warm and dry.  ?Neurological:  ?   Mental Status: She is alert and oriented to person, place, and time.  ?   Coordination: Coordination normal.  ? ? ?Vitals:  ? 01/20/22 1016  ?BP: 128/68  ?Pulse: (!) 56  ?Resp: 18  ?SpO2: 96%  ?Weight: (!) 317 lb 9.6 oz (144.1 kg)  ?Height: '5\' 5"'$  (1.651 m)  ? ? ?This visit occurred during the SARS-CoV-2 public health emergency.  Safety protocols were in place, including screening questions prior to the visit, additional usage of staff PPE, and extensive cleaning of exam room while observing appropriate contact  time as indicated for disinfecting solutions.  ? ?Assessment & Plan:  ?Visit time 20 minutes in face to face communication with patient and coordination of care, additional 10 minutes spent in record review, coordination or care, ordering tests, communicating/referring to other healthcare professionals, documenting in medical records all on the same day of the visit for total time 30 minutes spent on the visit.  ? ?

## 2022-01-20 NOTE — Patient Instructions (Signed)
We will have you finish the antibiotic and let us know if this does not go away. ? ? ?

## 2022-01-21 ENCOUNTER — Telehealth: Payer: Self-pay

## 2022-01-21 NOTE — Telephone Encounter (Signed)
LVM for pt to call me back to schedule sleep study  

## 2022-01-28 ENCOUNTER — Telehealth: Payer: Self-pay

## 2022-01-28 DIAGNOSIS — R059 Cough, unspecified: Secondary | ICD-10-CM | POA: Diagnosis not present

## 2022-01-28 DIAGNOSIS — J189 Pneumonia, unspecified organism: Secondary | ICD-10-CM | POA: Diagnosis not present

## 2022-01-28 DIAGNOSIS — R0602 Shortness of breath: Secondary | ICD-10-CM | POA: Diagnosis not present

## 2022-01-28 NOTE — Telephone Encounter (Signed)
Patient called and spoke to the answering service to cancel her sleep study less than 24 hours in advance. If pt wishes to reschedule she would have to pay a $250 cancellation fee before we are able to do so. ?

## 2022-02-04 DIAGNOSIS — Z0289 Encounter for other administrative examinations: Secondary | ICD-10-CM

## 2022-02-07 ENCOUNTER — Other Ambulatory Visit (HOSPITAL_COMMUNITY): Payer: Self-pay

## 2022-02-07 MED ORDER — NEBIVOLOL HCL 5 MG PO TABS
5.0000 mg | ORAL_TABLET | Freq: Every day | ORAL | 4 refills | Status: DC
  Filled 2022-02-07 – 2022-03-07 (×2): qty 30, 30d supply, fill #0
  Filled 2022-04-01: qty 30, 30d supply, fill #1
  Filled 2022-05-06: qty 30, 30d supply, fill #2
  Filled 2022-06-20: qty 30, 30d supply, fill #3
  Filled 2022-07-25: qty 30, 30d supply, fill #4

## 2022-02-07 MED ORDER — NEBIVOLOL HCL 5 MG PO TABS
ORAL_TABLET | ORAL | 4 refills | Status: DC
  Filled 2022-02-07: qty 30, 30d supply, fill #0
  Filled 2022-04-01 – 2022-04-27 (×2): qty 30, 30d supply, fill #1

## 2022-02-11 DIAGNOSIS — D801 Nonfamilial hypogammaglobulinemia: Secondary | ICD-10-CM | POA: Diagnosis not present

## 2022-02-12 NOTE — Telephone Encounter (Signed)
Patient is r/s for 02/18/2022 at 8pm,  ?

## 2022-02-18 ENCOUNTER — Ambulatory Visit (INDEPENDENT_AMBULATORY_CARE_PROVIDER_SITE_OTHER): Payer: 59 | Admitting: Neurology

## 2022-02-18 DIAGNOSIS — G9332 Myalgic encephalomyelitis/chronic fatigue syndrome: Secondary | ICD-10-CM

## 2022-02-18 DIAGNOSIS — D8989 Other specified disorders involving the immune mechanism, not elsewhere classified: Secondary | ICD-10-CM

## 2022-02-18 DIAGNOSIS — I480 Paroxysmal atrial fibrillation: Secondary | ICD-10-CM

## 2022-02-18 DIAGNOSIS — M353 Polymyalgia rheumatica: Secondary | ICD-10-CM

## 2022-02-18 DIAGNOSIS — G4733 Obstructive sleep apnea (adult) (pediatric): Secondary | ICD-10-CM | POA: Diagnosis not present

## 2022-02-18 DIAGNOSIS — E662 Morbid (severe) obesity with alveolar hypoventilation: Secondary | ICD-10-CM

## 2022-02-18 DIAGNOSIS — R0683 Snoring: Secondary | ICD-10-CM

## 2022-02-18 DIAGNOSIS — G478 Other sleep disorders: Secondary | ICD-10-CM

## 2022-02-27 ENCOUNTER — Encounter: Payer: Self-pay | Admitting: Neurology

## 2022-03-07 ENCOUNTER — Other Ambulatory Visit: Payer: Self-pay | Admitting: Internal Medicine

## 2022-03-07 ENCOUNTER — Other Ambulatory Visit (HOSPITAL_COMMUNITY): Payer: Self-pay

## 2022-03-07 MED ORDER — ZOLPIDEM TARTRATE 5 MG PO TABS
5.0000 mg | ORAL_TABLET | Freq: Every evening | ORAL | 5 refills | Status: DC | PRN
  Filled 2022-03-07: qty 30, 30d supply, fill #0
  Filled 2022-04-01 – 2022-04-02 (×2): qty 30, 30d supply, fill #1
  Filled 2022-04-27 – 2022-04-30 (×2): qty 30, 30d supply, fill #2
  Filled 2022-06-20: qty 30, 30d supply, fill #3
  Filled 2022-07-25: qty 30, 30d supply, fill #4
  Filled 2022-08-23: qty 30, 30d supply, fill #5

## 2022-03-10 DIAGNOSIS — D801 Nonfamilial hypogammaglobulinemia: Secondary | ICD-10-CM | POA: Diagnosis not present

## 2022-03-16 ENCOUNTER — Other Ambulatory Visit: Payer: Self-pay | Admitting: Internal Medicine

## 2022-03-16 NOTE — Addendum Note (Signed)
Addended by: Larey Seat on: 03/16/2022 02:37 PM   Modules accepted: Orders

## 2022-03-16 NOTE — Procedures (Signed)
PATIENT'S NAME:  Monique Clark, Monique Clark DOB:      20-Sep-1966      MR#:    [id number]     DATE OF RECORDING: 02/18/2022 Richard Miu REFERRING M.D.:  Gurney Maxin, MD Study Performed:  Split-Night Titration Study HISTORY:  Monique Clark is a 56 y.o. Caucasian female patient seen upon referral on 01/01/2022 from Dr. Kieth Brightly for a pre bariatric surgery evaluation of sleep apnea.  Chief concern according to patient :   Presents today for sleep consult eval to prepare for gastric bypass surgery. She had a SS 10+ years ago which had been negative for OSA. She only sleeps prone.  She averages about 7/8 hours of fragmented sleep, wakes frequently, is reportedly tossing and turning and has to go to the bathroom. Usually, she is able to fall back asleep easily. C/o fatigue during the daytime.    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 She reports fatigue, myalgia, is on prednisone.   The patient endorsed the Epworth Sleepiness Scale at 12/24 points   The patient's weight 322 pounds with a height of 64 (inches), resulting in a BMI of 54.3 kg/m2. The patient's neck circumference measured 17 inches.  CURRENT MEDICATIONS: Tylenol, Ventolin HFA, Rhinocort, Zyrtec, Vitamin D3, Benadryl, Cymbalta, Pepcid, Tambocor, Folvite, Immune Globulin, Robaxin, Rheumatrex, Singulair, Multivitamin, Bystolic, Deltasone, Ambien   PROCEDURE:  This is a multichannel digital polysomnogram utilizing the Somnostar 11.2 system.   Electrodes and sensors were applied and monitored per AASM Specifications.   EEG, EOG, Chin and Limb EMG, were sampled at 200 Hz.  ECG, Snore and Nasal Pressure, Thermal Airflow, Respiratory Effort, CPAP Flow and Pressure, Oximetry was sampled at 50 Hz. Digital video and audio were recorded.      BASELINE STUDY WITHOUT CPAP RESULTS: Lights Out was at 21:02 and Lights On at 04:58.  Total recording time (TRT) was 202, with a total sleep time (TST) of 120 minutes.   The patient's sleep latency was 59 minutes. There was no REM sleep in the first part of this SPLIT protocol study.The sleep efficiency was 59.4 %.    SLEEP ARCHITECTURE: WASO (Wake after sleep onset) was 1 minute, Stage N1 was 3 minutes, Stage N2 was 99.5 minutes, Stage N3 was 17.5 minutes and Stage R (REM sleep) was 0 minutes.  The percentages were Stage N1 2.5%, Stage N2 82.9%, Stage N3 14.6% and Stage R (REM sleep) 0%.   RESPIRATORY ANALYSIS:  There were a total of 74 respiratory events:  74 hypopneas with a hypopnea index of 37. Low oxygen saturation.      The total APNEA/HYPOPNEA INDEX (AHI) was 37 /hour.  There  was no REM sleep and 148 events in NREM. non-REM AHI of 37 /hour.  The patient spent all sleep time in the non supine position 377 minutes. The supine AHI was 0.0 /hour versus a non-supine AHI of 37.0 /hour.  OXYGEN SATURATION & C02:  The wake baseline 02 saturation was 93%, with the lowest being 86%. Time spent below 89% saturation equaled 21 minutes.  PERIODIC LIMB MOVEMENTS: The patient had a total of 0 Periodic Limb Movements.  The Periodic Limb Movement (PLM) index was 0 /  hour and the PLM Arousal index was 0 /hour.  The arousals were noted as: 41 were spontaneous, 0 were associated with PLMs, 16 were associated with respiratory events. Moderate snoring was present.    TITRATION STUDY WITH CPAP RESULTS: The patient qualified for SPLIT protocol!   CPAP was initiated at 5 cmH20 with heated humidity per AASM split night  standards . The patient was fitted with a small P10 nasal pillow and had taken ambien before bedtime. The PAP pressure was advanced to  cmH20 because of hypopneas, apneas and desaturations.  At a PAP pressure of 5 cmH20, there was a reduction of the AHI to 3.4 /hour.  At a final pressure of 8 cm water with 3 cm EPR this AHI became 12.7/h.   Total recording time (TRT) was 275 minutes, with a total sleep time (TST) of 257 minutes. The patient's sleep latency was 8 minutes. REM latency was 44.5 minutes.  The sleep efficiency was 93.5 %.    SLEEP ARCHITECTURE: Wake after sleep was 7.5 minutes, Stage N1 6 minutes, Stage N2 157.5 minutes, Stage N3 26.5 minutes and Stage R (REM sleep) 67 minutes. The percentages were: Stage N1 2.3%, Stage N2 61.3%, Stage N3 10.3% and Stage R (REM sleep) 26.1%. The sleep architecture was notable for REM sleep rebounding .  The arousals were noted as: 39 were spontaneous, 0 were associated with PLMs, 14 were associated with respiratory events.  RESPIRATORY ANALYSIS:  There were a total of 22 respiratory events: There were 22 hypopneas with a hypopnea index of 5.1 /hour.  The total APNEA/HYPOPNEA INDEX (AHI) was 5.1 /hour and the total RESPIRATORY DISTURBANCE INDEX was 5.1 /hour.  0 events occurred in REM sleep and 22 events in NREM. The REM AHI was 0 /hour versus a non-REM AHI of 6.9 /hour.  The patient spent again 0% of total sleep time in the supine position. The supine AHI was 0.0 /hour, versus a non-supine AHI of 5.1/hour.  OXYGEN SATURATION & C02:  The wake baseline 02 saturation was 94%, with the lowest oxygen saturation in sleep being 86%. Time spent below 89% saturation equaled 4 minutes.  PERIODIC LIMB MOVEMENTS:   The patient had a total of 0 Periodic Limb Movements.      POLYSOMNOGRAPHY IMPRESSION :   Obstructive Sleep Hypopnea/ Apnea (OSA) at baseline with mild to moderate oxygen desaturation, suspected to reflect hypoventilation in obesity.   Mild-  moderate Snoring before CPAP. CPAP at low pressures had enough power to overcome the apnea and associated hypoxemia, most effectively at 7 cm water, 3 cm EPR all while using a small nasal pillow.    RECOMMENDATIONS: Auto CPAP 5-10 cm water, 3 cm EPR and small sized nasal pillow for a non- supine sleeper.  CC to Dr Kieth Brightly, please.    A follow up appointment will be scheduled for the time after 60-90 days on therapy in the Sleep Clinic at [facility].      I certify that I have reviewed the entire raw data recording prior to the issuance of this report in accordance with the Standards of Accreditation of the American Academy of Sleep Medicine (AASM)   Larey Seat, M.D. Diplomat, Tax adviser of Neurology  Diplomat, Tax adviser of Sleep Medicine Market researcher, [employer] [facility] at [employer]

## 2022-03-17 ENCOUNTER — Telehealth: Payer: Self-pay | Admitting: *Deleted

## 2022-03-17 ENCOUNTER — Other Ambulatory Visit (HOSPITAL_COMMUNITY): Payer: Self-pay

## 2022-03-17 MED ORDER — RIVAROXABAN 20 MG PO TABS
20.0000 mg | ORAL_TABLET | Freq: Every day | ORAL | 5 refills | Status: DC
  Filled 2022-03-17: qty 30, 30d supply, fill #0
  Filled 2022-04-01: qty 30, 30d supply, fill #1

## 2022-03-17 NOTE — Telephone Encounter (Signed)
-----   Message from Larey Seat, MD sent at 03/16/2022  2:37 PM EDT ----- POLYSOMNOGRAPHY IMPRESSION:   1. The total APNEA/HYPOPNEA INDEX (AHI) was 37 /hour. Oxygen nadir at 86%. Time spent below 89% saturation equaled 21 minutes.= Obstructive Sleep Hypopnea/ Apnea (OSA) at baseline with mild to moderate oxygen desaturation, suspected to reflect hypoventilation in obesity.   2. Mild- moderate Snoring before CPAP. 3. CPAP at low pressures had enough power to overcome the apnea and associated hypoxemia, most effectively at 7 cm water, 3 cm EPR all while using a small nasal pillow.    RECOMMENDATIONS: Auto CPAP 5-10 cm water, 3 cm EPR and small sized nasal pillow for a non- supine sleeper.  CC to Dr Kieth Brightly, please.

## 2022-03-17 NOTE — Telephone Encounter (Signed)
Called pt at [phone].  I advised pt that Dr. Brett Fairy reviewed their sleep study results and found that pt has sleep apnea. Dr. Brett Fairy recommends that pt start auto-cpap. I reviewed PAP compliance expectations with the pt. Pt is agreeable to starting a CPAP. I advised pt that an order will be sent to a DME, [facility], and [facility] will call the pt within about one week after they file with the pt's insurance. [facility] will show the pt how to use the machine, fit for masks, and troubleshoot the CPAP if needed. A follow up appt was made for insurance purposes with Dr. Brett Fairy on 05/19/2022 at 1:30pm. Pt verbalized understanding to arrive 15 minutes early and bring their CPAP. Pt agreed to having mychart sent with all this info.  Pt verbalized understanding of results. Pt had no questions at this time but was encouraged to call back if questions arise. I have sent the order to [facility] and have received confirmation that they have received the order.

## 2022-03-18 ENCOUNTER — Other Ambulatory Visit (HOSPITAL_COMMUNITY): Payer: Self-pay

## 2022-03-18 ENCOUNTER — Other Ambulatory Visit: Payer: Self-pay | Admitting: Internal Medicine

## 2022-03-19 NOTE — Telephone Encounter (Signed)
Rec'd msg " The original prescription was discontinued on 09/19/2021 by Lanae Crumbly, PA-C for the following reason: Stop Taking at Discharge. Pls advise on refill.Marland KitchenJohny Chess

## 2022-03-21 ENCOUNTER — Other Ambulatory Visit (HOSPITAL_COMMUNITY): Payer: Self-pay

## 2022-03-27 ENCOUNTER — Other Ambulatory Visit (HOSPITAL_COMMUNITY): Payer: Self-pay

## 2022-03-28 ENCOUNTER — Other Ambulatory Visit (HOSPITAL_COMMUNITY): Payer: Self-pay

## 2022-04-01 ENCOUNTER — Other Ambulatory Visit: Payer: Self-pay | Admitting: Internal Medicine

## 2022-04-01 ENCOUNTER — Other Ambulatory Visit (HOSPITAL_COMMUNITY): Payer: Self-pay

## 2022-04-02 ENCOUNTER — Other Ambulatory Visit (HOSPITAL_COMMUNITY): Payer: Self-pay

## 2022-04-02 MED ORDER — PREDNISONE 10 MG PO TABS
10.0000 mg | ORAL_TABLET | Freq: Every day | ORAL | 2 refills | Status: DC
  Filled 2022-04-02: qty 30, 30d supply, fill #0
  Filled 2022-04-27: qty 30, 30d supply, fill #1

## 2022-04-03 ENCOUNTER — Other Ambulatory Visit (HOSPITAL_COMMUNITY): Payer: Self-pay

## 2022-04-03 MED ORDER — XARELTO 20 MG PO TABS
ORAL_TABLET | ORAL | 4 refills | Status: DC
  Filled 2022-04-03 – 2022-05-06 (×2): qty 90, 90d supply, fill #0
  Filled 2022-08-23: qty 90, 90d supply, fill #1
  Filled 2022-08-26 – 2022-11-28 (×2): qty 90, 90d supply, fill #2
  Filled 2023-03-13: qty 90, 90d supply, fill #3

## 2022-04-07 DIAGNOSIS — D801 Nonfamilial hypogammaglobulinemia: Secondary | ICD-10-CM | POA: Diagnosis not present

## 2022-04-07 DIAGNOSIS — G4733 Obstructive sleep apnea (adult) (pediatric): Secondary | ICD-10-CM | POA: Diagnosis not present

## 2022-04-10 ENCOUNTER — Other Ambulatory Visit (HOSPITAL_COMMUNITY): Payer: Self-pay

## 2022-04-11 ENCOUNTER — Other Ambulatory Visit (HOSPITAL_COMMUNITY): Payer: Self-pay

## 2022-04-14 NOTE — Progress Notes (Signed)
Surgery orders requested via Epic inbox. °

## 2022-04-15 ENCOUNTER — Ambulatory Visit: Payer: Self-pay | Admitting: General Surgery

## 2022-04-15 NOTE — Patient Instructions (Addendum)
DUE TO SPACE LIMITATIONS, ONLY TWO VISITORS  (aged 56 and older) ARE ALLOWED TO COME WITH YOU AND STAY IN THE WAITING ROOM DURING YOUR PRE OP AND PROCEDURE.   **NO VISITORS ARE ALLOWED IN THE SHORT STAY AREA OR RECOVERY ROOM!!**  IF YOU WILL BE ADMITTED INTO THE HOSPITAL YOU ARE ALLOWED ONLY FOUR SUPPORT PEOPLE DURING VISITATION HOURS (7 AM -8PM)   The support person(s) must pass our screening, and use Hand sanitizing gel. Visitors GUEST BADGE MUST BE WORN VISIBLY  One adult visitor may remain with you overnight and MUST be in the room by 8 P.M.   You are not required to quarantine at this time prior to your surgery. However, you must do this: Hand Hygiene often Do NOT share personal items Notify your provider if you are in close contact with someone who has COVID or you develop fever 100.4 or greater, new onset of sneezing, cough, sore throat, shortness of breath or body aches.       Your procedure is scheduled on:  Tuesday  April 29, 2022  Report to [facility] Main Entrance.  Report to admitting at:  12:00   AM  +++++Call this number if you have any questions or problems the morning of surgery [phone]  Do not eat food :After Midnight the night prior to your surgery/procedure.  After Midnight you may have the following liquids until   11:15 AM DAY OF SURGERY  Clear Liquid Diet Water Black Coffee (sugar ok, NO MILK/CREAM OR CREAMERS)  Tea (sugar ok, NO MILK/CREAM OR CREAMERS) regular and decaf                             Plain Jell-O (NO RED)                                           Fruit ices (not with fruit pulp, NO RED)                                     Popsicles (NO RED)                                                                  Juice: apple, WHITE grape, WHITE cranberry Sports drinks like Gatorade (NO RED)                    The day of surgery:  Drink ONE (1) Pre-Surgery G2 at    11:15    AM the morning of surgery. Drink in one sitting. Do not  sip.  This drink was given to you during your hospital  pre-op appointment visit. Nothing else to drink after completing the Pre-Surgery  G2.   If you have questions, please contact your surgeon's office.   FOLLOW ANY ADDITIONAL PRE OP INSTRUCTIONS YOU RECEIVED FROM YOUR SURGEON'S OFFICE!!!   Oral Hygiene is also important to reduce your risk of infection.        Remember - BRUSH YOUR TEETH THE MORNING OF SURGERY WITH YOUR  REGULAR TOOTHPASTE  Take ONLY these medicines the morning of surgery with A SIP OF WATER: Flecainide (Tambocor), duloxetine (Cymbalta), Nebivolol (Bystolic), methocarbamol (Robaxin), Albuterol inhaler, Flonase,   These are anesthesia recommendations for holding your anticoagulants.  Please contact your prescribing physician to confirm IF it is safe to hold your anticoagulants for this length of time.   Patient has an appointment with Dr. Kieth Brightly on this Friday, to discuss if Xarelto needs to be held 3 days.   Xarelto Rivaroxaban   72 hours (3 Days)     Bring CPAP mask and tubing day of surgery.                   You may not have any metal on your body including hair pins, jewelry, and body piercing  Do not wear make-up, lotions, powders, perfumes or deodorant  Do not wear nail polish including gel and S&S, artificial / acrylic nails, or any other type of covering on natural nails including finger and toenails. If you have artificial nails, gel coating, etc., that needs to be removed by a nail salon, Please have this removed prior to surgery. Not doing so may mean that your surgery could be cancelled or delayed if the Surgeon or anesthesia staff feels like they are unable to monitor you safely.   Do not shave 48 hours prior to surgery to avoid nicks in your skin which may contribute to postoperative infections.   Contacts, Hearing Aids, dentures or bridgework may not be worn into surgery.   You may bring a small overnight bag with you on the day of surgery, only  pack items that are not valuable .[facility] IS NOT RESPONSIBLE   FOR VALUABLES THAT ARE LOST OR STOLEN.   DO NOT [place]. PHARMACY WILL DISPENSE MEDICATIONS LISTED ON YOUR MEDICATION LIST TO YOU DURING YOUR ADMISSION [place]!   Special Instructions: Bring a copy of your healthcare power of attorney and living will documents the day of surgery, if you wish to have them scanned into your [facility] Records- EPIC  Please read over the following fact sheets you were given: IF YOU HAVE QUESTIONS ABOUT YOUR PRE-OP INSTRUCTIONS, PLEASE CALL [phone]  ([place])   [place] - Preparing for Surgery Before surgery, you can play an important role.  Because skin is not sterile, your skin needs to be as free of germs as possible.  You can reduce the number of germs on your skin by washing with CHG (chlorahexidine gluconate) soap before surgery.  CHG is an antiseptic cleaner which kills germs and bonds with the skin to continue killing germs even after washing. Please DO NOT use if you have an allergy to CHG or antibacterial soaps.  If your skin becomes reddened/irritated stop using the CHG and inform your nurse when you arrive at Short Stay. Do not shave (including legs and underarms) for at least 48 hours prior to the first CHG shower.  You may shave your face/neck.  Please follow these instructions carefully:  1.  Shower with CHG Soap the night before surgery and the  morning of surgery.  2.  If you choose to wash your hair, wash your hair first as usual with your normal  shampoo.  3.  After you shampoo, rinse your hair and body thoroughly to remove the shampoo.  4.  Use CHG as you would any other liquid soap.  You can apply chg directly to the skin and wash.  Gently with a scrungie or clean washcloth.  5.  Apply the CHG Soap to your body ONLY FROM THE NECK DOWN.   Do not use on face/ open                           Wound or  open sores. Avoid contact with eyes, ears mouth and genitals (private parts).                       Wash face,  Genitals (private parts) with your normal soap.             6.  Wash thoroughly, paying special attention to the area where your  surgery  will be performed.  7.  Thoroughly rinse your body with warm water from the neck down.  8.  DO NOT shower/wash with your normal soap after using and rinsing off the CHG Soap.            9.  Pat yourself dry with a clean towel.            10.  Wear clean pajamas.            11.  Place clean sheets on your bed the night of your first shower and do not  sleep with pets.  ON THE DAY OF SURGERY : Do not apply any lotions/deodorants the morning of surgery.  Please wear clean clothes to the hospital/surgery center.    FAILURE TO FOLLOW THESE INSTRUCTIONS MAY RESULT IN THE CANCELLATION OF YOUR SURGERY  PATIENT SIGNATURE_________________________________  NURSE SIGNATURE__________________________________  ________________________________________________________________________       Monique Clark    An incentive spirometer is a tool that can help keep your lungs clear and active. This tool measures how well you are filling your lungs with each breath. Taking long deep breaths may help reverse or decrease the chance of developing breathing (pulmonary) problems (especially infection) following: A long period of time when you are unable to move or be active. BEFORE THE PROCEDURE  If the spirometer includes an indicator to show your best effort, your nurse or respiratory therapist will set it to a desired goal. If possible, sit up straight or lean slightly forward. Try not to slouch. Hold the incentive spirometer in an upright position. INSTRUCTIONS FOR USE  Sit on the edge of your bed if possible, or sit up as far as you can in bed or on a chair. Hold the incentive spirometer in an upright position. Breathe out normally. Place the  mouthpiece in your mouth and seal your lips tightly around it. Breathe in slowly and as deeply as possible, raising the piston or the ball toward the top of the column. Hold your breath for 3-5 seconds or for as long as possible. Allow the piston or ball to fall to the bottom of the column. Remove the mouthpiece from your mouth and breathe out normally. Rest for a few seconds and repeat Steps 1 through 7 at least 10 times every 1-2 hours when you are awake. Take your time and take a few normal breaths between deep breaths. The spirometer may include an indicator to show your best effort. Use the indicator as a goal to work toward during each repetition. After each set of 10 deep breaths, practice coughing to be  sure your lungs are clear. If you have an incision (the cut made at the time of surgery), support your incision when coughing by placing a pillow or rolled up towels firmly against it. Once you are able to get out of bed, walk around indoors and cough well. You may stop using the incentive spirometer when instructed by your caregiver.  RISKS AND COMPLICATIONS Take your time so you do not get dizzy or light-headed. If you are in pain, you may need to take or ask for pain medication before doing incentive spirometry. It is harder to take a deep breath if you are having pain. AFTER USE Rest and breathe slowly and easily. It can be helpful to keep track of a log of your progress. Your caregiver can provide you with a simple table to help with this. If you are using the spirometer at home, follow these instructions: [place] IF:  You are having difficultly using the spirometer. You have trouble using the spirometer as often as instructed. Your pain medication is not giving enough relief while using the spirometer. You develop fever of 100.5 F (38.1 C) or higher.                                                                                                    SEEK IMMEDIATE MEDICAL  CARE IF:  You cough up bloody sputum that had not been present before. You develop fever of 102 F (38.9 C) or greater. You develop worsening pain at or near the incision site. MAKE SURE YOU:  Understand these instructions. Will watch your condition. Will get help right away if you are not doing well or get worse. Document Released: 01/26/2007 Document Revised: 12/08/2011 Document Reviewed: 03/29/2007 [facility] Patient Information 2014 [place], [place].     WHAT IS A BLOOD TRANSFUSION? Blood Transfusion Information  A transfusion is the replacement of blood or some of its parts. Blood is made up of multiple cells which provide different functions. Red blood cells carry oxygen and are used for blood loss replacement. White blood cells fight against infection. Platelets control bleeding. Plasma helps clot blood. Other blood products are available for specialized needs, such as hemophilia or other clotting disorders. BEFORE THE TRANSFUSION  Who gives blood for transfusions?  Healthy volunteers who are fully evaluated to make sure their blood is safe. This is blood bank blood. Transfusion therapy is the safest it has ever been in the practice of medicine. Before blood is taken from a donor, a complete history is taken to make sure that person has no history of diseases nor engages in risky social behavior (examples are intravenous drug use or sexual activity with multiple partners). The donor's travel history is screened to minimize risk of transmitting infections, such as malaria. The donated blood is tested for signs of infectious diseases, such as HIV and hepatitis. The blood is then tested to be sure it is compatible with you in order to minimize the chance of a transfusion reaction. If you or a relative donates blood, this is often done in anticipation of surgery and  is not appropriate for emergency situations. It takes many days to process the donated blood. RISKS AND  COMPLICATIONS Although transfusion therapy is very safe and saves many lives, the main dangers of transfusion include:  Getting an infectious disease. Developing a transfusion reaction. This is an allergic reaction to something in the blood you were given. Every precaution is taken to prevent this. The decision to have a blood transfusion has been considered carefully by your caregiver before blood is given. Blood is not given unless the benefits outweigh the risks. AFTER THE TRANSFUSION Right after receiving a blood transfusion, you will usually feel much better and more energetic. This is especially true if your red blood cells have gotten low (anemic). The transfusion raises the level of the red blood cells which carry oxygen, and this usually causes an energy increase. The nurse administering the transfusion will monitor you carefully for complications. HOME CARE INSTRUCTIONS  No special instructions are needed after a transfusion. You may find your energy is better. Speak with your caregiver about any limitations on activity for underlying diseases you may have. SEEK MEDICAL CARE IF:  Your condition is not improving after your transfusion. You develop redness or irritation at the intravenous (IV) site. SEEK IMMEDIATE MEDICAL CARE IF:  Any of the following symptoms occur over the next 12 hours: Shaking chills. You have a temperature by mouth above 102 F (38.9 C), not controlled by medicine. Chest, back, or muscle pain. People around you feel you are not acting correctly or are confused. Shortness of breath or difficulty breathing. Dizziness and fainting. You get a rash or develop hives. You have a decrease in urine output. Your urine turns a dark color or changes to pink, red, or brown. Any of the following symptoms occur over the next 10 days: You have a temperature by mouth above 102 F (38.9 C), not controlled by medicine. Shortness of breath. Weakness after normal activity. The  white part of the eye turns yellow (jaundice). You have a decrease in the amount of urine or are urinating less often. Your urine turns a dark color or changes to pink, red, or brown. Document Released: 09/12/2000 Document Revised: 12/08/2011 Document Reviewed: 05/01/2008 [facility] Patient Information 2014 [place], [place].  _______________________________________________________________________

## 2022-04-15 NOTE — Progress Notes (Addendum)
COVID Vaccine received:  '[]'$  No '[x]'$  Yes  Date of any COVID positive Test in last 90 days:  None  PCP - Pricilla Holm, MD  Cardiologist - Laymond Purser, MD                      [facility]     Cardiac Clearance, ECHO report, EKG on Chart.   Rheumatologist- Vernelle Emerald, MD  Chest x-ray - 11-30-21  Epic EKG -  11-29-21  [facility]  2021? ECHO - fall 2022 at Dr. Georgeanna Lea Office Cardiac Cath -   Pacemaker/ICD device last checked: Date:       '[x]'$  N/A  Bowel Prep - None per patient  History of Sleep Apnea? '[]'$  No '[x]'$  Yes  '[]'$  unknown Sleep Study Date:  02-18-22 epic CPAP used?- '[]'$  No '[x]'$  Yes  (Instruct to bring their mask & Tubing)  Does the patient monitor blood sugar? '[x]'$  No '[]'$  Yes  '[]'$  N/A Pre-DM-   diet only  Blood Thinner Instructions:  Xarelto  Last Dose:  Patient is seeing Dr. Kieth Brightly this Friday and will get clarification on when to stop. Usually stops 3 days prior to surgeries.   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 Fib, Pre-DM, Mild MVR, PONV, anemia  Patient denies shortness of breath, fever, cough and chest pain at PAT appointment  Patient verbalized understanding and agreement to the Pre-Surgical Instructions that were given to them at this PAT appointment. Patient was also educated of the need to review these PAT instructions again prior to his/her surgery.I reviewed the appropriate phone numbers to call if they have any and questions or concerns.

## 2022-04-16 ENCOUNTER — Encounter (HOSPITAL_COMMUNITY): Payer: Self-pay

## 2022-04-16 ENCOUNTER — Encounter (HOSPITAL_COMMUNITY)
Admission: RE | Admit: 2022-04-16 | Discharge: 2022-04-16 | Disposition: A | Discharge status: Home / Self Care | Payer: 59 | Source: Ambulatory Visit | Attending: General Surgery | Admitting: General Surgery

## 2022-04-16 ENCOUNTER — Other Ambulatory Visit: Payer: Self-pay

## 2022-04-16 VITALS — BP 149/84 | HR 51 | Temp 98.2°F | Resp 20 | Ht 65.0 in | Wt 326.0 lb

## 2022-04-16 DIAGNOSIS — Z01812 Encounter for preprocedural laboratory examination: Secondary | ICD-10-CM | POA: Diagnosis not present

## 2022-04-16 DIAGNOSIS — R7303 Prediabetes: Secondary | ICD-10-CM | POA: Diagnosis not present

## 2022-04-16 DIAGNOSIS — Z6841 Body Mass Index (BMI) 40.0 and over, adult: Secondary | ICD-10-CM | POA: Insufficient documentation

## 2022-04-16 LAB — COMPREHENSIVE METABOLIC PANEL
ALT: 18 U/L (ref 0–44)
AST: 21 U/L (ref 15–41)
Albumin: 3.4 g/dL — ABNORMAL LOW (ref 3.5–5.0)
Alkaline Phosphatase: 68 U/L (ref 38–126)
Anion gap: 9 (ref 5–15)
BUN: 21 mg/dL — ABNORMAL HIGH (ref 6–20)
CO2: 28 mmol/L (ref 22–32)
Calcium: 9 mg/dL (ref 8.9–10.3)
Chloride: 98 mmol/L (ref 98–111)
Creatinine, Ser: 0.82 mg/dL (ref 0.44–1.00)
GFR, Estimated: >60 mL/min (ref >60)
Glucose, Bld: 84 mg/dL (ref 70–99)
Potassium: 3.8 mmol/L (ref 3.5–5.1)
Sodium: 135 mmol/L (ref 135–145)
Total Bilirubin: 0.4 mg/dL (ref 0.3–1.2)
Total Protein: 7 g/dL (ref 6.5–8.1)

## 2022-04-16 LAB — CBC WITH DIFFERENTIAL/PLATELET
Abs Immature Granulocytes: 0.06 10*3/uL (ref 0.00–0.07)
Basophils Absolute: 0.1 10*3/uL (ref 0.0–0.1)
Basophils Relative: 1 %
Eosinophils Absolute: 0.1 10*3/uL (ref 0.0–0.5)
Eosinophils Relative: 1 %
HCT: 38.2 % (ref 36.0–46.0)
Hemoglobin: 11.6 g/dL — ABNORMAL LOW (ref 12.0–15.0)
Immature Granulocytes: 0 %
Lymphocytes Relative: 45 %
Lymphs Abs: 7.2 10*3/uL — ABNORMAL HIGH (ref 0.7–4.0)
MCH: 29.1 pg (ref 26.0–34.0)
MCHC: 30.4 g/dL (ref 30.0–36.0)
MCV: 95.7 fL (ref 80.0–100.0)
Monocytes Absolute: 1.5 10*3/uL — ABNORMAL HIGH (ref 0.1–1.0)
Monocytes Relative: 9 %
Neutro Abs: 7.1 10*3/uL (ref 1.7–7.7)
Neutrophils Relative %: 44 %
Platelets: 382 10*3/uL (ref 150–400)
RBC: 3.99 MIL/uL (ref 3.87–5.11)
RDW: 17.7 % — ABNORMAL HIGH (ref 11.5–15.5)
WBC: 16 10*3/uL — ABNORMAL HIGH (ref 4.0–10.5)
nRBC: 0 % (ref 0.0–0.2)

## 2022-04-16 LAB — TYPE AND SCREEN
ABO/RH(D): A POS
Antibody Screen: NEGATIVE

## 2022-04-16 LAB — GLUCOSE, CAPILLARY: Glucose-Capillary: 85 mg/dL (ref 70–99)

## 2022-04-16 LAB — HEMOGLOBIN A1C
Hgb A1c MFr Bld: 5.5 % (ref 4.8–5.6)
Mean Plasma Glucose: 111.15 mg/dL

## 2022-04-18 DIAGNOSIS — I480 Paroxysmal atrial fibrillation: Secondary | ICD-10-CM | POA: Diagnosis not present

## 2022-04-21 ENCOUNTER — Encounter: Payer: Self-pay | Admitting: Dietician

## 2022-04-21 ENCOUNTER — Encounter: Payer: 59 | Attending: Internal Medicine | Admitting: Dietician

## 2022-04-21 DIAGNOSIS — R5383 Other fatigue: Secondary | ICD-10-CM | POA: Diagnosis not present

## 2022-04-21 DIAGNOSIS — I10 Essential (primary) hypertension: Secondary | ICD-10-CM | POA: Insufficient documentation

## 2022-04-21 DIAGNOSIS — I480 Paroxysmal atrial fibrillation: Secondary | ICD-10-CM | POA: Insufficient documentation

## 2022-04-21 NOTE — Progress Notes (Signed)
Pre-Operative Nutrition Class:    Patient was seen on 04/21/2022 for Pre-Operative Bariatric Surgery Education at [facility]    Surgery date: 04/29/2022 Surgery type: Sleeve Gastrectomy  Anthropometrics  Start weight at [facility]: 324.9 lbs (date: 11/25/2021)  Height: 64.5 in Weight today 326.9 BMI: 55.25 kg/m2     Clinical  Medical hx: GDM, cancer: non hodgkin lymphoma, arthritis, hyperlipidemia, hypertension, A-fib, diverticulosis, POTS, PMR Medications: RDW 17, albumin 3.2 Labs: A1C 5.7 Notable signs/symptoms: Reflux Any previous deficiencies? No  Samples given per MNT protocol. Patient educated on appropriate usage: Bariatric Advantage Multivitamin Lot # [id number] Exp: 08/24   Bariatric Advantage Calcium  Lot # [id number] Exp: 01/08/2023   Protein Shake Lot # [id number] / [id number] Exp: 29 Jun 2022 /  29 Sep 2022  The following the learning objectives were met by the patient during this course: Identify Pre-Op Dietary Goals and will begin 2 weeks pre-operatively Identify appropriate sources of fluids and proteins  State protein recommendations and appropriate sources pre and post-operatively Identify Post-Operative Dietary Goals and will follow for 2 weeks post-operatively Identify appropriate multivitamin and calcium sources Describe the need for physical activity post-operatively and will follow MD recommendations State when to call healthcare provider regarding medication questions or post-operative complications When having a diagnosis of diabetes understanding hypoglycemia symptoms and the inclusion of 1 complex carbohydrate per meal  Handouts given during class include: Pre-Op Bariatric Surgery Diet Handout Protein Shake Handout Post-Op Bariatric Surgery Nutrition Handout BEL[facility] Program Information Flyer Support Group Information Flyer [facility] Bariatric Supplements Price List  Follow-Up Plan: Patient will follow-up at  [facility] 2 weeks post operatively for diet advancement per MD.

## 2022-04-23 ENCOUNTER — Other Ambulatory Visit (HOSPITAL_COMMUNITY): Payer: Self-pay

## 2022-04-23 NOTE — Progress Notes (Addendum)
Anesthesia Chart Review   Case: [id number] Date/Time: 04/29/22 1400   Procedures:      LAPAROSCOPIC SLEEVE GASTRECTOMY     UPPER GI ENDOSCOPY   Anesthesia type: General   Pre-op diagnosis: MORBID OBESITY   Location: [facility] ROOM 02 / [facility]   Surgeons: Kinsinger, Arta Bruce, MD       DISCUSSION:56 y.o. former smoker with h/o PONV, sleep apnea on CPAP, atrial fibrillation (on Xarelto), POTS, PMR, non Hodgkin's lymphoma s/p splenectomy, morbid obesity scheduled for above procedure 04/29/2022 with Dr. Gurney Maxin.   Per cardiology note, "Patient will be considered a low to intermediate risk for cardiovascular complications related to gastric sleeve bypass surgery."  Pt instructed to hold Xarelto 3 days prior to surgery.   Anticipate pt can proceed with planned procedure barring acute status change.   VS: BP (!) 149/84   Pulse (!) 51 Comment: patient aware of low pulse, she is asymptomatic  Temp 36.8 C (Oral)   Resp 20   Ht '5\' 5"'$  (1.651 m)   Wt (!) 147.9 kg   SpO2 96%   BMI 54.25 kg/m   PROVIDERS: Hoyt Koch, MD is PCP   Laymond Purser, MD is Cardiologist with [facility]: Labs reviewed: Acceptable for surgery. (all labs ordered are listed, but only abnormal results are displayed)  Labs Reviewed  CBC WITH DIFFERENTIAL/PLATELET - Abnormal; Notable for the following components:      Result Value   WBC 16.0 (*)    Hemoglobin 11.6 (*)    RDW 17.7 (*)    Lymphs Abs 7.2 (*)    Monocytes Absolute 1.5 (*)    All other components within normal limits  COMPREHENSIVE METABOLIC PANEL - Abnormal; Notable for the following components:   BUN 21 (*)    Albumin 3.4 (*)    All other components within normal limits  HEMOGLOBIN A1C  GLUCOSE, CAPILLARY  TYPE AND SCREEN     IMAGES:   EKG: 11/29/2021 Rate 66 bpm Normal sinus rhythm Minimal voltage criteria for LVH, may be normal variant ( R in aVL ) Cannot rule out Anterior infarct , age  undetermined Abnormal ECG  CV: Echo 05/22/2021 Normal left ventricular dimensions with hyperdynamic function Normal right ventricular dimensions and function No major valvular abnormalities noted on this suboptimal study.  Past Medical History:  Diagnosis Date   Abscess    Arthritis    Atrial fibrillation ([place])    Cardiac arrhythmia due to congenital heart disease    trace MR 08/15/20 echo ([place])   CVID (common variable immunodeficiency) ([place]) 2015   Diverticulosis    Elevated cholesterol    GERD (gastroesophageal reflux disease)    High blood pressure    Non Hodgkin's lymphoma ([place]) 1998   Obesity    Personal history of gestational diabetes    PMR (polymyalgia rheumatica) ([facility])    Pneumonia    PONV (postoperative nausea and vomiting)    POTS (postural orthostatic tachycardia syndrome)    Pre-diabetes    UTI (urinary tract infection)     Past Surgical History:  Procedure Laterality Date   ABDOMINAL HYSTERECTOMY     APPENDECTOMY     CESAREAN SECTION     GALLBLADDER SURGERY     JOINT REPLACEMENT Left    hip   JOINT REPLACEMENT Right    Right Hip 2022   left shoulder repair     LUMBAR LAMINECTOMY N/A 09/18/2021   Procedure: RIGHT LUMBAR FOUR-FIVE MICRODISCECTOMY;  Surgeon: Rodell Perna  C, MD;  Location: [place];  Service: Orthopedics;  Laterality: N/A;   LUNG REMOVAL, PARTIAL Right 2009   VATS, wedge resection partial lobectomy, done at [facility]   myringostomy Bilateral    SPLENECTOMY, TOTAL  1998   TONSILLECTOMY AND ADENOIDECTOMY      MEDICATIONS:  acetaminophen (TYLENOL) 325 MG tablet   albuterol (VENTOLIN HFA) 108 (90 Base) MCG/ACT inhaler   cetirizine (ZYRTEC) 10 MG tablet   Cholecalciferol (VITAMIN D3) 1.25 MG (50000 UT) CAPS   diphenhydrAMINE (BENADRYL) 25 MG tablet   doxycycline (VIBRAMYCIN) 100 MG capsule   DULoxetine (CYMBALTA) 60 MG capsule   famotidine (PEPCID) 20 MG tablet   flecainide (TAMBOCOR) 100 MG tablet   fluticasone  (FLONASE) 50 MCG/ACT nasal spray   folic acid (FOLVITE) 1 MG tablet   ibuprofen (ADVIL) 600 MG tablet   Immune Globulin, Human, 4 GM/20ML SOLN   methocarbamol (ROBAXIN) 500 MG tablet   methotrexate (RHEUMATREX) 2.5 MG tablet   montelukast (SINGULAIR) 10 MG tablet   Multiple Vitamin (MULTIVITAMIN WITH MINERALS) TABS tablet   nebivolol (BYSTOLIC) 5 MG tablet   nebivolol (BYSTOLIC) 5 MG tablet   NON FORMULARY   predniSONE (DELTASONE) 10 MG tablet   rivaroxaban (XARELTO) 20 MG TABS tablet   rivaroxaban (XARELTO) 20 MG TABS tablet   rivaroxaban (XARELTO) 20 MG TABS tablet   zolpidem (AMBIEN) 5 MG tablet   No current facility-administered medications for this encounter.    Konrad Felix Ward, PA-C [facility] [phone]

## 2022-04-25 NOTE — Anesthesia Preprocedure Evaluation (Signed)
Anesthesia Evaluation  Patient identified by MRN, date of birth, ID band Patient awake    Reviewed: Allergy & Precautions, NPO status , Patient's Chart, lab work & pertinent test results  History of Anesthesia Complications (+) PONV and history of anesthetic complications  Airway Mallampati: III  TM Distance: >3 FB Neck ROM: Full    Dental no notable dental hx.    Pulmonary sleep apnea and Continuous Positive Airway Pressure Ventilation , pneumonia, resolved, former smoker,    Pulmonary exam normal        Cardiovascular hypertension, Pt. on home beta blockers Normal cardiovascular exam+ dysrhythmias Atrial Fibrillation      Neuro/Psych PSYCHIATRIC DISORDERS    GI/Hepatic Neg liver ROS, GERD  Medicated and Controlled,  Endo/Other  Morbid obesity  Renal/GU negative Renal ROS     Musculoskeletal  (+) Arthritis ,   Abdominal (+) + obese,   Peds  Hematology  (+) Blood dyscrasia, ,   Anesthesia Other Findings MORBID OBESITY  Reproductive/Obstetrics                            Anesthesia Physical Anesthesia Plan  ASA: 3  Anesthesia Plan: General   Post-op Pain Management:    Induction: Intravenous  PONV Risk Score and Plan: 4 or greater and Ondansetron, Dexamethasone, Midazolam, Scopolamine patch - Pre-op and Treatment may vary due to age or medical condition  Airway Management Planned: Oral ETT  Additional Equipment:   Intra-op Plan:   Post-operative Plan: Extubation in OR  Informed Consent: I have reviewed the patients History and Physical, chart, labs and discussed the procedure including the risks, benefits and alternatives for the proposed anesthesia with the patient or authorized representative who has indicated his/her understanding and acceptance.     Dental advisory given  Plan Discussed with: CRNA  Anesthesia Plan Comments: (Reviewed PAT note 04/16/2022)        Anesthesia Quick Evaluation

## 2022-04-28 ENCOUNTER — Other Ambulatory Visit (HOSPITAL_COMMUNITY): Payer: Self-pay

## 2022-04-29 ENCOUNTER — Inpatient Hospital Stay (HOSPITAL_COMMUNITY): Payer: 59 | Admitting: Physician Assistant

## 2022-04-29 ENCOUNTER — Other Ambulatory Visit: Payer: Self-pay

## 2022-04-29 ENCOUNTER — Inpatient Hospital Stay (HOSPITAL_COMMUNITY): Payer: 59 | Admitting: Certified Registered Nurse Anesthetist

## 2022-04-29 ENCOUNTER — Encounter (HOSPITAL_COMMUNITY)
Admission: RE | Disposition: A | Discharge status: Home / Self Care | Payer: Self-pay | Source: Home / Self Care | Attending: General Surgery

## 2022-04-29 ENCOUNTER — Other Ambulatory Visit (HOSPITAL_COMMUNITY): Payer: Self-pay

## 2022-04-29 ENCOUNTER — Encounter (HOSPITAL_COMMUNITY): Payer: Self-pay | Admitting: General Surgery

## 2022-04-29 ENCOUNTER — Inpatient Hospital Stay (HOSPITAL_COMMUNITY)
Admission: RE | Admit: 2022-04-29 | Discharge: 2022-05-01 | DRG: 621 | Disposition: A | Discharge status: Home / Self Care | Payer: 59 | Attending: General Surgery | Admitting: General Surgery

## 2022-04-29 DIAGNOSIS — Z6841 Body Mass Index (BMI) 40.0 and over, adult: Secondary | ICD-10-CM

## 2022-04-29 DIAGNOSIS — Z87891 Personal history of nicotine dependence: Secondary | ICD-10-CM

## 2022-04-29 DIAGNOSIS — Z808 Family history of malignant neoplasm of other organs or systems: Secondary | ICD-10-CM | POA: Diagnosis not present

## 2022-04-29 DIAGNOSIS — Z96641 Presence of right artificial hip joint: Secondary | ICD-10-CM | POA: Diagnosis present

## 2022-04-29 DIAGNOSIS — Z90710 Acquired absence of both cervix and uterus: Secondary | ICD-10-CM

## 2022-04-29 DIAGNOSIS — G4733 Obstructive sleep apnea (adult) (pediatric): Secondary | ICD-10-CM | POA: Diagnosis present

## 2022-04-29 DIAGNOSIS — Z9081 Acquired absence of spleen: Secondary | ICD-10-CM

## 2022-04-29 DIAGNOSIS — I10 Essential (primary) hypertension: Secondary | ICD-10-CM | POA: Diagnosis present

## 2022-04-29 DIAGNOSIS — Z888 Allergy status to other drugs, medicaments and biological substances status: Secondary | ICD-10-CM | POA: Diagnosis not present

## 2022-04-29 DIAGNOSIS — K219 Gastro-esophageal reflux disease without esophagitis: Secondary | ICD-10-CM | POA: Diagnosis present

## 2022-04-29 DIAGNOSIS — K660 Peritoneal adhesions (postprocedural) (postinfection): Secondary | ICD-10-CM | POA: Diagnosis not present

## 2022-04-29 DIAGNOSIS — Z887 Allergy status to serum and vaccine status: Secondary | ICD-10-CM | POA: Diagnosis not present

## 2022-04-29 DIAGNOSIS — Z800 Family history of malignant neoplasm of digestive organs: Secondary | ICD-10-CM | POA: Diagnosis not present

## 2022-04-29 DIAGNOSIS — I480 Paroxysmal atrial fibrillation: Secondary | ICD-10-CM | POA: Diagnosis not present

## 2022-04-29 DIAGNOSIS — R7303 Prediabetes: Secondary | ICD-10-CM

## 2022-04-29 DIAGNOSIS — D214 Benign neoplasm of connective and other soft tissue of abdomen: Secondary | ICD-10-CM | POA: Diagnosis not present

## 2022-04-29 HISTORY — PX: UPPER GI ENDOSCOPY: SHX6162

## 2022-04-29 HISTORY — PX: LAPAROSCOPIC GASTRIC SLEEVE RESECTION: SHX5895

## 2022-04-29 LAB — CBC
HCT: 42.8 % (ref 36.0–46.0)
Hemoglobin: 12.8 g/dL (ref 12.0–15.0)
MCH: 29.2 pg (ref 26.0–34.0)
MCHC: 29.9 g/dL — ABNORMAL LOW (ref 30.0–36.0)
MCV: 97.7 fL (ref 80.0–100.0)
Platelets: 403 10*3/uL — ABNORMAL HIGH (ref 150–400)
RBC: 4.38 MIL/uL (ref 3.87–5.11)
RDW: 17.3 % — ABNORMAL HIGH (ref 11.5–15.5)
WBC: 23.2 10*3/uL — ABNORMAL HIGH (ref 4.0–10.5)
nRBC: 0.2 % (ref 0.0–0.2)

## 2022-04-29 LAB — GLUCOSE, CAPILLARY: Glucose-Capillary: 93 mg/dL (ref 70–99)

## 2022-04-29 LAB — CREATININE, SERUM
Creatinine, Ser: 0.87 mg/dL (ref 0.44–1.00)
GFR, Estimated: >60 mL/min (ref >60)

## 2022-04-29 LAB — HEMOGLOBIN AND HEMATOCRIT, BLOOD
HCT: 37.8 % (ref 36.0–46.0)
Hemoglobin: 11.8 g/dL — ABNORMAL LOW (ref 12.0–15.0)

## 2022-04-29 LAB — ABO/RH: ABO/RH(D): A POS

## 2022-04-29 SURGERY — GASTRECTOMY, SLEEVE, ROBOT-ASSISTED
Anesthesia: General

## 2022-04-29 MED ORDER — AMISULPRIDE (ANTIEMETIC) 5 MG/2ML IV SOLN
INTRAVENOUS | Status: AC
  Filled 2022-04-29: qty 4

## 2022-04-29 MED ORDER — SUCCINYLCHOLINE CHLORIDE 200 MG/10ML IV SOSY
PREFILLED_SYRINGE | INTRAVENOUS | Status: AC
  Filled 2022-04-29: qty 10

## 2022-04-29 MED ORDER — DEXAMETHASONE SODIUM PHOSPHATE 10 MG/ML IJ SOLN
INTRAMUSCULAR | Status: DC | PRN
  Administered 2022-04-29: 6 mg via INTRAVENOUS

## 2022-04-29 MED ORDER — PHENYLEPHRINE 80 MCG/ML (10ML) SYRINGE FOR IV PUSH (FOR BLOOD PRESSURE SUPPORT)
PREFILLED_SYRINGE | INTRAVENOUS | Status: DC | PRN
  Administered 2022-04-29 (×4): 80 ug via INTRAVENOUS

## 2022-04-29 MED ORDER — OXYCODONE HCL 5 MG PO TABS: 5.0000 mg | ORAL_TABLET | Freq: Once | ORAL | Status: DC | PRN

## 2022-04-29 MED ORDER — BUPIVACAINE LIPOSOME 1.3 % IJ SUSP: 20.0000 mL | Freq: Once | INTRAMUSCULAR | Status: DC

## 2022-04-29 MED ORDER — ONDANSETRON HCL 4 MG/2ML IJ SOLN
INTRAMUSCULAR | Status: AC
  Filled 2022-04-29: qty 2

## 2022-04-29 MED ORDER — MORPHINE SULFATE (PF) 2 MG/ML IV SOLN
1.0000 mg | INTRAVENOUS | Status: DC | PRN
  Administered 2022-04-29: 2 mg via INTRAVENOUS
  Filled 2022-04-29: qty 1

## 2022-04-29 MED ORDER — FENTANYL CITRATE (PF) 100 MCG/2ML IJ SOLN
INTRAMUSCULAR | Status: AC
  Filled 2022-04-29: qty 2

## 2022-04-29 MED ORDER — ROCURONIUM BROMIDE 10 MG/ML (PF) SYRINGE
PREFILLED_SYRINGE | INTRAVENOUS | Status: AC
  Filled 2022-04-29: qty 10

## 2022-04-29 MED ORDER — LIDOCAINE HCL (PF) 2 % IJ SOLN
INTRAMUSCULAR | Status: DC | PRN
  Administered 2022-04-29: 1.5 mg/kg/h via INTRADERMAL

## 2022-04-29 MED ORDER — CHLORHEXIDINE GLUCONATE 0.12 % MT SOLN
15.0000 mL | Freq: Once | OROMUCOSAL | Status: AC
  Administered 2022-04-29: 15 mL via OROMUCOSAL

## 2022-04-29 MED ORDER — HYDRALAZINE HCL 20 MG/ML IJ SOLN: 10.0000 mg | INTRAMUSCULAR | Status: DC | PRN

## 2022-04-29 MED ORDER — ROCURONIUM BROMIDE 100 MG/10ML IV SOLN
INTRAVENOUS | Status: DC | PRN
  Administered 2022-04-29: 20 mg via INTRAVENOUS
  Administered 2022-04-29: 60 mg via INTRAVENOUS
  Administered 2022-04-29 (×2): 20 mg via INTRAVENOUS

## 2022-04-29 MED ORDER — FAMOTIDINE IN NACL 20-0.9 MG/50ML-% IV SOLN
20.0000 mg | Freq: Two times a day (BID) | INTRAVENOUS | Status: DC
Start: 2022-04-29 — End: 2022-04-30
  Administered 2022-04-29: 20 mg via INTRAVENOUS
  Filled 2022-04-29 (×2): qty 50

## 2022-04-29 MED ORDER — LIDOCAINE HCL (CARDIAC) PF 100 MG/5ML IV SOSY: PREFILLED_SYRINGE | INTRAVENOUS | Status: DC | PRN

## 2022-04-29 MED ORDER — LACTATED RINGERS IR SOLN
Status: DC | PRN
  Administered 2022-04-29: 1000 mL

## 2022-04-29 MED ORDER — SCOPOLAMINE 1 MG/3DAYS TD PT72
1.0000 | MEDICATED_PATCH | TRANSDERMAL | Status: DC
  Administered 2022-04-29: 1.5 mg via TRANSDERMAL
  Filled 2022-04-29: qty 1

## 2022-04-29 MED ORDER — CHLORHEXIDINE GLUCONATE CLOTH 2 % EX PADS: 6.0000 | MEDICATED_PAD | Freq: Once | CUTANEOUS | Status: DC

## 2022-04-29 MED ORDER — ACETAMINOPHEN 500 MG PO TABS
1000.0000 mg | ORAL_TABLET | ORAL | Status: AC
  Administered 2022-04-29: 1000 mg via ORAL
  Filled 2022-04-29: qty 2

## 2022-04-29 MED ORDER — BUPIVACAINE-EPINEPHRINE 0.25% -1:200000 IJ SOLN
INTRAMUSCULAR | Status: DC | PRN
  Administered 2022-04-29: 50 mL

## 2022-04-29 MED ORDER — ORAL CARE MOUTH RINSE: 15.0000 mL | Freq: Once | OROMUCOSAL | Status: AC

## 2022-04-29 MED ORDER — ENOXAPARIN SODIUM 30 MG/0.3ML IJ SOSY
30.0000 mg | PREFILLED_SYRINGE | Freq: Two times a day (BID) | INTRAMUSCULAR | Status: DC
  Administered 2022-04-29 – 2022-05-01 (×4): 30 mg via SUBCUTANEOUS
  Filled 2022-04-29 (×4): qty 0.3

## 2022-04-29 MED ORDER — ACETAMINOPHEN 500 MG PO TABS
1000.0000 mg | ORAL_TABLET | Freq: Three times a day (TID) | ORAL | Status: DC
  Administered 2022-04-29 – 2022-05-01 (×4): 1000 mg via ORAL
  Filled 2022-04-29 (×4): qty 2

## 2022-04-29 MED ORDER — EPHEDRINE 5 MG/ML INJ
INTRAVENOUS | Status: AC
  Filled 2022-04-29: qty 10

## 2022-04-29 MED ORDER — EPHEDRINE SULFATE-NACL 50-0.9 MG/10ML-% IV SOSY
PREFILLED_SYRINGE | INTRAVENOUS | Status: DC | PRN
  Administered 2022-04-29: 5 mg via INTRAVENOUS
  Administered 2022-04-29 (×2): 10 mg via INTRAVENOUS

## 2022-04-29 MED ORDER — PROPOFOL 10 MG/ML IV BOLUS
INTRAVENOUS | Status: DC | PRN
  Administered 2022-04-29: 150 mg via INTRAVENOUS

## 2022-04-29 MED ORDER — FENTANYL CITRATE (PF) 100 MCG/2ML IJ SOLN
INTRAMUSCULAR | Status: DC | PRN
  Administered 2022-04-29 (×2): 50 ug via INTRAVENOUS

## 2022-04-29 MED ORDER — SODIUM CHLORIDE 0.9 % IV SOLN
2.0000 g | INTRAVENOUS | Status: AC
  Administered 2022-04-29: 2 g via INTRAVENOUS
  Filled 2022-04-29: qty 2

## 2022-04-29 MED ORDER — PHENYLEPHRINE HCL-NACL 20-0.9 MG/250ML-% IV SOLN
INTRAVENOUS | Status: DC | PRN
  Administered 2022-04-29: 30 ug/min via INTRAVENOUS

## 2022-04-29 MED ORDER — PHENYLEPHRINE HCL (PRESSORS) 10 MG/ML IV SOLN
INTRAVENOUS | Status: AC
  Filled 2022-04-29: qty 1

## 2022-04-29 MED ORDER — DEXAMETHASONE SODIUM PHOSPHATE 4 MG/ML IJ SOLN: 4.0000 mg | INTRAMUSCULAR | Status: DC

## 2022-04-29 MED ORDER — DEXTROSE-NACL 5-0.45 % IV SOLN: INTRAVENOUS | Status: DC

## 2022-04-29 MED ORDER — FLUCONAZOLE 100 MG PO TABS
200.0000 mg | ORAL_TABLET | Freq: Every day | ORAL | Status: DC
  Administered 2022-04-29 – 2022-05-01 (×3): 200 mg via ORAL
  Filled 2022-04-29 (×3): qty 2

## 2022-04-29 MED ORDER — LIDOCAINE 2% (20 MG/ML) 5 ML SYRINGE
INTRAMUSCULAR | Status: DC | PRN
  Administered 2022-04-29: 60 mg via INTRAVENOUS

## 2022-04-29 MED ORDER — AMISULPRIDE (ANTIEMETIC) 5 MG/2ML IV SOLN
10.0000 mg | Freq: Once | INTRAVENOUS | Status: AC | PRN
  Administered 2022-04-29: 10 mg via INTRAVENOUS

## 2022-04-29 MED ORDER — GLYCOPYRROLATE 0.2 MG/ML IJ SOLN
INTRAMUSCULAR | Status: DC | PRN
  Administered 2022-04-29: .2 mg via INTRAVENOUS

## 2022-04-29 MED ORDER — SUGAMMADEX SODIUM 200 MG/2ML IV SOLN
INTRAVENOUS | Status: DC | PRN
  Administered 2022-04-29: 400 mg via INTRAVENOUS

## 2022-04-29 MED ORDER — FENTANYL CITRATE PF 50 MCG/ML IJ SOSY
PREFILLED_SYRINGE | INTRAMUSCULAR | Status: AC
  Filled 2022-04-29: qty 2

## 2022-04-29 MED ORDER — HEPARIN SODIUM (PORCINE) 5000 UNIT/ML IJ SOLN
5000.0000 [IU] | INTRAMUSCULAR | Status: AC
  Administered 2022-04-29: 5000 [IU] via SUBCUTANEOUS
  Filled 2022-04-29: qty 1

## 2022-04-29 MED ORDER — BUPIVACAINE HCL 0.25 % IJ SOLN
INTRAMUSCULAR | Status: AC
  Filled 2022-04-29: qty 1

## 2022-04-29 MED ORDER — SODIUM CHLORIDE 0.9 % IR SOLN
Status: DC | PRN
  Administered 2022-04-29: 1000 mL

## 2022-04-29 MED ORDER — MIDAZOLAM HCL 5 MG/5ML IJ SOLN
INTRAMUSCULAR | Status: DC | PRN
  Administered 2022-04-29: 2 mg via INTRAVENOUS

## 2022-04-29 MED ORDER — ONDANSETRON HCL 4 MG/2ML IJ SOLN: 4.0000 mg | INTRAMUSCULAR | Status: DC | PRN

## 2022-04-29 MED ORDER — OXYCODONE HCL 5 MG/5ML PO SOLN: 5.0000 mg | Freq: Once | ORAL | Status: DC | PRN

## 2022-04-29 MED ORDER — LACTATED RINGERS IV SOLN: INTRAVENOUS | Status: DC

## 2022-04-29 MED ORDER — FENTANYL CITRATE PF 50 MCG/ML IJ SOSY
25.0000 ug | PREFILLED_SYRINGE | INTRAMUSCULAR | Status: DC | PRN
  Administered 2022-04-29: 25 ug via INTRAVENOUS

## 2022-04-29 MED ORDER — ONDANSETRON HCL 4 MG/2ML IJ SOLN
INTRAMUSCULAR | Status: DC | PRN
  Administered 2022-04-29: 4 mg via INTRAVENOUS

## 2022-04-29 MED ORDER — BUPIVACAINE LIPOSOME 1.3 % IJ SUSP
INTRAMUSCULAR | Status: AC
  Filled 2022-04-29: qty 20

## 2022-04-29 MED ORDER — APREPITANT 40 MG PO CAPS
40.0000 mg | ORAL_CAPSULE | ORAL | Status: AC
  Administered 2022-04-29: 40 mg via ORAL
  Filled 2022-04-29: qty 1

## 2022-04-29 MED ORDER — BUPIVACAINE LIPOSOME 1.3 % IJ SUSP
INTRAMUSCULAR | Status: DC | PRN
  Administered 2022-04-29: 20 mL

## 2022-04-29 MED ORDER — OXYCODONE HCL 5 MG/5ML PO SOLN
5.0000 mg | Freq: Four times a day (QID) | ORAL | Status: DC | PRN
  Administered 2022-04-29 – 2022-05-01 (×6): 5 mg via ORAL
  Filled 2022-04-29 (×6): qty 5

## 2022-04-29 MED ORDER — MIDAZOLAM HCL 2 MG/2ML IJ SOLN
INTRAMUSCULAR | Status: AC
  Filled 2022-04-29: qty 2

## 2022-04-29 MED ORDER — DEXAMETHASONE SODIUM PHOSPHATE 10 MG/ML IJ SOLN
INTRAMUSCULAR | Status: AC
  Filled 2022-04-29: qty 1

## 2022-04-29 MED ORDER — SIMETHICONE 80 MG PO CHEW
80.0000 mg | CHEWABLE_TABLET | Freq: Four times a day (QID) | ORAL | Status: DC | PRN
  Administered 2022-04-30: 80 mg via ORAL
  Filled 2022-04-29 (×3): qty 1

## 2022-04-29 MED ORDER — ENSURE MAX PROTEIN PO LIQD
2.0000 [oz_av] | ORAL | Status: DC
  Administered 2022-04-30 – 2022-05-01 (×14): 2 [oz_av] via ORAL

## 2022-04-29 MED ORDER — ACETAMINOPHEN 160 MG/5ML PO SOLN
1000.0000 mg | Freq: Three times a day (TID) | ORAL | Status: DC
  Administered 2022-04-30: 1000 mg via ORAL
  Filled 2022-04-29: qty 40.6

## 2022-04-29 SURGICAL SUPPLY — 85 items
APPLIER CLIP 5 13 M/L LIGAMAX5 (MISCELLANEOUS)
APPLIER CLIP ROT 10 11.4 M/L (STAPLE)
BAG COUNTER SPONGE SURGICOUNT (BAG) ×2 IMPLANT
BAG LAPAROSCOPIC 12 15 PORT 16 (BASKET) IMPLANT
BAG RETRIEVAL 12/15 (BASKET) ×2
BENZOIN TINCTURE PRP APPL 2/3 (GAUZE/BANDAGES/DRESSINGS) ×2 IMPLANT
BLADE SURG SZ11 CARB STEEL (BLADE) ×2 IMPLANT
BNDG ADH 1X3 SHEER STRL LF (GAUZE/BANDAGES/DRESSINGS) ×12 IMPLANT
CANNULA REDUC XI 12-8 STAPL (CANNULA) ×1
CANNULA REDUCER 12-8 DVNC XI (CANNULA) ×1 IMPLANT
CHLORAPREP W/TINT 26 (MISCELLANEOUS) ×2 IMPLANT
CLIP APPLIE 5 13 M/L LIGAMAX5 (MISCELLANEOUS) IMPLANT
CLIP APPLIE ROT 10 11.4 M/L (STAPLE) IMPLANT
COVER SURGICAL LIGHT HANDLE (MISCELLANEOUS) ×2 IMPLANT
DERMABOND ADVANCED (GAUZE/BANDAGES/DRESSINGS)
DERMABOND ADVANCED .7 DNX12 (GAUZE/BANDAGES/DRESSINGS) IMPLANT
DRAPE ARM DVNC X/XI (DISPOSABLE) ×4 IMPLANT
DRAPE COLUMN DVNC XI (DISPOSABLE) ×1 IMPLANT
DRAPE CV SPLIT W-CLR ANES SCRN (DRAPES) ×2 IMPLANT
DRAPE DA VINCI XI ARM (DISPOSABLE) ×4
DRAPE DA VINCI XI COLUMN (DISPOSABLE) ×1
DRAPE ORTHO SPLIT 77X108 STRL (DRAPES) ×1
DRAPE SURG ORHT 6 SPLT 77X108 (DRAPES) ×1 IMPLANT
ELECT REM PT RETURN 15FT ADLT (MISCELLANEOUS) ×2 IMPLANT
GAUZE 4X4 16PLY ~~LOC~~+RFID DBL (SPONGE) ×2 IMPLANT
GLOVE BIOGEL PI IND STRL 7.0 (GLOVE) ×2 IMPLANT
GLOVE BIOGEL PI INDICATOR 7.0 (GLOVE) ×2
GLOVE SURG SS PI 7.0 STRL IVOR (GLOVE) ×4 IMPLANT
GOWN STRL REUS W/ TWL LRG LVL3 (GOWN DISPOSABLE) ×2 IMPLANT
GOWN STRL REUS W/ TWL XL LVL3 (GOWN DISPOSABLE) IMPLANT
GOWN STRL REUS W/TWL LRG LVL3 (GOWN DISPOSABLE) ×2
GOWN STRL REUS W/TWL XL LVL3 (GOWN DISPOSABLE)
GRASPER SUT TROCAR 14GX15 (MISCELLANEOUS) ×2 IMPLANT
IRRIG SUCT STRYKERFLOW 2 WTIP (MISCELLANEOUS) ×2
IRRIGATION SUCT STRKRFLW 2 WTP (MISCELLANEOUS) ×1 IMPLANT
KIT BASIN OR (CUSTOM PROCEDURE TRAY) ×2 IMPLANT
KIT TURNOVER KIT A (KITS) ×1 IMPLANT
MARKER SKIN DUAL TIP RULER LAB (MISCELLANEOUS) ×2 IMPLANT
MAT PREVALON FULL STRYKER (MISCELLANEOUS) ×2 IMPLANT
NDL SPNL 22GX3.5 QUINCKE BK (NEEDLE) ×1 IMPLANT
NEEDLE SPNL 22GX3.5 QUINCKE BK (NEEDLE) ×2 IMPLANT
PACK CARDIOVASCULAR III (CUSTOM PROCEDURE TRAY) ×2 IMPLANT
POUCH RETRIEVAL ECOSAC 10 (ENDOMECHANICALS) IMPLANT
POUCH RETRIEVAL ECOSAC 10MM (ENDOMECHANICALS)
RELOAD STAPLE 60 2.5 WHT DVNC (STAPLE) ×1 IMPLANT
RELOAD STAPLE 60 3.5 BLU DVNC (STAPLE) ×1 IMPLANT
RELOAD STAPLE 60 4.3 GRN DVNC (STAPLE) IMPLANT
RELOAD STAPLER 2.5X60 WHT DVNC (STAPLE) ×5 IMPLANT
RELOAD STAPLER 3.5X60 BLU DVNC (STAPLE) ×1 IMPLANT
RELOAD STAPLER 4.3X60 GRN DVNC (STAPLE) IMPLANT
SCISSORS LAP 5X35 DISP (ENDOMECHANICALS) IMPLANT
SCISSORS LAP 5X45 EPIX DISP (ENDOMECHANICALS) ×1 IMPLANT
SEAL CANN UNIV 5-8 DVNC XI (MISCELLANEOUS) ×3 IMPLANT
SEAL XI 5MM-8MM UNIVERSAL (MISCELLANEOUS) ×3
SEALER VESSEL DA VINCI XI (MISCELLANEOUS) ×1
SEALER VESSEL EXT DVNC XI (MISCELLANEOUS) ×1 IMPLANT
SHEARS HARMONIC ACE PLUS 36CM (ENDOMECHANICALS) ×1 IMPLANT
SLEEVE GASTRECTOMY 40FR VISIGI (MISCELLANEOUS) ×2 IMPLANT
SLEEVE Z-THREAD 5X100MM (TROCAR) ×1 IMPLANT
SOL ANTI FOG 6CC (MISCELLANEOUS) ×1 IMPLANT
SOLUTION ANTI FOG 6CC (MISCELLANEOUS) ×1
SOLUTION ELECTROLUBE (MISCELLANEOUS) ×2 IMPLANT
SPIKE FLUID TRANSFER (MISCELLANEOUS) ×2 IMPLANT
STAPLER 60 DA VINCI SURE FORM (STAPLE) ×1
STAPLER 60 SUREFORM DVNC (STAPLE) ×1 IMPLANT
STAPLER CANNULA SEAL DVNC XI (STAPLE) ×1 IMPLANT
STAPLER CANNULA SEAL XI (STAPLE) ×1
STAPLER RELOAD 2.5X60 WHITE (STAPLE) ×5
STAPLER RELOAD 2.5X60 WHT DVNC (STAPLE) ×5
STAPLER RELOAD 3.5X60 BLU DVNC (STAPLE) ×1
STAPLER RELOAD 3.5X60 BLUE (STAPLE) ×1
STAPLER RELOAD 4.3X60 GREEN (STAPLE)
STAPLER RELOAD 4.3X60 GRN DVNC (STAPLE)
STRIP CLOSURE SKIN 1/2X4 (GAUZE/BANDAGES/DRESSINGS) ×2 IMPLANT
SUT ETHIBOND 0 36 GRN (SUTURE) IMPLANT
SUT MNCRL AB 4-0 PS2 18 (SUTURE) ×2 IMPLANT
SUT VIC AB 2-0 SH 27 (SUTURE)
SUT VIC AB 2-0 SH 27X BRD (SUTURE) IMPLANT
SUT VICRYL 0 TIES 12 18 (SUTURE) ×2 IMPLANT
SYR 20ML LL LF (SYRINGE) ×4 IMPLANT
TOWEL OR 17X26 10 PK STRL BLUE (TOWEL DISPOSABLE) ×2 IMPLANT
TOWEL OR NON WOVEN STRL DISP B (DISPOSABLE) ×2 IMPLANT
TRAY FOLEY MTR SLVR 16FR STAT (SET/KITS/TRAYS/PACK) IMPLANT
TROCAR Z-THREAD OPTICAL 5X100M (TROCAR) ×2 IMPLANT
TUBING INSUFFLATION 10FT LAP (TUBING) ×2 IMPLANT

## 2022-04-29 NOTE — Progress Notes (Signed)
PHARMACY CONSULT FOR:  Risk Assessment for Post-Discharge VTE Following Bariatric Surgery  Post-Discharge VTE Risk Assessment: This patient's probability of 30-day post-discharge VTE is increased due to the factors marked: x Sleeve gastrectomy   Liver disorder (transplant, cirrhosis, or nonalcoholic steatohepatitis)   Hx of VTE   Hemorrhage requiring transfusion   GI perforation, leak, or obstruction   ====================================================    Female    Age >/=60 years  x  BMI >/=50 kg/m2    CHF    Dyspnea at Rest    Paraplegia  x  Non-gastric-band surgery    Operation Time >/=3 hr    Return to OR     Length of Stay >/= 3 d   Hypercoagulable condition   Significant venous stasis    Predicted probability of 30-day post-discharge VTE: n/a. Pt is chronically anticoagulated with rivaroxaban for atrial fibrillation.   Recommendation for Discharge:  Resume full-dose anticoagulation as soon as medically safe per surgery MD assessment.    Given limited published data on reliability of DOAC absorption in this setting, will await MD decision on whether to use enoxaparin vs resume DOAC. If decision made to bridge with heparin prior to discharge, please consult pharmacy.   Monique Clark is a 57 y.o. female who underwent sleeve gastrectomy on 8/1.   Case start: 1322 Case end: 1532   Allergies  Allergen Reactions   Tetanus-Diphtheria Toxoids Td Swelling    angioedema   Nizatidine Hives    Axid   Promethazine Other (See Comments)    High doses causes confusion   Tetanus Toxoids Other (See Comments)    angioedema    Patient Measurements: Height: 5' 4.5" (163.8 cm) Weight: (!) 145.2 kg (320 lb 1.6 oz) IBW/kg (Calculated) : 55.85 Body mass index is 54.1 kg/m.  Recent Labs    04/29/22 1743  WBC 23.2*  HGB 12.8  HCT 42.8  PLT 403*   Estimated Creatinine Clearance: 110.8 mL/min (by C-G formula based on SCr of 0.82 mg/dL).    Past Medical History:   Diagnosis Date   Abscess    Arthritis    Atrial fibrillation ([place])    Cardiac arrhythmia due to congenital heart disease    trace MR 08/15/20 echo ([place])   CVID (common variable immunodeficiency) ([place]) 2015   Diverticulosis    Elevated cholesterol    GERD (gastroesophageal reflux disease)    High blood pressure    Non Hodgkin's lymphoma ([place]) 1998   Obesity    Personal history of gestational diabetes    PMR (polymyalgia rheumatica) (HCC)    Pneumonia    PONV (postoperative nausea and vomiting)    POTS (postural orthostatic tachycardia syndrome)    Pre-diabetes    UTI (urinary tract infection)      Medications Prior to Admission  Medication Sig Dispense Refill Last Dose   acetaminophen (TYLENOL) 325 MG tablet Take 650 mg by mouth every 6 (six) hours as needed for moderate pain.   Past Week   albuterol (VENTOLIN HFA) 108 (90 Base) MCG/ACT inhaler Inhale 2 puffs into the lungs every 6 (six) hours as needed for shortness of breath or wheezing.      cetirizine (ZYRTEC) 10 MG tablet Take 1 tablet (10 mg total) by mouth daily. (Patient taking differently: Take 10 mg by mouth at bedtime.) 30 tablet 0    Cholecalciferol (VITAMIN D3) 1.25 MG (50000 UT) CAPS TAKE 1 CAPSULE BY MOUTH TWICE A WEEK AS DIRECTED 8 capsule 5    diphenhydrAMINE (BENADRYL) 25  MG tablet Take 25-50 mg by mouth See admin instructions. Take 25 mg by mouth as needed for allergies or itching and take 50 mg by mouth with Hizentra infusion      DULoxetine (CYMBALTA) 60 MG capsule Take 1 capsule (60 mg total) by mouth daily. 90 capsule 3 04/29/2022 at 0800   famotidine (PEPCID) 20 MG tablet Take 20 mg by mouth every evening.   04/28/2022 at 2100   flecainide (TAMBOCOR) 100 MG tablet Take 1 tablet by mouth every 12 hours 180 tablet 3 04/29/2022 at 0800   fluticasone (FLONASE) 50 MCG/ACT nasal spray Place 2 sprays into both nostrils daily as needed for allergies.   Past Week   folic acid (FOLVITE) 1 MG tablet Take 1  tablet (1 mg total) by mouth daily. 90 tablet 0 Past Week   ibuprofen (ADVIL) 600 MG tablet Take 600 mg by mouth every 6 (six) hours as needed for moderate pain.   Past Week   Immune Globulin, Human, 4 GM/20ML SOLN Inject 36 g into the skin every 14 (fourteen) days. Monday   04/20/2022   methocarbamol (ROBAXIN) 500 MG tablet Take 1 tablet (500 mg total) by mouth every 6 (six) hours as needed for muscle spasms. 60 tablet 0 04/28/2022 at 2100   methotrexate (RHEUMATREX) 2.5 MG tablet Take 6 tablets (15 mg total) by mouth once a week. Caution:Chemotherapy. Protect from light. 30 tablet 2 04/24/2022   montelukast (SINGULAIR) 10 MG tablet Take 1 tablet (10 mg total) by mouth at bedtime. 30 tablet 0 Past Month   Multiple Vitamin (MULTIVITAMIN WITH MINERALS) TABS tablet Take 1 tablet by mouth daily.   Past Month   nebivolol (BYSTOLIC) 5 MG tablet Take 1 tablet (5 mg total) by mouth daily. 30 tablet 4 04/29/2022 at 0800   NON FORMULARY Pt uses a cpap nightly   Past Week   predniSONE (DELTASONE) 10 MG tablet Take 1 tablet (10 mg total) by mouth daily with breakfast. 30 tablet 2 04/28/2022 at 0800   rivaroxaban (XARELTO) 20 MG TABS tablet Take 1 tablet by mouth every day 90 tablet 4 04/25/2022 at 2000   zolpidem (AMBIEN) 5 MG tablet Take 1 tablet by mouth at bedtime as needed for sleep. 30 tablet 5 04/28/2022 at 2100   doxycycline (VIBRAMYCIN) 100 MG capsule Take 1 capsule (100 mg total) by mouth 2 (two) times daily. (Patient not taking: Reported on 04/15/2022) 20 capsule 0 Not Taking   nebivolol (BYSTOLIC) 5 MG tablet Take 1 tablet by mouth every day (Patient not taking: Reported on 04/15/2022) 30 tablet 4 Not Taking   rivaroxaban (XARELTO) 20 MG TABS tablet TAKE 1 TABLET BY MOUTH DAILY (Patient not taking: Reported on 04/15/2022) 90 tablet 4 Not Taking   rivaroxaban (XARELTO) 20 MG TABS tablet Take 1 tablet by mouth daily with supper. (Patient not taking: Reported on 04/15/2022) 30 tablet 5 Not Taking    Lenis Noon, PharmD 04/29/2022,6:36 PM

## 2022-04-29 NOTE — Anesthesia Postprocedure Evaluation (Signed)
Anesthesia Post Note  Patient: Monique Clark  Procedure(s) Performed: XI ROBOT ASSISTED GASTRIC SLEEVE RESECTION UPPER GI ENDOSCOPY     Patient location during evaluation: PACU Anesthesia Type: General Level of consciousness: awake Pain management: pain level controlled Vital Signs Assessment: post-procedure vital signs reviewed and stable Respiratory status: spontaneous breathing, nonlabored ventilation, respiratory function stable and patient connected to nasal cannula oxygen Cardiovascular status: blood pressure returned to baseline and stable Postop Assessment: no apparent nausea or vomiting Anesthetic complications: no   No notable events documented.  Last Vitals:  Vitals:   04/29/22 1929 04/29/22 1942  BP:  131/80  Pulse: 67 62  Resp: 18 18  Temp:  36.6 C  SpO2: 98% (!) 89%    Last Pain:  Vitals:   04/29/22 1942  TempSrc: Oral  PainSc:                  Cecylia Brazill P Fayelynn Distel

## 2022-04-29 NOTE — Progress Notes (Signed)
Patient seen in Short Stay prior to surgery.  Discussed QI "Goals for Discharge" document with patient including ambulation in halls, Incentive Spirometry use every hour, and oral care.  Also discussed pain and nausea control.  BSTOP education provided including BSTOP information guide, "Guide for Pain Management after your Bariatric Procedure".  Diet progression education provided including "Bariatric Surgery Post-Op Food Plan Phase 1: Liquids".  Questions answered.  Will continue to partner with bedside RN and follow up with patient per protocol after surgery complete.

## 2022-04-29 NOTE — H&P (Signed)
Chief Complaint: Return Weight Loss (Pre-Op )  History of Present Illness: Monique Clark is a 56 y.o. female who is seen today for preop for sleeve.  She was diagnosed with OSA and is using her CPAP reasonably well.  Review of Systems: A complete review of systems was obtained from the patient. I have reviewed this information and discussed as appropriate with the patient. See HPI as well for other ROS.  Review of Systems  Constitutional: Negative.  HENT: Negative.  Eyes: Negative.  Respiratory: Negative.  Cardiovascular: Negative.  Gastrointestinal: Negative.  Genitourinary: Negative.  Musculoskeletal: Negative.  Skin: Negative.  Neurological: Negative.  Endo/Heme/Allergies: Negative.  Psychiatric/Behavioral: Negative.   Medical History: Past Medical History:  Diagnosis Date  Arrhythmia  Arthritis  History of cancer  Hypertension   There is no problem list on file for this patient.  Past Surgical History:  Procedure Laterality Date  ADENOIDECTOMY 1971  [place]  Tonsillectomy 2004  CESAREAN SECTION 2007  HYSTERECTOMY 2017  Right Hip Replacement 2019  Right Lumbar Four-Five Microdiscectomy 09/18/2021  Dr. Olga Millers    Allergies  Allergen Reactions  Tetanus Toxoid, Adsorbed Anaphylaxis  angioedema  Tetanus And Diphtheria Toxoids, Adsorbed, Adult Swelling  angioedema  Nizatidine Hives  Axid  Promethazine Other (See Comments)  High doses causes confusion   Current Outpatient Medications on File Prior to Visit  Medication Sig Dispense Refill  DULoxetine (CYMBALTA) 60 MG DR capsule Take by mouth  famotidine (PEPCID) 20 MG tablet Take 20 mg by mouth 2 (two) times daily  flecainide (TAMBOCOR) 100 MG tablet Take 1 tablet by mouth every 12 (twelve) hours  ibuprofen (MOTRIN) 600 MG tablet Take 600 mg by mouth every 6 (six) hours as needed  methocarbamoL (ROBAXIN) 500 MG tablet Take 500 mg by mouth 4 (four) times daily   nebivoloL (BYSTOLIC) 2.5 MG tablet Take 1 tablet by mouth once daily  rivaroxaban (XARELTO) 20 mg tablet Take 1 tablet by mouth once daily  rosuvastatin (CRESTOR) 10 MG tablet Take by mouth   No current facility-administered medications on file prior to visit.   Family History  Problem Relation Age of Onset  Skin cancer Mother  Hyperlipidemia (Elevated cholesterol) Mother  Colon cancer Father  Obesity Father  Colon cancer Sister    Social History   Tobacco Use  Smoking Status Former  Types: Cigarettes  Quit date: 2005  Years since quitting: 18.5  Smokeless Tobacco Never    Social History   Socioeconomic History  Marital status: Married  Tobacco Use  Smoking status: Former  Types: Cigarettes  Quit date: 2005  Years since quitting: 18.5  Smokeless tobacco: Never  Substance and Sexual Activity  Alcohol use: Never  Drug use: Never   Objective:   Vitals:  04/18/22 1003  BP: 138/80  Pulse: 76  Temp: 36.2 C (97.1 F)  SpO2: 98%  Weight: (!) 148.4 kg (327 lb 3.2 oz)  Height: 165.1 cm ('5\' 5"'$ )   Body mass index is 54.45 kg/m.  Physical Exam Constitutional:  Appearance: Normal appearance.  HENT:  Head: Normocephalic and atraumatic.  Pulmonary:  Effort: Pulmonary effort is normal.  Musculoskeletal:  General: Normal range of motion.  Cervical back: Normal range of motion.  Neurological:  General: No focal deficit present.  Mental Status: She is alert and oriented to person, place, and time. Mental status is at baseline.  Psychiatric:  Mood and Affect: Mood normal.  Behavior: Behavior normal.  Thought Content: Thought content normal.  Labs, Imaging and Diagnostic Testing:  I reviewed labs, consults, normal UGI  Assessment and Plan:   Diagnoses and all orders for this visit:  Morbid (severe) obesity due to excess calories (CMS-HCC)  Paroxysmal atrial fibrillation (CMS-HCC)  Hold xarelto after 6/29 dose   The patient meets weight loss surgery  criteria. Due to the above reasons, I think minimally invasive vertical sleeve gastrectomy is the best option for the patient.   We discussed sleeve gastrectomy. We discussed the preoperative, operative and postoperative process. I explained the surgery in detail including the performance of an EGD near the end of the surgery to test for leak. We discussed the typical hospital course including a 1-2 day stay baring any complications. The patient was given educational material. I quoted the patient that most patients can lose up to 50-70% of their excess weight. We did discuss the possibility of weight regain several years after the procedure.   The risks of infection, bleeding, pain, scarring, weight regain, too little or too much weight loss, vitamin deficiencies and need for lifelong vitamin supplementation, hair loss, need for protein supplementation, leaks, stricture, reflux, food intolerance, gallstone formation, hernia, need for reoperation, need for open surgery, injury to spleen or surrounding structures, DVT's, PE, and death again discussed with the patient and the patient expressed understanding and desires to proceed with minimally invasive sleeve gastrectomy, possible open, intraoperative endoscopy.  We discussed that before and after surgery that there would be an alteration in their diet. I explained that we may put them on a diet 2 weeks before surgery. I also explained that they would be on a liquid diet for 2 weeks after surgery. We discussed that they would have to avoid certain foods after surgery. We discussed the importance of physical activity as well as compliance with our dietary and supplement recommendations and routine follow-up.

## 2022-04-29 NOTE — Op Note (Signed)
Preop Diagnosis: Obesity Class III  Postop Diagnosis: same  Procedure performed: laparoscopic Sleeve Gastrectomy  Assitant: Kaylyn Lim  Indications:  The patient is a 56 y.o. year-old morbidly obese female who has been followed in the Bariatric Clinic as an outpatient. This patient was diagnosed with morbid obesity with a BMI of Body mass index is 54.1 kg/m. and significant co-morbidities including hypertension and GERD.  The patient was counseled extensively in the Bariatric Outpatient Clinic and after a thorough explanation of the risks and benefits of surgery (including death from complications, bowel leak, infection such as peritonitis and/or sepsis, internal hernia, bleeding, need for blood transfusion, bowel obstruction, organ failure, pulmonary embolus, deep venous thrombosis, wound infection, incisional hernia, skin breakdown, and others entailed on the consent form) and after a compliant diet and exercise program, the patient was scheduled for an elective laparoscopic sleeve gastrectomy.  Description of Operation:  Following informed consent, the patient was taken to the operating room and placed on the operating table in the supine position.  She had previously received prophylactic antibiotics and subcutaneous heparin for DVT prophylaxis in the pre-op holding area.  After induction of general endotracheal anesthesia by the anesthesiologist, the patient underwent placement of sequential compression devices and an oro-gastric tube.  A timeout was confirmed by the surgery and anesthesia teams.  The patient was adequately padded at all pressure points and placed on a footboard to prevent slippage from the OR table during extremes of position during surgery.  She underwent a routine sterile prep and drape of her entire abdomen.    Next, A transverse incision was made at the left mid abdomen area and a 7m optical viewing trocar was introduced into the peritoneal cavity. Pneumoperitoneum was  applied with a high flow and low pressure. A laparoscope was inserted to confirm placement. A extraperitoneal block was then placed at the lateral abdominal wall using exparel diluted with marcaine. 5 additional incisions were placed: 1 111mtrocar to the right of the midline. 1 additional 58m59mrocar in the left mid abdominal area, 1 58mm1mocar in the left lateral abdomen, 1 5mm 59mcar in the left lower quadrant subcostal area, and a Nathanson retractor was placed through a subxiphoid incision.  There was a large amount of adhesive disease of the omentum to the diaphragm and abdominal wall that was taken down sharply with scissors and cautery.  Next, a hole was created through the lesser omentum along the greater curve of the stomach to enter the lesser sac. The vessels along the greater omentum were  Then ligated and divided using the Harmonic scalpel moving towards the spleen and then short gastric vessels were ligated and divided in the same fashion to fully mobilize the fundus. The left crus was identified to ensure completion of the dissection. Next the antrum was measured and dissection continued inferiorly along the greater curve towards the pylorus and stopped 6cm from the pylorus.   A 40Fr ViSiGi dilator was placed into the esophgaus and along the lesser curve of the stomach and placed on suction. 1 60mm 3m load robot stapler(s)  followed by 4 60mm w95m load robotic stapler(s)were used to make the resection along the antrum being sure to stay well away from the angularis by angling the jaws of the stapler towards the greater curve and later completing the resection staying along the ViSiGi [place] the fundus was not retained by appropriately retracting it lateral. Air was inserted through the ViSiGi [place] leak test showing no  bubbles and a neutral lie of the stomach.  Next I performed upper endoscopy.GEJ junction was identified at  36 cm from the lips. No bubbles were seen and the  sleeve and antrum distended appropriately. The specimen was then placed in an endocatch bag and removed by the 20m port. The fascia of the 171mport was closed with a 0 vicryl by suture passer. Hemostasis was ensured. Pneumoperitoneum was evacuated, all ports were removed and all incisions closed with 4-0 monocryl suture in subcuticular fashion. Steristrips and bandaids were put in place for dressing. The patient awoke from anesthesia and was brought to pacu in stable condition. All counts were correct.  Estimated blood loss: <3023mSpecimens:  Sleeve gastrectomy  Local Anesthesia: 70 ml Exparel:0.5% Marcaine mix  Post-Op Plan:       Pain Management: PO, prn      Antibiotics: Prophylactic      Anticoagulation: Prophylactic, Starting now      Post Op Studies/Consults: Not applicable      Intended Discharge: within 48h      Intended Outpatient Follow-Up: Two Week      Intended Outpatient Studies: Not Applicable      Other: Not Applicable  Monique Clark

## 2022-04-29 NOTE — Anesthesia Procedure Notes (Signed)
Procedure Name: Intubation Date/Time: 04/29/2022 1:03 PM  Performed by: West Pugh, CRNAPre-anesthesia Checklist: Patient identified, Emergency Drugs available, Suction available, Patient being monitored and Timeout performed Patient Re-evaluated:Patient Re-evaluated prior to induction Oxygen Delivery Method: Circle system utilized Preoxygenation: Pre-oxygenation with 100% oxygen Induction Type: IV induction Ventilation: Mask ventilation without difficulty Laryngoscope Size: 3 and Glidescope Grade View: Grade I Tube type: Oral Tube size: 7.0 mm Number of attempts: 1 Airway Equipment and Method: Rigid stylet and Video-laryngoscopy Placement Confirmation: ETT inserted through vocal cords under direct vision, positive ETCO2, CO2 detector and breath sounds checked- equal and bilateral Secured at: 22 cm Tube secured with: Tape Dental Injury: Teeth and Oropharynx as per pre-operative assessment

## 2022-04-29 NOTE — Discharge Instructions (Signed)

## 2022-04-29 NOTE — Transfer of Care (Signed)
Immediate Anesthesia Transfer of Care Note  Patient: Copper Kirtley  Procedure(s) Performed: XI ROBOT ASSISTED GASTRIC SLEEVE RESECTION UPPER GI ENDOSCOPY  Patient Location: PACU  Anesthesia Type:General  Level of Consciousness: awake, alert , oriented and patient cooperative  Airway & Oxygen Therapy: Patient Spontanous Breathing and Patient connected to face mask oxygen  Post-op Assessment: Report given to RN, Post -op Vital signs reviewed and stable and Patient moving all extremities X 4  Post vital signs: Reviewed and stable  Last Vitals:  Vitals Value Taken Time  BP 108/64 04/29/22 1545  Temp    Pulse 59 04/29/22 1547  Resp 25 04/29/22 1547  SpO2 94 % 04/29/22 1547  Vitals shown include unvalidated device data.  Last Pain:  Vitals:   04/29/22 1224  TempSrc: Oral         Complications: No notable events documented.

## 2022-04-30 ENCOUNTER — Other Ambulatory Visit (HOSPITAL_COMMUNITY): Payer: Self-pay

## 2022-04-30 ENCOUNTER — Encounter (HOSPITAL_COMMUNITY): Payer: Self-pay | Admitting: General Surgery

## 2022-04-30 LAB — CBC WITH DIFFERENTIAL/PLATELET
Abs Immature Granulocytes: 0.07 10*3/uL (ref 0.00–0.07)
Basophils Absolute: 0 10*3/uL (ref 0.0–0.1)
Basophils Relative: 0 %
Eosinophils Absolute: 0 10*3/uL (ref 0.0–0.5)
Eosinophils Relative: 0 %
HCT: 36.2 % (ref 36.0–46.0)
Hemoglobin: 11.6 g/dL — ABNORMAL LOW (ref 12.0–15.0)
Immature Granulocytes: 0 %
Lymphocytes Relative: 9 %
Lymphs Abs: 1.5 10*3/uL (ref 0.7–4.0)
MCH: 30 pg (ref 26.0–34.0)
MCHC: 32 g/dL (ref 30.0–36.0)
MCV: 93.5 fL (ref 80.0–100.0)
Monocytes Absolute: 0.9 10*3/uL (ref 0.1–1.0)
Monocytes Relative: 5 %
Neutro Abs: 14.5 10*3/uL — ABNORMAL HIGH (ref 1.7–7.7)
Neutrophils Relative %: 86 %
Platelets: 388 10*3/uL (ref 150–400)
RBC: 3.87 MIL/uL (ref 3.87–5.11)
RDW: 16.9 % — ABNORMAL HIGH (ref 11.5–15.5)
WBC: 17.1 10*3/uL — ABNORMAL HIGH (ref 4.0–10.5)
nRBC: 0.3 % — ABNORMAL HIGH (ref 0.0–0.2)

## 2022-04-30 LAB — SURGICAL PATHOLOGY

## 2022-04-30 MED ORDER — LIP MEDEX EX OINT
TOPICAL_OINTMENT | CUTANEOUS | Status: DC | PRN
  Filled 2022-04-30: qty 7

## 2022-04-30 MED ORDER — PANTOPRAZOLE SODIUM 40 MG PO TBEC
40.0000 mg | DELAYED_RELEASE_TABLET | Freq: Every day | ORAL | Status: DC
Start: 2022-04-30 — End: 2022-05-01
  Administered 2022-04-30 – 2022-05-01 (×2): 40 mg via ORAL
  Filled 2022-04-30 (×2): qty 1

## 2022-04-30 MED ORDER — ZOLPIDEM TARTRATE 5 MG PO TABS: 5.0000 mg | ORAL_TABLET | Freq: Every evening | ORAL | Status: DC | PRN

## 2022-04-30 MED ORDER — FLECAINIDE ACETATE 50 MG PO TABS
100.0000 mg | ORAL_TABLET | Freq: Two times a day (BID) | ORAL | Status: DC
  Administered 2022-04-30 – 2022-05-01 (×3): 100 mg via ORAL
  Filled 2022-04-30 (×3): qty 2

## 2022-04-30 NOTE — Consult Note (Signed)
   [facility] Inpatient Consult   04/30/2022  [name] April 23, 1966 [id number]   [facility] [ACO] Patient: Monique Clark  *Review coverage for [facility]  Primary Care Provider:  Hoyt Koch, MD,[place] [facility] is an Embedded provider and is listed to provide the [facility] follow up.  Acknowledgement of admission and brief review of inpatient [facility] team notes for potential needs for support in the health plan.   Plan: Patient will be followed by Embedded team for TOC.  For additional questions or referrals please contact:   Natividad Brood, RN BSN [facility] Liaison  [phone] business mobile phone Toll free office [phone]  Fax number: [phone] [name]

## 2022-04-30 NOTE — Progress Notes (Signed)
Patient alert and oriented, pain is controlled. Patient is tolerating fluids, advanced to protein shake today, patient is tolerating well.  Reviewed Gastric sleeve discharge instructions with patient and patient is able to articulate understanding.  Provided information on BELT program, Support Group and [facility] outpatient pharmacy. All questions answered, will continue to monitor.  

## 2022-04-30 NOTE — Progress Notes (Signed)
Progress Note: Metabolic and Bariatric Surgery Service   Chief Complaint/Subjective: Tolerating liquids well, moderate pain issues, IV infiltrated overnight  Objective: Vital signs in last 24 hours: Temp:  [97.8 F (36.6 C)-98.6 F (37 C)] 98 F (36.7 C) (08/02 0920) Pulse Rate:  [52-69] 56 (08/02 0920) Resp:  [12-22] 18 (08/02 0505) BP: (102-133)/(61-80) 112/61 (08/02 0920) SpO2:  [89 %-100 %] 94 % (08/02 0920) Weight:  [145.2 kg] 145.2 kg (08/01 1224) Last BM Date : 04/29/22  Intake/Output from previous day: 08/01 0701 - 08/02 0700 In: 2579.4 [P.O.:480; I.V.:1949.4; IV Piggyback:150] Out: 1350 [Urine:1300; Blood:50] Intake/Output this shift: No intake/output data recorded.  Lungs: nonlabored  Cardiovascular: RRR  Abd: soft, incisions c/d/i  Extremities: no edema  Neuro: AOx4  Lab Results: CBC  Recent Labs    04/29/22 1743 04/29/22 2017 04/30/22 0354  WBC 23.2*  --  17.1*  HGB 12.8 11.8* 11.6*  HCT 42.8 37.8 36.2  PLT 403*  --  388   BMET Recent Labs    04/29/22 1743  CREATININE 0.87   PT/INR No results for input(s): "LABPROT", "INR" in the last 72 hours. ABG No results for input(s): "PHART", "HCO3" in the last 72 hours.  Invalid input(s): "PCO2", "PO2"  Studies/Results:  Anti-infectives: Anti-infectives (From admission, onward)    Start     Dose/Rate Route Frequency Ordered Stop   04/29/22 2030  fluconazole (DIFLUCAN) tablet 200 mg        200 mg Oral Daily 04/29/22 1959     04/29/22 1215  cefoTEtan (CEFOTAN) 2 g in sodium chloride 0.9 % 100 mL IVPB        2 g 200 mL/hr over 30 Minutes Intravenous On call to O.R. 04/29/22 1210 04/29/22 1336       Medications: Scheduled Meds:  acetaminophen  1,000 mg Oral Q8H   Or   acetaminophen (TYLENOL) oral liquid 160 mg/5 mL  1,000 mg Oral Q8H   enoxaparin (LOVENOX) injection  30 mg Subcutaneous Q12H   flecainide  100 mg Oral Q12H   fluconazole  200 mg Oral Daily   pantoprazole  40 mg Oral  Daily   Ensure Max Protein  2 oz Oral Q2H   scopolamine  1 patch Transdermal On Call to OR   Continuous Infusions:  dextrose 5 % and 0.45% NaCl 100 mL/hr at 04/29/22 1734   PRN Meds:.hydrALAZINE, morphine injection, ondansetron (ZOFRAN) IV, oxyCODONE, simethicone, zolpidem  Assessment/Plan: Patient Active Problem List   Diagnosis Date Noted   Morbid (severe) obesity due to excess calories ([place]) 04/29/2022   Leukocytosis 01/20/2022   Class 3 obesity with alveolar hypoventilation without serious comorbidity with body mass index (BMI) of 50.0 to 59.9 in adult ([place]) 01/01/2022   Snoring 01/01/2022   Non-restorative sleep 01/01/2022   Chronic fatigue and immune dysfunction syndrome ([place]) 01/01/2022   High risk medication use 12/02/2021   Otitis externa 11/05/2021   PMR (polymyalgia rheumatica) ([place]) 11/05/2021   Insomnia 05/28/2021   Malignant (primary) neoplasm, unspecified ([place]) 03/08/2021   Personal history of urinary (tract) infections 03/08/2021   Personal history of pneumonia (recurrent) 03/08/2021   Elevated serum hCG 07/02/2020   POTS (postural orthostatic tachycardia syndrome) 03/08/2020   Intermittent claudication ([place]) 01/04/2020   Mixed hyperlipidemia 03/21/2019   Palpitations 03/21/2019   Paroxysmal atrial fibrillation ([facility])    Localized swelling of left lower extremity    Lightheadedness    Essential hypertension    Cellulitis 06/22/2017   Mitral valve regurgitation 06/22/2017   Morbid  obesity with body mass index (BMI) of 50.0 to 59.9 in adult [facility]) 12/28/2015   CVID (common variable immunodeficiency) ([place]) 05/07/2015   Post-nasal discharge 03/30/2013   Prediabetes 01/20/2013   History of non-Hodgkin's lymphoma 12/21/2012   Low back pain radiating to right lower extremity 12/10/2012   Right lumbar radiculitis 12/10/2012   Mononucleosis 12/08/1983   s/p Procedure(s): XI ROBOT ASSISTED GASTRIC SLEEVE RESECTION UPPER GI ENDOSCOPY 04/29/2022 -Hgb stable -pain  issues -restart most home medications -plan to stay in hospital due to anticoagulation needs and pain control  Disposition:  LOS: 1 day  The patient will be in the hospital for normal postop protocol  Mickeal Skinner, MD [phone] [facility]

## 2022-04-30 NOTE — Progress Notes (Signed)
Transition of Care [facility]) Screening Note  Patient Details  Name: Monique Clark Date of Birth: 1965-11-09  Transition of Care [facility]) CM/SW Contact:    Sherie Don, LCSW Phone Number: 04/30/2022, 10:26 AM  Transition of Care Department [facility]) has reviewed patient and no TOC needs have been identified at this time. We will continue to monitor patient advancement through interdisciplinary progression rounds. If new patient transition needs arise, please place a TOC consult.

## 2022-04-30 NOTE — Progress Notes (Signed)
Dr Kieth Brightly aware IV out. Order received to leave out for now. Po good.

## 2022-05-01 ENCOUNTER — Other Ambulatory Visit (HOSPITAL_COMMUNITY): Payer: Self-pay

## 2022-05-01 LAB — CBC WITH DIFFERENTIAL/PLATELET
Abs Immature Granulocytes: 0.05 10*3/uL (ref 0.00–0.07)
Basophils Absolute: 0 10*3/uL (ref 0.0–0.1)
Basophils Relative: 0 %
Eosinophils Absolute: 0 10*3/uL (ref 0.0–0.5)
Eosinophils Relative: 0 %
HCT: 33.7 % — ABNORMAL LOW (ref 36.0–46.0)
Hemoglobin: 10.5 g/dL — ABNORMAL LOW (ref 12.0–15.0)
Immature Granulocytes: 0 %
Lymphocytes Relative: 27 %
Lymphs Abs: 4.2 10*3/uL — ABNORMAL HIGH (ref 0.7–4.0)
MCH: 29.6 pg (ref 26.0–34.0)
MCHC: 31.2 g/dL (ref 30.0–36.0)
MCV: 94.9 fL (ref 80.0–100.0)
Monocytes Absolute: 1.5 10*3/uL — ABNORMAL HIGH (ref 0.1–1.0)
Monocytes Relative: 10 %
Neutro Abs: 9.6 10*3/uL — ABNORMAL HIGH (ref 1.7–7.7)
Neutrophils Relative %: 63 %
Platelets: 350 10*3/uL (ref 150–400)
RBC: 3.55 MIL/uL — ABNORMAL LOW (ref 3.87–5.11)
RDW: 17.2 % — ABNORMAL HIGH (ref 11.5–15.5)
WBC: 15.4 10*3/uL — ABNORMAL HIGH (ref 4.0–10.5)
nRBC: 0.3 % — ABNORMAL HIGH (ref 0.0–0.2)

## 2022-05-01 MED ORDER — NYSTATIN-TRIAMCINOLONE 100000-0.1 UNIT/GM-% EX OINT
1.0000 | TOPICAL_OINTMENT | Freq: Two times a day (BID) | CUTANEOUS | 0 refills | Status: DC
  Filled 2022-05-01: qty 30, 7d supply, fill #0

## 2022-05-01 MED ORDER — ONDANSETRON 4 MG PO TBDP
4.0000 mg | ORAL_TABLET | Freq: Four times a day (QID) | ORAL | 0 refills | Status: DC | PRN
  Filled 2022-05-01: qty 20, 5d supply, fill #0

## 2022-05-01 MED ORDER — OXYCODONE HCL 5 MG PO TABS
5.0000 mg | ORAL_TABLET | Freq: Four times a day (QID) | ORAL | 0 refills | Status: DC | PRN
  Filled 2022-05-01: qty 10, 3d supply, fill #0

## 2022-05-01 NOTE — Progress Notes (Signed)
Patient received discharge instructions verbal and written. All questions answered. Personal belongings with patient at time of d/c. Patient in stable condition.

## 2022-05-01 NOTE — Discharge Summary (Signed)
Physician Discharge Summary  Monique Clark TIW:[id number] DOB: [birth year] DOA: 04/29/2022  PCP: Hoyt Koch, MD  Admit date: 04/29/2022 Discharge date: 05/01/2022  Recommendations for Outpatient Follow-up:   (include homehealth, outpatient follow-up instructions, specific recommendations for PCP to follow-up on, etc.)   Follow-up Information     Zaim Nitta, Arta Bruce, MD. Go on 05/21/2022.   Specialty: General Surgery Why: at 10am.  Please arrive 15 minutes prior to your appointment time.  Thank you. Contact information: [address] [place] [address] [phone]         Devyne Hauger, Arta Bruce, MD. Go on 06/25/2022.   Specialty: General Surgery Why: at 9:10am. Please arrive 15 minutes prior to your appointment time.  Thank you. Contact information: [address] [place] [address] [phone]                Discharge Diagnoses:  Principal Problem:   Morbid (severe) obesity due to excess calories [facility])   Surgical Procedure: laparoscopic sleeve gastrectomy, upper endoscopy  Discharge Condition: Good Disposition: Home  Diet recommendation: Postoperative sleeve gastrectomy diet (liquids only)  Filed Weights   04/29/22 1224  Weight: (!) 145.2 kg     Hospital Course:  The patient was admitted after undergoing laparoscopic sleeve gastrectomy. POD 0 she ambulated well. POD 1 she was started on the water diet protocol and tolerated 300 ml in the first shift. Once meeting the water amount she was advanced to bariatric protein shakes which they tolerated and were discharged home POD 2.  Treatments: surgery: laparoscopic sleeve gastrectomy  Discharge Instructions  Discharge Instructions     Ambulate hourly while awake   Complete by: As directed    Call MD for:  difficulty breathing, headache or visual disturbances   Complete by: As directed    Call MD for:  persistant dizziness or light-headedness   Complete by: As  directed    Call MD for:  persistant nausea and vomiting   Complete by: As directed    Call MD for:  redness, tenderness, or signs of infection (pain, swelling, redness, odor or green/yellow discharge around incision site)   Complete by: As directed    Call MD for:  severe uncontrolled pain   Complete by: As directed    Call MD for:  temperature >101 F   Complete by: As directed    Diet bariatric full liquid   Complete by: As directed    Discharge wound care:   Complete by: As directed    Remove Bandaids tomorrow, ok to shower tomorrow. Steristrips may fall off in 1-3 weeks.   Incentive spirometry   Complete by: As directed    Perform hourly while awake      Allergies as of 05/01/2022       Reactions   Tetanus-diphtheria Toxoids Td Swelling   angioedema   Nizatidine Hives   Axid   Promethazine Other (See Comments)   High doses causes confusion   Tetanus Toxoids Other (See Comments)   angioedema        Medication List     STOP taking these medications    doxycycline 100 MG capsule Commonly known as: VIBRAMYCIN   ibuprofen 600 MG tablet Commonly known as: ADVIL   predniSONE 10 MG tablet Commonly known as: DELTASONE       TAKE these medications    acetaminophen 325 MG tablet Commonly known as: TYLENOL Take 650 mg by mouth every 6 (six) hours as needed for moderate  pain.   albuterol 108 (90 Base) MCG/ACT inhaler Commonly known as: VENTOLIN HFA Inhale 2 puffs into the lungs every 6 (six) hours as needed for shortness of breath or wheezing.   cetirizine 10 MG tablet Commonly known as: ZYRTEC Take 1 tablet (10 mg total) by mouth daily. What changed: when to take this   diphenhydrAMINE 25 MG tablet Commonly known as: BENADRYL Take 25-50 mg by mouth See admin instructions. Take 25 mg by mouth as needed for allergies or itching and take 50 mg by mouth with Hizentra infusion   DULoxetine 60 MG capsule Commonly known as: Cymbalta Take 1 capsule (60 mg  total) by mouth daily.   famotidine 20 MG tablet Commonly known as: PEPCID Take 20 mg by mouth every evening.   flecainide 100 MG tablet Commonly known as: TAMBOCOR Take 1 tablet by mouth every 12 hours   fluticasone 50 MCG/ACT nasal spray Commonly known as: FLONASE Place 2 sprays into both nostrils daily as needed for allergies.   folic acid 1 MG tablet Commonly known as: FOLVITE Take 1 tablet (1 mg total) by mouth daily.   Immune Globulin (Human) 4 GM/20ML Soln Inject 36 g into the skin every 14 (fourteen) days. Monday   methocarbamol 500 MG tablet Commonly known as: Robaxin Take 1 tablet (500 mg total) by mouth every 6 (six) hours as needed for muscle spasms.   methotrexate 2.5 MG tablet Take 6 tablets (15 mg total) by mouth once a week. Caution:Chemotherapy. Protect from light.   montelukast 10 MG tablet Commonly known as: Singulair Take 1 tablet (10 mg total) by mouth at bedtime.   multivitamin with minerals Tabs tablet Take 1 tablet by mouth daily.   nebivolol 5 MG tablet Commonly known as: Bystolic Take 1 tablet (5 mg total) by mouth daily. What changed: Another medication with the same name was removed. Continue taking this medication, and follow the directions you see here.   NON FORMULARY Pt uses a cpap nightly   nystatin-triamcinolone ointment Commonly known as: MYCOLOG Apply to affected area 2 (two) times daily.   ondansetron 4 MG disintegrating tablet Commonly known as: ZOFRAN-ODT Dissolve 1 tablet (4 mg total) by mouth every 6 (six) hours as needed for nausea or vomiting.   oxyCODONE 5 MG immediate release tablet Commonly known as: Oxy IR/ROXICODONE Take 1 tablet (5 mg total) by mouth every 6 (six) hours as needed for severe pain.   Vitamin D3 1.25 MG (50000 UT) Caps TAKE 1 CAPSULE BY MOUTH TWICE A WEEK AS DIRECTED   Xarelto 20 MG Tabs tablet Generic drug: rivaroxaban Take 1 tablet by mouth every day What changed: Another medication with the  same name was removed. Continue taking this medication, and follow the directions you see here.   zolpidem 5 MG tablet Commonly known as: Ambien Take 1 tablet by mouth at bedtime as needed for sleep.               Discharge Care Instructions  (From admission, onward)           Start     Ordered   05/01/22 0000  Discharge wound care:       Comments: Remove Bandaids tomorrow, ok to shower tomorrow. Steristrips may fall off in 1-3 weeks.   05/01/22 1355            Follow-up Information     Brealynn Contino, Arta Bruce, MD. Go on 05/21/2022.   Specialty: General Surgery Why: at 10am.  Please arrive  15 minutes prior to your appointment time.  Thank you. Contact information: [address] [place] [address] [phone]         Mackay Hanauer, Arta Bruce, MD. Go on 06/25/2022.   Specialty: General Surgery Why: at 9:10am. Please arrive 15 minutes prior to your appointment time.  Thank you. Contact information: [address] [place] [address] [phone]                  The results of significant diagnostics from this hospitalization (including imaging, microbiology, ancillary and laboratory) are listed below for reference.    Significant Diagnostic Studies: No results found.  Labs: Basic Metabolic Panel: Recent Labs  Lab 04/29/22 1743  CREATININE 0.87   Liver Function Tests: No results for input(s): "AST", "ALT", "ALKPHOS", "BILITOT", "PROT", "ALBUMIN" in the last 168 hours.  CBC: Recent Labs  Lab 04/29/22 1743 04/29/22 2017 04/30/22 0354 05/01/22 0354  WBC 23.2*  --  17.1* 15.4*  NEUTROABS  --   --  14.5* 9.6*  HGB 12.8 11.8* 11.6* 10.5*  HCT 42.8 37.8 36.2 33.7*  MCV 97.7  --  93.5 94.9  PLT 403*  --  388 350    CBG: Recent Labs  Lab 04/29/22 1222  GLUCAP 93    Principal Problem:   Morbid (severe) obesity due to excess calories [facility])   VTE plan: patient will restart xarelto  tomorrow ([email or web address])  Time coordinating discharge: 15 min

## 2022-05-05 ENCOUNTER — Telehealth (HOSPITAL_COMMUNITY): Payer: Self-pay | Admitting: *Deleted

## 2022-05-05 NOTE — Telephone Encounter (Signed)
1.  Tell me about your pain and pain management? Pt denies any pain.  2.  Let's talk about fluid intake.  How much total fluid are you taking in? Pt states that she is getting in at least 54oz of fluid including protein shakes, bottled water, and tea. Pt instructed to assess status and suggestions daily utilizing Hydration Action Plan on discharge folder and to call [facility] if in the "red zone".   3.  How much protein have you taken in the last 2 days? Pt states she is meeting her goal of 60g of protein each day with the protein shakes.  4.  Have you had nausea?  Tell me about when have experienced nausea and what you did to help? Pt denies nausea.   5.  Has the frequency or color changed with your urine? Pt states that she is urinating "fine" with no changes in frequency or urgency.     6.  Tell me what your incisions look like? Pt has rash under both breasts. Pt has been using antifungal powders and creams to treat. "Incisions look fine" with the exception of 1 that has lost its steri strips "right under my breast".  Pt states it "looks red".  Pt denies a fever, chills.  Pt states that the other incisions are "fine" and are not swollen, open, or draining.  Pt encouraged to call [facility] for concern about incision.    Pt has not showered. Instructed pt to remove outer bandages prior to shower and leave incisions open to air and monitor for any abnormal symptoms.  7.  Have you been passing gas? BM? Pt states that she has not had a BM.  Pt has taken Miralax as instructed per "Gastric Bypass/Sleeve Discharge Home Care Instructions".  Pt to call surgeon's office if not able to have BM with medication.   8.  If a problem or question were to arise who would you call?  Do you know contact numbers for [place], [facility], and [facility]? Pt denies dehydration symptoms.  Pt can describe s/sx of dehydration.  Pt knows to call [facility] for surgical, [facility] for nutrition, and [facility] for non-urgent questions or concerns.   9.  How has  the walking going? Pt states she is walking around and able to be active without difficulty.   10. Are you still using your incentive spirometer?  If so, how often? Pt states that she is doing the I.S. and achieving 1250. Pt encouraged to use incentive spirometer, at least 10x every hour while awake until she sees the surgeon.  11.  How are your vitamins and calcium going?  How are you taking them? Pt states that she is taking her supplements and vitamins without difficulty.  Patient resumed Xarelto '20mg'$  PO daily on Thursday 05/01/22.  Reminded patient that the first 30 days post-operatively are important for successful recovery.  Practice good hand hygiene, wearing a mask when appropriate (since optional in most places), and minimizing exposure to people who live outside of the home, especially if they are exhibiting any respiratory, GI, or illness-like symptoms.

## 2022-05-06 ENCOUNTER — Other Ambulatory Visit (HOSPITAL_COMMUNITY): Payer: Self-pay

## 2022-05-06 ENCOUNTER — Other Ambulatory Visit: Payer: Self-pay | Admitting: Internal Medicine

## 2022-05-06 DIAGNOSIS — M353 Polymyalgia rheumatica: Secondary | ICD-10-CM

## 2022-05-06 MED ORDER — OXYCODONE HCL 5 MG PO TABS
5.0000 mg | ORAL_TABLET | Freq: Four times a day (QID) | ORAL | 0 refills | Status: DC | PRN
  Filled 2022-05-06: qty 10, 3d supply, fill #0

## 2022-05-06 MED ORDER — METHOTREXATE 2.5 MG PO TABS
15.0000 mg | ORAL_TABLET | ORAL | 0 refills | Status: DC
  Filled 2022-05-06 – 2022-06-20 (×2): qty 30, 35d supply, fill #0

## 2022-05-06 MED ORDER — NYSTATIN-TRIAMCINOLONE 100000-0.1 UNIT/GM-% EX OINT
1.0000 | TOPICAL_OINTMENT | Freq: Two times a day (BID) | CUTANEOUS | 0 refills | Status: DC
  Filled 2022-05-06: qty 30, 15d supply, fill #0

## 2022-05-06 MED ORDER — FOLIC ACID 1 MG PO TABS
1.0000 mg | ORAL_TABLET | Freq: Every day | ORAL | 0 refills | Status: DC
  Filled 2022-05-06 – 2022-06-20 (×2): qty 90, 90d supply, fill #0

## 2022-05-06 NOTE — Telephone Encounter (Signed)
Next Visit: 05/20/2022  Last Visit: 01/06/2022  Last Fill: 01/10/2022  DX: PMR (polymyalgia rheumatica)  Current Dose per office note 01/06/2022: methotrexate 6 tablets 15 mg weekly, folic acid 1 tablet daily.  Labs: 05/01/2022 CBC WNL except WBC 15.4 HIGH, RBC 3.55 LOW, Hemoglobin 10.5 LOW, HCT 33.7 LOW, RDW 17.2 HIGH, nRBC 0.3 HIGH, Neutro Abs 9.6 HIGH, Lymphs Abs 4.2 HIGH, Mon0cytes Absolute 1.5 HIGH  04/16/2022 CMP WNL except BUN 21 HIGH, Albumin 3.4 LOW,   Okay to refill Methotrexate and Folic Acid?

## 2022-05-07 ENCOUNTER — Other Ambulatory Visit (HOSPITAL_COMMUNITY): Payer: Self-pay

## 2022-05-08 DIAGNOSIS — G4733 Obstructive sleep apnea (adult) (pediatric): Secondary | ICD-10-CM | POA: Diagnosis not present

## 2022-05-13 ENCOUNTER — Ambulatory Visit (INDEPENDENT_AMBULATORY_CARE_PROVIDER_SITE_OTHER): Payer: 59

## 2022-05-13 ENCOUNTER — Ambulatory Visit (INDEPENDENT_AMBULATORY_CARE_PROVIDER_SITE_OTHER): Payer: 59 | Admitting: Orthopaedic Surgery

## 2022-05-13 ENCOUNTER — Encounter: Payer: 59 | Attending: General Surgery | Admitting: Dietician

## 2022-05-13 ENCOUNTER — Encounter: Payer: Self-pay | Admitting: Dietician

## 2022-05-13 ENCOUNTER — Encounter: Payer: Self-pay | Admitting: Orthopaedic Surgery

## 2022-05-13 VITALS — BP 117/82 | HR 83 | Ht 65.0 in | Wt 306.0 lb

## 2022-05-13 DIAGNOSIS — Z6841 Body Mass Index (BMI) 40.0 and over, adult: Secondary | ICD-10-CM | POA: Insufficient documentation

## 2022-05-13 DIAGNOSIS — M545 Low back pain, unspecified: Secondary | ICD-10-CM

## 2022-05-13 DIAGNOSIS — M7542 Impingement syndrome of left shoulder: Secondary | ICD-10-CM | POA: Diagnosis not present

## 2022-05-13 NOTE — Progress Notes (Signed)
2 Week Post-Operative Nutrition Class   Patient was seen on 05/13/2022 for Post-Operative Nutrition education at the [facility].    Surgery date: 04/29/2022 Surgery type: Sleeve Gastrectomy  Anthropometrics  Start weight at [facility]: 324.9 lbs (date: 11/25/2021)  Height: 64.5 in BMI: 54.91 kg/m2     Clinical  Medical hx: GDM, cancer: non hodgkin lymphoma, arthritis, hyperlipidemia, hypertension, A-fib, diverticulosis, POTS, PMR Medications: RDW 17, albumin 3.2 Labs: A1C 5.7 Notable signs/symptoms: nothing noted Any previous deficiencies? No   Body Composition Scale 05/13/2022  Current Body Weight 308.0  Total Body Fat % 50.2  Visceral Fat 23  Fat-Free Mass % 49.7   Total Body Water % 39.3  Muscle-Mass lbs 31.8  BMI 50.9  Body Fat Displacement          Torso  lbs 96.0         Left Leg  lbs 19.2         Right Leg  lbs 19.2         Left Arm  lbs 9.6         Right Arm   lbs 9.6      The following the learning objectives were met by the patient during this course: Identifies Phase 3 (Soft, High Proteins) Dietary Goals and will begin from 2 weeks post-operatively to 2 months post-operatively Identifies appropriate sources of fluids and proteins  Identifies appropriate fat sources and healthy verses unhealthy fat types   States protein recommendations and appropriate sources post-operatively Identifies the need for appropriate texture modifications, mastication, and bite sizes when consuming solids Identifies appropriate fat consumption and sources Identifies appropriate multivitamin and calcium sources post-operatively Describes the need for physical activity post-operatively and will follow MD recommendations States when to call healthcare provider regarding medication questions or post-operative complications   Handouts given during class include: Phase 3A: Soft, High Protein Diet Handout Phase 3 High Protein Meals Healthy Fats   Follow-Up  Plan: Patient will follow-up at [facility] in 6 weeks for 2 month post-op nutrition visit for diet advancement per MD.

## 2022-05-13 NOTE — Progress Notes (Signed)
Office Visit Note   Patient: Monique Clark           Date of Birth: 1965/12/30           MRN: [id number] Visit Date: 05/13/2022              Requested by: Hoyt Koch, MD [address] [place],  [place] [address] PCP: Hoyt Koch, MD   Assessment & Plan: Visit Diagnoses:  1. Acute left-sided low back pain, unspecified whether sciatica present   2. Impingement syndrome of left shoulder     Plan: Patient will try to use a higher chair when she sits to use arms and only use her right arm to push up.  She likely is aggravating her previous rotator cuff repair on the left shoulder since she is having to use her arms to get up to an upright position out of a chair.  I plan to recheck her in 1 month.  We discussed previous surgeries on the right or symptoms on the left.  She did not have left side nerve compression on previous MRI scan which we reviewed from scan dated 06/14/2021.  We discussed avoiding prednisone in early postop time.  After other procedure.  Recheck 1 month if she still having symptoms we may consider reimaging.  Follow-Up Instructions: Return in about 1 month (around 06/13/2022).   Orders:  Orders Placed This Encounter  Procedures   XR Lumbar Spine 2-3 Views   No orders of the defined types were placed in this encounter.     Procedures: No procedures performed   Clinical Data: No additional findings.   Subjective: Chief Complaint  Patient presents with   Lower Back - Pain   Left Leg - Pain    HPI 56 year old female returns states she has had recurrent back symptoms and pain down her left leg.  She is also noticed some problem with the swelling in her left proximal arm and her shoulder region with soreness and relates she has had previous rotator cuff repair in the distant past.  She had right L4-5 microdiscectomy by me 09/18/2021.  Pains down the opposite left leg.  Gastric sleeve resection on 04/29/2022 after ineffective other weight  loss medication.  She is having to use her arms to pressure self up due to abdominal soreness.  She has used Tylenol with some relief.  No swelling or numbness in her hands.  Review of Systems all systems noncontributory.   Objective: Vital Signs: BP 117/82   Pulse 83   Ht '5\' 5"'$  (1.651 m)   Wt (!) 306 lb (138.8 kg)   BMI 50.92 kg/m   Physical Exam Constitutional:      Appearance: She is well-developed.  HENT:     Head: Normocephalic.     Right Ear: External ear normal.     Left Ear: External ear normal. There is no impacted cerumen.  Eyes:     Pupils: Pupils are equal, round, and reactive to light.  Neck:     Thyroid: No thyromegaly.     Trachea: No tracheal deviation.  Cardiovascular:     Rate and Rhythm: Normal rate.  Pulmonary:     Effort: Pulmonary effort is normal.  Abdominal:     Palpations: Abdomen is soft.  Musculoskeletal:     Cervical back: No rigidity.  Skin:    General: Skin is warm and dry.  Neurological:     Mental Status: She is alert and oriented to person, place,  and time.  Psychiatric:        Behavior: Behavior normal.     Ortho Exam patient uses her arms to push herself up.  Negative drop arm test but positive impingement on the left discomfort with resisted abduction and empty can test.  Sensation hand is intact.  Negative straight leg raising on the left 90 degrees.  Anterior tib EHL gastrocsoleus is strong and symmetrical.  Specialty Comments:  No specialty comments available.  Imaging: No results found.   PMFS History: Patient Active Problem List   Diagnosis Date Noted   Impingement syndrome of left shoulder 05/13/2022   Morbid (severe) obesity due to excess calories ([place]) 04/29/2022   Leukocytosis 01/20/2022   Class 3 obesity with alveolar hypoventilation without serious comorbidity with body mass index (BMI) of 50.0 to 59.9 in adult ([place]) 01/01/2022   Snoring 01/01/2022   Non-restorative sleep 01/01/2022   Chronic fatigue and immune  dysfunction syndrome ([place]) 01/01/2022   High risk medication use 12/02/2021   Otitis externa 11/05/2021   PMR (polymyalgia rheumatica) ([place]) 11/05/2021   Insomnia 05/28/2021   Malignant (primary) neoplasm, unspecified ([place]) 03/08/2021   Personal history of urinary (tract) infections 03/08/2021   Personal history of pneumonia (recurrent) 03/08/2021   Elevated serum hCG 07/02/2020   POTS (postural orthostatic tachycardia syndrome) 03/08/2020   Intermittent claudication ([place]) 01/04/2020   Mixed hyperlipidemia 03/21/2019   Palpitations 03/21/2019   Paroxysmal atrial fibrillation (HCC)    Localized swelling of left lower extremity    Lightheadedness    Essential hypertension    Cellulitis 06/22/2017   Mitral valve regurgitation 06/22/2017   Morbid obesity with body mass index (BMI) of 50.0 to 59.9 in adult [facility]) 12/28/2015   CVID (common variable immunodeficiency) ([place]) 05/07/2015   Post-nasal discharge 03/30/2013   Prediabetes 01/20/2013   History of non-Hodgkin's lymphoma 12/21/2012   Low back pain radiating to right lower extremity 12/10/2012   Right lumbar radiculitis 12/10/2012   Mononucleosis 12/08/1983   Past Medical History:  Diagnosis Date   Abscess    Arthritis    Atrial fibrillation ([place])    Cardiac arrhythmia due to congenital heart disease    trace MR 08/15/20 echo ([place])   CVID (common variable immunodeficiency) ([place]) 2015   Diverticulosis    Elevated cholesterol    GERD (gastroesophageal reflux disease)    High blood pressure    Non Hodgkin's lymphoma ([place]) 1998   Obesity    Personal history of gestational diabetes    PMR (polymyalgia rheumatica) (HCC)    Pneumonia    PONV (postoperative nausea and vomiting)    POTS (postural orthostatic tachycardia syndrome)    Pre-diabetes    UTI (urinary tract infection)     Family History  Problem Relation Age of Onset   Atrial fibrillation Mother    Cancer Father        Colon   Colon cancer Sister     Basal cell carcinoma Brother    Atrial fibrillation Maternal Uncle    Atrial fibrillation Maternal Grandmother    Atrial fibrillation Maternal Grandfather    Heart attack Maternal Grandfather    Ovarian cancer Neg Hx     Past Surgical History:  Procedure Laterality Date   ABDOMINAL HYSTERECTOMY     APPENDECTOMY     CESAREAN SECTION     GALLBLADDER SURGERY     JOINT REPLACEMENT Left    hip   JOINT REPLACEMENT Right    Right Hip 2022   left shoulder repair  LUMBAR LAMINECTOMY N/A 09/18/2021   Procedure: RIGHT LUMBAR FOUR-FIVE MICRODISCECTOMY;  Surgeon: Marybelle Killings, MD;  Location: [facility];  Service: Orthopedics;  Laterality: N/A;   LUNG REMOVAL, PARTIAL Right 2009   VATS, wedge resection partial lobectomy, done at [facility] Bilateral    SPLENECTOMY, TOTAL  1998   TONSILLECTOMY AND ADENOIDECTOMY     UPPER GI ENDOSCOPY N/A 04/29/2022   Procedure: UPPER GI ENDOSCOPY;  Surgeon: Kinsinger, Arta Bruce, MD;  Location: [facility];  Service: General;  Laterality: N/A;   Social History   Occupational History   Occupation: rn  Tobacco Use   Smoking status: Former    Packs/day: 0.50    Years: 20.00    Total pack years: 10.00    Types: Cigarettes    Quit date: 09/29/2002    Years since quitting: 19.6   Smokeless tobacco: Never  Vaping Use   Vaping Use: Never used  Substance and Sexual Activity   Alcohol use: No   Drug use: No   Sexual activity: Yes    Birth control/protection: Surgical, Post-menopausal    Comment: 1st intercourse 75 yo-5 partners

## 2022-05-15 NOTE — Progress Notes (Signed)
Office Visit Note  Patient: Monique Clark             Date of Birth: 05-08-1966           MRN: [id number]             PCP: Hoyt Koch, MD Referring: Hoyt Koch, * Visit Date: 05/20/2022   Subjective:  Follow-up (Inflammatory issues, rashes, tendonitis, joint pain (L shoulder and elbow), and swelling.)   History of Present Illness: Monique Clark is a 56 y.o. female here for follow up for PMR on methotrexate 15 mg p.o. weekly.  Since her last visit she underwent gastric sleeve resection surgery for weight reduction.  This has still been in the recovery process.  She discontinued the prednisone perioperatively and has been off this.  She notices multiple symptoms are worse with joint pain and swelling especially in shoulders and elbows.  Not as much lower extremity swelling.  Previous HPI 01/06/2022 Monique Clark is a 56 y.o. female here for follow up for PMR on prednisone 10 mg daily and started methotrexate 10 mg PO weekly last month due to persistent inflammatory lab elevations.  She continues having fatigue is persistent.  Shortness of breath with walking and exertion is slightly improved.  She has not noticed any side effects or intolerance to methotrexate.  She is continuing to try to work on her exercise as a part of preparation for bariatric surgery also awaiting upcoming cardiac evaluation.     Previous HPI 12/02/21 Monique Clark is a 56 y.o. female here for muscle pain and weakness in shoulders and legs concern for history of PMR previously treated with long term prednisone.  She has a history of CVID on IVIG, non hodgkins lymphoma, also has history of bilateral hip replacement. Recently had microdiscectomy about 6 weeks prior. Started prednisone 40 mg daily last month decreased to 20 mg daily and now is down to 10 mg dose. She felt her symptoms improve about 80-90% when starting prednisone again and tapering has been okay, only slight increase with this  dose reduction. She does not see any particular swelling, discoloration, or rashes. She has noticed some hand tremor more when using her hands that is not typical for her.   Review of Systems  Constitutional:  Positive for fatigue.  HENT:  Positive for mouth dryness. Negative for mouth sores.   Eyes:  Negative for dryness.  Respiratory:  Negative for shortness of breath.   Cardiovascular:  Negative for chest pain and palpitations.  Gastrointestinal:  Positive for constipation. Negative for blood in stool and diarrhea.  Endocrine: Negative for increased urination.  Genitourinary:  Negative for involuntary urination.  Musculoskeletal:  Positive for joint pain, gait problem, joint pain, joint swelling, myalgias, muscle weakness, morning stiffness, muscle tenderness and myalgias.  Skin:  Positive for rash. Negative for color change, hair loss and sensitivity to sunlight.  Allergic/Immunologic: Positive for susceptible to infections.  Neurological:  Positive for headaches. Negative for dizziness.  Hematological:  Negative for swollen glands.  Psychiatric/Behavioral:  Positive for sleep disturbance. Negative for depressed mood. The patient is not nervous/anxious.     PMFS History:  Patient Active Problem List   Diagnosis Date Noted   OSA on CPAP 05/19/2022   Impingement syndrome of left shoulder 05/13/2022   Morbid (severe) obesity due to excess calories ([place]) 04/29/2022   Leukocytosis 01/20/2022   Class 3 obesity with alveolar hypoventilation without serious comorbidity with body mass index (BMI) of 50.0 to 59.9 in  adult ([place]) 01/01/2022   Snoring 01/01/2022   Non-restorative sleep 01/01/2022   Chronic fatigue and immune dysfunction syndrome ([place]) 01/01/2022   High risk medication use 12/02/2021   Otitis externa 11/05/2021   PMR (polymyalgia rheumatica) ([place]) 11/05/2021   Insomnia 05/28/2021   Malignant (primary) neoplasm, unspecified ([place]) 03/08/2021   Personal history of urinary  (tract) infections 03/08/2021   Personal history of pneumonia (recurrent) 03/08/2021   Elevated serum hCG 07/02/2020   POTS (postural orthostatic tachycardia syndrome) 03/08/2020   Intermittent claudication ([place]) 01/04/2020   Mixed hyperlipidemia 03/21/2019   Palpitations 03/21/2019   Paroxysmal atrial fibrillation (HCC)    Localized swelling of left lower extremity    Lightheadedness    Essential hypertension    Cellulitis 06/22/2017   Mitral valve regurgitation 06/22/2017   Morbid obesity with body mass index (BMI) of 50.0 to 59.9 in adult [facility]) 12/28/2015   CVID (common variable immunodeficiency) ([place]) 05/07/2015   Post-nasal discharge 03/30/2013   Prediabetes 01/20/2013   History of non-Hodgkin's lymphoma 12/21/2012   Low back pain radiating to right lower extremity 12/10/2012   Right lumbar radiculitis 12/10/2012   Mononucleosis 12/08/1983    Past Medical History:  Diagnosis Date   Abscess    Arthritis    Atrial fibrillation ([place])    Cardiac arrhythmia due to congenital heart disease    trace MR 08/15/20 echo ([place])   CVID (common variable immunodeficiency) ([place]) 2015   Diverticulosis    Elevated cholesterol    GERD (gastroesophageal reflux disease)    High blood pressure    Non Hodgkin's lymphoma ([place]) 1998   Obesity    Personal history of gestational diabetes    PMR (polymyalgia rheumatica) (HCC)    Pneumonia    PONV (postoperative nausea and vomiting)    POTS (postural orthostatic tachycardia syndrome)    Pre-diabetes    UTI (urinary tract infection)     Family History  Problem Relation Age of Onset   Atrial fibrillation Mother    Cancer Father        Colon   Colon cancer Sister    Basal cell carcinoma Brother    Atrial fibrillation Maternal Uncle    Atrial fibrillation Maternal Grandmother    Atrial fibrillation Maternal Grandfather    Heart attack Maternal Grandfather    Ovarian cancer Neg Hx    Past Surgical History:  Procedure  Laterality Date   ABDOMINAL HYSTERECTOMY     APPENDECTOMY     CESAREAN SECTION     GALLBLADDER SURGERY     JOINT REPLACEMENT Left    hip   JOINT REPLACEMENT Right    Right Hip 2022   LAPAROSCOPIC GASTRIC SLEEVE RESECTION  04/29/2022   left shoulder repair     LUMBAR LAMINECTOMY N/A 09/18/2021   Procedure: RIGHT LUMBAR FOUR-FIVE MICRODISCECTOMY;  Surgeon: Marybelle Killings, MD;  Location: [facility];  Service: Orthopedics;  Laterality: N/A;   LUNG REMOVAL, PARTIAL Right 2009   VATS, wedge resection partial lobectomy, done at [facility] Bilateral    SPLENECTOMY, TOTAL  1998   TONSILLECTOMY AND ADENOIDECTOMY     UPPER GI ENDOSCOPY N/A 04/29/2022   Procedure: UPPER GI ENDOSCOPY;  Surgeon: Kieth Brightly, Arta Bruce, MD;  [facility];  Service: General;  Laterality: N/A;   Social History   Social History Narrative   Not on file   Immunization History  Administered Date(s) Administered   Influenza-Unspecified 07/27/2020   PFIZER(Purple Top)SARS-COV-2 Vaccination 09/24/2019, 10/12/2019  Objective: Vital Signs: BP 122/82 (BP Location: Right Arm, Patient Position: Sitting, Cuff Size: Large)   Pulse 62   Resp 15   Ht '5\' 5"'$  (1.651 m)   Wt (!) 312 lb 3.2 oz (141.6 kg)   BMI 51.95 kg/m    Physical Exam Constitutional:      Appearance: She is obese.  Cardiovascular:     Rate and Rhythm: Normal rate and regular rhythm.  Pulmonary:     Effort: Pulmonary effort is normal.     Breath sounds: Normal breath sounds.  Musculoskeletal:     Right lower leg: No edema.     Left lower leg: No edema.  Skin:    General: Skin is warm and dry.  Neurological:     Mental Status: She is alert.  Psychiatric:        Mood and Affect: Mood normal.      Musculoskeletal Exam:  Neck full ROM no tenderness Shoulders full ROM significant stiffness with overhead abduction and pain with resisted range of motion, no palpable swelling Elbows full ROM tenderness to pressure no palpable  effusions Wrists full ROM no tenderness or swelling Fingers full ROM no tenderness or swelling Knees full ROM no tenderness or swelling  Investigation: No additional findings.  Imaging: No results found.  Recent Labs: Lab Results  Component Value Date   WBC 13.1 (H) 05/20/2022   HGB 12.6 05/20/2022   PLT 391 05/20/2022   NA 138 05/20/2022   K 4.5 05/20/2022   CL 101 05/20/2022   CO2 26 05/20/2022   GLUCOSE 118 (H) 05/20/2022   BUN 15 05/20/2022   CREATININE 0.78 05/20/2022   BILITOT 0.3 05/20/2022   ALKPHOS 68 04/16/2022   AST 21 05/20/2022   ALT 13 05/20/2022   PROT 6.5 05/20/2022   ALBUMIN 3.4 (L) 04/16/2022   CALCIUM 10.0 05/20/2022   GFRAA >60 03/18/2020    Speciality Comments: No specialty comments available.  Procedures:  No procedures performed Allergies: Tetanus-diphtheria toxoids td, Nizatidine, Promethazine, and Tetanus toxoids   Assessment / Plan:     Visit Diagnoses: PMR (polymyalgia rheumatica) ([place]) - Plan: Sedimentation rate, C-reactive protein  She is having somewhat generalized symptom increase though also with large proximal joint pain and stiffness.  Not sure how much is related to inflammation from recent surgery, discontinuation of steroids, or return of disease activity.  Checking sed rate and CRP.  Plan to continue the methotrexate 15 mg p.o. weekly.  Would like to hold off restarting systemic steroids if possible due to recent surgery and recovery.  High risk medication use - methotrexate 15 mg weekly - Plan: CBC with Differential/Platelet, COMPLETE METABOLIC PANEL WITH GFR  Checking CBC and CMP for methotrexate toxicity monitoring.  Impingement syndrome of left shoulder  Shoulder pain with some radiation into the left arm suggestive for impingement syndrome.  No gross deficit in strength or movement though.  This could be from return of bursitis or tendinitis at that area could consider local steroid injection if symptoms not  improving.  Orders: Orders Placed This Encounter  Procedures   Sedimentation rate   C-reactive protein   CBC with Differential/Platelet   COMPLETE METABOLIC PANEL WITH GFR   No orders of the defined types were placed in this encounter.    Follow-Up Instructions: Return in about 3 months (around 08/20/2022) for PMR on MTX f/u 65mo.   CCollier Salina MD  Note - This record has been created using [employer]  Chart creation errors  have been sought, but may not always  have been located. Such creation errors do not reflect on  the standard of medical care.

## 2022-05-19 ENCOUNTER — Ambulatory Visit: Payer: 59 | Admitting: Family Medicine

## 2022-05-19 ENCOUNTER — Encounter: Payer: Self-pay | Admitting: Neurology

## 2022-05-19 ENCOUNTER — Other Ambulatory Visit: Payer: Self-pay

## 2022-05-19 ENCOUNTER — Ambulatory Visit: Payer: 59 | Admitting: Neurology

## 2022-05-19 ENCOUNTER — Other Ambulatory Visit (HOSPITAL_COMMUNITY): Payer: Self-pay

## 2022-05-19 ENCOUNTER — Telehealth: Payer: Self-pay | Admitting: Dietician

## 2022-05-19 VITALS — BP 123/84 | HR 62 | Ht 65.0 in | Wt 310.5 lb

## 2022-05-19 DIAGNOSIS — G9332 Myalgic encephalomyelitis/chronic fatigue syndrome: Secondary | ICD-10-CM

## 2022-05-19 DIAGNOSIS — D8989 Other specified disorders involving the immune mechanism, not elsewhere classified: Secondary | ICD-10-CM | POA: Diagnosis not present

## 2022-05-19 DIAGNOSIS — Z9989 Dependence on other enabling machines and devices: Secondary | ICD-10-CM | POA: Diagnosis not present

## 2022-05-19 DIAGNOSIS — G4733 Obstructive sleep apnea (adult) (pediatric): Secondary | ICD-10-CM | POA: Diagnosis not present

## 2022-05-19 DIAGNOSIS — E662 Morbid (severe) obesity with alveolar hypoventilation: Secondary | ICD-10-CM

## 2022-05-19 DIAGNOSIS — I480 Paroxysmal atrial fibrillation: Secondary | ICD-10-CM | POA: Diagnosis not present

## 2022-05-19 NOTE — Patient Instructions (Signed)
KEEPING IT CLEAN: CPAP HYGIENE PROPER UPKEEP OF YOUR CPAP MACHINE CAN HELP ENSURE THE DEVICE FUNCTIONS PROPERLY CPAP CLEANING INSTRUCTIONS Along with proper CPAP cleaning it is recommended that you replace your mask, tubing and filters once very 3 months and more frequently if you are sick.   DAILY CLEANING Do not use moisturizing soaps, bleach, scented oils, chlorine, or alcohol-based solutions to clean your supplies. These solutions may cause irritation to your skin and lungs and may reduce the life of your products. [employer] or Comparable works best for daily cleaning.  **If you've been sick, it's smart to wash your mask, tubing, humidifier and filter daily until your cold, flu or virus symptoms are gone. That can help reduce the amount of time you spend under the weather.  Before using your mask -wash your face daily with soap and water to remove excess facial oils. Wipe down your mask (including areas that come in contact with your skin) using a damp towel with soap and warm water. This will remove any oils, dead skin cells, and sweat on the mask that can affect the quality of the seal. Gently rinse with a clean towel and let the mask air-dry out of direct sunlight. You can also use unscented baby wipes or pre-moistened towels designed specifically for cleaning CPAP masks, which are available on-line. DO NOT USE CLOROX OR DISINFECTING WIPES. If your unit has a humidifier, empty any leftover water instead of letting in sit in the unit all day. Refill the humidifier with clean, distilled water right before bedtime for optimal use WEEKLY (OR MORE FREQUENT) CLEANING Your mask and tubing need a full bath at least once a week to keep it free of dust, bacteria, and germs. (During COVID-19 or any other flu/virus we recommend more frequent cleaning) Clean the CPAP tubing, nasal mask, and headgear in a bathroom sink filled with warm water and a few drops of ammonia-free, mild dish detergent. Avoid  using stronger cleaning products, as they may damage the mask or leave harmful residue. Swirl all parts around for about five minutes, rinse well and let air dry during the day. Hang the tubing over the shower rod, on a towel rack or in the laundry room to ensure all the water drips out. The mask and headgear can be air-dried on a towel or hung on a hook or hanger. You should also wipe down your CPAP machine with a damp cloth. Ensure the unit is unplugged. The towel shouldn't be too damp or wet, as water could get into the machine. Clean the filter by removing it and rinsing it in warm tap water. Run it under the water and squeeze to make sure there is no dust. Then blot down the filter with a towel. Do not wash your machine's white filter, if one is present--those are disposable and should be replaced every two weeks. If you are recovering from being sick, we recommend changing the filter sooner. If your CPAP has a humidifier, that also needs to be cleaned weekly. Empty any remaining water and then wash the water chamber in the sink with warm soapy water. Rinse well and drain out as much of the water as possible. Let the chamber air-dry before placing it back into the CPAP unit. Every other week you should disinfect the humidifier. Do that by soaking it in a solution of one-part vinegar to five parts water for 30 minutes, thoroughly rinsing and then placing in your dishwasher's top rack for washing. And keep  it clean by using only distilled water to prevent mineral deposits that can build up and cause damage to your machine. IMPORTANT TIPS Make caring for your CPAP equipment part of your morning routine. Keep machine and accessories out of direct sunlight to avoid damaging them. Never use bleach to clean accessories. Place machine on a level surface and away from curtains that may interfere with the air intake. Keep track of when you should order replacement parts for your mask and accessories so that  you always get the most out of your CPAP. You can also sign up for [employer] by contacting our DME department at [email or web address] **The following are examples of soap that may be used: [employer], [place] soap (plain).  With a little upkeep, your CPAP can continue to help you breathe better for a long time. Just a few minutes a day can help keep your CPAP running efficiently for years to come.  If you have a CPAP, but are struggling with compliance, check out our no mask oral appliance, for those with mild to moderate sleep apnea.  Call and schedule a consultation with your DME to help with cleaning question.

## 2022-05-19 NOTE — Progress Notes (Signed)
SLEEP MEDICINE CLINIC    Provider:  Larey Seat, MD  Primary Care Physician:  Monique Koch, MD [place] [address]     Referring Provider: Hoyt Clark, Monique Clark,  Monique Clark [id number]          Chief Complaint according to patient   Patient presents with:     New Patient (Initial Visit)     Pt alone, rm 11. New on CPAP      HISTORY OF PRESENT ILLNESS:  Monique Clark is a 56 y.o. Caucasian female patient seen here as a referral on 05/19/2022 . She underwent an in lab SPLIT night study and this resulted in her being titrated to CPAP.  The study took place on 18 Feb 2022, the patient had endorsed the Epworth Sleepiness Scale at 12 out of 24 points before she slept 2 hours with an AHI of 37/h and reached split night protocol.  She did not sleep in supine.  Oxygen nadir was 86% saturation there were 21 minutes of low oxygen saturation was in 120 minutes of baseline study.  No significant PLM's.  There were some spontaneous arousals the patient was titrated with a small P10 nasal pillow and was able to achieve a reduction of her apnea-hypopnea index to 3.4/h while being on only 5 cmH2O.  She is presenting here today about 3 weeks post gastric sleeve surgery she has been 97% compliant by days and 86% compliant by hours with an average use of 6 hours 13 minutes at night the auto titration CPAP machine is set between 5 and 10 cm water with 3 cm EPR and her 95th percentile pressure is 8.7/h the residual AHI is 1.4/h which is a good resolution.  There is no significant air leakage noted.  There are no central apneas emerging either.  So overall this looks like the patient is able to reduce her apnea index very well she has also already lost about 20 pounds over the last 3 weeks which will have helped I see a trend on her curve of AHI that is clearly going downwards.  Her Epworth sleepiness score was still endorsed slightly elevated today  at 11 out of 24 points.  She did not endorse the fatigue severity score.  The patient preferred to sleep prone but she cannot do with a nasal pillow.  I reviewed all her sleep study data the patient was able to reach REM sleep right after CPAP was initiated and able to maintain REM sleep for almost 60 minutes.  She feels less fatigued, but sleepiness has been the same.  Nocturia 1-2 times at night as before.  No need to continue Ambien as a sleep aid- she had been on it before seeing me.  Prescriber is PCP, Monique Clark. .       Consultation requested from Monique Clark for a pre bariatric surgery evaluation of sleep apnea. .  Chief concern according to patient :  Pt alone, rm 11. Presents today for sleep consult eval to prepare for gastric bypass surgery. She had a SS 10+ years ago and It was negative for OSA. She only sleeps prone, She avg about 7/8 hrs of sleep but broken. Wakes frequently tossing and turning and bathroom. Able to fall back asleep easily. C/o fatigue during the daytime.    Monique Clark  has a past medical history of Abscess, Arthritis, Atrial fibrillation ([place]), atrial fibrillation, paroxysmal. , CVID (common variable immunodeficiency) (  [place]) (2015), Diverticulosis, Elevated cholesterol, GERD (gastroesophageal reflux disease), High blood pressure, History of Non Hodgkin's lymphoma ([place]) (1998), Splenectomy, Obesity, history of gestational diabetes, PMR (polymyalgia rheumatica) ([facility]), Pneumonia, PONV (postoperative nausea and vomiting), POTS (postural orthostatic tachycardia syndrome), Pre-diabetes, and UTI (urinary tract infection). She reports fatigue, myalgia, is on prednisone.  1) excessive daytime sleepiness , excessive fatigue  2) non restorative sleep, morning headaches, dry mouth in AM .  3) Snoring witnessed by husband, while sleeping prone.  4) prone sleeper, unable to tolerate supine sleep.  5) atrial fibrillation, paroxysmal, on flecainide.     Sleep relevant  medical history: Nocturia 2 ,Tonsillectomy and adenoid ectomy, ear tubes, sinusitis, allergies, pneumonia.   Family medical /sleep history: Mother on CPAP with OSA.    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 The patient currently daytime works/ used to work in shifts( night/ rotating,) until 4 years ago.   Tobacco use: 2004.  ETOH use : rare,  Caffeine intake in form of Coffee( 1 cup in AM ) Soda( 1 coke a day ) Tea (  1 glass or cup a day seasonal) or energy drinks.    Sleep habits are as follows: The patient's dinner time is between 5 PM. The patient goes to bed at 10 PM and continues to sleep for 7-8 hours, wakes for 1-2 bathroom breaks,.   The preferred sleep position is prone , with the support of 2 pillows.  Dreams are reportedly frequent/vivid.  6  AM is the usual rise time. The patient wakes up spontaneously./ She reports not feeling refreshed or restored in AM, with symptoms such as dry mouth, morning headaches, temporal headaches, stiffness, sore, and residual fatigue.  Naps are taken infrequently.   Review of Systems: Out of a complete 14 system review, the patient complains of only the following symptoms, and all other reviewed systems are negative.:  Fatigue, sleepiness , snoring, fragmented sleep, Insomnia treated with ambien.    How likely are you to doze in the following situations: 0 = not likely, 1 = slight chance, 2 = moderate chance, 3 = high chance   Sitting and Reading? Watching Television? Sitting inactive in a public place (theater or meeting)? As a passenger in a car for an hour without a break? Lying down in the afternoon when circumstances permit? Sitting and talking to someone? Sitting quietly after lunch without alcohol? In a car, while stopped for a few minutes in traffic?   Total = 11/ 24 points  from 12/24 points pre CPAP and pre gastric sleeve.   FSS endorsed had been endorsed at  57/ 63 points - pre CPAP , was not filled out today   Rheumatologist Monique. Benjamine Mola. Will see her tomorrow on 05-20-2022.   Her Epworth sleepiness score was still endorsed slightly elevated today at 11 out of 24 points.  She did not endorse the fatigue severity score.  The patient preferred to sleep prone but she cannot do with a nasal pillow.  I reviewed all her sleep study data the patient was able to reach REM sleep right after CPAP was initiated and able to maintain REM sleep for almost 60 minutes.  She feels less fatigued, but sleepiness has been the same.  Nocturia 1-2 times at night as before.  No need to continue Ambien as a  sleep aid- she had been on it before seeing me.  Prescriber is PCP, Monique Clark. .    Social History   Socioeconomic History   Marital status: Married    Spouse name: Not on file   Number of children: 3   Years of education: Not on file   Highest education level: Not on file  Occupational History   Occupation: rn  Tobacco Use   Smoking status: Former    Packs/day: 0.50    Years: 20.00    Total pack years: 10.00    Types: Cigarettes    Quit date: 09/29/2002    Years since quitting: 19.6   Smokeless tobacco: Never  Vaping Use   Vaping Use: Never used  Substance and Sexual Activity   Alcohol use: No   Drug use: No   Sexual activity: Yes    Birth control/protection: Surgical, Post-menopausal    Comment: 1st intercourse 53 yo-5 partners  Other Topics Concern   Not on file  Social History Narrative   Not on file   Social Determinants of Health   Financial Resource Strain: Low Risk  (03/14/2019)   Overall Financial Resource Strain (CARDIA)    Difficulty of Paying Living Expenses: Not hard at all  Food Insecurity: No Food Insecurity (03/14/2019)   Hunger Vital Sign    Worried About Running Out of Food in the Last Year: Never true    [place] in the Last Year:  Never true  Transportation Needs: No Transportation Needs (03/14/2019)   PRAPARE - Hydrologist (Medical): No    Lack of Transportation (Non-Medical): No  Physical Activity: Inactive (03/14/2019)   Exercise Vital Sign    Days of Exercise per Week: 0 days    Minutes of Exercise per Session: 0 min  Stress: No Stress Concern Present (03/14/2019)   [place]    Feeling of Stress : Only a little  Social Connections: Moderately Integrated (03/14/2019)   Social Connection and Isolation Panel [NHANES]    Frequency of Communication with Friends and Family: Three times a week    Frequency of Social Gatherings with Friends and Family: Three times a week    Attends Religious Services: 1 to 4 times per year    Active Member of Clubs or Organizations: No    Attends Archivist Meetings: Never    Marital Status: Married    Family History  Problem Relation Age of Onset   Atrial fibrillation Mother    Cancer Father        Colon   Colon cancer Sister    Basal cell carcinoma Brother    Atrial fibrillation Maternal Uncle    Atrial fibrillation Maternal Grandmother    Atrial fibrillation Maternal Grandfather    Heart attack Maternal Grandfather    Ovarian cancer Neg Hx     Past Medical History:  Diagnosis Date   Abscess    Arthritis    Atrial fibrillation ([place])    Cardiac arrhythmia due to congenital heart disease    trace MR 08/15/20 echo ([place])   CVID (common variable immunodeficiency) ([facility]) 2015   Diverticulosis    Elevated cholesterol    GERD (gastroesophageal reflux disease)    High blood pressure    Non Hodgkin's lymphoma ([facility]) 1998   Obesity    Personal history of gestational diabetes    PMR (polymyalgia rheumatica) ([facility])    Pneumonia    PONV (  postoperative nausea and vomiting)    POTS (postural orthostatic tachycardia syndrome)    Pre-diabetes    UTI (urinary  tract infection)     Past Surgical History:  Procedure Laterality Date   ABDOMINAL HYSTERECTOMY     APPENDECTOMY     CESAREAN SECTION     GALLBLADDER SURGERY     JOINT REPLACEMENT Left    hip   JOINT REPLACEMENT Right    Right Hip 2022   left shoulder repair     LUMBAR LAMINECTOMY N/A 09/18/2021   Procedure: RIGHT LUMBAR FOUR-FIVE MICRODISCECTOMY;  Surgeon: Marybelle Killings, MD;  Location: [facility];  Service: Orthopedics;  Laterality: N/A;   LUNG REMOVAL, PARTIAL Right 2009   VATS, wedge resection partial lobectomy, done at [facility] Bilateral    SPLENECTOMY, TOTAL  1998   TONSILLECTOMY AND ADENOIDECTOMY     UPPER GI ENDOSCOPY N/A 04/29/2022   Procedure: UPPER GI ENDOSCOPY;  Surgeon: Mickeal Skinner, MD;  Location: [facility];  Service: General;  Laterality: N/A;     Current Outpatient Medications on File Prior to Visit  Medication Sig Dispense Refill   acetaminophen (TYLENOL) 325 MG tablet Take 650 mg by mouth every 6 (six) hours as needed for moderate pain.     albuterol (VENTOLIN HFA) 108 (90 Base) MCG/ACT inhaler Inhale 2 puffs into the lungs every 6 (six) hours as needed for shortness of breath or wheezing.     calcium carbonate (TUMS) 500 MG chewable tablet Chew 1 tablet by mouth 3 (three) times daily.     cetirizine (ZYRTEC) 10 MG tablet Take 1 tablet (10 mg total) by mouth daily. (Patient taking differently: Take 10 mg by mouth at bedtime.) 30 tablet 0   Cholecalciferol (VITAMIN D3) 1.25 MG (50000 UT) CAPS TAKE 1 CAPSULE BY MOUTH TWICE A WEEK AS DIRECTED 8 capsule 5   diphenhydrAMINE (BENADRYL) 25 MG tablet Take 25-50 mg by mouth See admin instructions. Take 25 mg by mouth as needed for allergies or itching and take 50 mg by mouth with Hizentra infusion     DULoxetine (CYMBALTA) 60 MG capsule Take 1 capsule (60 mg total) by mouth daily. 90 capsule 3   famotidine (PEPCID) 20 MG tablet Take 20 mg by mouth every evening.     flecainide (TAMBOCOR) 100 MG tablet  Take 1 tablet by mouth every 12 hours 180 tablet 3   fluticasone (FLONASE) 50 MCG/ACT nasal spray Place 2 sprays into both nostrils daily as needed for allergies.     folic acid (FOLVITE) 1 MG tablet Take 1 tablet (1 mg total) by mouth daily. 90 tablet 0   Immune Globulin, Human, 4 GM/20ML SOLN Inject 36 g into the skin every 14 (fourteen) days. Monday     methocarbamol (ROBAXIN) 500 MG tablet Take 1 tablet (500 mg total) by mouth every 6 (six) hours as needed for muscle spasms. 60 tablet 0   methotrexate (RHEUMATREX) 2.5 MG tablet Take 6 tablets (15 mg total) by mouth once a week. Caution:Chemotherapy. Protect from light. 30 tablet 0   montelukast (SINGULAIR) 10 MG tablet Take 1 tablet (10 mg total) by mouth at bedtime. 30 tablet 0   Multiple Vitamin (MULTIVITAMIN WITH MINERALS) TABS tablet Take 1 tablet by mouth daily.     Multiple Vitamins-Minerals (CELEBRATE MULTI-COMPLETE 60 PO) Take by mouth daily.     nebivolol (BYSTOLIC) 5 MG tablet Take 1 tablet (5 mg total) by mouth daily. 30 tablet 4   NON FORMULARY  Pt uses a cpap nightly     nystatin-triamcinolone ointment (MYCOLOG) Apply 1 application topically 2 (two) times daily. 30 g 0   ondansetron (ZOFRAN-ODT) 4 MG disintegrating tablet Dissolve 1 tablet (4 mg total) by mouth every 6 (six) hours as needed for nausea or vomiting. 20 tablet 0   rivaroxaban (XARELTO) 20 MG TABS tablet Take 1 tablet by mouth every day 90 tablet 4   zolpidem (AMBIEN) 5 MG tablet Take 1 tablet by mouth at bedtime as needed for sleep. 30 tablet 5   No current facility-administered medications on file prior to visit.    Allergies  Allergen Reactions   Tetanus-Diphtheria Toxoids Td Swelling    angioedema   Nizatidine Hives    Axid   Promethazine Other (See Comments)    High doses causes confusion   Tetanus Toxoids Other (See Comments)    angioedema    Physical exam:  Today's Vitals   05/19/22 1407  BP: 123/84  Pulse: 62  Weight: (!) 310 lb 8 oz (140.8  kg)  Height: '5\' 5"'$  (1.651 m)   Body mass index is 51.67 kg/m.   Wt Readings from Last 3 Encounters:  05/19/22 (!) 310 lb 8 oz (140.8 kg)  05/13/22 (!) 308 lb (139.7 kg)  05/13/22 (!) 306 lb (138.8 kg)     Ht Readings from Last 3 Encounters:  05/19/22 '5\' 5"'$  (1.651 m)  05/13/22 '5\' 5"'$  (1.651 m)  05/13/22 '5\' 5"'$  (1.651 m)      General: The patient is awake, alert and appears not in acute distress. The patient is well groomed. Head: Normocephalic, atraumatic. Neck is supple.  Mallampati 2,  neck circumference:17 inches . Nasal airflow patent.    Retrognathia is not seen.  Dental status: biological  Cardiovascular:  Regular rate and cardiac rhythm by pulse,  without distended neck veins. Respiratory: Lungs are clear to auscultation.  Skin:  With evidence of ankle edema, left more than right Trunk: The patient's posture is erect..  The patient has very large breasts.   Neurologic exam : The patient is awake and alert, oriented to place and time.   Memory subjective described as intact.  Attention span & concentration ability appears normal.  Speech is fluent,  without  dysarthria, dysphonia or aphasia.  Mood and affect are appropriate.   Cranial nerves: no loss of smell or taste reported  Pupils are equal and briskly reactive to light. Funduscopic exam deferred.  Extraocular movements in vertical and horizontal planes were intact and without nystagmus. No Diplopia. Visual fields by finger perimetry are intact. Hearing was intact to soft voice and finger rubbing.    Facial sensation intact to fine touch. Facial motor strength is symmetric and tongue and uvula move midline.  Neck ROM : rotation, tilt and flexion extension were normal for age and shoulder shrug was symmetrical.    Motor exam:  Symmetric bulk, tone and ROM.   Normal tone without cog -wheeling, symmetric grip strength .  Coordination: The Finger-to-nose maneuver was intact without evidence of ataxia, dysmetria. She  noted a fine tremor in bothh hands at action. No change in handwriting  Gait and station: status post bilateral hip surgery, back surgery.  Patient could rise unassisted from a seated position, walked without assistive device.  Toe and heel walk were deferred.  Deep tendon reflexes: in the upper and lower extremities are symmetric and intact, 2 plus. .  Babinski response was deferred .       After spending a  total time of 20 minutes face to face and additional time for physical and neurologic examination, review of laboratory studies,  personal review of imaging studies, reports and results of other testing and review of referral information / records as far as provided in visit, I have established the following assessments:  Overall assessment is that of a patient who has struggled with autoimmune disorders polymyalgia, and had fairly recently back surgery and two hip replacements the first in 2019 the left in 2022. She presents with normal reflexes normal sensory exam.  Her high level of fatigue could well be related to her autoimmune condition.  She also feels that she does not sleep deep enough restorative and refreshing enough.  She is a restless sleeper partially due to discomfort.  She avoids sleeping on her back and prefers a prone position because of her body habitus and large breasts.  She was seen here in preparation for bariatric surgery, had a gastric sleeve procedure 04-29-2022.    Current BMI is 51.64.she lost 20 plus pounds already,  She is compliant with CPAP, her AHI is significantly reduced , her Epworth score only went down by one point.  She noticed a reduction in palpitations and flutters since being on CPAP.      My Plan is to proceed with: Rv with Np in 10- 12 months when weight loss will have reached a plateau. I will not fill ambien for this patient    I would like to thank Monique Koch, MD and for allowing me to meet with and to take care of this pleasant  patient.    CC: I will share my notes with PCP as well .  Electronically signed by: Monique Seat, MD 05/19/2022 2:11 PM  [facility] Board certified by The [employer] of Sleep Medicine and Diplomate of the [employer] of Sleep Medicine. Board certified In Neurology through the [place], Fellow of the [employer] of Neurology. Medical Director of [facility].

## 2022-05-19 NOTE — Telephone Encounter (Signed)
RD called pt to verify fluid intake once starting soft, solid proteins 2 week post-bariatric surgery.   Daily Fluid intake: 64 oz Daily Protein intake: 60 grams Bowel Habits: every few days; currently using milk of magnesia.  Pt states she doesn't feel like she needs to go.  Pt states she is not concerned at this time.  Concerns/issues:

## 2022-05-20 ENCOUNTER — Encounter: Payer: Self-pay | Admitting: Internal Medicine

## 2022-05-20 ENCOUNTER — Ambulatory Visit: Payer: 59 | Attending: Internal Medicine | Admitting: Internal Medicine

## 2022-05-20 VITALS — BP 122/82 | HR 62 | Resp 15 | Ht 65.0 in | Wt 312.2 lb

## 2022-05-20 DIAGNOSIS — M353 Polymyalgia rheumatica: Secondary | ICD-10-CM | POA: Diagnosis not present

## 2022-05-20 DIAGNOSIS — Z79899 Other long term (current) drug therapy: Secondary | ICD-10-CM | POA: Diagnosis not present

## 2022-05-20 DIAGNOSIS — M7542 Impingement syndrome of left shoulder: Secondary | ICD-10-CM | POA: Diagnosis not present

## 2022-05-21 DIAGNOSIS — D801 Nonfamilial hypogammaglobulinemia: Secondary | ICD-10-CM | POA: Diagnosis not present

## 2022-05-21 LAB — COMPLETE METABOLIC PANEL WITH GFR
AG Ratio: 1.3 (calc) (ref 1.0–2.5)
ALT: 13 U/L (ref 6–29)
AST: 21 U/L (ref 10–35)
Albumin: 3.7 g/dL (ref 3.6–5.1)
Alkaline phosphatase (APISO): 68 U/L (ref 37–153)
BUN: 15 mg/dL (ref 7–25)
CO2: 26 mmol/L (ref 20–32)
Calcium: 10 mg/dL (ref 8.6–10.4)
Chloride: 101 mmol/L (ref 98–110)
Creat: 0.78 mg/dL (ref 0.50–1.03)
Globulin: 2.8 g/dL (calc) (ref 1.9–3.7)
Glucose, Bld: 118 mg/dL — ABNORMAL HIGH (ref 65–99)
Potassium: 4.5 mmol/L (ref 3.5–5.3)
Sodium: 138 mmol/L (ref 135–146)
Total Bilirubin: 0.3 mg/dL (ref 0.2–1.2)
Total Protein: 6.5 g/dL (ref 6.1–8.1)
eGFR: 89 mL/min/{1.73_m2} (ref >=60)

## 2022-05-21 LAB — SEDIMENTATION RATE: Sed Rate: 119 mm/h — ABNORMAL HIGH (ref 0–30)

## 2022-05-21 LAB — CBC WITH DIFFERENTIAL/PLATELET
Absolute Monocytes: 707 cells/uL (ref 200–950)
Basophils Absolute: 26 cells/uL (ref 0–200)
Basophils Relative: 0.2 %
Eosinophils Absolute: 0 cells/uL — ABNORMAL LOW (ref 15–500)
Eosinophils Relative: 0 %
HCT: 38.7 % (ref 35.0–45.0)
Hemoglobin: 12.6 g/dL (ref 11.7–15.5)
Lymphs Abs: 1415 cells/uL (ref 850–3900)
MCH: 29.3 pg (ref 27.0–33.0)
MCHC: 32.6 g/dL (ref 32.0–36.0)
MCV: 90 fL (ref 80.0–100.0)
MPV: 13.2 fL — ABNORMAL HIGH (ref 7.5–12.5)
Monocytes Relative: 5.4 %
Neutro Abs: 10952 cells/uL — ABNORMAL HIGH (ref 1500–7800)
Neutrophils Relative %: 83.6 %
Platelets: 391 10*3/uL (ref 140–400)
RBC: 4.3 10*6/uL (ref 3.80–5.10)
RDW: 15.3 % — ABNORMAL HIGH (ref 11.0–15.0)
Total Lymphocyte: 10.8 %
WBC: 13.1 10*3/uL — ABNORMAL HIGH (ref 3.8–10.8)

## 2022-05-21 LAB — C-REACTIVE PROTEIN: CRP: 51.4 mg/L — ABNORMAL HIGH (ref <8.0)

## 2022-05-21 NOTE — Progress Notes (Signed)
Inflammatory markers are very high there could be some effect from her recent surgery but might also indicate PMR could be causing the increase in joint symptoms. I still think it would be good to avoid taking a lot of steroids at this time if possible. She can follow up with Dr. Lorin Mercy next month as planned.  If symptoms are not improving within a few weeks or change in plans we could see her back and would try local steroid injections first before going back on daily oral prednisone.

## 2022-05-22 ENCOUNTER — Other Ambulatory Visit (HOSPITAL_COMMUNITY): Payer: Self-pay

## 2022-05-23 ENCOUNTER — Other Ambulatory Visit (HOSPITAL_COMMUNITY): Payer: Self-pay

## 2022-05-28 ENCOUNTER — Other Ambulatory Visit (HOSPITAL_COMMUNITY): Payer: Self-pay

## 2022-05-30 ENCOUNTER — Telehealth: Payer: Self-pay | Admitting: Radiology

## 2022-05-30 NOTE — Telephone Encounter (Signed)
Received fax from [facility], patient requests refill of Gabapentin '100mg'$  capsule, 1 po q HS, may increase to 1 po TID if needed.  #90 with 3 refills.  Last prescribed by Dr. Junius Roads, however, patient has had surgery with Dr. Lorin Mercy since then.   Please advise. OK to refill?

## 2022-05-30 NOTE — Telephone Encounter (Signed)
Please advise. OK to refill?

## 2022-06-02 ENCOUNTER — Other Ambulatory Visit (HOSPITAL_COMMUNITY): Payer: Self-pay

## 2022-06-03 ENCOUNTER — Other Ambulatory Visit (HOSPITAL_COMMUNITY): Payer: Self-pay

## 2022-06-03 MED ORDER — GABAPENTIN 100 MG PO CAPS
ORAL_CAPSULE | ORAL | 0 refills | Status: DC
  Filled 2022-06-03: qty 90, 30d supply, fill #0

## 2022-06-03 NOTE — Addendum Note (Signed)
Addended by: Meyer Cory on: 06/03/2022 08:21 AM   Modules accepted: Orders

## 2022-06-03 NOTE — Telephone Encounter (Signed)
Sent to pharmacy 

## 2022-06-06 ENCOUNTER — Ambulatory Visit: Payer: 59 | Admitting: Orthopaedic Surgery

## 2022-06-08 DIAGNOSIS — G4733 Obstructive sleep apnea (adult) (pediatric): Secondary | ICD-10-CM | POA: Diagnosis not present

## 2022-06-09 ENCOUNTER — Other Ambulatory Visit (HOSPITAL_COMMUNITY): Payer: Self-pay

## 2022-06-12 ENCOUNTER — Encounter: Payer: Self-pay | Admitting: Internal Medicine

## 2022-06-12 DIAGNOSIS — M353 Polymyalgia rheumatica: Secondary | ICD-10-CM

## 2022-06-16 ENCOUNTER — Other Ambulatory Visit (HOSPITAL_COMMUNITY): Payer: Self-pay

## 2022-06-16 MED ORDER — PREDNISONE 10 MG PO TABS
10.0000 mg | ORAL_TABLET | Freq: Every day | ORAL | 1 refills | Status: DC
  Filled 2022-06-16: qty 30, 30d supply, fill #0
  Filled 2022-07-15: qty 30, 30d supply, fill #1

## 2022-06-17 ENCOUNTER — Other Ambulatory Visit (HOSPITAL_COMMUNITY): Payer: Self-pay

## 2022-06-17 ENCOUNTER — Encounter: Payer: Self-pay | Admitting: Orthopaedic Surgery

## 2022-06-17 ENCOUNTER — Ambulatory Visit: Payer: 59 | Admitting: Orthopaedic Surgery

## 2022-06-17 VITALS — BP 132/80 | HR 62 | Ht 65.0 in | Wt 297.0 lb

## 2022-06-17 DIAGNOSIS — M545 Low back pain, unspecified: Secondary | ICD-10-CM | POA: Diagnosis not present

## 2022-06-17 DIAGNOSIS — M5416 Radiculopathy, lumbar region: Secondary | ICD-10-CM | POA: Diagnosis not present

## 2022-06-17 DIAGNOSIS — M79604 Pain in right leg: Secondary | ICD-10-CM

## 2022-06-17 MED ORDER — TRAMADOL HCL 50 MG PO TABS
50.0000 mg | ORAL_TABLET | Freq: Two times a day (BID) | ORAL | 0 refills | Status: DC | PRN
  Filled 2022-06-17: qty 30, 15d supply, fill #0

## 2022-06-17 NOTE — Telephone Encounter (Signed)
I spoke with Monique Clark headaches are ongoing for several weeks now this was not a major feature of her previous symptoms with active PMR before.  So I am less concerned of new vasculitis issue but do recommend we can resume the low-dose prednisone and try starting back at 10 mg daily.  I advised if symptoms or not improving within a week of resuming the previous steroid dose she should contact us again and may need to take a closer look.

## 2022-06-17 NOTE — Progress Notes (Unsigned)
Office Visit Note   Patient: Monique Clark           Date of Birth: October 01, 1965           MRN: [id number] Visit Date: 06/17/2022              Requested by: Hoyt Koch, MD [address] [place],  [place] [address] PCP: Hoyt Koch, MD   Assessment & Plan: Visit Diagnoses: No diagnosis found.  Plan: ***  Follow-Up Instructions: No follow-ups on file.   Orders:  No orders of the defined types were placed in this encounter.  No orders of the defined types were placed in this encounter.     Procedures: No procedures performed   Clinical Data: No additional findings.   Subjective: Chief Complaint  Patient presents with   Lower Back - Pain, Follow-up   Left Leg - Pain, Follow-up    HPI  Review of Systems   Objective: Vital Signs: BP 132/80   Pulse 62   Ht '5\' 5"'$  (1.651 m)   Wt 297 lb (134.7 kg)   BMI 49.42 kg/m   Physical Exam  Ortho Exam  Specialty Comments:  No specialty comments available.  Imaging: No results found.   PMFS History: Patient Active Problem List   Diagnosis Date Noted   OSA on CPAP 05/19/2022   Impingement syndrome of left shoulder 05/13/2022   Morbid (severe) obesity due to excess calories ([place]) 04/29/2022   Leukocytosis 01/20/2022   Class 3 obesity with alveolar hypoventilation without serious comorbidity with body mass index (BMI) of 50.0 to 59.9 in adult ([place]) 01/01/2022   Snoring 01/01/2022   Non-restorative sleep 01/01/2022   Chronic fatigue and immune dysfunction syndrome ([place]) 01/01/2022   High risk medication use 12/02/2021   Otitis externa 11/05/2021   PMR (polymyalgia rheumatica) ([place]) 11/05/2021   Insomnia 05/28/2021   Malignant (primary) neoplasm, unspecified ([place]) 03/08/2021   Personal history of urinary (tract) infections 03/08/2021   Personal history of pneumonia (recurrent) 03/08/2021   Elevated serum hCG 07/02/2020   POTS (postural orthostatic tachycardia syndrome) 03/08/2020    Intermittent claudication ([place]) 01/04/2020   Mixed hyperlipidemia 03/21/2019   Palpitations 03/21/2019   Paroxysmal atrial fibrillation (HCC)    Localized swelling of left lower extremity    Lightheadedness    Essential hypertension    Cellulitis 06/22/2017   Mitral valve regurgitation 06/22/2017   Morbid obesity with body mass index (BMI) of 50.0 to 59.9 in adult [facility]) 12/28/2015   CVID (common variable immunodeficiency) ([place]) 05/07/2015   Post-nasal discharge 03/30/2013   Prediabetes 01/20/2013   History of non-Hodgkin's lymphoma 12/21/2012   Low back pain radiating to right lower extremity 12/10/2012   Right lumbar radiculitis 12/10/2012   Mononucleosis 12/08/1983   Past Medical History:  Diagnosis Date   Abscess    Arthritis    Atrial fibrillation ([place])    Cardiac arrhythmia due to congenital heart disease    trace MR 08/15/20 echo ([place])   CVID (common variable immunodeficiency) ([place]) 2015   Diverticulosis    Elevated cholesterol    GERD (gastroesophageal reflux disease)    High blood pressure    Non Hodgkin's lymphoma ([place]) 1998   Obesity    Personal history of gestational diabetes    PMR (polymyalgia rheumatica) (HCC)    Pneumonia    PONV (postoperative nausea and vomiting)    POTS (postural orthostatic tachycardia syndrome)    Pre-diabetes    UTI (urinary tract infection)  Family History  Problem Relation Age of Onset   Atrial fibrillation Mother    Cancer Father        Colon   Colon cancer Sister    Basal cell carcinoma Brother    Atrial fibrillation Maternal Uncle    Atrial fibrillation Maternal Grandmother    Atrial fibrillation Maternal Grandfather    Heart attack Maternal Grandfather    Ovarian cancer Neg Hx     Past Surgical History:  Procedure Laterality Date   ABDOMINAL HYSTERECTOMY     APPENDECTOMY     CESAREAN SECTION     GALLBLADDER SURGERY     JOINT REPLACEMENT Left    hip   JOINT REPLACEMENT Right    Right Hip 2022    LAPAROSCOPIC GASTRIC SLEEVE RESECTION  04/29/2022   left shoulder repair     LUMBAR LAMINECTOMY N/A 09/18/2021   Procedure: RIGHT LUMBAR FOUR-FIVE MICRODISCECTOMY;  Surgeon: Marybelle Killings, MD;  Location: [facility];  Service: Orthopedics;  Laterality: N/A;   LUNG REMOVAL, PARTIAL Right 2009   VATS, wedge resection partial lobectomy, done at [place] Bilateral    SPLENECTOMY, TOTAL  1998   TONSILLECTOMY AND ADENOIDECTOMY     UPPER GI ENDOSCOPY N/A 04/29/2022   Procedure: UPPER GI ENDOSCOPY;  Surgeon: Kinsinger, Arta Bruce, MD;  Location: [facility];  Service: General;  Laterality: N/A;   Social History   Occupational History   Occupation: rn  Tobacco Use   Smoking status: Former    Packs/day: 0.50    Years: 20.00    Total pack years: 10.00    Types: Cigarettes    Quit date: 09/29/2002    Years since quitting: 19.7   Smokeless tobacco: Never  Vaping Use   Vaping Use: Never used  Substance and Sexual Activity   Alcohol use: No   Drug use: No   Sexual activity: Yes    Birth control/protection: Surgical, Post-menopausal    Comment: 1st intercourse 3 yo-5 partners

## 2022-06-18 ENCOUNTER — Other Ambulatory Visit (HOSPITAL_COMMUNITY): Payer: Self-pay

## 2022-06-18 ENCOUNTER — Encounter: Payer: Self-pay | Admitting: Internal Medicine

## 2022-06-19 DIAGNOSIS — D801 Nonfamilial hypogammaglobulinemia: Secondary | ICD-10-CM | POA: Diagnosis not present

## 2022-06-19 DIAGNOSIS — Z79899 Other long term (current) drug therapy: Secondary | ICD-10-CM | POA: Diagnosis not present

## 2022-06-19 DIAGNOSIS — I4891 Unspecified atrial fibrillation: Secondary | ICD-10-CM | POA: Diagnosis not present

## 2022-06-19 DIAGNOSIS — Z888 Allergy status to other drugs, medicaments and biological substances status: Secondary | ICD-10-CM | POA: Diagnosis not present

## 2022-06-19 DIAGNOSIS — Z7982 Long term (current) use of aspirin: Secondary | ICD-10-CM | POA: Diagnosis not present

## 2022-06-19 DIAGNOSIS — Z7901 Long term (current) use of anticoagulants: Secondary | ICD-10-CM | POA: Diagnosis not present

## 2022-06-19 DIAGNOSIS — I10 Essential (primary) hypertension: Secondary | ICD-10-CM | POA: Diagnosis not present

## 2022-06-19 DIAGNOSIS — R7989 Other specified abnormal findings of blood chemistry: Secondary | ICD-10-CM | POA: Diagnosis not present

## 2022-06-20 ENCOUNTER — Other Ambulatory Visit (HOSPITAL_COMMUNITY): Payer: Self-pay

## 2022-06-20 ENCOUNTER — Other Ambulatory Visit: Payer: Self-pay | Admitting: Orthopaedic Surgery

## 2022-06-24 ENCOUNTER — Encounter: Payer: 59 | Attending: Internal Medicine | Admitting: Dietician

## 2022-06-24 ENCOUNTER — Encounter: Payer: Self-pay | Admitting: Dietician

## 2022-06-24 DIAGNOSIS — E669 Obesity, unspecified: Secondary | ICD-10-CM | POA: Diagnosis not present

## 2022-06-24 NOTE — Progress Notes (Signed)
Bariatric Nutrition Follow-Up Visit Medical Nutrition Therapy  Appt Start Time: 10:00   End Time: 10:27  Surgery date: 04/29/2022 Surgery type: Sleeve Gastrectomy  NUTRITION ASSESSMENT  Anthropometrics  Start weight at [facility]: 324.9 lbs (date: 11/25/2021)  Height: 64.5 in Weight today: 296.7 lbs. BMI: 49.42 kg/m2     Clinical  Medical hx: GDM, cancer: non hodgkin lymphoma, arthritis, hyperlipidemia, hypertension, A-fib, diverticulosis, POTS, PMR Medications: Prednisone  Labs: A1C 5.7; RDW 17; albumin 3.2 Notable signs/symptoms: nothing noted Any previous deficiencies? No   Body Composition Scale 05/13/2022 06/24/2022  Current Body Weight 308.0 296.7  Total Body Fat % 50.2 49.4  Visceral Fat 23 22  Fat-Free Mass % 49.7 50.5   Total Body Water % 39.3 39.7  Muscle-Mass lbs 31.8 31.6  BMI 50.9 49.1  Body Fat Displacement           Torso  lbs 96.0 91.1         Left Leg  lbs 19.2 18.2         Right Leg  lbs 19.2 18.2         Left Arm  lbs 9.6 9.1         Right Arm   lbs 9.6 9.1     Lifestyle & Dietary Hx  Pt states she is back on prednisone. Pt states she takes colace everyday and Murelax when she needs it. Pt states she will have an MRI next week for back issue. Pt states she is walking and wants to start getting on the bike. Pt states she was discouraged for a little while, stating she had about a 2 week stall, and then it broke.  Estimated daily fluid intake: 64 oz Estimated daily protein intake: 60 g Supplements: multivitamin and calcium Current average weekly physical activity: walking   24-Hr Dietary Recall First Meal: cheese stick Snack: peanut butter  Second Meal: chicken, baked beans (no sugar) Snack: peanut butter  Third Meal: steak with mashed potatoes Snack: peanut butter Beverages: water, propel, tea  Post-Op Goals/ Signs/ Symptoms Using straws: no Drinking while eating: no Chewing/swallowing difficulties: no Changes in vision: no Changes to  mood/headaches: no Hair loss/changes to skin/nails: no Difficulty focusing/concentrating: no Sweating: no Limb weakness: no Dizziness/lightheadedness: no Palpitations: no  Carbonated/caffeinated beverages: no N/V/D/C/Gas: taking Murelax and colace  Abdominal pain: no Dumping syndrome: no    NUTRITION DIAGNOSIS  Overweight/obesity ([place]-3.3) related to past poor dietary habits and physical inactivity as evidenced by completed bariatric surgery and following dietary guidelines for continued weight loss and healthy nutrition status.     NUTRITION INTERVENTION Nutrition counseling (C-1) and education (E-2) to facilitate bariatric surgery goals, including: Diet advancement to the next phase (phase 4) now including non-starchy vegetables The importance of consuming adequate calories as well as certain nutrients daily due to the body's need for essential vitamins, minerals, and fats The importance of daily physical activity and to reach a goal of at least 150 minutes of moderate to vigorous physical activity weekly (or as directed by their physician) due to benefits such as increased musculature and improved lab values The importance of intuitive eating specifically learning hunger-satiety cues and understanding the importance of learning a new body: The importance of mindful eating to avoid grazing behaviors   Handouts Provided Include  Phase 4 Food Plan  Learning Style & Readiness for Change Teaching method utilized: Visual & Auditory  Demonstrated degree of understanding via: Teach Back  Readiness Level: Preparation Barriers to learning/adherence to lifestyle change: none  identified  RD's Notes for Next Visit Assess adherence to pt chosen goals   MONITORING & EVALUATION Dietary intake, weekly physical activity, body weight.  Next Steps Patient is to follow-up in January for 6 month class, post-op follow-up.

## 2022-06-25 DIAGNOSIS — I480 Paroxysmal atrial fibrillation: Secondary | ICD-10-CM | POA: Diagnosis not present

## 2022-06-25 DIAGNOSIS — Z9884 Bariatric surgery status: Secondary | ICD-10-CM | POA: Diagnosis not present

## 2022-06-25 DIAGNOSIS — R5383 Other fatigue: Secondary | ICD-10-CM | POA: Diagnosis not present

## 2022-06-25 DIAGNOSIS — E569 Vitamin deficiency, unspecified: Secondary | ICD-10-CM | POA: Diagnosis not present

## 2022-06-26 ENCOUNTER — Other Ambulatory Visit (HOSPITAL_COMMUNITY): Payer: Self-pay

## 2022-06-29 DIAGNOSIS — D801 Nonfamilial hypogammaglobulinemia: Secondary | ICD-10-CM | POA: Diagnosis not present

## 2022-07-04 ENCOUNTER — Telehealth: Payer: Self-pay | Admitting: Orthopaedic Surgery

## 2022-07-04 NOTE — Telephone Encounter (Signed)
Called patient left message to return call to schedule an appointment for MRI review with Dr. Lorin Mercy

## 2022-07-07 ENCOUNTER — Other Ambulatory Visit: Payer: 59

## 2022-07-08 DIAGNOSIS — D801 Nonfamilial hypogammaglobulinemia: Secondary | ICD-10-CM | POA: Diagnosis not present

## 2022-07-08 DIAGNOSIS — G4733 Obstructive sleep apnea (adult) (pediatric): Secondary | ICD-10-CM | POA: Diagnosis not present

## 2022-07-12 ENCOUNTER — Ambulatory Visit
Admission: RE | Admit: 2022-07-12 | Discharge: 2022-07-12 | Disposition: A | Discharge status: Home / Self Care | Payer: 59 | Source: Ambulatory Visit | Attending: Orthopaedic Surgery | Admitting: Orthopaedic Surgery

## 2022-07-12 DIAGNOSIS — M545 Low back pain, unspecified: Secondary | ICD-10-CM

## 2022-07-12 DIAGNOSIS — M5127 Other intervertebral disc displacement, lumbosacral region: Secondary | ICD-10-CM | POA: Diagnosis not present

## 2022-07-12 DIAGNOSIS — M4807 Spinal stenosis, lumbosacral region: Secondary | ICD-10-CM | POA: Diagnosis not present

## 2022-07-12 MED ORDER — GADOPICLENOL 0.5 MMOL/ML IV SOLN
10.0000 mL | Freq: Once | INTRAVENOUS | Status: AC | PRN
  Administered 2022-07-12: 10 mL via INTRAVENOUS

## 2022-07-15 ENCOUNTER — Other Ambulatory Visit: Payer: Self-pay | Admitting: Orthopaedic Surgery

## 2022-07-15 ENCOUNTER — Other Ambulatory Visit (HOSPITAL_COMMUNITY): Payer: Self-pay

## 2022-07-15 ENCOUNTER — Other Ambulatory Visit: Payer: 59

## 2022-07-21 DIAGNOSIS — I10 Essential (primary) hypertension: Secondary | ICD-10-CM | POA: Diagnosis not present

## 2022-07-21 DIAGNOSIS — Z6841 Body Mass Index (BMI) 40.0 and over, adult: Secondary | ICD-10-CM | POA: Diagnosis not present

## 2022-07-21 DIAGNOSIS — I340 Nonrheumatic mitral (valve) insufficiency: Secondary | ICD-10-CM | POA: Diagnosis not present

## 2022-07-21 DIAGNOSIS — Z01810 Encounter for preprocedural cardiovascular examination: Secondary | ICD-10-CM | POA: Diagnosis not present

## 2022-07-21 DIAGNOSIS — I4891 Unspecified atrial fibrillation: Secondary | ICD-10-CM | POA: Diagnosis not present

## 2022-07-23 ENCOUNTER — Encounter: Payer: Self-pay | Admitting: Orthopaedic Surgery

## 2022-07-23 ENCOUNTER — Ambulatory Visit: Payer: 59 | Admitting: Orthopaedic Surgery

## 2022-07-23 VITALS — BP 122/78 | HR 61 | Ht 64.5 in | Wt 296.0 lb

## 2022-07-23 DIAGNOSIS — M5126 Other intervertebral disc displacement, lumbar region: Secondary | ICD-10-CM

## 2022-07-23 DIAGNOSIS — M545 Low back pain, unspecified: Secondary | ICD-10-CM

## 2022-07-23 NOTE — Progress Notes (Signed)
Office Visit Note   Patient: Monique Clark           Date of Birth: 1966/03/15           MRN: [id number] Visit Date: 07/23/2022              Requested by: Hoyt Koch, MD [address] [place],  [place] [address] PCP: Hoyt Koch, MD   Assessment & Plan: Visit Diagnoses:  1. Acute left-sided low back pain, unspecified whether sciatica present   2. Protrusion of lumbar intervertebral disc            Left L4-5.     Previous surgery right L4-5 2022  Plan: We discussed previous decompression on the right with hemilaminectomy looks good.  Unfortunately she now has a disc protrusion at L4-5 on the left.  It is consistent with a radicular symptoms with disc protrusion.  We will set her up for epidural injection on the left.  She does have some degenerative changes at other levels and would like to avoid fusion with her body habitus size and increased load on her disc.  Hopefully we can settle this down with an injection.  We reviewed her old MRI and previously back in 2022 she did not have any disc protrusion on the left at L4-5 just the right side which was surgically corrected and still looks good.  She can follow-up with me after epidural.  Follow-Up Instructions: No follow-ups on file.   Orders:  Orders Placed This Encounter  Procedures   Ambulatory referral to Physical Medicine Rehab   No orders of the defined types were placed in this encounter.     Procedures: No procedures performed   Clinical Data: No additional findings.   Subjective: Chief Complaint  Patient presents with   Lower Back - Pain, Follow-up    MRI lumbar review    HPI 56 year old female returns post right L4-5 microdiscectomy with now back pain and left leg pain.  She has been limping and feels like his grip is grabbing her left leg.  She has had gastric sleeve resection has been working on weight loss and gastric surgery was 04/29/2022.  Lumbar microdiscectomy was 08/29/2021.  New  MRI scans been obtained.  Review of Systems unchanged other than as listed above.   Objective: Vital Signs: BP 122/78   Pulse 61   Ht 5' 4.5" (1.638 m)   Wt 296 lb (134.3 kg)   BMI 50.02 kg/m   Physical Exam Constitutional:      Appearance: She is well-developed.  HENT:     Head: Normocephalic.     Right Ear: External ear normal.     Left Ear: External ear normal. There is no impacted cerumen.  Eyes:     Pupils: Pupils are equal, round, and reactive to light.  Neck:     Thyroid: No thyromegaly.     Trachea: No tracheal deviation.  Cardiovascular:     Rate and Rhythm: Normal rate.  Pulmonary:     Effort: Pulmonary effort is normal.  Abdominal:     Palpations: Abdomen is soft.  Musculoskeletal:     Cervical back: No rigidity.  Skin:    General: Skin is warm and dry.  Neurological:     Mental Status: She is alert and oriented to person, place, and time.  Psychiatric:        Behavior: Behavior normal.     Ortho Exam patient has some weakness ankle dorsiflexion on the  left decreased sensation dorsum of foot upon straight leg raising on the left.  Negative straight leg raising on the right.  Specialty Comments:  MRI LUMBAR SPINE WITHOUT AND WITH CONTRAST   TECHNIQUE: Multiplanar and multiecho pulse sequences of the lumbar spine were obtained without and with intravenous contrast.   CONTRAST:  10 cc of UA intravenous   COMPARISON:  06/14/2021   FINDINGS: Segmentation:  Standard.   Alignment:  Physiologic.   Vertebrae: No fracture, evidence of discitis, or aggressive bone lesion.   Conus medullaris and cauda equina: Conus extends to the L1-2 level. Conus and cauda equina appear normal.   Paraspinal and other soft tissues: Atrophy of intrinsic back muscles, especially at the thoracolumbar junction and at the level of prior surgery where there is posterior scarring.   Disc levels:   L1-L2: Mild spondylosis   L2-L3: Disc narrowing and bulging with  endplate and facet spurring. Small right foraminal protrusion. No neural impingement   L3-L4: Disc narrowing and bulging with endplate and facet spurring. Mild spinal and bilateral foraminal stenosis. Asymmetric left subarticular recess narrowing that is also mild   L4-L5: Disc narrowing and bulging with endplate ridging. New left paracentral herniation compressing the left L5 nerve root. Improved right-sided canal patency after right laminotomy, although there is isointense material at the right subarticular recess that is not clearly enhancing, some residual disc and associated stenosis. Overall moderate spinal stenosis. Facet spurring and disc bulging causes moderate foraminal narrowing on the right.   L5-S1:Disc narrowing and bulging with degenerative facet spurring. Bilateral subarticular recess stenosis that could affect the S1 nerve roots, stable.   IMPRESSION: 1. L4-5 left paracentral herniation impinging on the left L5 nerve root, progressed from 2022. 2. Interval L4-5 right canal decompression with improved patency, although some residual disc and stenosis at the right subarticular recess. Moderate right foraminal narrowing at this level. 3. Stable noncompressive degeneration at the adjacent levels.     Electronically Signed   By: Jorje Guild M.D.   On: 07/14/2022 19:13  Imaging: Narrative & Impression  CLINICAL DATA:  Low back pain. Prior surgery. Left leg pain in the L5 distribution   EXAM: MRI LUMBAR SPINE WITHOUT AND WITH CONTRAST   TECHNIQUE: Multiplanar and multiecho pulse sequences of the lumbar spine were obtained without and with intravenous contrast.   CONTRAST:  10 cc of UA intravenous   COMPARISON:  06/14/2021   FINDINGS: Segmentation:  Standard.   Alignment:  Physiologic.   Vertebrae: No fracture, evidence of discitis, or aggressive bone lesion.   Conus medullaris and cauda equina: Conus extends to the L1-2 level. Conus and cauda  equina appear normal.   Paraspinal and other soft tissues: Atrophy of intrinsic back muscles, especially at the thoracolumbar junction and at the level of prior surgery where there is posterior scarring.   Disc levels:   L1-L2: Mild spondylosis   L2-L3: Disc narrowing and bulging with endplate and facet spurring. Small right foraminal protrusion. No neural impingement   L3-L4: Disc narrowing and bulging with endplate and facet spurring. Mild spinal and bilateral foraminal stenosis. Asymmetric left subarticular recess narrowing that is also mild   L4-L5: Disc narrowing and bulging with endplate ridging. New left paracentral herniation compressing the left L5 nerve root. Improved right-sided canal patency after right laminotomy, although there is isointense material at the right subarticular recess that is not clearly enhancing, some residual disc and associated stenosis. Overall moderate spinal stenosis. Facet spurring and disc bulging causes moderate foraminal  narrowing on the right.   L5-S1:Disc narrowing and bulging with degenerative facet spurring. Bilateral subarticular recess stenosis that could affect the S1 nerve roots, stable.   IMPRESSION: 1. L4-5 left paracentral herniation impinging on the left L5 nerve root, progressed from 2022. 2. Interval L4-5 right canal decompression with improved patency, although some residual disc and stenosis at the right subarticular recess. Moderate right foraminal narrowing at this level. 3. Stable noncompressive degeneration at the adjacent levels.     Electronically Signed   By: Jorje Guild M.D.   On: 07/14/2022 19:13      PMFS History: Patient Active Problem List   Diagnosis Date Noted   Protrusion of lumbar intervertebral disc 07/25/2022   OSA on CPAP 05/19/2022   Impingement syndrome of left shoulder 05/13/2022   Morbid (severe) obesity due to excess calories ([place]) 04/29/2022   Leukocytosis 01/20/2022   Class 3  obesity with alveolar hypoventilation without serious comorbidity with body mass index (BMI) of 50.0 to 59.9 in adult ([place]) 01/01/2022   Snoring 01/01/2022   Non-restorative sleep 01/01/2022   Chronic fatigue and immune dysfunction syndrome ([place]) 01/01/2022   High risk medication use 12/02/2021   Otitis externa 11/05/2021   PMR (polymyalgia rheumatica) ([place]) 11/05/2021   Insomnia 05/28/2021   Malignant (primary) neoplasm, unspecified ([place]) 03/08/2021   Personal history of urinary (tract) infections 03/08/2021   Personal history of pneumonia (recurrent) 03/08/2021   Elevated serum hCG 07/02/2020   POTS (postural orthostatic tachycardia syndrome) 03/08/2020   Intermittent claudication ([place]) 01/04/2020   Mixed hyperlipidemia 03/21/2019   Palpitations 03/21/2019   Paroxysmal atrial fibrillation (HCC)    Localized swelling of left lower extremity    Lightheadedness    Essential hypertension    Cellulitis 06/22/2017   Mitral valve regurgitation 06/22/2017   Morbid obesity with body mass index (BMI) of 50.0 to 59.9 in adult [facility]) 12/28/2015   CVID (common variable immunodeficiency) ([place]) 05/07/2015   Post-nasal discharge 03/30/2013   Prediabetes 01/20/2013   History of non-Hodgkin's lymphoma 12/21/2012   Low back pain radiating to right lower extremity 12/10/2012   Mononucleosis 12/08/1983   Past Medical History:  Diagnosis Date   Abscess    Arthritis    Atrial fibrillation ([place])    Cardiac arrhythmia due to congenital heart disease    trace MR 08/15/20 echo ([place])   CVID (common variable immunodeficiency) ([place]) 2015   Diverticulosis    Elevated cholesterol    GERD (gastroesophageal reflux disease)    High blood pressure    Non Hodgkin's lymphoma ([place]) 1998   Obesity    Personal history of gestational diabetes    PMR (polymyalgia rheumatica) (H[facility])    Pneumonia    PONV (postoperative nausea and vomiting)    POTS (postural orthostatic tachycardia syndrome)     Pre-diabetes    UTI (urinary tract infection)     Family History  Problem Relation Age of Onset   Atrial fibrillation Mother    Cancer Father        Colon   Colon cancer Sister    Basal cell carcinoma Brother    Atrial fibrillation Maternal Uncle    Atrial fibrillation Maternal Grandmother    Atrial fibrillation Maternal Grandfather    Heart attack Maternal Grandfather    Ovarian cancer Neg Hx     Past Surgical History:  Procedure Laterality Date   ABDOMINAL HYSTERECTOMY     APPENDECTOMY     CESAREAN SECTION     GALLBLADDER SURGERY  JOINT REPLACEMENT Left    hip   JOINT REPLACEMENT Right    Right Hip 2022   LAPAROSCOPIC GASTRIC SLEEVE RESECTION  04/29/2022   left shoulder repair     LUMBAR LAMINECTOMY N/A 09/18/2021   Procedure: RIGHT LUMBAR FOUR-FIVE MICRODISCECTOMY;  Surgeon: Marybelle Killings, MD;  Location: [facility];  Service: Orthopedics;  Laterality: N/A;   LUNG REMOVAL, PARTIAL Right 2009   VATS, wedge resection partial lobectomy, done at [facility] Bilateral    SPLENECTOMY, TOTAL  1998   TONSILLECTOMY AND ADENOIDECTOMY     UPPER GI ENDOSCOPY N/A 04/29/2022   Procedure: UPPER GI ENDOSCOPY;  Surgeon: Kinsinger, Arta Bruce, MD;  Location[facility];  Service: General;  Laterality: N/A;   Social History   Occupational History   Occupation: rn  Tobacco Use   Smoking status: Former    Packs/day: 0.50    Years: 20.00    Total pack years: 10.00    Types: Cigarettes    Quit date: 09/29/2002    Years since quitting: 19.8   Smokeless tobacco: Never  Vaping Use   Vaping Use: Never used  Substance and Sexual Activity   Alcohol use: No   Drug use: No   Sexual activity: Yes    Birth control/protection: Surgical, Post-menopausal    Comment: 1st intercourse 10 yo-5 partners

## 2022-07-25 ENCOUNTER — Other Ambulatory Visit: Payer: Self-pay | Admitting: Orthopaedic Surgery

## 2022-07-25 ENCOUNTER — Other Ambulatory Visit (HOSPITAL_COMMUNITY): Payer: Self-pay

## 2022-07-25 DIAGNOSIS — M5126 Other intervertebral disc displacement, lumbar region: Secondary | ICD-10-CM | POA: Insufficient documentation

## 2022-07-25 MED ORDER — TRAMADOL HCL 50 MG PO TABS
50.0000 mg | ORAL_TABLET | Freq: Two times a day (BID) | ORAL | 0 refills | Status: DC | PRN
  Filled 2022-07-25: qty 30, 15d supply, fill #0

## 2022-07-26 ENCOUNTER — Other Ambulatory Visit (HOSPITAL_COMMUNITY): Payer: Self-pay

## 2022-07-28 ENCOUNTER — Other Ambulatory Visit (HOSPITAL_COMMUNITY): Payer: Self-pay

## 2022-07-28 MED ORDER — GABAPENTIN 100 MG PO CAPS
ORAL_CAPSULE | ORAL | 0 refills | Status: DC
  Filled 2022-07-28: qty 90, 30d supply, fill #0

## 2022-07-29 DIAGNOSIS — D801 Nonfamilial hypogammaglobulinemia: Secondary | ICD-10-CM | POA: Diagnosis not present

## 2022-08-05 ENCOUNTER — Telehealth: Payer: Self-pay | Admitting: Orthopaedic Surgery

## 2022-08-05 NOTE — Telephone Encounter (Signed)
Patient states that Dr Lorin Mercy was suppose to get her scheduled with Dr Ernestina Patches.  Please call patient to advise. [phone]

## 2022-08-06 NOTE — Telephone Encounter (Signed)
Spoke with patient and informed her that we are waiting for Dr. Luiz Ochoa to discontinue Xarelto. Informed her that I called his office and left a voicemail for his nurse Anderson Malta.

## 2022-08-14 ENCOUNTER — Telehealth: Payer: Self-pay | Admitting: Internal Medicine

## 2022-08-14 NOTE — Telephone Encounter (Signed)
Inbound call from patient stating that she had a colonoscopy with Dr. Henrene Pastor in 2018 and that she was not due until 2025. Patient stated that her sister had been diagnosed with colon cancer and is requesting a call back to discuss if she needs to come in have a colonoscopy sooner. Please advise.

## 2022-08-14 NOTE — Telephone Encounter (Signed)
Pt states her dad had duodenal cancer and her sister was diagnosed with colon cancer at the age of 71. She wants to know if that should change her recall date. Please advise.

## 2022-08-15 NOTE — Telephone Encounter (Signed)
She would be due around this time, based on that information. She has multiple significant co-morbidities and needs a routine office appointment prior to any scheduling. Thanks

## 2022-08-16 ENCOUNTER — Inpatient Hospital Stay (HOSPITAL_COMMUNITY)
Admission: EM | Admit: 2022-08-16 | Discharge: 2022-08-20 | DRG: 193 | Disposition: A | Discharge status: Home / Self Care | Payer: 59 | Attending: Obstetrics and Gynecology | Admitting: Obstetrics and Gynecology

## 2022-08-16 ENCOUNTER — Emergency Department (HOSPITAL_COMMUNITY): Payer: 59

## 2022-08-16 ENCOUNTER — Other Ambulatory Visit: Payer: Self-pay

## 2022-08-16 DIAGNOSIS — R7881 Bacteremia: Secondary | ICD-10-CM | POA: Diagnosis present

## 2022-08-16 DIAGNOSIS — Z96641 Presence of right artificial hip joint: Secondary | ICD-10-CM | POA: Diagnosis present

## 2022-08-16 DIAGNOSIS — I480 Paroxysmal atrial fibrillation: Secondary | ICD-10-CM | POA: Diagnosis not present

## 2022-08-16 DIAGNOSIS — Z87891 Personal history of nicotine dependence: Secondary | ICD-10-CM

## 2022-08-16 DIAGNOSIS — D839 Common variable immunodeficiency, unspecified: Secondary | ICD-10-CM | POA: Diagnosis not present

## 2022-08-16 DIAGNOSIS — Z888 Allergy status to other drugs, medicaments and biological substances status: Secondary | ICD-10-CM | POA: Diagnosis not present

## 2022-08-16 DIAGNOSIS — G4733 Obstructive sleep apnea (adult) (pediatric): Secondary | ICD-10-CM | POA: Diagnosis not present

## 2022-08-16 DIAGNOSIS — R911 Solitary pulmonary nodule: Secondary | ICD-10-CM | POA: Insufficient documentation

## 2022-08-16 DIAGNOSIS — Z8572 Personal history of non-Hodgkin lymphomas: Secondary | ICD-10-CM | POA: Diagnosis not present

## 2022-08-16 DIAGNOSIS — R062 Wheezing: Secondary | ICD-10-CM

## 2022-08-16 DIAGNOSIS — Z79631 Long term (current) use of antimetabolite agent: Secondary | ICD-10-CM

## 2022-08-16 DIAGNOSIS — J069 Acute upper respiratory infection, unspecified: Secondary | ICD-10-CM | POA: Diagnosis present

## 2022-08-16 DIAGNOSIS — J45901 Unspecified asthma with (acute) exacerbation: Secondary | ICD-10-CM | POA: Diagnosis present

## 2022-08-16 DIAGNOSIS — M353 Polymyalgia rheumatica: Secondary | ICD-10-CM | POA: Diagnosis present

## 2022-08-16 DIAGNOSIS — Z1152 Encounter for screening for COVID-19: Secondary | ICD-10-CM

## 2022-08-16 DIAGNOSIS — I10 Essential (primary) hypertension: Secondary | ICD-10-CM | POA: Diagnosis present

## 2022-08-16 DIAGNOSIS — Z7901 Long term (current) use of anticoagulants: Secondary | ICD-10-CM

## 2022-08-16 DIAGNOSIS — Z9081 Acquired absence of spleen: Secondary | ICD-10-CM

## 2022-08-16 DIAGNOSIS — K5730 Diverticulosis of large intestine without perforation or abscess without bleeding: Secondary | ICD-10-CM | POA: Diagnosis not present

## 2022-08-16 DIAGNOSIS — E7800 Pure hypercholesterolemia, unspecified: Secondary | ICD-10-CM | POA: Diagnosis present

## 2022-08-16 DIAGNOSIS — K219 Gastro-esophageal reflux disease without esophagitis: Secondary | ICD-10-CM | POA: Diagnosis present

## 2022-08-16 DIAGNOSIS — Z9884 Bariatric surgery status: Secondary | ICD-10-CM | POA: Diagnosis not present

## 2022-08-16 DIAGNOSIS — E872 Acidosis, unspecified: Secondary | ICD-10-CM | POA: Diagnosis not present

## 2022-08-16 DIAGNOSIS — Z79899 Other long term (current) drug therapy: Secondary | ICD-10-CM

## 2022-08-16 DIAGNOSIS — Z8249 Family history of ischemic heart disease and other diseases of the circulatory system: Secondary | ICD-10-CM

## 2022-08-16 DIAGNOSIS — R0602 Shortness of breath: Secondary | ICD-10-CM

## 2022-08-16 DIAGNOSIS — Z6841 Body Mass Index (BMI) 40.0 and over, adult: Secondary | ICD-10-CM | POA: Diagnosis not present

## 2022-08-16 DIAGNOSIS — Z9049 Acquired absence of other specified parts of digestive tract: Secondary | ICD-10-CM | POA: Diagnosis not present

## 2022-08-16 DIAGNOSIS — R059 Cough, unspecified: Secondary | ICD-10-CM | POA: Diagnosis not present

## 2022-08-16 DIAGNOSIS — B9789 Other viral agents as the cause of diseases classified elsewhere: Secondary | ICD-10-CM | POA: Diagnosis present

## 2022-08-16 DIAGNOSIS — J129 Viral pneumonia, unspecified: Secondary | ICD-10-CM | POA: Diagnosis not present

## 2022-08-16 DIAGNOSIS — R7303 Prediabetes: Secondary | ICD-10-CM | POA: Diagnosis present

## 2022-08-16 DIAGNOSIS — J9601 Acute respiratory failure with hypoxia: Secondary | ICD-10-CM | POA: Diagnosis present

## 2022-08-16 DIAGNOSIS — R0902 Hypoxemia: Secondary | ICD-10-CM | POA: Diagnosis not present

## 2022-08-16 DIAGNOSIS — A419 Sepsis, unspecified organism: Secondary | ICD-10-CM | POA: Diagnosis not present

## 2022-08-16 DIAGNOSIS — G90A Postural orthostatic tachycardia syndrome (POTS): Secondary | ICD-10-CM | POA: Diagnosis present

## 2022-08-16 LAB — CBC WITH DIFFERENTIAL/PLATELET
Abs Immature Granulocytes: 0.08 10*3/uL — ABNORMAL HIGH (ref 0.00–0.07)
Basophils Absolute: 0 10*3/uL (ref 0.0–0.1)
Basophils Relative: 0 %
Eosinophils Absolute: 0 10*3/uL (ref 0.0–0.5)
Eosinophils Relative: 0 %
HCT: 40.4 % (ref 36.0–46.0)
Hemoglobin: 13.1 g/dL (ref 12.0–15.0)
Immature Granulocytes: 1 %
Lymphocytes Relative: 18 %
Lymphs Abs: 2.8 10*3/uL (ref 0.7–4.0)
MCH: 28.9 pg (ref 26.0–34.0)
MCHC: 32.4 g/dL (ref 30.0–36.0)
MCV: 89 fL (ref 80.0–100.0)
Monocytes Absolute: 0.8 10*3/uL (ref 0.1–1.0)
Monocytes Relative: 5 %
Neutro Abs: 11.8 10*3/uL — ABNORMAL HIGH (ref 1.7–7.7)
Neutrophils Relative %: 76 %
Platelets: 445 10*3/uL — ABNORMAL HIGH (ref 150–400)
RBC: 4.54 MIL/uL (ref 3.87–5.11)
RDW: 17.9 % — ABNORMAL HIGH (ref 11.5–15.5)
WBC: 15.5 10*3/uL — ABNORMAL HIGH (ref 4.0–10.5)
nRBC: 0 % (ref 0.0–0.2)

## 2022-08-16 LAB — TROPONIN I (HIGH SENSITIVITY)
Troponin I (High Sensitivity): 7 ng/L (ref <18)
Troponin I (High Sensitivity): 8 ng/L (ref <18)

## 2022-08-16 LAB — COMPREHENSIVE METABOLIC PANEL
ALT: 22 U/L (ref 0–44)
AST: 31 U/L (ref 15–41)
Albumin: 3.6 g/dL (ref 3.5–5.0)
Alkaline Phosphatase: 70 U/L (ref 38–126)
Anion gap: 13 (ref 5–15)
BUN: 20 mg/dL (ref 6–20)
CO2: 23 mmol/L (ref 22–32)
Calcium: 9.6 mg/dL (ref 8.9–10.3)
Chloride: 101 mmol/L (ref 98–111)
Creatinine, Ser: 0.89 mg/dL (ref 0.44–1.00)
GFR, Estimated: >60 mL/min (ref >60)
Glucose, Bld: 153 mg/dL — ABNORMAL HIGH (ref 70–99)
Potassium: 4.2 mmol/L (ref 3.5–5.1)
Sodium: 137 mmol/L (ref 135–145)
Total Bilirubin: 0.2 mg/dL — ABNORMAL LOW (ref 0.3–1.2)
Total Protein: 6.8 g/dL (ref 6.5–8.1)

## 2022-08-16 LAB — BRAIN NATRIURETIC PEPTIDE: B Natriuretic Peptide: 40.5 pg/mL (ref 0.0–100.0)

## 2022-08-16 LAB — RESP PANEL BY RT-PCR (FLU A&B, COVID) ARPGX2
Influenza A by PCR: NEGATIVE
Influenza B by PCR: NEGATIVE
SARS Coronavirus 2 by RT PCR: NEGATIVE

## 2022-08-16 LAB — LACTIC ACID, PLASMA
Lactic Acid, Venous: 2 mmol/L (ref 0.5–1.9)
Lactic Acid, Venous: 2.3 mmol/L (ref 0.5–1.9)

## 2022-08-16 MED ORDER — CALCIUM CARBONATE ANTACID 500 MG PO CHEW
1.0000 | CHEWABLE_TABLET | Freq: Three times a day (TID) | ORAL | Status: DC
  Administered 2022-08-17 – 2022-08-20 (×10): 200 mg via ORAL
  Filled 2022-08-16 (×10): qty 1

## 2022-08-16 MED ORDER — NEBIVOLOL HCL 5 MG PO TABS
5.0000 mg | ORAL_TABLET | Freq: Every day | ORAL | Status: DC
  Administered 2022-08-17 – 2022-08-20 (×5): 5 mg via ORAL
  Filled 2022-08-16 (×5): qty 1

## 2022-08-16 MED ORDER — FLECAINIDE ACETATE 100 MG PO TABS
100.0000 mg | ORAL_TABLET | Freq: Every evening | ORAL | Status: DC
  Administered 2022-08-17 – 2022-08-19 (×4): 100 mg via ORAL
  Filled 2022-08-16 (×3): qty 1
  Filled 2022-08-16: qty 2
  Filled 2022-08-16: qty 1

## 2022-08-16 MED ORDER — IPRATROPIUM-ALBUTEROL 0.5-2.5 (3) MG/3ML IN SOLN
3.0000 mL | Freq: Once | RESPIRATORY_TRACT | Status: AC
  Administered 2022-08-16: 3 mL via RESPIRATORY_TRACT
  Filled 2022-08-16: qty 3

## 2022-08-16 MED ORDER — METHYLPREDNISOLONE SODIUM SUCC 40 MG IJ SOLR
40.0000 mg | Freq: Every day | INTRAMUSCULAR | Status: DC
  Administered 2022-08-17 – 2022-08-20 (×5): 40 mg via INTRAVENOUS
  Filled 2022-08-16 (×5): qty 1

## 2022-08-16 MED ORDER — IPRATROPIUM-ALBUTEROL 0.5-2.5 (3) MG/3ML IN SOLN: 3.0000 mL | Freq: Four times a day (QID) | RESPIRATORY_TRACT | Status: DC | PRN

## 2022-08-16 MED ORDER — TRAMADOL HCL 50 MG PO TABS
50.0000 mg | ORAL_TABLET | Freq: Two times a day (BID) | ORAL | Status: DC | PRN
  Administered 2022-08-17 – 2022-08-19 (×4): 50 mg via ORAL
  Filled 2022-08-16 (×4): qty 1

## 2022-08-16 MED ORDER — MAGNESIUM SULFATE 2 GM/50ML IV SOLN
2.0000 g | Freq: Once | INTRAVENOUS | Status: AC
  Administered 2022-08-16: 2 g via INTRAVENOUS
  Filled 2022-08-16: qty 50

## 2022-08-16 MED ORDER — ZOLPIDEM TARTRATE 5 MG PO TABS
5.0000 mg | ORAL_TABLET | Freq: Every evening | ORAL | Status: DC | PRN
  Administered 2022-08-17 – 2022-08-19 (×4): 5 mg via ORAL
  Filled 2022-08-16 (×4): qty 1

## 2022-08-16 MED ORDER — DULOXETINE HCL 60 MG PO CPEP
60.0000 mg | ORAL_CAPSULE | Freq: Every day | ORAL | Status: DC
  Administered 2022-08-17 – 2022-08-20 (×5): 60 mg via ORAL
  Filled 2022-08-16 (×3): qty 1
  Filled 2022-08-16: qty 2
  Filled 2022-08-16: qty 1

## 2022-08-16 MED ORDER — FAMOTIDINE 20 MG PO TABS
20.0000 mg | ORAL_TABLET | Freq: Every evening | ORAL | Status: DC
  Administered 2022-08-17 – 2022-08-19 (×4): 20 mg via ORAL
  Filled 2022-08-16 (×5): qty 1

## 2022-08-16 MED ORDER — IOHEXOL 350 MG/ML SOLN
75.0000 mL | Freq: Once | INTRAVENOUS | Status: AC | PRN
  Administered 2022-08-16: 75 mL via INTRAVENOUS

## 2022-08-16 MED ORDER — RIVAROXABAN 20 MG PO TABS
20.0000 mg | ORAL_TABLET | Freq: Every evening | ORAL | Status: DC
  Administered 2022-08-17 – 2022-08-19 (×4): 20 mg via ORAL
  Filled 2022-08-16: qty 1
  Filled 2022-08-16: qty 2
  Filled 2022-08-16 (×2): qty 1

## 2022-08-16 MED ORDER — FOLIC ACID 1 MG PO TABS
1.0000 mg | ORAL_TABLET | Freq: Every day | ORAL | Status: DC
  Administered 2022-08-17 – 2022-08-20 (×5): 1 mg via ORAL
  Filled 2022-08-16 (×5): qty 1

## 2022-08-16 MED ORDER — ACETAMINOPHEN 325 MG PO TABS
650.0000 mg | ORAL_TABLET | Freq: Four times a day (QID) | ORAL | Status: DC | PRN
  Administered 2022-08-17 – 2022-08-18 (×3): 650 mg via ORAL
  Filled 2022-08-16 (×3): qty 2

## 2022-08-16 NOTE — Assessment & Plan Note (Signed)
4 mm right upper lobe pulmonary nodule at that has been unchanged since 2021 likely benign

## 2022-08-16 NOTE — ED Notes (Signed)
Report given to Kuch, RN.  

## 2022-08-16 NOTE — ED Triage Notes (Signed)
Patient here with complaint of progressively worsening shortness of breath that started a few days ago and has gotten worse this afternoon. Patient states she has self-administered six or seven inhalations of albuterol throughout the day with minimal improvement in symptoms. Patient denies chest pain. Patient is alert, oriented, and in no apparent distress at this time.

## 2022-08-16 NOTE — H&P (Signed)
History and Physical    Patient: Monique Clark VHQ:[id number] DOB: 09/10/1966 DOA: 08/16/2022 DOS: the patient was seen and examined on 08/16/2022 PCP: Hoyt Koch, MD  Patient coming from: Home  Chief Complaint:  Chief Complaint  Patient presents with   Shortness of Breath   HPI: Monique Clark is a 56 y.o. female with medical history significant of PMR on methotrexate weekly and daily prednisone, CVID on IVIG q2weeks,  HTN, paroxysmal atrial fibrillation on Xarelto, sleeve gastrectomy, morbid obesity who presents with increasing shortness of breath.   Pt started to have sore throat about a week after daughter was sick with viral URI. Then had 2 days of weakness and fatigued. After that had non-productive cough with increase wheezing.Pt works as Tourist information centre manager here at [employer] and today and increase shortness of breath with ambulation with O2 sats dropping to 86% and decided to be evaluated. She used albuterol inhaler about 6 times today without relieve. Has past tobacco use hx 20 years ago.   In the ED, temperature 98.9, heart rate of 60, BP of 168/83 and had hypoxia down to the 80s requiring 2 L.  WBC of 15.5, hemoglobin of 13, platelet 445.  Lactate of 2.  Sodium of 137, K of 4.2, creatinine of 0.89, CBG of 153  Troponin reassuring at 2. EKG on my review with normal sinus rhythm.  CTA chest was negative for pulmonary embolism, head stable right upper lobe pulmonary nodules that are unchanged from 2021 appears benign.  Patient was given IV magnesium, DuoNeb in the ED.  Flu and COVID PCR still pending.  Hospitalist then consulted for admission.  She feels improved following IV magnesium.  Review of Systems: As mentioned in the history of present illness. All other systems reviewed and are negative. Past Medical History:  Diagnosis Date   Abscess    Arthritis    Atrial fibrillation ([place])    Cardiac arrhythmia due to congenital heart disease    trace MR 08/15/20  echo ([place])   CVID (common variable immunodeficiency) ([place]) 2015   Diverticulosis    Elevated cholesterol    GERD (gastroesophageal reflux disease)    High blood pressure    Non Hodgkin's lymphoma ([place]) 1998   Obesity    Personal history of gestational diabetes    PMR (polymyalgia rheumatica) (HCC)    Pneumonia    PONV (postoperative nausea and vomiting)    POTS (postural orthostatic tachycardia syndrome)    Pre-diabetes    UTI (urinary tract infection)    Past Surgical History:  Procedure Laterality Date   ABDOMINAL HYSTERECTOMY     APPENDECTOMY     CESAREAN SECTION     GALLBLADDER SURGERY     JOINT REPLACEMENT Left    hip   JOINT REPLACEMENT Right    Right Hip 2022   LAPAROSCOPIC GASTRIC SLEEVE RESECTION  04/29/2022   left shoulder repair     LUMBAR LAMINECTOMY N/A 09/18/2021   Procedure: RIGHT LUMBAR FOUR-FIVE MICRODISCECTOMY;  Surgeon: Marybelle Killings, MD;  Location: [facility];  Service: Orthopedics;  Laterality: N/A;   LUNG REMOVAL, PARTIAL Right 2009   VATS, wedge resection partial lobectomy, done at [facility] Bilateral    SPLENECTOMY, TOTAL  1998   TONSILLECTOMY AND ADENOIDECTOMY     UPPER GI ENDOSCOPY N/A 04/29/2022   Procedure: UPPER GI ENDOSCOPY;  Surgeon: Kieth Brightly, Arta Bruce, MD;  Location: [facility];  Service: General;  Laterality: N/A;   Social History:  reports that she  quit smoking about 19 years ago. Her smoking use included cigarettes. She has a 10.00 pack-year smoking history. She has never used smokeless tobacco. She reports that she does not drink alcohol and does not use drugs.  Allergies  Allergen Reactions   Tetanus-Diphtheria Toxoids Td Swelling    angioedema   Nizatidine Hives    Axid   Promethazine Other (See Comments)    High doses causes confusion   Tetanus Toxoids Other (See Comments)    angioedema    Family History  Problem Relation Age of Onset   Atrial fibrillation Mother    Cancer Father        Colon    Colon cancer Sister    Basal cell carcinoma Brother    Atrial fibrillation Maternal Uncle    Atrial fibrillation Maternal Grandmother    Atrial fibrillation Maternal Grandfather    Heart attack Maternal Grandfather    Ovarian cancer Neg Hx     Prior to Admission medications   Medication Sig Start Date End Date Taking? Authorizing Provider  acetaminophen (TYLENOL) 325 MG tablet Take 650 mg by mouth every 6 (six) hours as needed for moderate pain.   Yes [provider]  albuterol (VENTOLIN HFA) 108 (90 Base) MCG/ACT inhaler Inhale 1 puff into the lungs every 6 (six) hours as needed for shortness of breath or wheezing. 08/14/21  Yes [provider]  calcium carbonate (TUMS) 500 MG chewable tablet Chew 1 tablet by mouth 3 (three) times daily.   Yes [provider]  diphenhydrAMINE (BENADRYL) 25 MG tablet Take 25-50 mg by mouth See admin instructions. Take 25 mg by mouth as needed for allergies or itching and take 50 mg by mouth with Hizentra infusion   Yes [provider]  DULoxetine (CYMBALTA) 60 MG capsule Take 1 capsule (60 mg total) by mouth daily. 01/01/22  Yes Hoyt Koch, MD  famotidine (PEPCID) 20 MG tablet Take 20 mg by mouth every evening.   Yes [provider]  flecainide (TAMBOCOR) 100 MG tablet Take 1 tablet by mouth every 12 hours Patient taking differently: Take 100 mg by mouth every evening. 32/20/25  Yes   folic acid (FOLVITE) 1 MG tablet Take 1 tablet (1 mg total) by mouth daily. 05/06/22  Yes Rice, Resa Miner, MD  gabapentin (NEURONTIN) 100 MG capsule Take 1 capsule by mouth every night, may increase to 1 by mouth 3 times a day if needed. Patient taking differently: Take 100-300 mg by mouth every evening. Take 1 capsule by mouth every night, may increase to 1 by mouth 3 times a day if needed. 07/28/22  Yes Marybelle Killings, MD  ibuprofen (ADVIL) 200 MG tablet Take 600 mg by mouth every 6 (six) hours as needed for mild pain or  moderate pain.   Yes [provider]  Immune Globulin, Human, 4 GM/20ML SOLN Inject 36 g into the skin every 14 (fourteen) days. *Tuesday 05/03/15  Yes [provider]  methocarbamol (ROBAXIN) 500 MG tablet Take 1 tablet (500 mg total) by mouth every 6 (six) hours as needed for muscle spasms. 12/20/21  Yes Crain, Whitney L, PA  methotrexate (RHEUMATREX) 2.5 MG tablet Take 6 tablets (15 mg total) by mouth once a week. Caution:Chemotherapy. Protect from light. Patient taking differently: Take 15 mg by mouth once a week. Caution:Chemotherapy. Protect from light. *Thursday* 05/06/22  Yes Rice, Resa Miner, MD  Multiple Vitamin (MULTIVITAMIN WITH MINERALS) TABS tablet Take 1 tablet by mouth daily.   Yes  [provider]  nebivolol (BYSTOLIC) 5 MG tablet Take 1 tablet (5 mg total) by mouth daily. 02/07/22  Yes   NON FORMULARY Pt uses a cpap nightly   Yes [provider]  nystatin-triamcinolone ointment (MYCOLOG) Apply 1 application topically 2 (two) times daily. Patient taking differently: Apply 1 Application topically 2 (two) times daily as needed. 05/06/22  Yes   predniSONE (DELTASONE) 10 MG tablet Take 1 tablet (10 mg total) by mouth daily with breakfast. 06/16/22  Yes Rice, Resa Miner, MD  rivaroxaban (XARELTO) 20 MG TABS tablet Take 1 tablet by mouth every day Patient taking differently: Take 20 mg by mouth every evening. 04/02/22  Yes   traMADol (ULTRAM) 50 MG tablet Take 1 tablet (50 mg total) by mouth every 12 (twelve) hours as needed. Patient taking differently: Take 50 mg by mouth every 12 (twelve) hours as needed for moderate pain. 07/25/22  Yes Lanae Crumbly, PA-C  zolpidem (AMBIEN) 5 MG tablet Take 1 tablet by mouth at bedtime as needed for sleep. 03/07/22  Yes Hoyt Koch, MD  cetirizine (ZYRTEC) 10 MG tablet Take 1 tablet (10 mg total) by mouth daily. Patient not taking: Reported on 08/16/2022 12/20/21   Geryl Councilman L, PA  Cholecalciferol (VITAMIN  D3) 1.25 MG (50000 UT) CAPS TAKE 1 CAPSULE BY MOUTH TWICE A WEEK AS DIRECTED Patient taking differently: Take 50,000 Units by mouth See admin instructions. *500000 units once every two weeks* *Monday* 12/02/21 12/02/22  Hoyt Koch, MD  fluticasone [facility]) 50 MCG/ACT nasal spray Place 2 sprays into both nostrils daily as needed for allergies. Patient not taking: Reported on 05/20/2022    [provider]  montelukast (SINGULAIR) 10 MG tablet Take 1 tablet (10 mg total) by mouth at bedtime. Patient not taking: Reported on 05/20/2022 12/20/21   Geryl Councilman L, PA  ondansetron (ZOFRAN-ODT) 4 MG disintegrating tablet Dissolve 1 tablet (4 mg total) by mouth every 6 (six) hours as needed for nausea or vomiting. Patient not taking: Reported on 08/16/2022 05/01/22   Kinsinger, Arta Bruce, MD    Physical Exam: Vitals:   08/16/22 1628 08/16/22 1845 08/16/22 2113 08/16/22 2115  BP: (!) 168/83 (!) 140/77  134/87  Pulse: 66 60  (!) 53  Resp: 20 18  (!) 21  Temp: 98.8 F (37.1 C)  97.6 F (36.4 C)   TempSrc:   Oral   SpO2: 93% 94%  96%   Constitutional: NAD, calm, comfortable, obese female laying flat in bed Eyes: lids and conjunctivae normal ENMT: Mucous membranes are moist. Neck: normal, supple Respiratory: Faint bibasilar expiratory wheeze.Marland Kitchen Normal respiratory effort. No accessory muscle use.  On 2 L via nasal cannula Cardiovascular: Regular rate and rhythm, no murmurs / rubs / gallops. No extremity edema.  Abdomen: no tenderness, Bowel sounds positive.  Musculoskeletal: no clubbing / cyanosis. No joint deformity upper and lower extremities. Good ROM, no contractures. Normal muscle tone.  Skin: no rashes, lesions, ulcers. No induration Neurologic: CN 2-12 grossly intact.  Strength 5/5 in all 4.  Psychiatric: Normal judgment and insight. Alert and oriented x 3. Normal mood. Data Reviewed:  See HPI  Assessment and Plan: * Acute respiratory failure with hypoxia ([place]) -Had  hypoxia down to 86% with ambulation requiring 2L. No pneumonia or PE seen on CTA chest.  -likely reactive airway from viral URI. COVID/Flu PCR pending.  -received IV magnesium in ED with improvement -continue PRN duoneb q6hr -will increase home daily prednisone to IV '40mg'$  daily  Pulmonary nodule 4 mm right upper lobe pulmonary nodule at that has been unchanged since 2021 likely benign  OSA on CPAP Continue nightly CPAP  PMR (polymyalgia rheumatica) (HCC) -on Methotrexate weekly. Has ran out of meds this week but will just hold for now with likely viral infection -on '10mg'$  prednisone. Will increase dose for possible reactive airway.   Paroxysmal atrial fibrillation (HCC) Continue Xarelto, flecainide and nebivolol  History of non-Hodgkin's lymphoma In remission. Pt follows with oncology in [place], [place]  Morbid obesity with body mass index (BMI) of 50.0 to 59.9 in adult [facility]) S/p sleeve gastrectomy for weight management  CVID (common variable immunodeficiency) ([place]) -on biweekly IVIG SQ injections at home      Advance Care Planning:   Code Status: Full Code   Consults: none  Family Communication: none at bedside  Severity of Illness: The appropriate patient status for this patient is OBSERVATION. Observation status is judged to be reasonable and necessary in order to provide the required intensity of service to ensure the patient's safety. The patient's presenting symptoms, physical exam findings, and initial radiographic and laboratory data in the context of their medical condition is felt to place them at decreased risk for further clinical deterioration. Furthermore, it is anticipated that the patient will be medically stable for discharge from the hospital within 2 midnights of admission.   Author: Orene Desanctis, DO 08/16/2022 11:21 PM  For on call review [email or web address].

## 2022-08-16 NOTE — ED Provider Notes (Signed)
[facility] EMERGENCY DEPARTMENT Provider Note   CSN: [id number] Arrival date & time: 08/16/22  1623     History  Chief Complaint  Patient presents with   Shortness of Breath    Monique Clark is a 56 y.o. female.  The history is provided by the patient and medical records. No language interpreter was used.  Shortness of Breath Severity:  Severe Onset quality:  Gradual Duration:  4 days Timing:  Constant Progression:  Waxing and waning Chronicity:  New Context: URI   Relieved by:  Nothing Worsened by:  Deep breathing and coughing Ineffective treatments:  None tried Associated symptoms: cough, fever (subjective), sputum production and wheezing   Associated symptoms: no abdominal pain, no chest pain (tightness descrinbed), no diaphoresis, no headaches, no neck pain, no rash and no vomiting   Risk factors: hx of cancer   Risk factors: no hx of PE/DVT        Home Medications Prior to Admission medications   Medication Sig Start Date End Date Taking? Authorizing Provider  acetaminophen (TYLENOL) 325 MG tablet Take 650 mg by mouth every 6 (six) hours as needed for moderate pain.    [provider]  albuterol (VENTOLIN HFA) 108 (90 Base) MCG/ACT inhaler Inhale 2 puffs into the lungs every 6 (six) hours as needed for shortness of breath or wheezing. Patient not taking: Reported on 05/20/2022 08/14/21   [provider]  calcium carbonate (TUMS) 500 MG chewable tablet Chew 1 tablet by mouth 3 (three) times daily.    [provider]  cetirizine (ZYRTEC) 10 MG tablet Take 1 tablet (10 mg total) by mouth daily. Patient taking differently: Take 10 mg by mouth at bedtime. 12/20/21   Geryl Councilman L, PA  Cholecalciferol (VITAMIN D3) 1.25 MG (50000 UT) CAPS TAKE 1 CAPSULE BY MOUTH TWICE A WEEK AS DIRECTED 12/02/21 12/02/22  Hoyt Koch, MD  diphenhydrAMINE (BENADRYL) 25 MG tablet Take 25-50 mg by mouth See admin instructions. Take 25  mg by mouth as needed for allergies or itching and take 50 mg by mouth with Hizentra infusion    [provider]  DULoxetine (CYMBALTA) 60 MG capsule Take 1 capsule (60 mg total) by mouth daily. 01/01/22   Hoyt Koch, MD  famotidine (PEPCID) 20 MG tablet Take 20 mg by mouth every evening.    [provider]  flecainide (TAMBOCOR) 100 MG tablet Take 1 tablet by mouth every 12 hours 08/19/21     fluticasone (FLONASE) 50 MCG/ACT nasal spray Place 2 sprays into both nostrils daily as needed for allergies. Patient not taking: Reported on 05/20/2022    [provider]  folic acid (FOLVITE) 1 MG tablet Take 1 tablet (1 mg total) by mouth daily. 05/06/22   Rice, Resa Miner, MD  gabapentin (NEURONTIN) 100 MG capsule Take 1 capsule by mouth every night, may increase to 1 by mouth 3 times a day if needed. 07/28/22   Marybelle Killings, MD  Immune Globulin, Human, 4 GM/20ML SOLN Inject 36 g into the skin every 14 (fourteen) days. Monday 05/03/15   [provider]  methocarbamol (ROBAXIN) 500 MG tablet Take 1 tablet (500 mg total) by mouth every 6 (six) hours as needed for muscle spasms. 12/20/21   Crain, Whitney L, PA  methotrexate (RHEUMATREX) 2.5 MG tablet Take 6 tablets (15 mg total) by mouth once a week. Caution:Chemotherapy. Protect from light. 05/06/22   Collier Salina, MD  montelukast (SINGULAIR) 10 MG tablet Take  1 tablet (10 mg total) by mouth at bedtime. Patient not taking: Reported on 05/20/2022 12/20/21   Geryl Councilman L, PA  Multiple Vitamin (MULTIVITAMIN WITH MINERALS) TABS tablet Take 1 tablet by mouth daily.    [provider]  Multiple Vitamins-Minerals (CELEBRATE MULTI-COMPLETE 60 PO) Take by mouth daily.    [provider]  nebivolol (BYSTOLIC) 5 MG tablet Take 1 tablet (5 mg total) by mouth daily. 02/07/22     NON FORMULARY Pt uses a cpap nightly    [provider]  nystatin-triamcinolone ointment (MYCOLOG) Apply 1 application  topically 2 (two) times daily. 05/06/22     ondansetron (ZOFRAN-ODT) 4 MG disintegrating tablet Dissolve 1 tablet (4 mg total) by mouth every 6 (six) hours as needed for nausea or vomiting. 05/01/22   Kinsinger, Arta Bruce, MD  predniSONE (DELTASONE) 10 MG tablet Take 1 tablet (10 mg total) by mouth daily with breakfast. 06/16/22   Rice, Resa Miner, MD  rivaroxaban (XARELTO) 20 MG TABS tablet Take 1 tablet by mouth every day 04/02/22     traMADol (ULTRAM) 50 MG tablet Take 1 tablet (50 mg total) by mouth every 12 (twelve) hours as needed. 07/25/22   Lanae Crumbly, PA-C  zolpidem (AMBIEN) 5 MG tablet Take 1 tablet by mouth at bedtime as needed for sleep. 03/07/22   Hoyt Koch, MD      Allergies    Tetanus-diphtheria toxoids td, Nizatidine, Promethazine, and Tetanus toxoids    Review of Systems   Review of Systems  Constitutional:  Positive for chills, fatigue and fever (subjective). Negative for diaphoresis.  HENT:  Positive for congestion.   Respiratory:  Positive for cough, sputum production, chest tightness, shortness of breath and wheezing. Negative for stridor.   Cardiovascular:  Negative for chest pain (tightness descrinbed), palpitations and leg swelling.  Gastrointestinal:  Positive for nausea. Negative for abdominal pain, constipation, diarrhea and vomiting.  Genitourinary:  Negative for flank pain.  Musculoskeletal:  Negative for back pain, neck pain and neck stiffness.  Skin:  Negative for rash and wound.  Neurological:  Negative for weakness, light-headedness, numbness and headaches.  Psychiatric/Behavioral:  Negative for agitation and confusion.   All other systems reviewed and are negative.   Physical Exam Updated Vital Signs BP (!) 168/83 (BP Location: Right Arm)   Pulse 66   Temp 98.8 F (37.1 C)   Resp 20   SpO2 93%  Physical Exam Vitals and nursing note reviewed.  Constitutional:      General: She is not in acute distress.    Appearance: She is  well-developed. She is ill-appearing. She is not toxic-appearing or diaphoretic.  HENT:     Head: Normocephalic and atraumatic.     Mouth/Throat:     Mouth: Mucous membranes are moist.  Eyes:     Conjunctiva/sclera: Conjunctivae normal.     Pupils: Pupils are equal, round, and reactive to light.  Cardiovascular:     Rate and Rhythm: Normal rate and regular rhythm.     Heart sounds: No murmur heard. Pulmonary:     Effort: Pulmonary effort is normal. Tachypnea present. No respiratory distress.     Breath sounds: Wheezing and rhonchi present.  Chest:     Chest wall: Tenderness present.  Abdominal:     Palpations: Abdomen is soft.     Tenderness: There is no abdominal tenderness.  Musculoskeletal:        General: No swelling.     Cervical back: Neck supple.  Right lower leg: No edema.     Left lower leg: No edema.  Skin:    General: Skin is warm and dry.     Capillary Refill: Capillary refill takes less than 2 seconds.     Findings: No erythema.  Neurological:     Mental Status: She is alert.  Psychiatric:        Mood and Affect: Mood normal.     ED Results / Procedures / Treatments   Labs (all labs ordered are listed, but only abnormal results are displayed) Labs Reviewed  COMPREHENSIVE METABOLIC PANEL - Abnormal; Notable for the following components:      Result Value   Glucose, Bld 153 (*)    Total Bilirubin 0.2 (*)    All other components within normal limits  CBC WITH DIFFERENTIAL/PLATELET - Abnormal; Notable for the following components:   WBC 15.5 (*)    RDW 17.9 (*)    Platelets 445 (*)    Neutro Abs 11.8 (*)    Abs Immature Granulocytes 0.08 (*)    All other components within normal limits  LACTIC ACID, PLASMA - Abnormal; Notable for the following components:   Lactic Acid, Venous 2.0 (*)    All other components within normal limits  LACTIC ACID, PLASMA - Abnormal; Notable for the following components:   Lactic Acid, Venous 2.3 (*)    All other  components within normal limits  CULTURE, BLOOD (ROUTINE X 2)  CULTURE, BLOOD (ROUTINE X 2)  RESP PANEL BY RT-PCR (FLU A&B, COVID) ARPGX2  BRAIN NATRIURETIC PEPTIDE  CBC  TROPONIN I (HIGH SENSITIVITY)  TROPONIN I (HIGH SENSITIVITY)    EKG EKG Interpretation  Date/Time:  Saturday August 16 2022 16:30:44 EST Ventricular Rate:  60 PR Interval:  150 QRS Duration: 96 QT Interval:  434 QTC Calculation: 434 R Axis:   3 Text Interpretation: Normal sinus rhythm Possible Anterolateral infarct , age undetermined Abnormal ECG When compared with ECG of 29-Nov-2021 11:47, PREVIOUS ECG IS PRESENT when compared to prior, similar appearance. No STEMI Confirmed by Antony Blackbird [phone]) on 08/16/2022 5:02:10 PM  Radiology CT Angio Chest PE W and/or Wo Contrast  Result Date: 08/16/2022 CLINICAL DATA:  High probability for PE. History of pneumonitis, previous wedge resection, cancer. Cough and wheezing. EXAM: CT ANGIOGRAPHY CHEST WITH CONTRAST TECHNIQUE: Multidetector CT imaging of the chest was performed using the standard protocol during bolus administration of intravenous contrast. Multiplanar CT image reconstructions and MIPs were obtained to evaluate the vascular anatomy. RADIATION DOSE REDUCTION: This exam was performed according to the departmental dose-optimization program which includes automated exposure control, adjustment of the mA and/or kV according to patient size and/or use of iterative reconstruction technique. CONTRAST:  70m OMNIPAQUE IOHEXOL 350 MG/ML SOLN COMPARISON:  CT angiogram chest 03/18/2020. FINDINGS: Cardiovascular: Satisfactory opacification of the pulmonary arteries to the segmental level. No evidence of pulmonary embolism. Heart is mildly enlarged, unchanged. No pericardial effusion. Mediastinum/Nodes: No enlarged mediastinal, hilar, or axillary lymph nodes. Thyroid gland, trachea, and esophagus demonstrate no significant findings. Lungs/Pleura: Stable 4 mm right upper lobe  nodule image 8/54 which is unchanged from 2021 compatible with benign etiology. There is minimal lingular atelectasis or scarring, unchanged. Postsurgical changes are again seen in the posterior right lower lobe. The lungs are otherwise clear. No pleural effusion or pneumothorax. Upper Abdomen: Cholecystectomy clips are present. There surgical changes in the stomach. Splenectomy changes again noted. Findings likely related to omental infarct noted in the anterior abdomen, new from prior. Musculoskeletal: No  chest wall abnormality. No acute or significant osseous findings. Review of the MIP images confirms the above findings. IMPRESSION: 1. No evidence for pulmonary embolism. 2. Stable mild cardiomegaly. Electronically Signed   By: Ronney Asters M.D.   On: 08/16/2022 21:11   DG Chest 2 View  Result Date: 08/16/2022 CLINICAL DATA:  Shortness of breath. EXAM: CHEST - 2 VIEW COMPARISON:  11/29/2021 and prior studies FINDINGS: The cardiomediastinal silhouette is unremarkable. Mild peribronchial thickening is unchanged. There is no evidence of focal airspace disease, pulmonary edema, suspicious pulmonary nodule/mass, pleural effusion, or pneumothorax. No acute bony abnormalities are identified. IMPRESSION: No active cardiopulmonary disease. Mild chronic peribronchial thickening. Electronically Signed   By: Margarette Canada M.D.   On: 08/16/2022 16:54    Procedures Procedures    Medications Ordered in ED Medications  magnesium sulfate IVPB 2 g 50 mL (2 g Intravenous New Bag/Given 08/16/22 2228)  ipratropium-albuterol (DUONEB) 0.5-2.5 (3) MG/3ML nebulizer solution 3 mL (has no administration in time range)  methylPREDNISolone sodium succinate (SOLU-MEDROL) 40 mg/mL injection 40 mg (has no administration in time range)  acetaminophen (TYLENOL) tablet 650 mg (has no administration in time range)  traMADol (ULTRAM) tablet 50 mg (has no administration in time range)  flecainide (TAMBOCOR) tablet 100 mg (has no  administration in time range)  nebivolol (BYSTOLIC) tablet 5 mg (has no administration in time range)  DULoxetine (CYMBALTA) DR capsule 60 mg (has no administration in time range)  zolpidem (AMBIEN) tablet 5 mg (has no administration in time range)  calcium carbonate (TUMS - dosed in mg elemental calcium) chewable tablet 200 mg of elemental calcium (has no administration in time range)  famotidine (PEPCID) tablet 20 mg (has no administration in time range)  folic acid (FOLVITE) tablet 1 mg (has no administration in time range)  rivaroxaban (XARELTO) tablet 20 mg (has no administration in time range)  ipratropium-albuterol (DUONEB) 0.5-2.5 (3) MG/3ML nebulizer solution 3 mL (3 mLs Nebulization Given 08/16/22 1823)  iohexol (OMNIPAQUE) 350 MG/ML injection 75 mL (75 mLs Intravenous Contrast Given 08/16/22 2036)    ED Course/ Medical Decision Making/ A&P                           Medical Decision Making Amount and/or Complexity of Data Reviewed Labs: ordered. Radiology: ordered.  Risk Prescription drug management. Decision regarding hospitalization.    Monique Clark is a 56 y.o. female with a complex past medical history including previous non-Hodgkin's lymphoma, paroxysmal atrial fibrillation on Xarelto, hypertension, hyperlipidemia, POTS, obesity status post gastric sleeve resection, GERD, polymyalgia rheumatica, common variable immunodeficiency and gets methotrexate and immunoglobulin injections monthly, previous episodes of pneumonitis, and partial lung removal who presents with worsening wheezing, shortness of breath with intermittent hypoxia, chills, and productive cough.  According to patient, for the last 4 days she is having worsening shortness of breath.  She is using her inhaler frequently and it does not seem to be helping.  She said that when she was checking in her oxygen saturations dipped to 87% in triage intermittently but is not persistent.  She reports she is having chest  tightness and her symptoms are quite exertional and somewhat pleuritic.  She does have history of cancer and although she is on Xarelto she is somewhat concerned about pulm embolism.  She also reports has had history of pneumonitis and she is concerned about that versus pneumonia as well.  She reports chills but no documented fever at home.  She  reports some nausea but no vomiting at this time.  No constipation, diarrhea, or urinary changes.  No leg pain or leg swelling and no new edema.  She denies any trauma and denies other complaints at this time.  On exam, lungs do have some wheezing and rhonchi.  Rales were not appreciated.  Chest was slightly tender to palpation and I do not appreciate a murmur.  Abdomen nontender.  Legs are nontender and not critically edematous.  Good pulses in extremities.  Oxygen saturations was around 93% on room air at rest.  We will check an ambulatory pulse ox.  Patient had some work-up started in triage including some labs and a chest x-ray.  X-ray does not show large pneumonia but does show some peribronchial thickening.  Labs began to return and do show a leukocytosis.  Unclear if this is due to her prednisone use she reports she is taking at this time.  Clinically I do feel we need to get imaging to rule out pneumonitis, occult pneumonia, fluid, or even pulmonary embolism given the exertional and pleuritic symptoms.  She was wheezing significantly so we will give some magnesium and a DuoNeb.  We will ambulate to make sure she is not persistently hypoxic.  We will get other labs as well.  Anticipate shared decision-making conversation to determine disposition.   Oxygen saturations began to drop into the upper 80s while at rest.  Clinically I am concerned about his new oxygen requirement.  CT PE study did not show evidence of acute pulm embolism or large pneumonia however I suspect she has a viral upper respiratory infection causing exacerbation of reactive airway  disease.  Due to the new oxygen requirement, will admit for further management.  Patient admitted for further management.        Final Clinical Impression(s) / ED Diagnoses Final diagnoses:  Hypoxia  SOB (shortness of breath)  Wheezing   Clinical Impression: 1. Hypoxia   2. SOB (shortness of breath)   3. Wheezing     Disposition: Admit  This note was prepared with assistance of Dragon voice recognition software. Occasional wrong-word or sound-a-like substitutions may have occurred due to the inherent limitations of voice recognition software.      Dorothey Oetken, Gwenyth Allegra, MD 08/16/22 2328

## 2022-08-16 NOTE — Assessment & Plan Note (Signed)
-  Continue nightly CPAP °

## 2022-08-16 NOTE — Assessment & Plan Note (Signed)
In remission. Pt follows with oncology in [place], [place]

## 2022-08-16 NOTE — Assessment & Plan Note (Signed)
-  Had hypoxia down to 86% with ambulation requiring 2L. No pneumonia or PE seen on CTA chest.  -likely reactive airway from viral URI. COVID/Flu PCR pending.  -received IV magnesium in ED with improvement -continue PRN duoneb q6hr -will increase home daily prednisone to IV '40mg'$  daily

## 2022-08-16 NOTE — Assessment & Plan Note (Signed)
Continue Xarelto, flecainide and nebivolol

## 2022-08-16 NOTE — Assessment & Plan Note (Signed)
S/p sleeve gastrectomy for weight management

## 2022-08-16 NOTE — Assessment & Plan Note (Signed)
-  on biweekly IVIG SQ injections at home

## 2022-08-16 NOTE — Assessment & Plan Note (Signed)
-  on Methotrexate weekly. Has ran out of meds this week but will just hold for now with likely viral infection -on '10mg'$  prednisone. Will increase dose for possible reactive airway.

## 2022-08-17 ENCOUNTER — Encounter (HOSPITAL_COMMUNITY): Payer: Self-pay | Admitting: Family Medicine

## 2022-08-17 ENCOUNTER — Other Ambulatory Visit: Payer: Self-pay

## 2022-08-17 DIAGNOSIS — J9601 Acute respiratory failure with hypoxia: Secondary | ICD-10-CM | POA: Diagnosis not present

## 2022-08-17 LAB — CBC
HCT: 38.1 % (ref 36.0–46.0)
Hemoglobin: 12.5 g/dL (ref 12.0–15.0)
MCH: 28.9 pg (ref 26.0–34.0)
MCHC: 32.8 g/dL (ref 30.0–36.0)
MCV: 88.2 fL (ref 80.0–100.0)
Platelets: 425 10*3/uL — ABNORMAL HIGH (ref 150–400)
RBC: 4.32 MIL/uL (ref 3.87–5.11)
RDW: 17.6 % — ABNORMAL HIGH (ref 11.5–15.5)
WBC: 15.9 10*3/uL — ABNORMAL HIGH (ref 4.0–10.5)
nRBC: 0 % (ref 0.0–0.2)

## 2022-08-17 LAB — BASIC METABOLIC PANEL
Anion gap: 13 (ref 5–15)
BUN: 17 mg/dL (ref 6–20)
CO2: 21 mmol/L — ABNORMAL LOW (ref 22–32)
Calcium: 9.5 mg/dL (ref 8.9–10.3)
Chloride: 106 mmol/L (ref 98–111)
Creatinine, Ser: 0.86 mg/dL (ref 0.44–1.00)
GFR, Estimated: >60 mL/min (ref >60)
Glucose, Bld: 156 mg/dL — ABNORMAL HIGH (ref 70–99)
Potassium: 3.9 mmol/L (ref 3.5–5.1)
Sodium: 140 mmol/L (ref 135–145)

## 2022-08-17 LAB — LACTIC ACID, PLASMA: Lactic Acid, Venous: 2.8 mmol/L (ref 0.5–1.9)

## 2022-08-17 MED ORDER — IPRATROPIUM-ALBUTEROL 0.5-2.5 (3) MG/3ML IN SOLN
3.0000 mL | Freq: Three times a day (TID) | RESPIRATORY_TRACT | Status: DC
  Administered 2022-08-18 (×3): 3 mL via RESPIRATORY_TRACT
  Filled 2022-08-17 (×3): qty 3

## 2022-08-17 MED ORDER — LACTATED RINGERS IV SOLN: INTRAVENOUS | Status: DC

## 2022-08-17 MED ORDER — ALBUTEROL SULFATE (2.5 MG/3ML) 0.083% IN NEBU
2.5000 mg | INHALATION_SOLUTION | RESPIRATORY_TRACT | Status: DC | PRN
  Administered 2022-08-19: 2.5 mg via RESPIRATORY_TRACT
  Filled 2022-08-17: qty 3

## 2022-08-17 MED ORDER — SODIUM CHLORIDE 0.9 % IV SOLN
500.0000 mg | Freq: Every day | INTRAVENOUS | Status: DC
  Administered 2022-08-17 – 2022-08-19 (×3): 500 mg via INTRAVENOUS
  Filled 2022-08-17 (×4): qty 5

## 2022-08-17 MED ORDER — FLUCONAZOLE 100 MG PO TABS
100.0000 mg | ORAL_TABLET | Freq: Every day | ORAL | Status: DC
  Administered 2022-08-17 – 2022-08-19 (×3): 100 mg via ORAL
  Filled 2022-08-17 (×3): qty 1

## 2022-08-17 MED ORDER — LACTATED RINGERS IV BOLUS
500.0000 mL | Freq: Once | INTRAVENOUS | Status: AC
  Administered 2022-08-17: 500 mL via INTRAVENOUS

## 2022-08-17 MED ORDER — SODIUM CHLORIDE 0.9 % IV SOLN
2.0000 g | INTRAVENOUS | Status: DC
  Administered 2022-08-17 – 2022-08-19 (×3): 2 g via INTRAVENOUS
  Filled 2022-08-17 (×3): qty 20

## 2022-08-17 MED ORDER — GUAIFENESIN ER 600 MG PO TB12
1200.0000 mg | ORAL_TABLET | Freq: Two times a day (BID) | ORAL | Status: DC
  Administered 2022-08-17 – 2022-08-20 (×7): 1200 mg via ORAL
  Filled 2022-08-17 (×7): qty 2

## 2022-08-17 MED ORDER — IPRATROPIUM-ALBUTEROL 0.5-2.5 (3) MG/3ML IN SOLN
3.0000 mL | Freq: Four times a day (QID) | RESPIRATORY_TRACT | Status: DC
  Administered 2022-08-17 (×3): 3 mL via RESPIRATORY_TRACT
  Filled 2022-08-17 (×3): qty 3

## 2022-08-17 NOTE — Progress Notes (Signed)
PROGRESS NOTE    Monique Clark  IRS:[id number] DOB: July 24, 1966 DOA: 08/16/2022 PCP: Hoyt Koch, MD   Brief Narrative: 56 year old with past medical history significant for PMR on methotrexate weekly and daily prednisone, C VID on IVIG every 2 weeks, hypertension, paroxysmal A-fib, on Xarelto, sleeve gastrectomy, morbid obesity presented with increasing shortness of breath.  Symptoms started a week ago with sore throat after her daughter had a viral upper respiratory infection.  She has developed nonproductive cough and wheezing.  Her oxygen saturation was noted to drop to 86% on room air on ambulation.  CT angio was negative for PE.  Patient admitted for acute asthma bronchitis   Assessment & Plan:   Principal Problem:   Acute respiratory failure with hypoxia (HCC) Active Problems:   CVID (common variable immunodeficiency) (HCC)   Morbid obesity with body mass index (BMI) of 50.0 to 59.9 in adult [facility])   History of non-Hodgkin's lymphoma   Paroxysmal atrial fibrillation (HCC)   PMR (polymyalgia rheumatica) (HCC)   OSA on CPAP   Pulmonary nodule   1-Acute Respiratory Failure with Hypoxia: Patient was found to have oxygen saturation 86 on ambulation, required 2 L.  CTA negative for pneumonia. Suspect related to acute bronchitis asthma exacerbation: Continue with scheduled DuoNeb, continue with IVF steroids She probably had a recent upper respiratory infection, due to her compromised immune system I will cover with IV ceftriaxone and azithromycin Check Nocturnal Oxygen sat.   2-Mild elevated lactic acid: We will give IV fluids  3-OSA on CPAP: Continue with nightly CPAP PMR: Methotrexate weekly. She is on 10 mg of prednisone at home  4-Paroxysmal A-fib: Continue with Xarelto flecainide and nebivolol  History of non-Hodgkin lymphoma: In remission  Morbid obesity with body mass index 50 status post lap sleeve gastrectomy for weight management She has lost more  than 45 pounds  Estimated body mass index is 50.02 kg/m as calculated from the following:   Height as of 07/23/22: 5' 4.5" (1.638 m).   Weight as of 07/23/22: 134.3 kg.   DVT prophylaxis: Xarelto Code Status: Full code Family Communication: Care discussed with patient Disposition Plan:  Status is: Observation The patient remains OBS appropriate and will d/c before 2 midnights.    Consultants:    Procedures:    Antimicrobials:    Subjective: She report feeling some what better. Oxygen at times drop. She report sporadic cough. Wheezing improved.   Objective: Vitals:   08/17/22 0315 08/17/22 0330 08/17/22 0345 08/17/22 0721  BP:    112/60  Pulse: (!) 59 (!) 55 (!) 55 60  Resp: (!) '25 18 18 19  '$ Temp:    98.8 F (37.1 C)  TempSrc:      SpO2: 93% 92% 91% 99%    Intake/Output Summary (Last 24 hours) at 08/17/2022 0745 Last data filed at 08/17/2022 0013 Gross per 24 hour  Intake 44.5 ml  Output --  Net 44.5 ml   There were no vitals filed for this visit.  Examination:  General exam: Appears calm and comfortable  Respiratory system: No wheezing Cardiovascular system: S1 & S2 heard, RRR.  Gastrointestinal system: Abdomen is nondistended, soft and nontender. No organomegaly or masses felt. Normal bowel sounds heard. Central nervous system: Alert and oriented.  Extremities: Symmetric 5 x 5 power.  Data Reviewed: I have personally reviewed following labs and imaging studies  CBC: Recent Labs  Lab 08/16/22 1632  WBC 15.5*  NEUTROABS 11.8*  HGB 13.1  HCT 40.4  MCV 89.0  PLT 354*   Basic Metabolic Panel: Recent Labs  Lab 08/16/22 1632  NA 137  K 4.2  CL 101  CO2 23  GLUCOSE 153*  BUN 20  CREATININE 0.89  CALCIUM 9.6   GFR: CrCl cannot be calculated (Unknown ideal weight.). Liver Function Tests: Recent Labs  Lab 08/16/22 1632  AST 31  ALT 22  ALKPHOS 70  BILITOT 0.2*  PROT 6.8  ALBUMIN 3.6   No results for input(s): "LIPASE", "AMYLASE"  in the last 168 hours. No results for input(s): "AMMONIA" in the last 168 hours. Coagulation Profile: No results for input(s): "INR", "PROTIME" in the last 168 hours. Cardiac Enzymes: No results for input(s): "CKTOTAL", "CKMB", "CKMBINDEX", "TROPONINI" in the last 168 hours. BNP (last 3 results) No results for input(s): "PROBNP" in the last 8760 hours. HbA1C: No results for input(s): "HGBA1C" in the last 72 hours. CBG: No results for input(s): "GLUCAP" in the last 168 hours. Lipid Profile: No results for input(s): "CHOL", "HDL", "LDLCALC", "TRIG", "CHOLHDL", "LDLDIRECT" in the last 72 hours. Thyroid Function Tests: No results for input(s): "TSH", "T4TOTAL", "FREET4", "T3FREE", "THYROIDAB" in the last 72 hours. Anemia Panel: No results for input(s): "VITAMINB12", "FOLATE", "FERRITIN", "TIBC", "IRON", "RETICCTPCT" in the last 72 hours. Sepsis Labs: Recent Labs  Lab 08/16/22 2000 08/16/22 2220  LATICACIDVEN 2.0* 2.3*    Recent Results (from the past 240 hour(s))  Blood culture (routine x 2)     Status: None (Preliminary result)   Collection Time: 08/16/22  5:29 PM   Specimen: BLOOD  Result Value Ref Range Status   Specimen Description BLOOD SITE NOT SPECIFIED  Final   Special Requests   Final    BOTTLES DRAWN AEROBIC AND ANAEROBIC Blood Culture results may not be optimal due to an inadequate volume of blood received in culture bottles   Culture   Final    NO GROWTH < 12 HOURS Performed at [facility], [place] [address], [place], [place] [address]    Report Status PENDING  Incomplete  Resp Panel by RT-PCR (Flu A&B, Covid) Anterior Nasal Swab     Status: None   Collection Time: 08/16/22  5:34 PM   Specimen: Anterior Nasal Swab  Result Value Ref Range Status   SARS Coronavirus 2 by RT PCR NEGATIVE NEGATIVE Final    Comment: (NOTE) SARS-CoV-2 target nucleic acids are NOT DETECTED.  The SARS-CoV-2 RNA is generally detectable in upper respiratory specimens during the acute  phase of infection. The lowest concentration of SARS-CoV-2 viral copies this assay can detect is 138 copies/mL. A negative result does not preclude SARS-Cov-2 infection and should not be used as the sole basis for treatment or other patient management decisions. A negative result may occur with  improper specimen collection/handling, submission of specimen other than nasopharyngeal swab, presence of viral mutation(s) within the areas targeted by this assay, and inadequate number of viral copies(<138 copies/mL). A negative result must be combined with clinical observations, patient history, and epidemiological information. The expected result is Negative.  Fact Sheet for Patients:  EntrepreneurPulse.com.au  Fact Sheet for Healthcare Providers:  IncredibleEmployment.be  This test is no t yet approved or cleared by the [place] FDA and  has been authorized for detection and/or diagnosis of SARS-CoV-2 by FDA under an Emergency Use Authorization (EUA). This EUA will remain  in effect (meaning this test can be used) for the duration of the COVID-19 declaration under Section 564(b)(1) of the Act, 21 U.S.C.section 360bbb-3(b)(1), unless the authorization is terminated  or revoked sooner.       Influenza A by PCR NEGATIVE NEGATIVE Final   Influenza B by PCR NEGATIVE NEGATIVE Final    Comment: (NOTE) The Xpert Xpress SARS-CoV-2/FLU/RSV plus assay is intended as an aid in the diagnosis of influenza from Nasopharyngeal swab specimens and should not be used as a sole basis for treatment. Nasal washings and aspirates are unacceptable for Xpert Xpress SARS-CoV-2/FLU/RSV testing.  Fact Sheet for Patients: EntrepreneurPulse.com.au  Fact Sheet for Healthcare Providers: IncredibleEmployment.be  This test is not yet approved or cleared by the [place] FDA and has been authorized for detection and/or diagnosis of  SARS-CoV-2 by FDA under an Emergency Use Authorization (EUA). This EUA will remain in effect (meaning this test can be used) for the duration of the COVID-19 declaration under Section 564(b)(1) of the Act, 21 U.S.C. section 360bbb-3(b)(1), unless the authorization is terminated or revoked.  Performed at [facility], [place] [address], [place], [place] [address]   Blood culture (routine x 2)     Status: None (Preliminary result)   Collection Time: 08/16/22 10:20 PM   Specimen: BLOOD RIGHT FOREARM  Result Value Ref Range Status   Specimen Description BLOOD RIGHT FOREARM  Final   Special Requests   Final    BOTTLES DRAWN AEROBIC AND ANAEROBIC Blood Culture results may not be optimal due to an inadequate volume of blood received in culture bottles   Culture   Final    NO GROWTH < 12 HOURS Performed at [facility], [place] [address] [place], [place] [address]    Report Status PENDING  Incomplete         Radiology Studies: CT Angio Chest PE W and/or Wo Contrast  Result Date: 08/16/2022 CLINICAL DATA:  High probability for PE. History of pneumonitis, previous wedge resection, cancer. Cough and wheezing. EXAM: CT ANGIOGRAPHY CHEST WITH CONTRAST TECHNIQUE: Multidetector CT imaging of the chest was performed using the standard protocol during bolus administration of intravenous contrast. Multiplanar CT image reconstructions and MIPs were obtained to evaluate the vascular anatomy. RADIATION DOSE REDUCTION: This exam was performed according to the departmental dose-optimization program which includes automated exposure control, adjustment of the mA and/or kV according to patient size and/or use of iterative reconstruction technique. CONTRAST:  64m OMNIPAQUE IOHEXOL 350 MG/ML SOLN COMPARISON:  CT angiogram chest 03/18/2020. FINDINGS: Cardiovascular: Satisfactory opacification of the pulmonary arteries to the segmental level. No evidence of pulmonary embolism. Heart is mildly enlarged,  unchanged. No pericardial effusion. Mediastinum/Nodes: No enlarged mediastinal, hilar, or axillary lymph nodes. Thyroid gland, trachea, and esophagus demonstrate no significant findings. Lungs/Pleura: Stable 4 mm right upper lobe nodule image 8/54 which is unchanged from 2021 compatible with benign etiology. There is minimal lingular atelectasis or scarring, unchanged. Postsurgical changes are again seen in the posterior right lower lobe. The lungs are otherwise clear. No pleural effusion or pneumothorax. Upper Abdomen: Cholecystectomy clips are present. There surgical changes in the stomach. Splenectomy changes again noted. Findings likely related to omental infarct noted in the anterior abdomen, new from prior. Musculoskeletal: No chest wall abnormality. No acute or significant osseous findings. Review of the MIP images confirms the above findings. IMPRESSION: 1. No evidence for pulmonary embolism. 2. Stable mild cardiomegaly. Electronically Signed   By: ARonney AstersM.D.   On: 08/16/2022 21:11   DG Chest 2 View  Result Date: 08/16/2022 CLINICAL DATA:  Shortness of breath. EXAM: CHEST - 2 VIEW COMPARISON:  11/29/2021 and prior studies FINDINGS: The  cardiomediastinal silhouette is unremarkable. Mild peribronchial thickening is unchanged. There is no evidence of focal airspace disease, pulmonary edema, suspicious pulmonary nodule/mass, pleural effusion, or pneumothorax. No acute bony abnormalities are identified. IMPRESSION: No active cardiopulmonary disease. Mild chronic peribronchial thickening. Electronically Signed   By: Margarette Canada M.D.   On: 08/16/2022 16:54        Scheduled Meds:  calcium carbonate  1 tablet Oral TID WC   DULoxetine  60 mg Oral Daily   famotidine  20 mg Oral QPM   flecainide  100 mg Oral QPM   folic acid  1 mg Oral Daily   methylPREDNISolone (SOLU-MEDROL) injection  40 mg Intravenous Daily   nebivolol  5 mg Oral Daily   rivaroxaban  20 mg Oral QPM   Continuous  Infusions:  azithromycin     cefTRIAXone (ROCEPHIN)  IV     lactated ringers     lactated ringers       LOS: 0 days    Time spent: 35 minutes    Chanae Gemma A Genessa Beman, MD [facility]   If 7PM-7AM, please contact night-coverage [email or web address]  08/17/2022, 7:45 AM

## 2022-08-17 NOTE — ED Notes (Signed)
Spo-98% on room air at rest and 95% on room air while ambulating.

## 2022-08-18 ENCOUNTER — Inpatient Hospital Stay (HOSPITAL_COMMUNITY): Payer: 59

## 2022-08-18 ENCOUNTER — Other Ambulatory Visit (HOSPITAL_COMMUNITY): Payer: Self-pay

## 2022-08-18 ENCOUNTER — Telehealth: Payer: Self-pay | Admitting: Physical Medicine and Rehabilitation

## 2022-08-18 DIAGNOSIS — B9789 Other viral agents as the cause of diseases classified elsewhere: Secondary | ICD-10-CM | POA: Diagnosis present

## 2022-08-18 DIAGNOSIS — Z888 Allergy status to other drugs, medicaments and biological substances status: Secondary | ICD-10-CM | POA: Diagnosis not present

## 2022-08-18 DIAGNOSIS — I480 Paroxysmal atrial fibrillation: Secondary | ICD-10-CM | POA: Diagnosis present

## 2022-08-18 DIAGNOSIS — K5730 Diverticulosis of large intestine without perforation or abscess without bleeding: Secondary | ICD-10-CM | POA: Diagnosis not present

## 2022-08-18 DIAGNOSIS — E872 Acidosis, unspecified: Secondary | ICD-10-CM | POA: Diagnosis present

## 2022-08-18 DIAGNOSIS — Z6841 Body Mass Index (BMI) 40.0 and over, adult: Secondary | ICD-10-CM | POA: Diagnosis not present

## 2022-08-18 DIAGNOSIS — R911 Solitary pulmonary nodule: Secondary | ICD-10-CM | POA: Diagnosis present

## 2022-08-18 DIAGNOSIS — Z87891 Personal history of nicotine dependence: Secondary | ICD-10-CM | POA: Diagnosis not present

## 2022-08-18 DIAGNOSIS — A419 Sepsis, unspecified organism: Secondary | ICD-10-CM | POA: Diagnosis not present

## 2022-08-18 DIAGNOSIS — M353 Polymyalgia rheumatica: Secondary | ICD-10-CM | POA: Diagnosis present

## 2022-08-18 DIAGNOSIS — G4733 Obstructive sleep apnea (adult) (pediatric): Secondary | ICD-10-CM | POA: Diagnosis present

## 2022-08-18 DIAGNOSIS — E7800 Pure hypercholesterolemia, unspecified: Secondary | ICD-10-CM | POA: Diagnosis present

## 2022-08-18 DIAGNOSIS — Z9049 Acquired absence of other specified parts of digestive tract: Secondary | ICD-10-CM | POA: Diagnosis not present

## 2022-08-18 DIAGNOSIS — J129 Viral pneumonia, unspecified: Secondary | ICD-10-CM | POA: Diagnosis present

## 2022-08-18 DIAGNOSIS — R062 Wheezing: Secondary | ICD-10-CM | POA: Diagnosis present

## 2022-08-18 DIAGNOSIS — Z8572 Personal history of non-Hodgkin lymphomas: Secondary | ICD-10-CM | POA: Diagnosis not present

## 2022-08-18 DIAGNOSIS — D839 Common variable immunodeficiency, unspecified: Secondary | ICD-10-CM | POA: Diagnosis present

## 2022-08-18 DIAGNOSIS — G90A Postural orthostatic tachycardia syndrome (POTS): Secondary | ICD-10-CM | POA: Diagnosis present

## 2022-08-18 DIAGNOSIS — J45901 Unspecified asthma with (acute) exacerbation: Secondary | ICD-10-CM | POA: Diagnosis present

## 2022-08-18 DIAGNOSIS — Z7901 Long term (current) use of anticoagulants: Secondary | ICD-10-CM | POA: Diagnosis not present

## 2022-08-18 DIAGNOSIS — J9601 Acute respiratory failure with hypoxia: Secondary | ICD-10-CM | POA: Diagnosis present

## 2022-08-18 DIAGNOSIS — K219 Gastro-esophageal reflux disease without esophagitis: Secondary | ICD-10-CM | POA: Diagnosis present

## 2022-08-18 DIAGNOSIS — Z1152 Encounter for screening for COVID-19: Secondary | ICD-10-CM | POA: Diagnosis not present

## 2022-08-18 DIAGNOSIS — Z9884 Bariatric surgery status: Secondary | ICD-10-CM | POA: Diagnosis not present

## 2022-08-18 DIAGNOSIS — Z79631 Long term (current) use of antimetabolite agent: Secondary | ICD-10-CM | POA: Diagnosis not present

## 2022-08-18 DIAGNOSIS — R7881 Bacteremia: Secondary | ICD-10-CM | POA: Diagnosis present

## 2022-08-18 DIAGNOSIS — I10 Essential (primary) hypertension: Secondary | ICD-10-CM | POA: Diagnosis present

## 2022-08-18 DIAGNOSIS — Z9081 Acquired absence of spleen: Secondary | ICD-10-CM | POA: Diagnosis not present

## 2022-08-18 LAB — LACTIC ACID, PLASMA
Lactic Acid, Venous: 2.5 mmol/L (ref 0.5–1.9)
Lactic Acid, Venous: 3.3 mmol/L (ref 0.5–1.9)
Lactic Acid, Venous: 2.2 mmol/L (ref 0.5–1.9)
Lactic Acid, Venous: 1.9 mmol/L (ref 0.5–1.9)

## 2022-08-18 LAB — BASIC METABOLIC PANEL
Anion gap: 10 (ref 5–15)
BUN: 17 mg/dL (ref 6–20)
CO2: 25 mmol/L (ref 22–32)
Calcium: 9.3 mg/dL (ref 8.9–10.3)
Chloride: 107 mmol/L (ref 98–111)
Creatinine, Ser: 0.86 mg/dL (ref 0.44–1.00)
GFR, Estimated: >60 mL/min (ref >60)
Glucose, Bld: 101 mg/dL — ABNORMAL HIGH (ref 70–99)
Potassium: 4.3 mmol/L (ref 3.5–5.1)
Sodium: 142 mmol/L (ref 135–145)

## 2022-08-18 LAB — RESPIRATORY PANEL BY PCR

## 2022-08-18 MED ORDER — IOHEXOL 350 MG/ML SOLN
100.0000 mL | Freq: Once | INTRAVENOUS | Status: AC | PRN
  Administered 2022-08-18: 100 mL via INTRAVENOUS

## 2022-08-18 MED ORDER — BUDESONIDE 0.25 MG/2ML IN SUSP: 0.2500 mg | Freq: Two times a day (BID) | RESPIRATORY_TRACT | Status: DC

## 2022-08-18 MED ORDER — SODIUM CHLORIDE 0.9 % IV BOLUS
500.0000 mL | Freq: Once | INTRAVENOUS | Status: AC
  Administered 2022-08-18: 500 mL via INTRAVENOUS

## 2022-08-18 MED ORDER — ARFORMOTEROL TARTRATE 15 MCG/2ML IN NEBU: 15.0000 ug | INHALATION_SOLUTION | Freq: Two times a day (BID) | RESPIRATORY_TRACT | Status: DC

## 2022-08-18 MED ORDER — BUDESONIDE 0.25 MG/2ML IN SUSP
0.2500 mg | Freq: Two times a day (BID) | RESPIRATORY_TRACT | Status: DC
  Administered 2022-08-18 – 2022-08-20 (×4): 0.25 mg via RESPIRATORY_TRACT
  Filled 2022-08-18 (×4): qty 2

## 2022-08-18 MED ORDER — ARFORMOTEROL TARTRATE 15 MCG/2ML IN NEBU
15.0000 ug | INHALATION_SOLUTION | Freq: Two times a day (BID) | RESPIRATORY_TRACT | Status: DC
  Administered 2022-08-18 – 2022-08-20 (×4): 15 ug via RESPIRATORY_TRACT
  Filled 2022-08-18 (×4): qty 2

## 2022-08-18 MED ORDER — LACTATED RINGERS IV BOLUS
500.0000 mL | Freq: Once | INTRAVENOUS | Status: AC
  Administered 2022-08-18: 500 mL via INTRAVENOUS

## 2022-08-18 MED ORDER — VANCOMYCIN HCL 2000 MG/400ML IV SOLN
2000.0000 mg | INTRAVENOUS | Status: DC
  Administered 2022-08-18: 2000 mg via INTRAVENOUS
  Filled 2022-08-18 (×2): qty 400

## 2022-08-18 MED ORDER — LACTATED RINGERS IV BOLUS
1000.0000 mL | Freq: Once | INTRAVENOUS | Status: AC
  Administered 2022-08-18: 1000 mL via INTRAVENOUS

## 2022-08-18 MED ORDER — IOHEXOL 9 MG/ML PO SOLN
500.0000 mL | ORAL | Status: AC
  Administered 2022-08-18 (×2): 500 mL via ORAL

## 2022-08-18 NOTE — Progress Notes (Signed)
Pharmacy Antibiotic Note  Monique Clark is a 56 y.o. female admitted on 08/16/2022 with bacteremia.  Pharmacy has been consulted for Vancomycin dosing.  Vancomycin 2000 mg IV Q 24 hrs. Goal AUC 400-550. Expected AUC: 525 SCr used: 0.86   Plan: Start Vancomycin 2000 mg Iv q24hr Monitor renal function, clinical status, C&S, and vanc levels as needed  Height: '5\' 5"'$  (165.1 cm) Weight: 129.2 kg (284 lb 13.4 oz) IBW/kg (Calculated) : 57  Temp (24hrs), Avg:98.3 F (36.8 C), Min:97.9 F (36.6 C), Max:98.5 F (36.9 C)  Recent Labs  Lab 08/16/22 1632 08/16/22 2000 08/16/22 2220 08/17/22 1617 08/18/22 0252 08/18/22 1227 08/18/22 1534  WBC 15.5*  --   --  15.9*  --   --   --   CREATININE 0.89  --   --  0.86 0.86  --   --   LATICACIDVEN  --  2.0* 2.3* 2.8*  --  2.5* 3.3*    Estimated Creatinine Clearance: 99.1 mL/min (by C-G formula based on SCr of 0.86 mg/dL).    Allergies  Allergen Reactions   Tetanus-Diphtheria Toxoids Td Swelling    angioedema   Nizatidine Hives    Axid   Promethazine Other (See Comments)    High doses causes confusion   Tetanus Toxoids Other (See Comments)    angioedema    Thank you for allowing pharmacy to be a part of this patient's care.  Alanda Slim, PharmD, [facility] Clinical Pharmacist Please see AMION for all Pharmacists' Contact Phone Numbers 08/18/2022, 6:07 PM

## 2022-08-18 NOTE — TOC Initial Note (Signed)
Transition of Care [facility]) - Initial/Assessment Note    Patient Details  Name: Monique Clark MRN: [id number] Date of Birth: 06/30/1966  [facility] CM/SW Contact:    Cyndi Bender, RN Phone Number: 08/18/2022, 1:15 PM  Clinical Narrative:                  Spoke to patient at bedside regarding transition needs. Patient states she has CPAP through [employer]. Notified [place] with [employer] of nocturnal 02 needs at discharge. Home 02 will be delivered to the room prior to discharge.  Patient has no other TOC needs at this time.   Expected Discharge Plan: Home/Self Care Barriers to Discharge: Continued Medical Work up   Patient Goals and CMS Choice Patient states their goals for this hospitalization and ongoing recovery are:: return home      Expected Discharge Plan and Services Expected Discharge Plan: Home/Self Care   Discharge Planning Services: CM Consult Post Acute Care Choice: Durable Medical Equipment Living arrangements for the past 2 months: Single Family Home                 DME Arranged: Oxygen DME Agency: [employer] Date DME Agency Contacted: 08/18/22 Time DME Agency Contacted: 36 Representative spoke with at DME Agency: Annie Sable            Prior Living Arrangements/Services Living arrangements for the past 2 months: [place] with:: Spouse, Minor Children Patient language and need for interpreter reviewed:: Yes Do you feel safe going back to the place where you live?: Yes      Need for Family Participation in Patient Care: Yes (Comment) Care giver support system in place?: Yes (comment) Current home services: DME (cpap) Criminal Activity/Legal Involvement Pertinent to Current Situation/Hospitalization: No - Comment as needed  Activities of Daily Living Home Assistive Devices/Equipment: None ADL Screening (condition at time of admission) Patient's cognitive ability adequate to safely complete daily activities?: Yes Is the  patient deaf or have difficulty hearing?: No Does the patient have difficulty seeing, even when wearing glasses/contacts?: Yes Does the patient have difficulty concentrating, remembering, or making decisions?: No Patient able to express need for assistance with ADLs?: No Does the patient have difficulty dressing or bathing?: No Independently performs ADLs?: Yes (appropriate for developmental age) Does the patient have difficulty walking or climbing stairs?: No Weakness of Legs: None Weakness of Arms/Hands: None  Permission Sought/Granted                  Emotional Assessment Appearance:: Appears stated age Attitude/Demeanor/Rapport: Engaged Affect (typically observed): Accepting Orientation: : Oriented to Self, Oriented to Place, Oriented to  Time, Oriented to Situation Alcohol / Substance Use: Not Applicable Psych Involvement: No (comment)  Admission diagnosis:  Wheezing [R06.2] SOB (shortness of breath) [R06.02] Hypoxia [R09.02] Acute respiratory failure with hypoxia (HCC) [J96.01] Patient Active Problem List   Diagnosis Date Noted   Acute respiratory failure with hypoxia (HCC) 08/16/2022   Pulmonary nodule 08/16/2022   Protrusion of lumbar intervertebral disc 07/25/2022   OSA on CPAP 05/19/2022   Impingement syndrome of left shoulder 05/13/2022   Morbid (severe) obesity due to excess calories ([place]) 04/29/2022   Leukocytosis 01/20/2022   Class 3 obesity with alveolar hypoventilation without serious comorbidity with body mass index (BMI) of 50.0 to 59.9 in adult ([place]) 01/01/2022   Snoring 01/01/2022   Non-restorative sleep 01/01/2022   Chronic fatigue and immune dysfunction syndrome ([place]) 01/01/2022   High risk medication use 12/02/2021  Otitis externa 11/05/2021   PMR (polymyalgia rheumatica) ([place]) 11/05/2021   Insomnia 05/28/2021   Malignant (primary) neoplasm, unspecified ([place]) 03/08/2021   Personal history of urinary (tract) infections 03/08/2021   Personal  history of pneumonia (recurrent) 03/08/2021   Elevated serum hCG 07/02/2020   POTS (postural orthostatic tachycardia syndrome) 03/08/2020   Intermittent claudication ([place]) 01/04/2020   Mixed hyperlipidemia 03/21/2019   Palpitations 03/21/2019   Paroxysmal atrial fibrillation (HCC)    Localized swelling of left lower extremity    Lightheadedness    Essential hypertension    Cellulitis 06/22/2017   Mitral valve regurgitation 06/22/2017   Morbid obesity with body mass index (BMI) of 50.0 to 59.9 in adult [facility]) 12/28/2015   CVID (common variable immunodeficiency) ([place]) 05/07/2015   Post-nasal discharge 03/30/2013   Prediabetes 01/20/2013   History of non-Hodgkin's lymphoma 12/21/2012   Low back pain radiating to right lower extremity 12/10/2012   Mononucleosis 12/08/1983   PCP:  Hoyt Koch, MD Pharmacy:   [facility] [place] [address] Phone: [phone] Fax: [phone]  [id number] [place] [address] [place] [address]Phone: [phone]: [phone] [id number] [address] [place]: [phone]: [id number]: [phone]: [phone]  Social Determinants of Health (SDOH) Interventions    Readmission Risk Interventions     No data to display

## 2022-08-18 NOTE — Progress Notes (Signed)
Patient setup up with overnight pulse ox study.

## 2022-08-18 NOTE — Telephone Encounter (Signed)
Tried calling to reschedule, went to VM

## 2022-08-18 NOTE — Consult Note (Signed)
[name]  Total days of antibiotics 2/ceftiraxone, azithro/fluc               Reason for Consult:bacteremia   Referring Physician: regalado  Principal Problem:   Acute respiratory failure with hypoxia ([place]) Active Problems:   CVID (common variable immunodeficiency) ([place])   Morbid obesity with body mass index (BMI) of 50.0 to 59.9 in adult [facility])   History of non-Hodgkin's lymphoma   Paroxysmal atrial fibrillation (HCC)   PMR (polymyalgia rheumatica) (HCC)   OSA on CPAP   Pulmonary nodule    HPI: Monique [place] is a 56 y.o. female with PMR on methotrexate and prednisode, hx of MALT tumor s/p splenectomy , CVID on IVIG Q 2 wk due on 08/16/2022 admitted for fever, wheezing, acute respiratory distress. Household contact + . Felt poorly 7 days ago but then it was short lived (1-2 days) then recurrence 2 days prior to admit with worsening shortness of breath and wheezing, and myalgias  Past Medical History:  Diagnosis Date   Abscess    Arthritis    Atrial fibrillation ([place])    Cardiac arrhythmia due to congenital heart disease    trace MR 08/15/20 echo ([place])   CVID (common variable immunodeficiency) ([place]) 2015   Diverticulosis    Elevated cholesterol    GERD (gastroesophageal reflux disease)    High blood pressure    Non Hodgkin's lymphoma ([place]) 1998   Obesity    Personal history of gestational diabetes    PMR (polymyalgia rheumatica) (HCC)    Pneumonia    PONV (postoperative nausea and vomiting)    POTS (postural orthostatic tachycardia syndrome)    Pre-diabetes    UTI (urinary tract infection)     Allergies:  Allergies  Allergen Reactions   Tetanus-Diphtheria Toxoids Td Swelling    angioedema   Nizatidine Hives    Axid   Promethazine Other (See Comments)    High doses causes confusion   Tetanus Toxoids Other (See Comments)    angioedema    Current antibiotics:   MEDICATIONS:  arformoterol  15 mcg Nebulization  BID   budesonide (PULMICORT) nebulizer solution  0.25 mg Nebulization BID   calcium carbonate  1 tablet Oral TID WC   DULoxetine  60 mg Oral Daily   famotidine  20 mg Oral QPM   flecainide  100 mg Oral QPM   fluconazole  100 mg Oral Daily   folic acid  1 mg Oral Daily   guaiFENesin  1,200 mg Oral BID   ipratropium-albuterol  3 mL Nebulization TID   methylPREDNISolone (SOLU-MEDROL) injection  40 mg Intravenous Daily   nebivolol  5 mg Oral Daily   rivaroxaban  20 mg Oral QPM    Social History   Tobacco Use   Smoking status: Former    Packs/day: 0.50    Years: 20.00    Total pack years: 10.00    Types: Cigarettes    Quit date: 09/29/2002    Years since quitting: 19.8   Smokeless tobacco: Never  Vaping Use   Vaping Use: Never used  Substance Use Topics   Alcohol use: No   Drug use: No    Family History  Problem Relation Age of Onset   Atrial fibrillation Mother    Cancer Father        Colon   Colon cancer Sister    Basal cell carcinoma Brother    Atrial fibrillation Maternal Uncle    Atrial fibrillation Maternal Grandmother  Atrial fibrillation Maternal Grandfather    Heart attack Maternal Grandfather    Ovarian cancer Neg Hx     Review of Systems -   Constitutional: positive for fever, chills, diaphoresis, activity change, appetite change, fatigue and unexpected weight change.  HENT: Negative for congestion, sore throat, rhinorrhea, sneezing, trouble swallowing and sinus pressure.  Eyes: Negative for photophobia and visual disturbance.  Respiratory:positive for cough, and wheezing. negative chest tightness, shortness of breath, and stridor.  Cardiovascular: Negative for chest pain, palpitations and leg swelling.  Gastrointestinal: Negative for nausea, vomiting, abdominal pain, diarrhea, constipation, blood in stool, abdominal distention and anal bleeding.  Genitourinary: Negative for dysuria, hematuria, flank pain and difficulty urinating.  Musculoskeletal:  Negative for myalgias, back pain, joint swelling, arthralgias and gait problem.  Skin: Negative for color change, pallor, rash and wound.  Neurological: Negative for dizziness, tremors, weakness and light-headedness.  Hematological: Negative for adenopathy. Does not bruise/bleed easily.  Psychiatric/Behavioral: Negative for behavioral problems, confusion, sleep disturbance, dysphoric mood, decreased concentration and agitation.     OBJECTIVE: Temp:  [97.9 F (36.6 C)-98.5 F (36.9 C)] 97.9 F (36.6 C) (11/20 0803) Pulse Rate:  [52-68] 58 (11/20 1116) Resp:  [17-29] 18 (11/20 1116) BP: (110-155)/(63-89) 147/89 (11/20 1116) SpO2:  [88 %-99 %] 99 % (11/20 1456) Physical Exam  Constitutional:  oriented to person, place, and time. appears well-developed and well-nourished. No distress.  HENT: [place]/AT, PERRLA, no scleral icterus Mouth/Throat: Oropharynx is clear and moist. No oropharyngeal exudate.  Cardiovascular: Normal rate, regular rhythm and normal heart sounds. Exam reveals no gallop and no friction rub.  No murmur heard.  Pulmonary/Chest: +wheezing on RUL field. Effort normal and breath sounds normal. No respiratory distress.  Neck = supple, no nuchal rigidity Abdominal: Soft. Bowel sounds are normal.  exhibits no distension. There is no tenderness.  Lymphadenopathy: no cervical adenopathy. No axillary adenopathy Neurological: alert and oriented to person, place, and time.  Skin: Skin is warm and dry. No rash noted. No erythema.  Psychiatric: a normal mood and affect.  behavior is normal.    LABS: Results for orders placed or performed during the hospital encounter of 08/16/22 (from the past 48 hour(s))  Comprehensive metabolic panel     Status: Abnormal   Collection Time: 08/16/22  4:32 PM  Result Value Ref Range   Sodium 137 135 - 145 mmol/L   Potassium 4.2 3.5 - 5.1 mmol/L   Chloride 101 98 - 111 mmol/L   CO2 23 22 - 32 mmol/L   Glucose, Bld 153 (H) 70 - 99 mg/dL     Comment: Glucose reference range applies only to samples taken after fasting for at least 8 hours.   BUN 20 6 - 20 mg/dL   Creatinine, Ser 0.89 0.44 - 1.00 mg/dL   Calcium 9.6 8.9 - 10.3 mg/dL   Total Protein 6.8 6.5 - 8.1 g/dL   Albumin 3.6 3.5 - 5.0 g/dL   AST 31 15 - 41 U/L   ALT 22 0 - 44 U/L   Alkaline Phosphatase 70 38 - 126 U/L   Total Bilirubin 0.2 (L) 0.3 - 1.2 mg/dL   GFR, Estimated >60 >60 mL/min    Comment: (NOTE) Calculated using the CKD-EPI Creatinine Equation (2021)    Anion gap 13 5 - 15    Comment: Performed at [facility] [address], [place],  [address]  CBC with Differential     Status: Abnormal   Collection Time: 08/16/22  4:32 PM  Result Value Ref Range   WBC 15.5 (H) 4.0 - 10.5 K/uL   RBC 4.54 3.87 - 5.11 MIL/uL   Hemoglobin 13.1 12.0 - 15.0 g/dL   HCT 40.4 36.0 - 46.0 %   MCV 89.0 80.0 - 100.0 fL   MCH 28.9 26.0 - 34.0 pg   MCHC 32.4 30.0 - 36.0 g/dL   RDW 17.9 (H) 11.5 - 15.5 %   Platelets 445 (H) 150 - 400 K/uL   nRBC 0.0 0.0 - 0.2 %   Neutrophils Relative % 76 %   Neutro Abs 11.8 (H) 1.7 - 7.7 K/uL   Lymphocytes Relative 18 %   Lymphs Abs 2.8 0.7 - 4.0 K/uL   Monocytes Relative 5 %   Monocytes Absolute 0.8 0.1 - 1.0 K/uL   Eosinophils Relative 0 %   Eosinophils Absolute 0.0 0.0 - 0.5 K/uL   Basophils Relative 0 %   Basophils Absolute 0.0 0.0 - 0.1 K/uL   Immature Granulocytes 1 %   Abs Immature Granulocytes 0.08 (H) 0.00 - 0.07 K/uL    Comment: Performed at [facility], [address] [place], [place] [address]  Blood culture (routine x 2)     Status: None (Preliminary result)   Collection Time: 08/16/22  5:29 PM   Specimen: BLOOD  Result Value Ref Range   Specimen Description BLOOD SITE NOT SPECIFIED    Special Requests      BOTTLES DRAWN AEROBIC AND ANAEROBIC Blood Culture results may not be optimal due to an inadequate volume of blood received in culture bottles   Culture      NO GROWTH 2 DAYS Performed at  [facility], [place] [address] [place], [place] [address]    Report Status PENDING   Resp Panel by RT-PCR (Flu A&B, Covid) Anterior Nasal Swab     Status: None   Collection Time: 08/16/22  5:34 PM   Specimen: Anterior Nasal Swab  Result Value Ref Range   SARS Coronavirus 2 by RT PCR NEGATIVE NEGATIVE    Comment: (NOTE) SARS-CoV-2 target nucleic acids are NOT DETECTED.  The SARS-CoV-2 RNA is generally detectable in upper respiratory specimens during the acute phase of infection. The lowest concentration of SARS-CoV-2 viral copies this assay can detect is 138 copies/mL. A negative result does not preclude SARS-Cov-2 infection and should not be used as the sole basis for treatment or other patient management decisions. A negative result may occur with  improper specimen collection/handling, submission of specimen other than nasopharyngeal swab, presence of viral mutation(s) within the areas targeted by this assay, and inadequate number of viral copies(<138 copies/mL). A negative result must be combined with clinical observations, patient history, and epidemiological information. The expected result is Negative.  Fact Sheet for Patients:  EntrepreneurPulse.com.au  Fact Sheet for Healthcare Providers:  IncredibleEmployment.be  This test is no t yet approved or cleared by the [place] FDA and  has been authorized for detection and/or diagnosis of SARS-CoV-2 by FDA under an Emergency Use Authorization (EUA). This EUA will remain  in effect (meaning this test can be used) for the duration of the COVID-19 declaration under Section 564(b)(1) of the Act, 21 U.S.C.section 360bbb-3(b)(1), unless the authorization is terminated  or revoked sooner.       Influenza A by PCR NEGATIVE NEGATIVE   Influenza B by PCR NEGATIVE NEGATIVE    Comment: (NOTE) The Xpert Xpress SARS-CoV-2/FLU/RSV plus assay is intended as an aid in the diagnosis of  influenza from  Nasopharyngeal swab specimens and should not be used as a sole basis for treatment. Nasal washings and aspirates are unacceptable for Xpert Xpress SARS-CoV-2/FLU/RSV testing.  Fact Sheet for Patients: EntrepreneurPulse.com.au  Fact Sheet for Healthcare Providers: IncredibleEmployment.be  This test is not yet approved or cleared by the [place] FDA and has been authorized for detection and/or diagnosis of SARS-CoV-2 by FDA under an Emergency Use Authorization (EUA). This EUA will remain in effect (meaning this test can be used) for the duration of the COVID-19 declaration under Section 564(b)(1) of the Act, 21 U.S.C. section 360bbb-3(b)(1), unless the authorization is terminated or revoked.  Performed at [facility], [place] [address], [place], [place] [address]   Lactic acid, plasma     Status: Abnormal   Collection Time: 08/16/22  8:00 PM  Result Value Ref Range   Lactic Acid, Venous 2.0 (HH) 0.5 - 1.9 mmol/L    Comment: CRITICAL RESULT CALLED TO, READ BACK BY AND VERIFIED WITH Donnelly Stager, RN, 2147 08/16/22, Aleen Campi Performed at [facility], [place] [address], [place], [place] [address]   Troponin I (High Sensitivity)     Status: None   Collection Time: 08/16/22  8:00 PM  Result Value Ref Range   Troponin I (High Sensitivity) 7 <18 ng/L    Comment: (NOTE) Elevated high sensitivity troponin I (hsTnI) values and significant  changes across serial measurements may suggest ACS but many other  chronic and acute conditions are known to elevate hsTnI results.  Refer to the "Links" section for chest pain algorithms and additional  guidance. Performed at [facility], [place] [address], [place], [place] [address]   Brain natriuretic peptide     Status: None   Collection Time: 08/16/22  8:00 PM  Result Value Ref Range   B Natriuretic Peptide 40.5 0.0 - 100.0 pg/mL    Comment: Performed at [facility] [address], [place], [place] [address]  Lactic acid, plasma     Status: Abnormal   Collection Time: 08/16/22 10:20 PM  Result Value Ref Range   Lactic Acid, Venous 2.3 (HH) 0.5 - 1.9 mmol/L    Comment: CRITICAL VALUE NOTED.  VALUE IS CONSISTENT WITH PREVIOUSLY REPORTED AND CALLED VALUE. Performed at [facility], [place] [address] [place], [place] [address]   Blood culture (routine x 2)     Status: None (Preliminary result)   Collection Time: 08/16/22 10:20 PM   Specimen: BLOOD RIGHT FOREARM  Result Value Ref Range   Specimen Description BLOOD RIGHT FOREARM    Special Requests      BOTTLES DRAWN AEROBIC AND ANAEROBIC Blood Culture results may not be optimal due to an inadequate volume of blood received in culture bottles   Culture  Setup Time      GRAM POSITIVE RODS AEROBIC BOTTLE ONLY CRITICAL RESULT CALLED TO, READ BACK BY AND VERIFIED WITHMikel Cella, AT 4920 08/18/22 D. VANHOOK Performed at [facility], [place] [address] [place], [place] [address]    Culture GRAM POSITIVE RODS    Report Status PENDING   Troponin I (High Sensitivity)     Status: None   Collection Time: 08/16/22 10:20 PM  Result Value Ref Range   Troponin I (High Sensitivity) 8 <18 ng/L    Comment: (NOTE) Elevated high sensitivity troponin I (hsTnI) values and significant  changes across serial measurements may suggest ACS but many other  chronic and acute conditions are known  to elevate hsTnI results.  Refer to the "Links" section for chest pain algorithms and additional  guidance. Performed at [facility], [place] [address], [place], [place] [address]   CBC     Status: Abnormal   Collection Time: 08/17/22  4:17 PM  Result Value Ref Range   WBC 15.9 (H) 4.0 - 10.5 K/uL   RBC 4.32 3.87 - 5.11 MIL/uL   Hemoglobin 12.5 12.0 - 15.0 g/dL   HCT 38.1 36.0 - 46.0 %   MCV 88.2 80.0 - 100.0 fL   MCH 28.9 26.0 - 34.0 pg   MCHC 32.8 30.0 - 36.0 g/dL   RDW 17.6 (H) 11.5 - 15.5 %   Platelets  425 (H) 150 - 400 K/uL   nRBC 0.0 0.0 - 0.2 %    Comment: Performed at [facility] [address], [place], [place] [address]  Basic metabolic panel     Status: Abnormal   Collection Time: 08/17/22  4:17 PM  Result Value Ref Range   Sodium 140 135 - 145 mmol/L   Potassium 3.9 3.5 - 5.1 mmol/L   Chloride 106 98 - 111 mmol/L   CO2 21 (L) 22 - 32 mmol/L   Glucose, Bld 156 (H) 70 - 99 mg/dL    Comment: Glucose reference range applies only to samples taken after fasting for at least 8 hours.   BUN 17 6 - 20 mg/dL   Creatinine, Ser 0.86 0.44 - 1.00 mg/dL   Calcium 9.5 8.9 - 10.3 mg/dL   GFR, Estimated >60 >60 mL/min    Comment: (NOTE) Calculated using the CKD-EPI Creatinine Equation (2021)    Anion gap 13 5 - 15    Comment: Performed at [facility] [address], [place], [place] [address]  Lactic acid, plasma     Status: Abnormal   Collection Time: 08/17/22  4:17 PM  Result Value Ref Range   Lactic Acid, Venous 2.8 (HH) 0.5 - 1.9 mmol/L    Comment: CRITICAL VALUE NOTED. VALUE IS CONSISTENT WITH PREVIOUSLY REPORTED/CALLED VALUE Performed at [facility], [place] [address] [place], [place] [address]   Basic metabolic panel     Status: Abnormal   Collection Time: 08/18/22  2:52 AM  Result Value Ref Range   Sodium 142 135 - 145 mmol/L   Potassium 4.3 3.5 - 5.1 mmol/L   Chloride 107 98 - 111 mmol/L   CO2 25 22 - 32 mmol/L   Glucose, Bld 101 (H) 70 - 99 mg/dL    Comment: Glucose reference range applies only to samples taken after fasting for at least 8 hours.   BUN 17 6 - 20 mg/dL   Creatinine, Ser 0.86 0.44 - 1.00 mg/dL   Calcium 9.3 8.9 - 10.3 mg/dL   GFR, Estimated >60 >60 mL/min    Comment: (NOTE) Calculated using the CKD-EPI Creatinine Equation (2021)    Anion gap 10 5 - 15    Comment: Performed at [facility] [address], [place], [place] [address]  Lactic acid, plasma     Status: Abnormal   Collection Time: 08/18/22 12:27 PM  Result Value  Ref Range   Lactic Acid, Venous 2.5 (HH) 0.5 - 1.9 mmol/L    Comment: CRITICAL VALUE NOTED. VALUE IS CONSISTENT WITH PREVIOUSLY REPORTED/CALLED VALUE Performed at [facility], [place] [address] [place],  [address]     MICRO: Blood cx 1 of 4 bottles with GPR IMAGING:  CT Angio Chest PE W and/or Wo Contrast  Result Date: 08/16/2022 CLINICAL DATA:  High probability for PE. History of pneumonitis, previous wedge resection, cancer. Cough and wheezing. EXAM: CT ANGIOGRAPHY CHEST WITH CONTRAST TECHNIQUE: Multidetector CT imaging of the chest was performed using the standard protocol during bolus administration of intravenous contrast. Multiplanar CT image reconstructions and MIPs were obtained to evaluate the vascular anatomy. RADIATION DOSE REDUCTION: This exam was performed according to the departmental dose-optimization program which includes automated exposure control, adjustment of the mA and/or kV according to patient size and/or use of iterative reconstruction technique. CONTRAST:  31m OMNIPAQUE IOHEXOL 350 MG/ML SOLN COMPARISON:  CT angiogram chest 03/18/2020. FINDINGS: Cardiovascular: Satisfactory opacification of the pulmonary arteries to the segmental level. No evidence of pulmonary embolism. Heart is mildly enlarged, unchanged. No pericardial effusion. Mediastinum/Nodes: No enlarged mediastinal, hilar, or axillary lymph nodes. Thyroid gland, trachea, and esophagus demonstrate no significant findings. Lungs/Pleura: Stable 4 mm right upper lobe nodule image 8/54 which is unchanged from 2021 compatible with benign etiology. There is minimal lingular atelectasis or scarring, unchanged. Postsurgical changes are again seen in the posterior right lower lobe. The lungs are otherwise clear. No pleural effusion or pneumothorax. Upper Abdomen: Cholecystectomy clips are present. There surgical changes in the stomach. Splenectomy changes again noted. Findings likely related to omental infarct noted  in the anterior abdomen, new from prior. Musculoskeletal: No chest wall abnormality. No acute or significant osseous findings. Review of the MIP images confirms the above findings. IMPRESSION: 1. No evidence for pulmonary embolism. 2. Stable mild cardiomegaly. Electronically Signed   By: ARonney AstersM.D.   On: 08/16/2022 21:11   DG Chest 2 View  Result Date: 08/16/2022 CLINICAL DATA:  Shortness of breath. EXAM: CHEST - 2 VIEW COMPARISON:  11/29/2021 and prior studies FINDINGS: The cardiomediastinal silhouette is unremarkable. Mild peribronchial thickening is unchanged. There is no evidence of focal airspace disease, pulmonary edema, suspicious pulmonary nodule/mass, pleural effusion, or pneumothorax. No acute bony abnormalities are identified. IMPRESSION: No active cardiopulmonary disease. Mild chronic peribronchial thickening. Electronically Signed   By: JMargarette CanadaM.D.   On: 08/16/2022 16:54    HISTORICAL MICRO/IMAGING  Assessment/Plan:   - respiratory illness in immunocompromised host= will check RVP multiplex PCR panel to see if other non flu non covid viral pathogens are causing her symptoms. Continue with supportive case. Agree with plan to continue with ur legionella and ur strep pneumoniae  -incidental abd CT findings of omental infarct = had recent gastric sleeve surgery that may be related. Will reach out to radiology to review. Does not necessarily have abdominal pain to correlate  - bacteremia = will add vancomycin for the time being for blood culture +- likely contaminant but await further speciation  - PMR= continue on current regimen,   - hx of CVID= may need to give IVIG during this hospitalization to keep on schedule.

## 2022-08-18 NOTE — Progress Notes (Addendum)
  Transition of Care [facility]) Screening Note   Patient Details  Name: Amaliya Whitelaw Date of Birth: June 08, 1966   Transition [facility]) CM/SW Contact:    Cyndi Bender, RN Phone Number: 08/18/2022, 8:39 AM    Transition of Care Department [facility]) has reviewed patient. We will continue to monitor patient advancement through interdisciplinary progression rounds. If new patient transition needs arise, please place a TOC consult. Follow patient for nocturnal home 02.

## 2022-08-18 NOTE — Progress Notes (Addendum)
PROGRESS NOTE    Monique Clark  VFI:[id number] DOB: [birth year] DOA: 08/16/2022 PCP: Monique Koch, MD   Brief Narrative: 56 year old with past medical history significant for PMR on methotrexate weekly and daily prednisone, C VID on IVIG every 2 weeks, hypertension, paroxysmal A-fib, on Xarelto, sleeve gastrectomy, morbid obesity presented with increasing shortness of breath.  Symptoms started a week ago with sore throat after her daughter had a viral upper respiratory infection.  She has developed nonproductive cough and wheezing.  Her oxygen saturation was noted to drop to 86% on room air on ambulation.  CT angio was negative for PE.  Patient admitted for acute asthma bronchitis   Assessment & Plan:   Principal Problem:   Acute respiratory failure with hypoxia (HCC) Active Problems:   CVID (common variable immunodeficiency) (HCC)   Morbid obesity with body mass index (BMI) of 50.0 to 59.9 in adult Monique Clark)   History of non-Hodgkin's lymphoma   Paroxysmal atrial fibrillation (HCC)   PMR (polymyalgia rheumatica) (HCC)   OSA on CPAP   Pulmonary nodule   1-Acute Respiratory Failure with Hypoxia: Patient was found to have oxygen saturation 86 on ambulation, required 2 L.  CTA negative for pneumonia. Suspect related to acute bronchitis, asthma exacerbation: Continue with scheduled DuoNeb, Continue with IVF steroids She probably had a recent upper respiratory infection, due to her compromised immune system I will cover with IV ceftriaxone and azithromycin Nocturnal Oxygen sat. 14 episodes of hypoxia. Nocturnal home oxygen ordered.  -1/4 Blood culture positive for Gram positive rods. ID has been consulted.  -Will add Pulmicort and brovana  2-Elevated lactic acid: Received IV fluids. Repeat labs pending.  Updated:  Lactic acid increasing to 3.3 , will give more fluids. ID has added IV vancomycin.  I will check CT abdomen pelvis, Omentum ischemia on CT chest  CT abdomen pelvis  stat.   3-OSA on CPAP: Continue with nightly CPAP PMR: Methotrexate weekly. She is on 10 mg of prednisone at home  4-Paroxysmal A-fib: Continue with Xarelto flecainide and nebivolol  History of non-Hodgkin lymphoma: In remission  Morbid obesity with body mass index 50 status post lap sleeve gastrectomy for weight management She has lost more than 45 pounds  Estimated body mass index is 47.4 kg/m as calculated from the following:   Height as of this encounter: '5\' 5"'$  (1.651 m).   Weight as of this encounter: 129.2 kg.   DVT prophylaxis: Xarelto Code Status: Full code Family Communication: Care discussed with patient Disposition Plan:  Status is: Observation The patient remains OBS appropriate and will d/c before 2 midnights.    Consultants:    Procedures:    Antimicrobials:    Subjective: She continue to have wheezing and cough.  Feels weak.   Objective: Vitals:   08/18/22 0716 08/18/22 0757 08/18/22 0803 08/18/22 1116  BP:   135/67 (!) 147/89  Pulse: (!) 52  (!) 54 (!) 58  Resp: (!) 22  (!) 23 18  Temp:   97.9 F (36.6 C)   TempSrc:   Oral   SpO2: (!) 88% 95% 94% 94%  Weight:      Height:        Intake/Output Summary (Last 24 hours) at 08/18/2022 1246 Last data filed at 08/18/2022 1100 Gross per 24 hour  Intake 2494.88 ml  Output --  Net 2494.88 ml    Filed Weights   08/17/22 1535  Weight: 129.2 kg    Examination:  General exam: NAD Respiratory system: BL wheezing  Cardiovascular system: S 1, S 2 RRR Gastrointestinal system: BS present, soft, nt Extremities: No edema  Data Reviewed: I have personally reviewed following labs and imaging studies  CBC: Recent Labs  Lab 08/16/22 1632 08/17/22 1617  WBC 15.5* 15.9*  NEUTROABS 11.8*  --   HGB 13.1 12.5  HCT 40.4 38.1  MCV 89.0 88.2  PLT 445* 425*    Basic Metabolic Panel: Recent Labs  Lab 08/16/22 1632 08/17/22 1617 08/18/22 0252  NA 137 140 142  K 4.2 3.9 4.3  CL 101 106 107   CO2 23 21* 25  GLUCOSE 153* 156* 101*  BUN '20 17 17  '$ CREATININE 0.89 0.86 0.86  CALCIUM 9.6 9.5 9.3    GFR: Estimated Creatinine Clearance: 99.1 mL/min (by C-G formula based on SCr of 0.86 mg/dL). Liver Function Tests: Recent Labs  Lab 08/16/22 1632  AST 31  ALT 22  ALKPHOS 70  BILITOT 0.2*  PROT 6.8  ALBUMIN 3.6    No results for input(s): "LIPASE", "AMYLASE" in the last 168 hours. No results for input(s): "AMMONIA" in the last 168 hours. Coagulation Profile: No results for input(s): "INR", "PROTIME" in the last 168 hours. Cardiac Enzymes: No results for input(s): "CKTOTAL", "CKMB", "CKMBINDEX", "TROPONINI" in the last 168 hours. BNP (last 3 results) No results for input(s): "PROBNP" in the last 8760 hours. HbA1C: No results for input(s): "HGBA1C" in the last 72 hours. CBG: No results for input(s): "GLUCAP" in the last 168 hours. Lipid Profile: No results for input(s): "CHOL", "HDL", "LDLCALC", "TRIG", "CHOLHDL", "LDLDIRECT" in the last 72 hours. Thyroid Function Tests: No results for input(s): "TSH", "T4TOTAL", "FREET4", "T3FREE", "THYROIDAB" in the last 72 hours. Anemia Panel: No results for input(s): "VITAMINB12", "FOLATE", "FERRITIN", "TIBC", "IRON", "RETICCTPCT" in the last 72 hours. Sepsis Labs: Recent Labs  Lab 08/16/22 2000 08/16/22 2220 08/17/22 1617  LATICACIDVEN 2.0* 2.3* 2.8*     Recent Results (from the past 240 hour(s))  Blood culture (routine x 2)     Status: None (Preliminary result)   Collection Time: 08/16/22  5:29 PM   Specimen: BLOOD  Result Value Ref Range Status   Specimen Description BLOOD SITE NOT SPECIFIED  Final   Special Requests   Final    BOTTLES DRAWN AEROBIC AND ANAEROBIC Blood Culture results may not be optimal due to an inadequate volume of blood received in culture bottles   Culture   Final    NO GROWTH 2 DAYS Performed at [facility], [place] [address], [place], [place] [address]    Report Status PENDING   Incomplete  Resp Panel by RT-PCR (Flu A&B, Covid) Anterior Nasal Swab     Status: None   Collection Time: 08/16/22  5:34 PM   Specimen: Anterior Nasal Swab  Result Value Ref Range Status   SARS Coronavirus 2 by RT PCR NEGATIVE NEGATIVE Final    Comment: (NOTE) SARS-CoV-2 target nucleic acids are NOT DETECTED.  The SARS-CoV-2 RNA is generally detectable in upper respiratory specimens during the acute phase of infection. The lowest concentration of SARS-CoV-2 viral copies this assay can detect is 138 copies/mL. A negative result does not preclude SARS-Cov-2 infection and should not be used as the sole basis for treatment or other patient management decisions. A negative result may occur with  improper specimen collection/handling, submission of specimen other than nasopharyngeal swab, presence of viral mutation(s) within the areas targeted by this assay, and inadequate number of viral copies(<138 copies/mL). A negative result must be combined with  clinical observations, patient history, and epidemiological information. The expected result is Negative.  Fact Sheet for Patients:  EntrepreneurPulse.com.au  Fact Sheet for Healthcare Providers:  IncredibleEmployment.be  This test is no t yet approved or cleared by the [place] FDA and  has been authorized for detection and/or diagnosis of SARS-CoV-2 by FDA under an Emergency Use Authorization (EUA). This EUA will remain  in effect (meaning this test can be used) for the duration of the COVID-19 declaration under Section 564(b)(1) of the Act, 21 U.S.C.section 360bbb-3(b)(1), unless the authorization is terminated  or revoked sooner.       Influenza A by PCR NEGATIVE NEGATIVE Final   Influenza B by PCR NEGATIVE NEGATIVE Final    Comment: (NOTE) The Xpert Xpress SARS-CoV-2/FLU/RSV plus assay is intended as an aid in the diagnosis of influenza from Nasopharyngeal swab specimens and should not be  used as a sole basis for treatment. Nasal washings and aspirates are unacceptable for Xpert Xpress SARS-CoV-2/FLU/RSV testing.  Fact Sheet for Patients: EntrepreneurPulse.com.au  Fact Sheet for Healthcare Providers: IncredibleEmployment.be  This test is not yet approved or cleared by the [place] FDA and has been authorized for detection and/or diagnosis of SARS-CoV-2 by FDA under an Emergency Use Authorization (EUA). This EUA will remain in effect (meaning this test can be used) for the duration of the COVID-19 declaration under Section 564(b)(1) of the Act, 21 U.S.C. section 360bbb-3(b)(1), unless the authorization is terminated or revoked.  Performed at [facility], [place] [address] [place], [place] [address]   Blood culture (routine x 2)     Status: None (Preliminary result)   Collection Time: 08/16/22 10:20 PM   Specimen: BLOOD RIGHT FOREARM  Result Value Ref Range Status   Specimen Description BLOOD RIGHT FOREARM  Final   Special Requests   Final    BOTTLES DRAWN AEROBIC AND ANAEROBIC Blood Culture results may not be optimal due to an inadequate volume of blood received in culture bottles   Culture  Setup Time   Final    GRAM POSITIVE RODS AEROBIC BOTTLE ONLY CRITICAL RESULT CALLED TO, READ BACK BY AND VERIFIED WITHMikel Cella, AT 1941 08/18/22 D. VANHOOK Performed at [facility], [place] [address] [place], [place] [address]    Culture GRAM POSITIVE RODS  Final   Report Status PENDING  Incomplete         Radiology Studies: CT Angio Chest PE W and/or Wo Contrast  Result Date: 08/16/2022 CLINICAL DATA:  High probability for PE. History of pneumonitis, previous wedge resection, cancer. Cough and wheezing. EXAM: CT ANGIOGRAPHY CHEST WITH CONTRAST TECHNIQUE: Multidetector CT imaging of the chest was performed using the standard protocol during bolus administration of intravenous contrast. Multiplanar CT image  reconstructions and MIPs were obtained to evaluate the vascular anatomy. RADIATION DOSE REDUCTION: This exam was performed according to the departmental dose-optimization program which includes automated exposure control, adjustment of the mA and/or kV according to patient size and/or use of iterative reconstruction technique. CONTRAST:  7m OMNIPAQUE IOHEXOL 350 MG/ML SOLN COMPARISON:  CT angiogram chest 03/18/2020. FINDINGS: Cardiovascular: Satisfactory opacification of the pulmonary arteries to the segmental level. No evidence of pulmonary embolism. Heart is mildly enlarged, unchanged. No pericardial effusion. Mediastinum/Nodes: No enlarged mediastinal, hilar, or axillary lymph nodes. Thyroid gland, trachea, and esophagus demonstrate no significant findings. Lungs/Pleura: Stable 4 mm right upper lobe nodule image 8/54 which is unchanged from 2021 compatible with benign etiology. There is minimal lingular atelectasis or scarring,  unchanged. Postsurgical changes are again seen in the posterior right lower lobe. The lungs are otherwise clear. No pleural effusion or pneumothorax. Upper Abdomen: Cholecystectomy clips are present. There surgical changes in the stomach. Splenectomy changes again noted. Findings likely related to omental infarct noted in the anterior abdomen, new from prior. Musculoskeletal: No chest wall abnormality. No acute or significant osseous findings. Review of the MIP images confirms the above findings. IMPRESSION: 1. No evidence for pulmonary embolism. 2. Stable mild cardiomegaly. Electronically Signed   By: Ronney Asters M.D.   On: 08/16/2022 21:11   DG Chest 2 View  Result Date: 08/16/2022 CLINICAL DATA:  Shortness of breath. EXAM: CHEST - 2 VIEW COMPARISON:  11/29/2021 and prior studies FINDINGS: The cardiomediastinal silhouette is unremarkable. Mild peribronchial thickening is unchanged. There is no evidence of focal airspace disease, pulmonary edema, suspicious pulmonary nodule/mass,  pleural effusion, or pneumothorax. No acute bony abnormalities are identified. IMPRESSION: No active cardiopulmonary disease. Mild chronic peribronchial thickening. Electronically Signed   By: Margarette Canada M.D.   On: 08/16/2022 16:54        Scheduled Meds:  arformoterol  15 mcg Nebulization BID   budesonide (PULMICORT) nebulizer solution  0.25 mg Nebulization BID   calcium carbonate  1 tablet Oral TID WC   DULoxetine  60 mg Oral Daily   famotidine  20 mg Oral QPM   flecainide  100 mg Oral QPM   fluconazole  100 mg Oral Daily   folic acid  1 mg Oral Daily   guaiFENesin  1,200 mg Oral BID   ipratropium-albuterol  3 mL Nebulization TID   methylPREDNISolone (SOLU-MEDROL) injection  40 mg Intravenous Daily   nebivolol  5 mg Oral Daily   rivaroxaban  20 mg Oral QPM   Continuous Infusions:  azithromycin Stopped (08/18/22 1030)   cefTRIAXone (ROCEPHIN)  IV Stopped (08/18/22 0850)   lactated ringers 100 mL/hr at 08/18/22 1100     LOS: 0 days    Time spent: 35 minutes    Kailynn Satterly A Teren Zurcher, MD [facility]   If 7PM-7AM, please contact night-coverage [email or web address]  08/18/2022, 12:46 PM

## 2022-08-18 NOTE — Telephone Encounter (Signed)
Pt called in to cancel tomorrow appt... Pt stated that she is in the hospital... Pt would like to reschedule appt... Pt requesting callback

## 2022-08-18 NOTE — Progress Notes (Signed)
PHARMACY - PHYSICIAN COMMUNICATION CRITICAL VALUE ALERT - BLOOD CULTURE IDENTIFICATION (BCID)  Monique Clark is an 56 y.o. female who presented to [facility] on 08/16/2022 with a chief complaint of acute respiratory failure   Assessment: 1/4 aerobic bottles with gram stain of gram positive rods   Name of physician (or Provider) Contacted: Dr. Tyrell Antonio, MD   Current antibiotics: Ceftriaxone 2g IV Q24H, Azithromycin 500 mg IV daily   Changes to prescribed antibiotics recommended:   Likely contaminant; continue current antibiotic regimen.     Gena Fray, PharmD PGY1 Pharmacy Resident   08/18/2022 10:06 AM

## 2022-08-19 ENCOUNTER — Encounter: Payer: 59 | Admitting: Physical Medicine and Rehabilitation

## 2022-08-19 ENCOUNTER — Ambulatory Visit: Payer: 59 | Admitting: Internal Medicine

## 2022-08-19 DIAGNOSIS — J9601 Acute respiratory failure with hypoxia: Secondary | ICD-10-CM | POA: Diagnosis not present

## 2022-08-19 LAB — BASIC METABOLIC PANEL
Anion gap: 10 (ref 5–15)
BUN: 17 mg/dL (ref 6–20)
CO2: 28 mmol/L (ref 22–32)
Calcium: 9.4 mg/dL (ref 8.9–10.3)
Chloride: 103 mmol/L (ref 98–111)
Creatinine, Ser: 1.02 mg/dL — ABNORMAL HIGH (ref 0.44–1.00)
GFR, Estimated: >60 mL/min (ref >60)
Glucose, Bld: 118 mg/dL — ABNORMAL HIGH (ref 70–99)
Potassium: 3.8 mmol/L (ref 3.5–5.1)
Sodium: 141 mmol/L (ref 135–145)

## 2022-08-19 LAB — CBC
HCT: 34.7 % — ABNORMAL LOW (ref 36.0–46.0)
Hemoglobin: 11.2 g/dL — ABNORMAL LOW (ref 12.0–15.0)
MCH: 29 pg (ref 26.0–34.0)
MCHC: 32.3 g/dL (ref 30.0–36.0)
MCV: 89.9 fL (ref 80.0–100.0)
Platelets: 395 10*3/uL (ref 150–400)
RBC: 3.86 MIL/uL — ABNORMAL LOW (ref 3.87–5.11)
RDW: 17.9 % — ABNORMAL HIGH (ref 11.5–15.5)
WBC: 19.9 10*3/uL — ABNORMAL HIGH (ref 4.0–10.5)
nRBC: 0 % (ref 0.0–0.2)

## 2022-08-19 LAB — STREP PNEUMONIAE URINARY ANTIGEN: Strep Pneumo Urinary Antigen: NEGATIVE

## 2022-08-19 MED ORDER — BENZONATATE 100 MG PO CAPS: 200.0000 mg | ORAL_CAPSULE | Freq: Two times a day (BID) | ORAL | Status: DC | PRN

## 2022-08-19 MED ORDER — VANCOMYCIN HCL 1750 MG/350ML IV SOLN
1750.0000 mg | INTRAVENOUS | Status: DC
  Filled 2022-08-19: qty 350

## 2022-08-19 MED ORDER — IPRATROPIUM-ALBUTEROL 0.5-2.5 (3) MG/3ML IN SOLN: 3.0000 mL | Freq: Four times a day (QID) | RESPIRATORY_TRACT | Status: DC | PRN

## 2022-08-19 MED ORDER — CHILDRENS CHEW MULTIVITAMIN PO CHEW
1.0000 | CHEWABLE_TABLET | Freq: Every day | ORAL | Status: DC
  Administered 2022-08-19 – 2022-08-20 (×2): 1 via ORAL
  Filled 2022-08-19 (×2): qty 1

## 2022-08-19 NOTE — Progress Notes (Addendum)
PROGRESS NOTE    Monique Clark  DPO:[id number] DOB: 07/26/1966 DOA: 08/16/2022 PCP: Hoyt Koch, MD   Brief Narrative: 56 year old with past medical history significant for PMR on methotrexate weekly and daily prednisone, C VID on IVIG every 2 weeks, hypertension, paroxysmal A-fib, on Xarelto, sleeve gastrectomy, morbid obesity presented with increasing shortness of breath.  Symptoms started a week ago with sore throat after her daughter had a viral upper respiratory infection.  She has developed nonproductive cough and wheezing.  Her oxygen saturation was noted to drop to 86% on room air on ambulation.  CT angio was negative for PE.  Patient admitted for acute asthma bronchitis. She was found to have lactic acidosis and concern for infection. Blood culture was growing gram  positive Rods. ID consulted. Recommend last day antibiotics today. Blood culture likely contaminate. Patient was found to be positive for rhinovirus.    Assessment & Plan:   Principal Problem:   Acute respiratory failure with hypoxia (HCC) Active Problems:   CVID (common variable immunodeficiency) (HCC)   Morbid obesity with body mass index (BMI) of 50.0 to 59.9 in adult [facility])   History of non-Hodgkin's lymphoma   Paroxysmal atrial fibrillation (HCC)   PMR (polymyalgia rheumatica) (HCC)   OSA on CPAP   Pulmonary nodule   1-Acute Respiratory Failure with Hypoxia: Patient was found to have oxygen saturation 86 on ambulation, required 2 L.  CTA negative for pneumonia. Suspect related to acute bronchitis, Asthma exacerbation: Continue with scheduled DuoNeb, Continue with IVF steroids She probably had a recent upper respiratory infection, she was cover for bacterial infection with 3 days of ceftriaxone and Azithro due to her history of immunodeficiency.  -Nocturnal Oxygen sat. 14 episodes of hypoxia. Nocturnal home oxygen ordered.  -1/4 Blood culture positive for Diphtheroid.  ID has been consulted.  Likely contaminate.  -Continue  Pulmicort and brovana -Rhinovirus positive.  Continue with support treatment.   2-Lactic Acidosis:  Could be related to infection Vs decrease perfusion from asthma exacerbation, hypoxia.  Received IV fluids. Received 3 days IV antibiotics.  CT abdomen pelvis, Omentum ischemia on CT chest. Discussed with radiology omental ischemia likely post Sx, incidental finding.  CT abdomen pelvis negative for infection process.  Lactic acid peak to 3.3. down to 2.   3-OSA on CPAP: Continue with nightly CPAP PMR: Methotrexate weekly. She is on 10 mg of prednisone at home  4-Paroxysmal A-fib: Continue with Xarelto flecainide and nebivolol  History of non-Hodgkin lymphoma: In remission  Morbid obesity with body mass index 50 status post lap sleeve gastrectomy for weight management She has lost more than 45 pounds  Estimated body mass index is 47.4 kg/m as calculated from the following:   Height as of this encounter: '5\' 5"'$  (1.651 m).   Weight as of this encounter: 129.2 kg.   DVT prophylaxis: Xarelto Code Status: Full code Family Communication: Care discussed with patient Disposition Plan:  Status is: Observation The patient remains OBS appropriate and will d/c before 2 midnights. Hopefully home 11/22   Consultants:    Procedures:    Antimicrobials:    Subjective: Cough is not worse, is not constant.  Denies left upper quadrant pain.    Objective: Vitals:   08/19/22 0749 08/19/22 0750 08/19/22 0751 08/19/22 1227  BP: 125/65   (!) 146/79  Pulse: 67   71  Resp: 15   20  Temp: 98.3 F (36.8 C)   99.7 F (37.6 C)  TempSrc: Oral   Oral  SpO2:  95% 98% 99% 96%  Weight:      Height:        Intake/Output Summary (Last 24 hours) at 08/19/2022 1427 Last data filed at 08/19/2022 1020 Gross per 24 hour  Intake 2593.55 ml  Output --  Net 2593.55 ml    Filed Weights   08/17/22 1535  Weight: 129.2 kg    Examination:  General exam:  NAD Respiratory system: Less Wheezing on lung exam.  Cardiovascular system: S 1, S 2 RRR Gastrointestinal system: BS present, soft, nt Extremities: No edema  Data Reviewed: I have personally reviewed following labs and imaging studies  CBC: Recent Labs  Lab 08/16/22 1632 08/17/22 1617 08/19/22 0311  WBC 15.5* 15.9* 19.9*  NEUTROABS 11.8*  --   --   HGB 13.1 12.5 11.2*  HCT 40.4 38.1 34.7*  MCV 89.0 88.2 89.9  PLT 445* 425* 469    Basic Metabolic Panel: Recent Labs  Lab 08/16/22 1632 08/17/22 1617 08/18/22 0252 08/19/22 0311  NA 137 140 142 141  K 4.2 3.9 4.3 3.8  CL 101 106 107 103  CO2 23 21* 25 28  GLUCOSE 153* 156* 101* 118*  BUN '20 17 17 17  '$ CREATININE 0.89 0.86 0.86 1.02*  CALCIUM 9.6 9.5 9.3 9.4    GFR: Estimated Creatinine Clearance: 83.5 mL/min (A) (by C-G formula based on SCr of 1.02 mg/dL (H)). Liver Function Tests: Recent Labs  Lab 08/16/22 1632  AST 31  ALT 22  ALKPHOS 70  BILITOT 0.2*  PROT 6.8  ALBUMIN 3.6    No results for input(s): "LIPASE", "AMYLASE" in the last 168 hours. No results for input(s): "AMMONIA" in the last 168 hours. Coagulation Profile: No results for input(s): "INR", "PROTIME" in the last 168 hours. Cardiac Enzymes: No results for input(s): "CKTOTAL", "CKMB", "CKMBINDEX", "TROPONINI" in the last 168 hours. BNP (last 3 results) No results for input(s): "PROBNP" in the last 8760 hours. HbA1C: No results for input(s): "HGBA1C" in the last 72 hours. CBG: No results for input(s): "GLUCAP" in the last 168 hours. Lipid Profile: No results for input(s): "CHOL", "HDL", "LDLCALC", "TRIG", "CHOLHDL", "LDLDIRECT" in the last 72 hours. Thyroid Function Tests: No results for input(s): "TSH", "T4TOTAL", "FREET4", "T3FREE", "THYROIDAB" in the last 72 hours. Anemia Panel: No results for input(s): "VITAMINB12", "FOLATE", "FERRITIN", "TIBC", "IRON", "RETICCTPCT" in the last 72 hours. Sepsis Labs: Recent Labs  Lab 08/18/22 1227  08/18/22 1534 08/18/22 1852 08/18/22 2108  LATICACIDVEN 2.5* 3.3* 2.2* 1.9     Recent Results (from the past 240 hour(s))  Blood culture (routine x 2)     Status: None (Preliminary result)   Collection Time: 08/16/22  5:29 PM   Specimen: BLOOD  Result Value Ref Range Status   Specimen Description BLOOD SITE NOT SPECIFIED  Final   Special Requests   Final    BOTTLES DRAWN AEROBIC AND ANAEROBIC Blood Culture results may not be optimal due to an inadequate volume of blood received in culture bottles   Culture   Final    NO GROWTH 3 DAYS Performed at [facility], [place] [address] [place], [place] [address]    Report Status PENDING  Incomplete  Resp Panel by RT-PCR (Flu A&B, Covid) Anterior Nasal Swab     Status: None   Collection Time: 08/16/22  5:34 PM   Specimen: Anterior Nasal Swab  Result Value Ref Range Status   SARS Coronavirus 2 by RT PCR NEGATIVE NEGATIVE Final    Comment: (NOTE) SARS-CoV-2 target  nucleic acids are NOT DETECTED.  The SARS-CoV-2 RNA is generally detectable in upper respiratory specimens during the acute phase of infection. The lowest concentration of SARS-CoV-2 viral copies this assay can detect is 138 copies/mL. A negative result does not preclude SARS-Cov-2 infection and should not be used as the sole basis for treatment or other patient management decisions. A negative result may occur with  improper specimen collection/handling, submission of specimen other than nasopharyngeal swab, presence of viral mutation(s) within the areas targeted by this assay, and inadequate number of viral copies(<138 copies/mL). A negative result must be combined with clinical observations, patient history, and epidemiological information. The expected result is Negative.  Fact Sheet for Patients:  EntrepreneurPulse.com.au  Fact Sheet for Healthcare Providers:  IncredibleEmployment.be  This test is no t yet approved or cleared  by the [place] FDA and  has been authorized for detection and/or diagnosis of SARS-CoV-2 by FDA under an Emergency Use Authorization (EUA). This EUA will remain  in effect (meaning this test can be used) for the duration of the COVID-19 declaration under Section 564(b)(1) of the Act, 21 U.S.C.section 360bbb-3(b)(1), unless the authorization is terminated  or revoked sooner.       Influenza A by PCR NEGATIVE NEGATIVE Final   Influenza B by PCR NEGATIVE NEGATIVE Final    Comment: (NOTE) The Xpert Xpress SARS-CoV-2/FLU/RSV plus assay is intended as an aid in the diagnosis of influenza from Nasopharyngeal swab specimens and should not be used as a sole basis for treatment. Nasal washings and aspirates are unacceptable for Xpert Xpress SARS-CoV-2/FLU/RSV testing.  Fact Sheet for Patients: EntrepreneurPulse.com.au  Fact Sheet for Healthcare Providers: IncredibleEmployment.be  This test is not yet approved or cleared by the [place] FDA and has been authorized for detection and/or diagnosis of SARS-CoV-2 by FDA under an Emergency Use Authorization (EUA). This EUA will remain in effect (meaning this test can be used) for the duration of the COVID-19 declaration under Section 564(b)(1) of the Act, 21 U.S.C. section 360bbb-3(b)(1), unless the authorization is terminated or revoked.  Performed at [facility], [place] [address], [place], [place] [address]   Blood culture (routine x 2)     Status: Abnormal   Collection Time: 08/16/22 10:20 PM   Specimen: BLOOD RIGHT FOREARM  Result Value Ref Range Status   Specimen Description BLOOD RIGHT FOREARM  Final   Special Requests   Final    BOTTLES DRAWN AEROBIC AND ANAEROBIC Blood Culture results may not be optimal due to an inadequate volume of blood received in culture bottles   Culture  Setup Time   Final    GRAM POSITIVE RODS AEROBIC BOTTLE ONLY CRITICAL RESULT CALLED TO, READ BACK BY  AND VERIFIED WITHMikel Cella, AT 3335 08/18/22 D. VANHOOK    Culture (A)  Final    DIPHTHEROIDS(CORYNEBACTERIUM SPECIES) Standardized susceptibility testing for this organism is not available. Performed at [facility], [place] [address], [place], [place] [address]    Report Status 08/19/2022 FINAL  Final  Respiratory (~20 pathogens) panel by PCR     Status: Abnormal   Collection Time: 08/18/22  3:28 PM   Specimen: Nasopharyngeal Swab; Respiratory  Result Value Ref Range Status   Adenovirus NOT DETECTED NOT DETECTED Final   Coronavirus 229E NOT DETECTED NOT DETECTED Final    Comment: (NOTE) The Coronavirus on the Respiratory Panel, DOES NOT test for the novel  Coronavirus (2019 nCoV)    Coronavirus HKU1 NOT DETECTED NOT DETECTED  Final   Coronavirus NL63 NOT DETECTED NOT DETECTED Final   Coronavirus OC43 NOT DETECTED NOT DETECTED Final   Metapneumovirus NOT DETECTED NOT DETECTED Final   Rhinovirus / Enterovirus DETECTED (A) NOT DETECTED Final   Influenza A NOT DETECTED NOT DETECTED Final   Influenza B NOT DETECTED NOT DETECTED Final   Parainfluenza Virus 1 NOT DETECTED NOT DETECTED Final   Parainfluenza Virus 2 NOT DETECTED NOT DETECTED Final   Parainfluenza Virus 3 NOT DETECTED NOT DETECTED Final   Parainfluenza Virus 4 NOT DETECTED NOT DETECTED Final   Respiratory Syncytial Virus NOT DETECTED NOT DETECTED Final   Bordetella pertussis NOT DETECTED NOT DETECTED Final   Bordetella Parapertussis NOT DETECTED NOT DETECTED Final   Chlamydophila pneumoniae NOT DETECTED NOT DETECTED Final   Mycoplasma pneumoniae NOT DETECTED NOT DETECTED Final    Comment: Performed at [facility], [place] [address] [place], [place] [address]         Radiology Studies: CT ABDOMEN PELVIS W CONTRAST  Result Date: 08/18/2022 CLINICAL DATA:  Sepsis EXAM: CT ABDOMEN AND PELVIS WITH CONTRAST TECHNIQUE: Multidetector CT imaging of the abdomen and pelvis was performed using the standard  protocol following bolus administration of intravenous contrast. RADIATION DOSE REDUCTION: This exam was performed according to the departmental dose-optimization program which includes automated exposure control, adjustment of the mA and/or kV according to patient size and/or use of iterative reconstruction technique. CONTRAST:  179m OMNIPAQUE IOHEXOL 350 MG/ML SOLN COMPARISON:  01/15/2022 FINDINGS: Lower chest: No acute abnormality Hepatobiliary: No focal liver abnormality is seen. Status post cholecystectomy. No biliary dilatation. Pancreas: No focal abnormality or ductal dilatation. Spleen: Prior splenectomy. Adrenals/Urinary Tract: No adrenal abnormality. No focal renal abnormality. No stones or hydronephrosis. Urinary bladder is unremarkable. Stomach/Bowel: Few sigmoid diverticula. No active diverticulitis. Prior gastric sleeve. No evidence of bowel obstruction. Vascular/Lymphatic: No evidence of aneurysm or adenopathy. Reproductive: Obscured by beam hardening artifact from bilateral hip replacements. Other: No free fluid or free air. Musculoskeletal: No acute bony abnormality. Bilateral hip replacements. IMPRESSION: No acute findings in the abdomen or pelvis. Electronically Signed   By: KRolm BaptiseM.D.   On: 08/18/2022 23:54        Scheduled Meds:  arformoterol  15 mcg Nebulization BID   budesonide (PULMICORT) nebulizer solution  0.25 mg Nebulization BID   calcium carbonate  1 tablet Oral TID WC   childrens multivitamin  1 tablet Oral Daily   DULoxetine  60 mg Oral Daily   famotidine  20 mg Oral QPM   flecainide  100 mg Oral QPM   folic acid  1 mg Oral Daily   guaiFENesin  1,200 mg Oral BID   methylPREDNISolone (SOLU-MEDROL) injection  40 mg Intravenous Daily   nebivolol  5 mg Oral Daily   rivaroxaban  20 mg Oral QPM   Continuous Infusions:  lactated ringers 100 mL/hr at 08/19/22 0836   vancomycin 2,000 mg (08/18/22 1948)     LOS: 1 day    Time spent: 35 minutes    Kellar Westberg A  Maiana Hennigan, MD [facility]   If 7PM-7AM, please contact night-coverage [email or web address]  08/19/2022, 2:27 PM

## 2022-08-19 NOTE — Progress Notes (Signed)
Pt places herself on home CPAP unit with 2L bled in.

## 2022-08-19 NOTE — Progress Notes (Signed)
[facility]    Date of Admission:  08/16/2022      ID: Monique Clark is a 56 y.o. female with  Principal Problem:   Acute respiratory failure with hypoxia ([place]) Active Problems:   CVID (common variable immunodeficiency) ([place])   Morbid obesity with body mass index (BMI) of 50.0 to 59.9 in adult [facility])   History of non-Hodgkin's lymphoma   Paroxysmal atrial fibrillation (HCC)   PMR (polymyalgia rheumatica) (HCC)   OSA on CPAP   Pulmonary nodule    Subjective: Afebrile. Feeling slightly better. Less cough.  Work up + rhinovirus on RVP  Medications:   arformoterol  15 mcg Nebulization BID   budesonide (PULMICORT) nebulizer solution  0.25 mg Nebulization BID   calcium carbonate  1 tablet Oral TID WC   childrens multivitamin  1 tablet Oral Daily   DULoxetine  60 mg Oral Daily   famotidine  20 mg Oral QPM   flecainide  100 mg Oral QPM   folic acid  1 mg Oral Daily   guaiFENesin  1,200 mg Oral BID   methylPREDNISolone (SOLU-MEDROL) injection  40 mg Intravenous Daily   nebivolol  5 mg Oral Daily   rivaroxaban  20 mg Oral QPM    Objective: Vital signs in last 24 hours: Temp:  [97.9 F (36.6 C)-99.7 F (37.6 C)] 98.6 F (37 C) (11/21 1614) Pulse Rate:  [58-71] 64 (11/21 1614) Resp:  [15-25] 25 (11/21 1614) BP: (125-155)/(65-98) 137/75 (11/21 1614) SpO2:  [93 %-99 %] 94 % (11/21 1614)  Physical Exam  Constitutional:  oriented to person, place, and time. appears well-developed and well-nourished. No distress.  HENT: [place]/AT, PERRLA, no scleral icterus Mouth/Throat: Oropharynx is clear and moist. No oropharyngeal exudate.  Cardiovascular: Normal rate, regular rhythm and normal heart sounds. Exam reveals no gallop and no friction rub.  No murmur heard.  Pulmonary/Chest: Effort normal and breath sounds normal. +wheeze bilaterally Neck = supple, no nuchal rigidity Abdominal: Soft. Bowel sounds are normal.  exhibits no distension. There is no  tenderness.  Lymphadenopathy: no cervical adenopathy. No axillary adenopathy Neurological: alert and oriented to person, place, and time.  Skin: Skin is warm and dry. No rash noted. No erythema.  Psychiatric: a normal mood and affect.  behavior is normal.    Lab Results Recent Labs    08/17/22 1617 08/18/22 0252 08/19/22 0311  WBC 15.9*  --  19.9*  HGB 12.5  --  11.2*  HCT 38.1  --  34.7*  NA 140 142 141  K 3.9 4.3 3.8  CL 106 107 103  CO2 21* 25 28  BUN '17 17 17  '$ CREATININE 0.86 0.86 1.02*   Liver Panel No results for input(s): "PROT", "ALBUMIN", "AST", "ALT", "ALKPHOS", "BILITOT", "BILIDIR", "IBILI" in the last 72 hours. Sedimentation Rate No results for input(s): "ESRSEDRATE" in the last 72 hours. C-Reactive Protein No results for input(s): "CRP" in the last 72 hours.  Microbiology: reviewed Studies/Results: CT ABDOMEN PELVIS W CONTRAST  Result Date: 08/18/2022 CLINICAL DATA:  Sepsis EXAM: CT ABDOMEN AND PELVIS WITH CONTRAST TECHNIQUE: Multidetector CT imaging of the abdomen and pelvis was performed using the standard protocol following bolus administration of intravenous contrast. RADIATION DOSE REDUCTION: This exam was performed according to the departmental dose-optimization program which includes automated exposure control, adjustment of the mA and/or kV according to patient size and/or use of iterative reconstruction technique. CONTRAST:  133m OMNIPAQUE IOHEXOL 350 MG/ML SOLN COMPARISON:  01/15/2022 FINDINGS: Lower chest:  No acute abnormality Hepatobiliary: No focal liver abnormality is seen. Status post cholecystectomy. No biliary dilatation. Pancreas: No focal abnormality or ductal dilatation. Spleen: Prior splenectomy. Adrenals/Urinary Tract: No adrenal abnormality. No focal renal abnormality. No stones or hydronephrosis. Urinary bladder is unremarkable. Stomach/Bowel: Few sigmoid diverticula. No active diverticulitis. Prior gastric sleeve. No evidence of bowel  obstruction. Vascular/Lymphatic: No evidence of aneurysm or adenopathy. Reproductive: Obscured by beam hardening artifact from bilateral hip replacements. Other: No free fluid or free air. Musculoskeletal: No acute bony abnormality. Bilateral hip replacements. IMPRESSION: No acute findings in the abdomen or pelvis. Electronically Signed   By: Rolm Baptise M.D.   On: 08/18/2022 23:54     Assessment/Plan: Viral pneumonia = will stop ceftriaxone, azithromycin since she has finished 3 day course  GPR bacteremia = consistent with diptheroids. Will stop vancomycin  Continue with supportive care  Leukocytosis = dur to pulse dose steroids  Will sign off.  [facility] Pager: [phone]  08/19/2022, 4:45 PM

## 2022-08-19 NOTE — Plan of Care (Signed)

## 2022-08-20 ENCOUNTER — Other Ambulatory Visit (HOSPITAL_COMMUNITY): Payer: Self-pay

## 2022-08-20 LAB — BLOOD CULTURE ID PANEL (REFLEXED) - BCID2

## 2022-08-20 LAB — LEGIONELLA PNEUMOPHILA SEROGP 1 UR AG: L. pneumophila Serogp 1 Ur Ag: NEGATIVE

## 2022-08-20 MED ORDER — PREDNISONE 10 MG PO TABS
40.0000 mg | ORAL_TABLET | Freq: Every day | ORAL | 0 refills | Status: DC
  Filled 2022-08-20: qty 16, 4d supply, fill #0

## 2022-08-20 NOTE — TOC Progression Note (Addendum)
Transition of Care [facility]) - Progression Note    Patient Details  Name: Monique Clark MRN: [id number] Date of Birth: 11/27/1965  Transition of Care [facility]) CM/SW Contact  Royston Bake, RN,MHA,CCM Transition of Care Supervisor Phone Number: [phone] 08/20/2022, 2:55 PM  Clinical Narrative:    Patient will be discharged on home oxygen. Rica Koyanagi with [employer] notified. Concentrator to be delivered to the patient's home for oxygen at HS.  2:59 pm - Received message that 02 concentrator was delivered to the home yesterday.  Mindi Slicker RN,MHA,CCM   Expected Discharge Plan: Home/Self Care Barriers to Discharge: Continued Medical Work up  Expected Discharge Plan and Services Expected Discharge Plan: Home/Self Care   Discharge Planning Services: CM Consult Post Acute Care Choice: Durable Medical Equipment Living arrangements for the past 2 months: Single Family Home Expected Discharge Date: 08/20/22               DME Arranged: Oxygen DME Agency: [employer] Date DME Agency Contacted: 08/18/22 Time DME Agency Contacted: 54 Representative spoke with at DME Agency: [place]             Social Determinants of Health ([place]) Interventions    Readmission Risk Interventions     No data to display

## 2022-08-20 NOTE — Telephone Encounter (Signed)
Left message for pt to call back  °

## 2022-08-20 NOTE — Progress Notes (Signed)
PHARMACY - PHYSICIAN COMMUNICATION CRITICAL VALUE ALERT - BLOOD CULTURE IDENTIFICATION (BCID)  Monique Clark is an 56 y.o. female who presented to [facility] on 08/16/2022 with a chief complaint of shortness of breath  Assessment:  One bottle had GPR's, another bottle had GPC in chains with no BCID detection  Name of physician (or Provider) Contacted: Dr. Myna Hidalgo  Current antibiotics: None  Changes to prescribed antibiotics recommended:  Anti-biotics stopped by ID yesteday Will cont to monitor off anti-biotics   Narda Bonds, PharmD, [place] Pharmacist Phone: [phone]

## 2022-08-20 NOTE — Discharge Summary (Signed)
Monique Clark LTJ:[id number] DOB: 1966/06/21 DOA: 08/16/2022  PCP: Hoyt Koch, MD  Admit date: 08/16/2022 Discharge date: 08/20/2022  Time spent: 35 minutes  Recommendations for Outpatient Follow-up:  Pcp f/u     Discharge Diagnoses:  Principal Problem:   Acute respiratory failure with hypoxia ([place]) Active Problems:   CVID (common variable immunodeficiency) ([place])   Morbid obesity with body mass index (BMI) of 50.0 to 59.9 in adult [facility])   History of non-Hodgkin's lymphoma   Essential hypertension   Paroxysmal atrial fibrillation ([facility])   PMR (polymyalgia rheumatica) ([facility])   OSA on CPAP   Pulmonary nodule   Discharge Condition: improved  Diet recommendation: heart healthy  Filed Weights   08/17/22 1535  Weight: 129.2 kg    History of present illness:  From admission h and p Monique Clark is a 56 y.o. female with medical history significant of PMR on methotrexate weekly and daily prednisone, CVID on IVIG q2weeks,  HTN, paroxysmal atrial fibrillation on Xarelto, sleeve gastrectomy, morbid obesity who presents with increasing shortness of breath.    Pt started to have sore throat about a week after daughter was sick with viral URI. Then had 2 days of weakness and fatigued. After that had non-productive cough with increase wheezing.Pt works as Tourist information centre manager here at [employer] and today and increase shortness of breath with ambulation with O2 sats dropping to 86% and decided to be evaluated. She used albuterol inhaler about 6 times today without relieve. Has past tobacco use hx 20 years ago.     Hospital Course:  Patient presented with over a week worsening shortness of breath. Here CTA was negative for PE. Respiratory panel positive for rhinovirus. Covid/flu negative. Patient was initially treated for possible CAP with ceftriaxone/azithromycin, received a total of 3 days treatment. Had 1/4 blood cultures positive with a likely contaminant. Was also hypoxic to 40  on arrival. Was treated with [place] O2 which was weaned off but we did identify nighttime hypoxia - home O2 arranged. Breathing comfortably on room air day of discharge. Will treat with 4 more days oral steroids for viral-induced asthma exacerbation, also advised scheduled albuterol.  Procedures: none   Consultations: ID  Discharge Exam: Vitals:   08/20/22 0924 08/20/22 1204  BP:  (!) 155/90  Pulse:    Resp:  20  Temp:  98.2 F (36.8 C)  SpO2: 94%     General: NAD Cardiovascular: RRR Respiratory: clear  Discharge Instructions   Discharge Instructions     Diet - low sodium heart healthy   Complete by: As directed    Increase activity slowly   Complete by: As directed       Allergies as of 08/20/2022       Reactions   Tetanus-diphtheria Toxoids Td Swelling   angioedema   Nizatidine Hives   Axid   Promethazine Other (See Comments)   High doses causes confusion   Tetanus Toxoids Other (See Comments)   angioedema        Medication List     TAKE these medications    acetaminophen 325 MG tablet Commonly known as: TYLENOL Take 650 mg by mouth every 6 (six) hours as needed for moderate pain.   albuterol 108 (90 Base) MCG/ACT inhaler Commonly known as: VENTOLIN HFA Inhale 1 puff into the lungs every 6 (six) hours as needed for shortness of breath or wheezing.   cetirizine 10 MG tablet Commonly known as: ZYRTEC Take 1 tablet (10 mg total) by mouth daily.  diphenhydrAMINE 25 MG tablet Commonly known as: BENADRYL Take 25-50 mg by mouth See admin instructions. Take 25 mg by mouth as needed for allergies or itching and take 50 mg by mouth with Hizentra infusion   DULoxetine 60 MG capsule Commonly known as: Cymbalta Take 1 capsule (60 mg total) by mouth daily.   famotidine 20 MG tablet Commonly known as: PEPCID Take 20 mg by mouth every evening.   flecainide 100 MG tablet Commonly known as: TAMBOCOR Take 1 tablet by mouth every 12 hours What changed:   how much to take how to take this when to take this   fluticasone 50 MCG/ACT nasal spray Commonly known as: FLONASE Place 2 sprays into both nostrils daily as needed for allergies.   folic acid 1 MG tablet Commonly known as: FOLVITE Take 1 tablet (1 mg total) by mouth daily.   gabapentin 100 MG capsule Commonly known as: NEURONTIN Take 1 capsule by mouth every night, may increase to 1 by mouth 3 times a day if needed. What changed:  how much to take how to take this when to take this   ibuprofen 200 MG tablet Commonly known as: ADVIL Take 600 mg by mouth every 6 (six) hours as needed for mild pain or moderate pain.   Immune Globulin (Human) 4 GM/20ML Soln Inject 36 g into the skin every 14 (fourteen) days. *Tuesday   methocarbamol 500 MG tablet Commonly known as: Robaxin Take 1 tablet (500 mg total) by mouth every 6 (six) hours as needed for muscle spasms.   methotrexate 2.5 MG tablet Commonly known as: RHEUMATREX Take 6 tablets (15 mg total) by mouth once a week. Caution:Chemotherapy. Protect from light. What changed: additional instructions   montelukast 10 MG tablet Commonly known as: Singulair Take 1 tablet (10 mg total) by mouth at bedtime.   multivitamin with minerals Tabs tablet Take 1 tablet by mouth daily.   nebivolol 5 MG tablet Commonly known as: Bystolic Take 1 tablet (5 mg total) by mouth daily.   NON FORMULARY Pt uses a cpap nightly   nystatin-triamcinolone ointment Commonly known as: MYCOLOG Apply 1 application topically 2 (two) times daily. What changed:  when to take this reasons to take this   ondansetron 4 MG disintegrating tablet Commonly known as: ZOFRAN-ODT Dissolve 1 tablet (4 mg total) by mouth every 6 (six) hours as needed for nausea or vomiting.   predniSONE 10 MG tablet Commonly known as: DELTASONE Take 4 tablets (40 mg total) by mouth daily with breakfast. For 4 days then return to your normal dose What changed:  how much  to take additional instructions   traMADol 50 MG tablet Commonly known as: ULTRAM Take 1 tablet (50 mg total) by mouth every 12 (twelve) hours as needed. What changed: reasons to take this   Tums 500 MG chewable tablet Generic drug: calcium carbonate Chew 1 tablet by mouth 3 (three) times daily.   Vitamin D3 1.25 MG (50000 UT) Caps TAKE 1 CAPSULE BY MOUTH TWICE A WEEK AS DIRECTED What changed:  how much to take how to take this when to take this additional instructions   Xarelto 20 MG Tabs tablet Generic drug: rivaroxaban Take 1 tablet by mouth every day What changed:  how much to take when to take this   zolpidem 5 MG tablet Commonly known as: Ambien Take 1 tablet by mouth at bedtime as needed for sleep.  Durable Medical Equipment  (From admission, onward)           Start     Ordered   08/18/22 0839  For home use only DME oxygen  Once       Question Answer Comment  Length of Need 6 Months   Mode or (Route) Nasal cannula   Liters per Minute 3   Frequency Only at night (stationary unit needed)   Oxygen delivery system Gas      08/18/22 0838           Allergies  Allergen Reactions   Tetanus-Diphtheria Toxoids Td Swelling    angioedema   Nizatidine Hives    Axid   Promethazine Other (See Comments)    High doses causes confusion   Tetanus Toxoids Other (See Comments)    angioedema    Follow-up Information     Hoyt Koch, MD Follow up.   Specialty: Internal Medicine Contact information: [place] [address] [phone]                  The results of significant diagnostics from this hospitalization (including imaging, microbiology, ancillary and laboratory) are listed below for reference.    Significant Diagnostic Studies: CT ABDOMEN PELVIS W CONTRAST  Result Date: 08/18/2022 CLINICAL DATA:  Sepsis EXAM: CT ABDOMEN AND PELVIS WITH CONTRAST TECHNIQUE: Multidetector CT imaging of  the abdomen and pelvis was performed using the standard protocol following bolus administration of intravenous contrast. RADIATION DOSE REDUCTION: This exam was performed according to the departmental dose-optimization program which includes automated exposure control, adjustment of the mA and/or kV according to patient size and/or use of iterative reconstruction technique. CONTRAST:  171m OMNIPAQUE IOHEXOL 350 MG/ML SOLN COMPARISON:  01/15/2022 FINDINGS: Lower chest: No acute abnormality Hepatobiliary: No focal liver abnormality is seen. Status post cholecystectomy. No biliary dilatation. Pancreas: No focal abnormality or ductal dilatation. Spleen: Prior splenectomy. Adrenals/Urinary Tract: No adrenal abnormality. No focal renal abnormality. No stones or hydronephrosis. Urinary bladder is unremarkable. Stomach/Bowel: Few sigmoid diverticula. No active diverticulitis. Prior gastric sleeve. No evidence of bowel obstruction. Vascular/Lymphatic: No evidence of aneurysm or adenopathy. Reproductive: Obscured by beam hardening artifact from bilateral hip replacements. Other: No free fluid or free air. Musculoskeletal: No acute bony abnormality. Bilateral hip replacements. IMPRESSION: No acute findings in the abdomen or pelvis. Electronically Signed   By: KRolm BaptiseM.D.   On: 08/18/2022 23:54   CT Angio Chest PE W and/or Wo Contrast  Result Date: 08/16/2022 CLINICAL DATA:  High probability for PE. History of pneumonitis, previous wedge resection, cancer. Cough and wheezing. EXAM: CT ANGIOGRAPHY CHEST WITH CONTRAST TECHNIQUE: Multidetector CT imaging of the chest was performed using the standard protocol during bolus administration of intravenous contrast. Multiplanar CT image reconstructions and MIPs were obtained to evaluate the vascular anatomy. RADIATION DOSE REDUCTION: This exam was performed according to the departmental dose-optimization program which includes automated exposure control, adjustment of the mA  and/or kV according to patient size and/or use of iterative reconstruction technique. CONTRAST:  730mOMNIPAQUE IOHEXOL 350 MG/ML SOLN COMPARISON:  CT angiogram chest 03/18/2020. FINDINGS: Cardiovascular: Satisfactory opacification of the pulmonary arteries to the segmental level. No evidence of pulmonary embolism. Heart is mildly enlarged, unchanged. No pericardial effusion. Mediastinum/Nodes: No enlarged mediastinal, hilar, or axillary lymph nodes. Thyroid gland, trachea, and esophagus demonstrate no significant findings. Lungs/Pleura: Stable 4 mm right upper lobe nodule image 8/54 which is unchanged from 2021 compatible with benign etiology. There is minimal  lingular atelectasis or scarring, unchanged. Postsurgical changes are again seen in the posterior right lower lobe. The lungs are otherwise clear. No pleural effusion or pneumothorax. Upper Abdomen: Cholecystectomy clips are present. There surgical changes in the stomach. Splenectomy changes again noted. Findings likely related to omental infarct noted in the anterior abdomen, new from prior. Musculoskeletal: No chest wall abnormality. No acute or significant osseous findings. Review of the MIP images confirms the above findings. IMPRESSION: 1. No evidence for pulmonary embolism. 2. Stable mild cardiomegaly. Electronically Signed   By: Ronney Asters M.D.   On: 08/16/2022 21:11   DG Chest 2 View  Result Date: 08/16/2022 CLINICAL DATA:  Shortness of breath. EXAM: CHEST - 2 VIEW COMPARISON:  11/29/2021 and prior studies FINDINGS: The cardiomediastinal silhouette is unremarkable. Mild peribronchial thickening is unchanged. There is no evidence of focal airspace disease, pulmonary edema, suspicious pulmonary nodule/mass, pleural effusion, or pneumothorax. No acute bony abnormalities are identified. IMPRESSION: No active cardiopulmonary disease. Mild chronic peribronchial thickening. Electronically Signed   By: Margarette Canada M.D.   On: 08/16/2022 16:54     Microbiology: Recent Results (from the past 240 hour(s))  Blood culture (routine x 2)     Status: None (Preliminary result)   Collection Time: 08/16/22  5:29 PM   Specimen: BLOOD  Result Value Ref Range Status   Specimen Description BLOOD SITE NOT SPECIFIED  Final   Special Requests   Final    BOTTLES DRAWN AEROBIC AND ANAEROBIC Blood Culture results may not be optimal due to an inadequate volume of blood received in culture bottles   Culture   Final    NO GROWTH 4 DAYS Performed at [facility], [place] [address], [place], [place] [address]    Report Status PENDING  Incomplete  Resp Panel by RT-PCR (Flu A&B, Covid) Anterior Nasal Swab     Status: None   Collection Time: 08/16/22  5:34 PM   Specimen: Anterior Nasal Swab  Result Value Ref Range Status   SARS Coronavirus 2 by RT PCR NEGATIVE NEGATIVE Final    Comment: (NOTE) SARS-CoV-2 target nucleic acids are NOT DETECTED.  The SARS-CoV-2 RNA is generally detectable in upper respiratory specimens during the acute phase of infection. The lowest concentration of SARS-CoV-2 viral copies this assay can detect is 138 copies/mL. A negative result does not preclude SARS-Cov-2 infection and should not be used as the sole basis for treatment or other patient management decisions. A negative result may occur with  improper specimen collection/handling, submission of specimen other than nasopharyngeal swab, presence of viral mutation(s) within the areas targeted by this assay, and inadequate number of viral copies(<138 copies/mL). A negative result must be combined with clinical observations, patient history, and epidemiological information. The expected result is Negative.  Fact Sheet for Patients:  EntrepreneurPulse.com.au  Fact Sheet for Healthcare Providers:  IncredibleEmployment.be  This test is no t yet approved or cleared by the [place] FDA and  has been authorized for detection  and/or diagnosis of SARS-CoV-2 by FDA under an Emergency Use Authorization (EUA). This EUA will remain  in effect (meaning this test can be used) for the duration of the COVID-19 declaration under Section 564(b)(1) of the Act, 21 U.S.C.section 360bbb-3(b)(1), unless the authorization is terminated  or revoked sooner.       Influenza A by PCR NEGATIVE NEGATIVE Final   Influenza B by PCR NEGATIVE NEGATIVE Final    Comment: (NOTE) The Xpert Xpress SARS-CoV-2/FLU/RSV plus assay is intended as an  aid in the diagnosis of influenza from Nasopharyngeal swab specimens and should not be used as a sole basis for treatment. Nasal washings and aspirates are unacceptable for Xpert Xpress SARS-CoV-2/FLU/RSV testing.  Fact Sheet for Patients: EntrepreneurPulse.com.au  Fact Sheet for Healthcare Providers: IncredibleEmployment.be  This test is not yet approved or cleared by the [place] FDA and has been authorized for detection and/or diagnosis of SARS-CoV-2 by FDA under an Emergency Use Authorization (EUA). This EUA will remain in effect (meaning this test can be used) for the duration of the COVID-19 declaration under Section 564(b)(1) of the Act, 21 U.S.C. section 360bbb-3(b)(1), unless the authorization is terminated or revoked.  Performed at [facility], [place] [address], [place], [place] [address]   Blood culture (routine x 2)     Status: Abnormal (Preliminary result)   Collection Time: 08/16/22 10:20 PM   Specimen: BLOOD RIGHT FOREARM  Result Value Ref Range Status   Specimen Description BLOOD RIGHT FOREARM  Final   Special Requests   Final    BOTTLES DRAWN AEROBIC AND ANAEROBIC Blood Culture results may not be optimal due to an inadequate volume of blood received in culture bottles   Culture  Setup Time   Final    GRAM POSITIVE RODS AEROBIC BOTTLE ONLY CRITICAL RESULT CALLED TO, READ BACK BY AND VERIFIED WITHMikel Cella, AT  [id number] 08/18/22 D. VANHOOK GRAM POSITIVE COCCI IN CHAINS ANAEROBIC BOTTLE ONLY CRITICAL RESULT CALLED TO, READ BACK BY AND VERIFIED WITH: PHARMD Archie 08/20/22 @ 0004 BY AB    Culture (A)  Final    DIPHTHEROIDS(CORYNEBACTERIUM SPECIES) Standardized susceptibility testing for this organism is not available. Performed at [facility], [place] [address] [place], [place] [address]    Report Status PENDING  Incomplete  Respiratory (~20 pathogens) panel by PCR     Status: Abnormal   Collection Time: 08/18/22  3:28 PM   Specimen: Nasopharyngeal Swab; Respiratory  Result Value Ref Range Status   Adenovirus NOT DETECTED NOT DETECTED Final   Coronavirus 229E NOT DETECTED NOT DETECTED Final    Comment: (NOTE) The Coronavirus on the Respiratory Panel, DOES NOT test for the novel  Coronavirus (2019 nCoV)    Coronavirus HKU1 NOT DETECTED NOT DETECTED Final   Coronavirus NL63 NOT DETECTED NOT DETECTED Final   Coronavirus OC43 NOT DETECTED NOT DETECTED Final   Metapneumovirus NOT DETECTED NOT DETECTED Final   Rhinovirus / Enterovirus DETECTED (A) NOT DETECTED Final   Influenza A NOT DETECTED NOT DETECTED Final   Influenza B NOT DETECTED NOT DETECTED Final   Parainfluenza Virus 1 NOT DETECTED NOT DETECTED Final   Parainfluenza Virus 2 NOT DETECTED NOT DETECTED Final   Parainfluenza Virus 3 NOT DETECTED NOT DETECTED Final   Parainfluenza Virus 4 NOT DETECTED NOT DETECTED Final   Respiratory Syncytial Virus NOT DETECTED NOT DETECTED Final   Bordetella pertussis NOT DETECTED NOT DETECTED Final   Bordetella Parapertussis NOT DETECTED NOT DETECTED Final   Chlamydophila pneumoniae NOT DETECTED NOT DETECTED Final   Mycoplasma pneumoniae NOT DETECTED NOT DETECTED Final    Comment: Performed at [facility], [place]. [address], [place],  [address]     Labs: Basic Metabolic Panel: Recent Labs  Lab 08/16/22 1632 08/17/22 1617 08/18/22 0252 08/19/22 0311  NA 137 140 142 141  K 4.2  3.9 4.3 3.8  CL 101 106 107 103  CO2 23 21* 25 28  GLUCOSE 153* 156* 101* 118*  BUN 20 17  17 17  CREATININE 0.89 0.86 0.86 1.02*  CALCIUM 9.6 9.5 9.3 9.4   Liver Function Tests: Recent Labs  Lab 08/16/22 1632  AST 31  ALT 22  ALKPHOS 70  BILITOT 0.2*  PROT 6.8  ALBUMIN 3.6   No results for input(s): "LIPASE", "AMYLASE" in the last 168 hours. No results for input(s): "AMMONIA" in the last 168 hours. CBC: Recent Labs  Lab 08/16/22 1632 08/17/22 1617 08/19/22 0311  WBC 15.5* 15.9* 19.9*  NEUTROABS 11.8*  --   --   HGB 13.1 12.5 11.2*  HCT 40.4 38.1 34.7*  MCV 89.0 88.2 89.9  PLT 445* 425* 395   Cardiac Enzymes: No results for input(s): "CKTOTAL", "CKMB", "CKMBINDEX", "TROPONINI" in the last 168 hours. BNP: BNP (last 3 results) Recent Labs    08/16/22 2000  BNP 40.5    ProBNP (last 3 results) No results for input(s): "PROBNP" in the last 8760 hours.  CBG: No results for input(s): "GLUCAP" in the last 168 hours.     Signed:  Desma Maxim MD.  [facility] 08/20/2022, 1:03 PM

## 2022-08-21 LAB — CULTURE, BLOOD (ROUTINE X 2): Culture: NO GROWTH

## 2022-08-23 ENCOUNTER — Other Ambulatory Visit (HOSPITAL_COMMUNITY): Payer: Self-pay

## 2022-08-23 ENCOUNTER — Other Ambulatory Visit: Payer: Self-pay | Admitting: Surgery

## 2022-08-23 ENCOUNTER — Other Ambulatory Visit: Payer: Self-pay | Admitting: Orthopaedic Surgery

## 2022-08-23 ENCOUNTER — Other Ambulatory Visit: Payer: Self-pay | Admitting: Obstetrics and Gynecology

## 2022-08-23 DIAGNOSIS — M353 Polymyalgia rheumatica: Secondary | ICD-10-CM

## 2022-08-24 ENCOUNTER — Other Ambulatory Visit (HOSPITAL_COMMUNITY): Payer: Self-pay

## 2022-08-24 LAB — CULTURE, BLOOD (ROUTINE X 2)

## 2022-08-25 ENCOUNTER — Telehealth: Payer: Self-pay | Admitting: Physical Medicine and Rehabilitation

## 2022-08-25 ENCOUNTER — Other Ambulatory Visit: Payer: Self-pay

## 2022-08-25 ENCOUNTER — Other Ambulatory Visit (HOSPITAL_COMMUNITY): Payer: Self-pay

## 2022-08-25 MED ORDER — GABAPENTIN 100 MG PO CAPS
ORAL_CAPSULE | ORAL | 0 refills | Status: DC
  Filled 2022-08-25: qty 90, 30d supply, fill #0

## 2022-08-25 MED ORDER — TRAMADOL HCL 50 MG PO TABS
50.0000 mg | ORAL_TABLET | Freq: Two times a day (BID) | ORAL | 0 refills | Status: DC | PRN
  Filled 2022-08-25: qty 30, 15d supply, fill #0

## 2022-08-25 MED ORDER — NEBIVOLOL HCL 5 MG PO TABS
5.0000 mg | ORAL_TABLET | Freq: Every day | ORAL | 4 refills | Status: DC
  Filled 2022-08-25: qty 30, 30d supply, fill #0
  Filled 2022-10-02: qty 30, 30d supply, fill #1
  Filled 2022-10-30: qty 30, 30d supply, fill #2
  Filled 2022-12-04: qty 30, 30d supply, fill #3
  Filled 2023-01-14: qty 30, 30d supply, fill #4

## 2022-08-25 NOTE — Telephone Encounter (Signed)
Pt called to reschedule appt. Please call pt at [phone].

## 2022-08-26 ENCOUNTER — Other Ambulatory Visit (HOSPITAL_COMMUNITY): Payer: Self-pay

## 2022-08-27 NOTE — Telephone Encounter (Signed)
Pt aware, scheduled to see Dr. Henrene Pastor 10/09/22 at 3pm, she is aware of appt.

## 2022-08-27 NOTE — Telephone Encounter (Signed)
Left message for pt to cal back. 

## 2022-08-28 ENCOUNTER — Ambulatory Visit: Payer: 59 | Admitting: Internal Medicine

## 2022-08-28 VITALS — BP 120/60 | HR 65 | Temp 98.5°F | Ht 65.0 in | Wt 294.0 lb

## 2022-08-28 DIAGNOSIS — R7303 Prediabetes: Secondary | ICD-10-CM | POA: Diagnosis not present

## 2022-08-28 DIAGNOSIS — G4734 Idiopathic sleep related nonobstructive alveolar hypoventilation: Secondary | ICD-10-CM | POA: Diagnosis not present

## 2022-08-28 DIAGNOSIS — D839 Common variable immunodeficiency, unspecified: Secondary | ICD-10-CM

## 2022-08-28 NOTE — Progress Notes (Signed)
   Subjective:   Patient ID: Monique Clark, female    DOB: Sep 15, 1966, 56 y.o.   MRN: [id number]  HPI The patient is a 56 YO female coming in for hospital follow up (in for hypoxia and respiratory issues, treated with CAP coverage for 3 days, had nocturnal hypoxemia and sent home with night time oxygen, and steroid treatment). She is doing overall still tired mostly. Mild cough improving. Not really SOB.  PMH, [facility], social history reviewed and updated  Review of Systems  Constitutional:  Positive for activity change and fatigue. Negative for appetite change.  HENT: Negative.    Eyes: Negative.   Respiratory:  Positive for cough. Negative for chest tightness and shortness of breath.   Cardiovascular:  Negative for chest pain, palpitations and leg swelling.  Gastrointestinal:  Negative for abdominal distention, abdominal pain, constipation, diarrhea, nausea and vomiting.  Musculoskeletal: Negative.   Skin: Negative.   Neurological: Negative.   Psychiatric/Behavioral: Negative.      Objective:  Physical Exam Constitutional:      Appearance: She is well-developed. She is obese.  HENT:     Head: Normocephalic and atraumatic.  Cardiovascular:     Rate and Rhythm: Normal rate and regular rhythm.  Pulmonary:     Effort: Pulmonary effort is normal. No respiratory distress.     Breath sounds: Normal breath sounds. No wheezing or rales.  Abdominal:     General: Bowel sounds are normal. There is no distension.     Palpations: Abdomen is soft.     Tenderness: There is no abdominal tenderness. There is no rebound.  Musculoskeletal:     Cervical back: Normal range of motion.  Skin:    General: Skin is warm and dry.  Neurological:     Mental Status: She is alert and oriented to person, place, and time.     Coordination: Coordination normal.     Vitals:   08/28/22 1037  BP: 120/60  Pulse: 65  Temp: 98.5 F (36.9 C)  TempSrc: Oral  SpO2: 99%  Weight: 294 lb (133.4 kg)  Height:  '5\' 5"'$  (1.651 m)    Assessment & Plan:  Visit time 20 minutes in face to face communication with patient and coordination of care, additional 10 minutes spent in record review, coordination or care, ordering tests, communicating/referring to other healthcare professionals, documenting in medical records all on the same day of the visit for total time 30 minutes spent on the visit.

## 2022-08-28 NOTE — Telephone Encounter (Signed)
Patient called clinic and was rescheduled for 09/02/22

## 2022-08-29 ENCOUNTER — Encounter: Payer: Self-pay | Admitting: Internal Medicine

## 2022-08-29 DIAGNOSIS — G4734 Idiopathic sleep related nonobstructive alveolar hypoventilation: Secondary | ICD-10-CM | POA: Insufficient documentation

## 2022-08-29 NOTE — Assessment & Plan Note (Signed)
She understands that this makes her more susceptible to infection and usually wears mask at work and out.

## 2022-08-29 NOTE — Assessment & Plan Note (Signed)
Will need ongoing monitoring given her chronic prednisone use.

## 2022-08-29 NOTE — Assessment & Plan Note (Signed)
Using oxygen 2L at night time and likely this will be chronic. Can continue.

## 2022-08-29 NOTE — Assessment & Plan Note (Signed)
Weight overall stable, given that she had to take steroids recently this can cause weight gain. She is back to her maintenance 10 mg prednisone daily dosing now.

## 2022-08-30 ENCOUNTER — Other Ambulatory Visit (HOSPITAL_COMMUNITY): Payer: Self-pay

## 2022-09-02 ENCOUNTER — Ambulatory Visit: Payer: 59 | Admitting: Physical Medicine and Rehabilitation

## 2022-09-02 ENCOUNTER — Ambulatory Visit: Payer: Self-pay

## 2022-09-02 VITALS — BP 138/82 | HR 54

## 2022-09-02 DIAGNOSIS — M5416 Radiculopathy, lumbar region: Secondary | ICD-10-CM | POA: Diagnosis not present

## 2022-09-02 DIAGNOSIS — M5116 Intervertebral disc disorders with radiculopathy, lumbar region: Secondary | ICD-10-CM

## 2022-09-02 MED ORDER — METHYLPREDNISOLONE ACETATE 80 MG/ML IJ SUSP
80.0000 mg | Freq: Once | INTRAMUSCULAR | Status: AC
  Administered 2022-09-02: 80 mg

## 2022-09-02 NOTE — Patient Instructions (Signed)

## 2022-09-02 NOTE — Progress Notes (Unsigned)
Numeric Pain Rating Scale and Functional Assessment Average Pain 3   In the last MONTH (on 0-10 scale) has pain interfered with the following?  1. General activity like being  able to carry out your everyday physical activities such as walking, climbing stairs, carrying groceries, or moving a chair?  Rating( varies )   +Driver, -BT- Xarelto- stopped medication, -Dye Allergies.  Lower back pain that radiates down left leg, burning and tinging. Walking makes pain worse

## 2022-09-03 NOTE — Progress Notes (Signed)
Monique Clark - 56 y.o. female MRN [id number]  Date of birth: 04-18-1966  Office Visit Note: Visit Date: 09/02/2022 PCP: Hoyt Koch, MD Referred by: Hoyt Koch, *  Subjective: Chief Complaint  Patient presents with   Lower Back - Pain   HPI:  Monique Clark is a 56 y.o. female who comes in today at the request of Dr. Rodell Perna for planned Left L5-S1 Lumbar Interlaminar epidural steroid injection with fluoroscopic guidance.  The patient has failed conservative care including home exercise, medications, time and activity modification.  This injection will be diagnostic and hopefully therapeutic.  Please see requesting physician notes for further details and justification.  Patient has had injection in the past with good relief.  New MRI was reviewed and is reviewed below.  Patient still with disc herniation and likely radicular pain from the L5 nerve root.  She is on Xarelto and has been off that now for 3 days.  She will start that back tomorrow.   ROS Otherwise per HPI.  Assessment & Plan: Visit Diagnoses:    ICD-10-CM   1. Lumbar radiculopathy  M54.16 XR C-ARM NO REPORT    Epidural Steroid injection    methylPREDNISolone acetate (DEPO-MEDROL) injection 80 mg    2. Radiculopathy due to lumbar intervertebral disc disorder  M51.16       Plan: No additional findings.   Meds & Orders:  Meds ordered this encounter  Medications   methylPREDNISolone acetate (DEPO-MEDROL) injection 80 mg    Orders Placed This Encounter  Procedures   XR C-ARM NO REPORT   Epidural Steroid injection    Follow-up: Return for visit to requesting provider as needed.   Procedures: No procedures performed  Lumbar Epidural Steroid Injection - Interlaminar Approach with Fluoroscopic Guidance  Patient: Monique Clark      Date of Birth: 1966/03/31 MRN: [id number] PCP: Hoyt Koch, MD      Visit Date: 09/02/2022   Universal Protocol:     Consent Given By: the  patient  Position: PRONE  Additional Comments: Vital signs were monitored before and after the procedure. Patient was prepped and draped in the usual sterile fashion. The correct patient, procedure, and site was verified.   Injection Procedure Details:   Procedure diagnoses: Lumbar radiculopathy [M54.16]   Meds Administered:  Meds ordered this encounter  Medications   methylPREDNISolone acetate (DEPO-MEDROL) injection 80 mg     Laterality: Left  Location/Site:  L5-S1  Needle: 4.5 in., 20 ga. Tuohy  Needle Placement: Paramedian epidural  Findings:   -Comments: Excellent flow of contrast into the epidural space.  Procedure Details: Using a paramedian approach from the side mentioned above, the region overlying the inferior lamina was localized under fluoroscopic visualization and the soft tissues overlying this structure were infiltrated with 4 ml. of 1% Lidocaine without Epinephrine. The Tuohy needle was inserted into the epidural space using a paramedian approach.   The epidural space was localized using loss of resistance along with counter oblique bi-planar fluoroscopic views.  After negative aspirate for air, blood, and CSF, a 2 ml. volume of Isovue-250 was injected into the epidural space and the flow of contrast was observed. Radiographs were obtained for documentation purposes.    The injectate was administered into the level noted above.   Additional Comments:  The patient tolerated the procedure well Dressing: 2 x 2 sterile gauze and Band-Aid    Post-procedure details: Patient was observed during the procedure. Post-procedure instructions were reviewed.  Patient left  the clinic in stable condition.   Clinical History: MRI LUMBAR SPINE WITHOUT AND WITH CONTRAST   TECHNIQUE: Multiplanar and multiecho pulse sequences of the lumbar spine were obtained without and with intravenous contrast.   CONTRAST:  10 cc of UA intravenous   COMPARISON:  06/14/2021    FINDINGS: Segmentation:  Standard.   Alignment:  Physiologic.   Vertebrae: No fracture, evidence of discitis, or aggressive bone lesion.   Conus medullaris and cauda equina: Conus extends to the L1-2 level. Conus and cauda equina appear normal.   Paraspinal and other soft tissues: Atrophy of intrinsic back muscles, especially at the thoracolumbar junction and at the level of prior surgery where there is posterior scarring.   Disc levels:   L1-L2: Mild spondylosis   L2-L3: Disc narrowing and bulging with endplate and facet spurring. Small right foraminal protrusion. No neural impingement   L3-L4: Disc narrowing and bulging with endplate and facet spurring. Mild spinal and bilateral foraminal stenosis. Asymmetric left subarticular recess narrowing that is also mild   L4-L5: Disc narrowing and bulging with endplate ridging. New left paracentral herniation compressing the left L5 nerve root. Improved right-sided canal patency after right laminotomy, although there is isointense material at the right subarticular recess that is not clearly enhancing, some residual disc and associated stenosis. Overall moderate spinal stenosis. Facet spurring and disc bulging causes moderate foraminal narrowing on the right.   L5-S1:Disc narrowing and bulging with degenerative facet spurring. Bilateral subarticular recess stenosis that could affect the S1 nerve roots, stable.   IMPRESSION: 1. L4-5 left paracentral herniation impinging on the left L5 nerve root, progressed from 2022. 2. Interval L4-5 right canal decompression with improved patency, although some residual disc and stenosis at the right subarticular recess. Moderate right foraminal narrowing at this level. 3. Stable noncompressive degeneration at the adjacent levels.     Electronically Signed   By: Jorje Guild M.D.   On: 07/14/2022 19:13     Objective:  VS:  HT:    WT:   BMI:     BP:138/82  HR:(!) 54bpm  TEMP: (  )  RESP:  Physical Exam Vitals and nursing note reviewed.  Constitutional:      General: She is not in acute distress.    Appearance: Normal appearance. She is obese. She is not ill-appearing.  HENT:     Head: Normocephalic and atraumatic.     Right Ear: External ear normal.     Left Ear: External ear normal.  Eyes:     Extraocular Movements: Extraocular movements intact.  Cardiovascular:     Rate and Rhythm: Normal rate.     Pulses: Normal pulses.  Pulmonary:     Effort: Pulmonary effort is normal. No respiratory distress.  Abdominal:     General: There is no distension.     Palpations: Abdomen is soft.  Musculoskeletal:        General: Tenderness present.     Cervical back: Neck supple.     Right lower leg: No edema.     Left lower leg: No edema.     Comments: Patient has good distal strength with no pain over the greater trochanters.  No clonus or focal weakness.  Skin:    Findings: No erythema, lesion or rash.  Neurological:     General: No focal deficit present.     Mental Status: She is alert and oriented to person, place, and time.     Sensory: No sensory deficit.  Motor: No weakness or abnormal muscle tone.     Coordination: Coordination normal.  Psychiatric:        Mood and Affect: Mood normal.        Behavior: Behavior normal.      Imaging: XR C-ARM NO REPORT  Result Date: 09/02/2022 Please see Notes tab for imaging impression.

## 2022-09-03 NOTE — Procedures (Signed)
Lumbar Epidural Steroid Injection - Interlaminar Approach with Fluoroscopic Guidance  Patient: Monique Clark      Date of Birth: [birth year] MRN: [id number] PCP: Hoyt Koch, MD      Visit Date: 09/02/2022   Universal Protocol:     Consent Given By: the patient  Position: PRONE  Additional Comments: Vital signs were monitored before and after the procedure. Patient was prepped and draped in the usual sterile fashion. The correct patient, procedure, and site was verified.   Injection Procedure Details:   Procedure diagnoses: Lumbar radiculopathy [M54.16]   Meds Administered:  Meds ordered this encounter  Medications   methylPREDNISolone acetate (DEPO-MEDROL) injection 80 mg     Laterality: Left  Location/Site:  L5-S1  Needle: 4.5 in., 20 ga. Tuohy  Needle Placement: Paramedian epidural  Findings:   -Comments: Excellent flow of contrast into the epidural space.  Procedure Details: Using a paramedian approach from the side mentioned above, the region overlying the inferior lamina was localized under fluoroscopic visualization and the soft tissues overlying this structure were infiltrated with 4 ml. of 1% Lidocaine without Epinephrine. The Tuohy needle was inserted into the epidural space using a paramedian approach.   The epidural space was localized using loss of resistance along with counter oblique bi-planar fluoroscopic views.  After negative aspirate for air, blood, and CSF, a 2 ml. volume of Isovue-250 was injected into the epidural space and the flow of contrast was observed. Radiographs were obtained for documentation purposes.    The injectate was administered into the level noted above.   Additional Comments:  The patient tolerated the procedure well Dressing: 2 x 2 sterile gauze and Band-Aid    Post-procedure details: Patient was observed during the procedure. Post-procedure instructions were reviewed.  Patient left the clinic in stable  condition.

## 2022-09-11 DIAGNOSIS — Z20822 Contact with and (suspected) exposure to covid-19: Secondary | ICD-10-CM | POA: Diagnosis not present

## 2022-09-11 DIAGNOSIS — H66003 Acute suppurative otitis media without spontaneous rupture of ear drum, bilateral: Secondary | ICD-10-CM | POA: Diagnosis not present

## 2022-09-11 DIAGNOSIS — J019 Acute sinusitis, unspecified: Secondary | ICD-10-CM | POA: Diagnosis not present

## 2022-09-18 ENCOUNTER — Other Ambulatory Visit: Payer: Self-pay

## 2022-09-18 ENCOUNTER — Other Ambulatory Visit: Payer: Self-pay | Admitting: Orthopaedic Surgery

## 2022-09-18 ENCOUNTER — Other Ambulatory Visit (HOSPITAL_COMMUNITY): Payer: Self-pay

## 2022-09-18 ENCOUNTER — Ambulatory Visit: Payer: 59 | Attending: Internal Medicine | Admitting: Internal Medicine

## 2022-09-18 ENCOUNTER — Encounter: Payer: Self-pay | Admitting: Internal Medicine

## 2022-09-18 VITALS — BP 106/75 | HR 59 | Resp 16 | Ht 65.0 in | Wt 293.2 lb

## 2022-09-18 DIAGNOSIS — M7542 Impingement syndrome of left shoulder: Secondary | ICD-10-CM

## 2022-09-18 DIAGNOSIS — D839 Common variable immunodeficiency, unspecified: Secondary | ICD-10-CM

## 2022-09-18 DIAGNOSIS — Z79899 Other long term (current) drug therapy: Secondary | ICD-10-CM | POA: Diagnosis not present

## 2022-09-18 DIAGNOSIS — M353 Polymyalgia rheumatica: Secondary | ICD-10-CM | POA: Diagnosis not present

## 2022-09-18 NOTE — Progress Notes (Signed)
Office Visit Note  Patient: Monique Clark             Date of Birth: December 10, 1965           MRN: [id number]             PCP: Hoyt Koch, MD Referring: Hoyt Koch, * Visit Date: 09/18/2022   Subjective:  Follow-up  History of Present Illness: Monique Clark is a 56 y.o. female here for follow up for PMR on prednisone 10 mg daily. Since the past visit she had a hospital admission for hypoxia suspected as viral URI trigger of asthma exacerbation. She stopped methotrexate at that time and has been holding off resuming the medication concern about contributing to infection.  Currently is partly through a course of Augmentin for the persistent upper respiratory infection.  She had lumbar spine injection for ongoing back pain without a great relief of symptoms this time.  Previous HPI 05/20/22 Monique Clark is a 56 y.o. female here for follow up for PMR on methotrexate 15 mg p.o. weekly.  Since her last visit she underwent gastric sleeve resection surgery for weight reduction.  This has still been in the recovery process.  She discontinued the prednisone perioperatively and has been off this.  She notices multiple symptoms are worse with joint pain and swelling especially in shoulders and elbows.  Not as much lower extremity swelling.   Previous HPI 01/06/2022 Monique Clark is a 56 y.o. female here for follow up for PMR on prednisone 10 mg daily and started methotrexate 10 mg PO weekly last month due to persistent inflammatory lab elevations.  She continues having fatigue is persistent.  Shortness of breath with walking and exertion is slightly improved.  She has not noticed any side effects or intolerance to methotrexate.  She is continuing to try to work on her exercise as a part of preparation for bariatric surgery also awaiting upcoming cardiac evaluation.     Previous HPI 12/02/21 Monique Clark is a 56 y.o. female here for muscle pain and weakness in shoulders  and legs concern for history of PMR previously treated with long term prednisone.  She has a history of CVID on IVIG, non hodgkins lymphoma, also has history of bilateral hip replacement. Recently had microdiscectomy about 6 weeks prior. Started prednisone 40 mg daily last month decreased to 20 mg daily and now is down to 10 mg dose. She felt her symptoms improve about 80-90% when starting prednisone again and tapering has been okay, only slight increase with this dose reduction. She does not see any particular swelling, discoloration, or rashes. She has noticed some hand tremor more when using her hands that is not typical for her.   Review of Systems  Constitutional:  Positive for fatigue.  HENT:  Positive for mouth dryness. Negative for mouth sores.   Eyes:  Positive for dryness.  Respiratory: Negative.  Negative for shortness of breath.   Cardiovascular:  Positive for palpitations. Negative for chest pain.  Gastrointestinal: Negative.  Negative for blood in stool, constipation and diarrhea.  Endocrine: Negative.  Negative for increased urination.  Genitourinary: Negative.  Negative for involuntary urination.  Musculoskeletal:  Positive for joint pain, gait problem, joint pain, morning stiffness and muscle tenderness. Negative for joint swelling, myalgias, muscle weakness and myalgias.  Skin:  Positive for hair loss. Negative for color change, rash and sensitivity to sunlight.  Allergic/Immunologic: Positive for susceptible to infections.  Neurological:  Positive for headaches. Negative for dizziness.  Hematological:  Positive for swollen glands.  Psychiatric/Behavioral:  Positive for sleep disturbance. Negative for depressed mood. The patient is not nervous/anxious.     PMFS History:  Patient Active Problem List   Diagnosis Date Noted   Nocturnal hypoxemia 08/29/2022   Pulmonary nodule 08/16/2022   Protrusion of lumbar intervertebral disc 07/25/2022   OSA on CPAP 05/19/2022    Impingement syndrome of left shoulder 05/13/2022   Morbid (severe) obesity due to excess calories ([place]) 04/29/2022   Leukocytosis 01/20/2022   Morbid obesity ([place]) 01/01/2022   Snoring 01/01/2022   Non-restorative sleep 01/01/2022   Chronic fatigue and immune dysfunction syndrome ([place]) 01/01/2022   High risk medication use 12/02/2021   Otitis externa 11/05/2021   PMR (polymyalgia rheumatica) ([place]) 11/05/2021   Insomnia 05/28/2021   Malignant (primary) neoplasm, unspecified ([place]) 03/08/2021   Personal history of urinary (tract) infections 03/08/2021   Personal history of pneumonia (recurrent) 03/08/2021   Elevated serum hCG 07/02/2020   POTS (postural orthostatic tachycardia syndrome) 03/08/2020   Intermittent claudication ([place]) 01/04/2020   Mixed hyperlipidemia 03/21/2019   Palpitations 03/21/2019   Paroxysmal atrial fibrillation ([facility])    Localized swelling of left lower extremity    Lightheadedness    Essential hypertension    Cellulitis 06/22/2017   Mitral valve regurgitation 06/22/2017   CVID (common variable immunodeficiency) ([place]) 05/07/2015   Prediabetes 01/20/2013   History of non-Hodgkin's lymphoma 12/21/2012   Low back pain radiating to right lower extremity 12/10/2012   Mononucleosis 12/08/1983    Past Medical History:  Diagnosis Date   Abscess    Arthritis    Atrial fibrillation ([place])    Cardiac arrhythmia due to congenital heart disease    trace MR 08/15/20 echo ([place])   CVID (common variable immunodeficiency) ([place]) 2015   Diverticulosis    Elevated cholesterol    GERD (gastroesophageal reflux disease)    High blood pressure    Non Hodgkin's lymphoma ([place]) 1998   Obesity    Personal history of gestational diabetes    PMR (polymyalgia rheumatica) ([facility])    Pneumonia    PONV (postoperative nausea and vomiting)    POTS (postural orthostatic tachycardia syndrome)    Pre-diabetes    UTI (urinary tract infection)     Family History  Problem  Relation Age of Onset   Atrial fibrillation Mother    Cancer Father        Colon   Colon cancer Sister    Basal cell carcinoma Brother    Atrial fibrillation Maternal Uncle    Atrial fibrillation Maternal Grandmother    Atrial fibrillation Maternal Grandfather    Heart attack Maternal Grandfather    Ovarian cancer Neg Hx    Past Surgical History:  Procedure Laterality Date   ABDOMINAL HYSTERECTOMY     APPENDECTOMY     CESAREAN SECTION     GALLBLADDER SURGERY     JOINT REPLACEMENT Left    hip   JOINT REPLACEMENT Right    Right Hip 2022   LAPAROSCOPIC GASTRIC SLEEVE RESECTION  04/29/2022   left shoulder repair     LUMBAR LAMINECTOMY N/A 09/18/2021   Procedure: RIGHT LUMBAR FOUR-FIVE MICRODISCECTOMY;  Surgeon: Marybelle Killings, MD;  Location: [facility];  Service: Orthopedics;  Laterality: N/A;   LUNG REMOVAL, PARTIAL Right 2009   VATS, wedge resection partial lobectomy, done at [facility]   myringostomy Bilateral    SPLENECTOMY, TOTAL  1998   TONSILLECTOMY AND ADENOIDECTOMY     UPPER GI ENDOSCOPY N/A  04/29/2022   Procedure: UPPER GI ENDOSCOPY;  Surgeon: Kieth Brightly Arta Bruce, MD;  Location: [facility];  Service: General;  Laterality: N/A;   Social History   Social History Narrative   Not on file   Immunization History  Administered Date(s) Administered   Influenza-Unspecified 07/27/2020, 07/08/2022   PFIZER(Purple Top)SARS-COV-2 Vaccination 09/24/2019, 10/12/2019     Objective: Vital Signs: BP 106/75 (BP Location: Left Arm, Patient Position: Sitting, Cuff Size: Large)   Pulse (!) 59   Resp 16   Ht '5\' 5"'$  (1.651 m)   Wt 293 lb 3.2 oz (133 kg)   BMI 48.79 kg/m    Physical Exam Constitutional:      Appearance: She is obese.  Eyes:     Conjunctiva/sclera: Conjunctivae normal.  Cardiovascular:     Rate and Rhythm: Normal rate and regular rhythm.  Pulmonary:     Effort: Pulmonary effort is normal.     Breath sounds: Normal breath sounds.  Lymphadenopathy:      Cervical: No cervical adenopathy.  Skin:    General: Skin is warm and dry.  Neurological:     Mental Status: She is alert.  Psychiatric:        Mood and Affect: Mood normal.      Musculoskeletal Exam:  Shoulders full range of motion but sore with overhead abduction, tenderness to pressure anteriorly with no palpable swelling bilaterally Elbows full ROM no tenderness or swelling Wrists full ROM no tenderness or swelling Fingers full ROM no tenderness or swelling Knees full ROM no tenderness or swelling   Investigation: No additional findings.  Imaging: XR C-ARM NO REPORT  Result Date: 09/02/2022 Please see Notes tab for imaging impression.  Epidural Steroid injection  Result Date: 09/02/2022 Magnus Sinning, MD     09/03/2022  7:51 AM Lumbar Epidural Steroid Injection - Interlaminar Approach with Fluoroscopic Guidance Patient: Kytzia Gienger     Date of Birth: [birth year] MRN: [id number] PCP: Hoyt Koch, MD     Visit Date: 09/02/2022  Universal Protocol:   Consent Given By: the patient Position: PRONE Additional Comments: Vital signs were monitored before and after the procedure. Patient was prepped and draped in the usual sterile fashion. The correct patient, procedure, and site was verified. Injection Procedure Details: Procedure diagnoses: Lumbar radiculopathy [M54.16] Meds Administered: Meds ordered this encounter Medications  methylPREDNISolone acetate (DEPO-MEDROL) injection 80 mg  Laterality: Left Location/Site:  L5-S1 Needle: 4.5 in., 20 ga. Tuohy Needle Placement: Paramedian epidural Findings:  -Comments: Excellent flow of contrast into the epidural space. Procedure Details: Using a paramedian approach from the side mentioned above, the region overlying the inferior lamina was localized under fluoroscopic visualization and the soft tissues overlying this structure were infiltrated with 4 ml. of 1% Lidocaine without Epinephrine. The Tuohy needle was inserted into the  epidural space using a paramedian approach. The epidural space was localized using loss of resistance along with counter oblique bi-planar fluoroscopic views.  After negative aspirate for air, blood, and CSF, a 2 ml. volume of Isovue-250 was injected into the epidural space and the flow of contrast was observed. Radiographs were obtained for documentation purposes.  The injectate was administered into the level noted above. Additional Comments: The patient tolerated the procedure well Dressing: 2 x 2 sterile gauze and Band-Aid  Post-procedure details: Patient was observed during the procedure. Post-procedure instructions were reviewed. Patient left the clinic in stable condition.   Recent Labs: Lab Results  Component Value Date   WBC 19.9 (H) 08/19/2022  HGB 11.2 (L) 08/19/2022   PLT 395 08/19/2022   NA 141 08/19/2022   K 3.8 08/19/2022   CL 103 08/19/2022   CO2 28 08/19/2022   GLUCOSE 118 (H) 08/19/2022   BUN 17 08/19/2022   CREATININE 1.02 (H) 08/19/2022   BILITOT 0.2 (L) 08/16/2022   ALKPHOS 70 08/16/2022   AST 31 08/16/2022   ALT 22 08/16/2022   PROT 6.8 08/16/2022   ALBUMIN 3.6 08/16/2022   CALCIUM 9.4 08/19/2022   GFRAA >60 03/18/2020    Speciality Comments: No specialty comments available.  Procedures:  No procedures performed Allergies: Chlorhexidine, Tetanus-diphtheria toxoids td, Nizatidine, Promethazine, and Tetanus toxoids   Assessment / Plan:     Visit Diagnoses: PMR (polymyalgia rheumatica) ([place]) - Plan: Sedimentation rate, C-reactive protein  Symptoms are doing well remains on the prednisone at the 10 mg dose per day.  I recommend we recheck her inflammatory markers but as she is finishing an antibiotic course for infection we will plan for recheck lab visit in 2 weeks.  Discussed it may be some additional medication option for refractory PMR with methotrexate intolerance coming up in the future we will maintain with just low-dose glucocorticoids for now.  High  risk medication use CVID (common variable immunodeficiency) ([place])  Not entirely clear whether methotrexate was contributing to her upper respiratory illness and asthma exacerbation but has not had an episode like that for years prior to treatment.  Will continue on just the low-dose prednisone for now concern is for excessive immunosuppression with existing CVID.  Impingement syndrome of left shoulder  Still has some shoulder pain especially with certain range of movement but not in exacerbation currently.  Orders: Orders Placed This Encounter  Procedures   Sedimentation rate   C-reactive protein   No orders of the defined types were placed in this encounter.    Follow-Up Instructions: Return in about 4 months (around 01/18/2023) for PMR on GC f/u 21mo.   CCollier Salina MD  Note - This record has been created using [employer]  Chart creation errors have been sought, but may not always  have been located. Such creation errors do not reflect on  the standard of medical care.

## 2022-09-19 ENCOUNTER — Other Ambulatory Visit (HOSPITAL_COMMUNITY): Payer: Self-pay

## 2022-09-19 MED ORDER — TRAMADOL HCL 50 MG PO TABS
50.0000 mg | ORAL_TABLET | Freq: Two times a day (BID) | ORAL | 0 refills | Status: DC | PRN
  Filled 2022-09-19: qty 30, 15d supply, fill #0

## 2022-10-02 ENCOUNTER — Other Ambulatory Visit: Payer: Self-pay | Admitting: Internal Medicine

## 2022-10-02 ENCOUNTER — Other Ambulatory Visit: Payer: Self-pay | Admitting: Orthopaedic Surgery

## 2022-10-02 ENCOUNTER — Other Ambulatory Visit: Payer: Self-pay | Admitting: Obstetrics and Gynecology

## 2022-10-02 DIAGNOSIS — M353 Polymyalgia rheumatica: Secondary | ICD-10-CM

## 2022-10-03 ENCOUNTER — Other Ambulatory Visit: Payer: Self-pay

## 2022-10-03 ENCOUNTER — Other Ambulatory Visit (HOSPITAL_COMMUNITY): Payer: Self-pay

## 2022-10-03 MED ORDER — GABAPENTIN 100 MG PO CAPS
ORAL_CAPSULE | ORAL | 0 refills | Status: DC
  Filled 2022-10-03: qty 90, 30d supply, fill #0

## 2022-10-03 MED ORDER — ZOLPIDEM TARTRATE 5 MG PO TABS
5.0000 mg | ORAL_TABLET | Freq: Every evening | ORAL | 5 refills | Status: DC | PRN
  Filled 2022-10-03: qty 30, 30d supply, fill #0
  Filled 2022-10-30 – 2022-11-07 (×2): qty 30, 30d supply, fill #1
  Filled 2022-12-04: qty 30, 30d supply, fill #2
  Filled 2023-01-09: qty 30, 30d supply, fill #3
  Filled 2023-02-08: qty 30, 30d supply, fill #4
  Filled 2023-03-13 – 2023-03-25 (×4): qty 30, 30d supply, fill #5

## 2022-10-04 ENCOUNTER — Other Ambulatory Visit (HOSPITAL_COMMUNITY): Payer: Self-pay

## 2022-10-05 DIAGNOSIS — D801 Nonfamilial hypogammaglobulinemia: Secondary | ICD-10-CM | POA: Diagnosis not present

## 2022-10-06 ENCOUNTER — Other Ambulatory Visit: Payer: Self-pay

## 2022-10-07 ENCOUNTER — Encounter: Payer: Self-pay | Admitting: Orthopaedic Surgery

## 2022-10-07 ENCOUNTER — Ambulatory Visit (INDEPENDENT_AMBULATORY_CARE_PROVIDER_SITE_OTHER): Payer: Commercial Managed Care - PPO | Admitting: Orthopaedic Surgery

## 2022-10-07 ENCOUNTER — Ambulatory Visit: Payer: 59

## 2022-10-07 VITALS — BP 146/86 | HR 64 | Ht 66.0 in | Wt 284.0 lb

## 2022-10-07 DIAGNOSIS — M5126 Other intervertebral disc displacement, lumbar region: Secondary | ICD-10-CM

## 2022-10-07 NOTE — Progress Notes (Addendum)
Office Visit Note   Patient: Monique Clark           Date of Birth: Sep 06, 1966           MRN: [id number] Visit Date: 10/07/2022              Requested by: Hoyt Koch, MD [id number] [place],  [place] [address] PCP: Hoyt Koch, MD   Assessment & Plan: Visit Diagnoses:  1. Protrusion of lumbar intervertebral disc     Plan: We discussed water therapy.  She still working on weight loss.  We discussed options microdiscectomy, possible second opinion visit with Dr. Laurance Flatten.  We discussed problems with lumbar fusion with adjacent level increase loading and problems that can cause progression at other levels.  I will check her back again in 3 months.  She is already on Cymbalta is continuing the Neurontin she takes 300 mg at night which helps her sleep.  She is here with her 2 daughters today .  Follow-Up Instructions: Return in about 6 months (around 04/07/2023).   Orders:  No orders of the defined types were placed in this encounter.  No orders of the defined types were placed in this encounter.     Procedures: No procedures performed   Clinical Data: No additional findings.   Subjective: Chief Complaint  Patient presents with   Lower Back - Pain, Follow-up    HPI 57 year old female returns with recent epidural injection L4-5 on the left which unfortunately did not give her any relief.  Previous surgery right L4-5 for disc protrusion on 09/18/2021.  She did well for many months and then started having back and left leg pain L5 distribution.  More pain in her calf dorsum of the foot.  She has used ibuprofen and gabapentin.  She is used tramadol at night sometimes it seems to help.  Recently in the hospital for acute respiratory failure with hypoxia, CVID in November 2023.,.  Past history of non-Hodgkin's lymphoma morbid obesity bypass surgery with weight loss BMI currently 45.  Patient continues to get good relief from microdiscectomy L4-5 on the right.   MRI 07/14/2022 showed left paracentral herniation impinging the left L5 nerve root which had progressed from 2022.  She did have some bulging and narrowing mild at L3-4 and also some subarticular recess stenosis at L5-S1.  No associated bowel or bladder symptoms.  Review of Systems all systems updated noncontributory.   Objective: Vital Signs: BP (!) 146/86   Pulse 64   Ht '5\' 6"'$  (1.676 m)   Wt 284 lb (128.8 kg)   BMI 45.84 kg/m   Physical Exam Constitutional:      Appearance: She is well-developed.  HENT:     Head: Normocephalic.     Right Ear: External ear normal.     Left Ear: External ear normal. There is no impacted cerumen.  Eyes:     Pupils: Pupils are equal, round, and reactive to light.  Neck:     Thyroid: No thyromegaly.     Trachea: No tracheal deviation.  Cardiovascular:     Rate and Rhythm: Normal rate.  Pulmonary:     Effort: Pulmonary effort is normal.  Abdominal:     Palpations: Abdomen is soft.  Musculoskeletal:     Cervical back: No rigidity.  Skin:    General: Skin is warm and dry.  Neurological:     Mental Status: She is alert and oriented to person, place, and time.  Psychiatric:  Behavior: Behavior normal.     Ortho Exam no focal weakness.  No tenderness on the trochanters.  She abulates without limping.  Negative logroll hips.  Specialty Comments:  MRI LUMBAR SPINE WITHOUT AND WITH CONTRAST   TECHNIQUE: Multiplanar and multiecho pulse sequences of the lumbar spine were obtained without and with intravenous contrast.   CONTRAST:  10 cc of UA intravenous   COMPARISON:  06/14/2021   FINDINGS: Segmentation:  Standard.   Alignment:  Physiologic.   Vertebrae: No fracture, evidence of discitis, or aggressive bone lesion.   Conus medullaris and cauda equina: Conus extends to the L1-2 level. Conus and cauda equina appear normal.   Paraspinal and other soft tissues: Atrophy of intrinsic back muscles, especially at the thoracolumbar  junction and at the level of prior surgery where there is posterior scarring.   Disc levels:   L1-L2: Mild spondylosis   L2-L3: Disc narrowing and bulging with endplate and facet spurring. Small right foraminal protrusion. No neural impingement   L3-L4: Disc narrowing and bulging with endplate and facet spurring. Mild spinal and bilateral foraminal stenosis. Asymmetric left subarticular recess narrowing that is also mild   L4-L5: Disc narrowing and bulging with endplate ridging. New left paracentral herniation compressing the left L5 nerve root. Improved right-sided canal patency after right laminotomy, although there is isointense material at the right subarticular recess that is not clearly enhancing, some residual disc and associated stenosis. Overall moderate spinal stenosis. Facet spurring and disc bulging causes moderate foraminal narrowing on the right.   L5-S1:Disc narrowing and bulging with degenerative facet spurring. Bilateral subarticular recess stenosis that could affect the S1 nerve roots, stable.   IMPRESSION: 1. L4-5 left paracentral herniation impinging on the left L5 nerve root, progressed from 2022. 2. Interval L4-5 right canal decompression with improved patency, although some residual disc and stenosis at the right subarticular recess. Moderate right foraminal narrowing at this level. 3. Stable noncompressive degeneration at the adjacent levels.     Electronically Signed   By: Jorje Guild M.D.   On: 07/14/2022 19:13  Imaging: No results found.   PMFS History: Patient Active Problem List   Diagnosis Date Noted   Nocturnal hypoxemia 08/29/2022   Pulmonary nodule 08/16/2022   Protrusion of lumbar intervertebral disc 07/25/2022   OSA on CPAP 05/19/2022   Impingement syndrome of left shoulder 05/13/2022   Morbid (severe) obesity due to excess calories ([place]) 04/29/2022   Leukocytosis 01/20/2022   Morbid obesity ([place]) 01/01/2022   Snoring  01/01/2022   Non-restorative sleep 01/01/2022   Chronic fatigue and immune dysfunction syndrome ([place]) 01/01/2022   High risk medication use 12/02/2021   Otitis externa 11/05/2021   PMR (polymyalgia rheumatica) ([place]) 11/05/2021   Insomnia 05/28/2021   Malignant (primary) neoplasm, unspecified ([place]) 03/08/2021   Personal history of urinary (tract) infections 03/08/2021   Personal history of pneumonia (recurrent) 03/08/2021   Elevated serum hCG 07/02/2020   POTS (postural orthostatic tachycardia syndrome) 03/08/2020   Intermittent claudication ([place]) 01/04/2020   Mixed hyperlipidemia 03/21/2019   Palpitations 03/21/2019   Paroxysmal atrial fibrillation (HCC)    Localized swelling of left lower extremity    Lightheadedness    Essential hypertension    Cellulitis 06/22/2017   Mitral valve regurgitation 06/22/2017   CVID (common variable immunodeficiency) ([place]) 05/07/2015   Prediabetes 01/20/2013   History of non-Hodgkin's lymphoma 12/21/2012   Low back pain radiating to right lower extremity 12/10/2012   Mononucleosis 12/08/1983   Past Medical History:  Diagnosis Date  Abscess    Arthritis    Atrial fibrillation (HCC)    Cardiac arrhythmia due to congenital heart disease    trace MR 08/15/20 echo ([place])   CVID (common variable immunodeficiency) ([place]) 2015   Diverticulosis    Elevated cholesterol    GERD (gastroesophageal reflux disease)    High blood pressure    Non Hodgkin's lymphoma ([place]) 1998   Obesity    Personal history of gestational diabetes    PMR (polymyalgia rheumatica) (HCC)    Pneumonia    PONV (postoperative nausea and vomiting)    POTS (postural orthostatic tachycardia syndrome)    Pre-diabetes    UTI (urinary tract infection)     Family History  Problem Relation Age of Onset   Atrial fibrillation Mother    Cancer Father        Colon   Colon cancer Sister    Basal cell carcinoma Brother    Atrial fibrillation Maternal Uncle    Atrial  fibrillation Maternal Grandmother    Atrial fibrillation Maternal Grandfather    Heart attack Maternal Grandfather    Ovarian cancer Neg Hx     Past Surgical History:  Procedure Laterality Date   ABDOMINAL HYSTERECTOMY     APPENDECTOMY     CESAREAN SECTION     GALLBLADDER SURGERY     JOINT REPLACEMENT Left    hip   JOINT REPLACEMENT Right    Right Hip 2022   LAPAROSCOPIC GASTRIC SLEEVE RESECTION  04/29/2022   left shoulder repair     LUMBAR LAMINECTOMY N/A 09/18/2021   Procedure: RIGHT LUMBAR FOUR-FIVE MICRODISCECTOMY;  Surgeon: Marybelle Killings, MD;  Location: [facility];  Service: Orthopedics;  Laterality: N/A;   LUNG REMOVAL, PARTIAL Right 2009   VATS, wedge resection partial lobectomy, done at [facility] Bilateral    SPLENECTOMY, TOTAL  1998   TONSILLECTOMY AND ADENOIDECTOMY     UPPER GI ENDOSCOPY N/A 04/29/2022   Procedure: UPPER GI ENDOSCOPY;  Surgeon: Kinsinger, Arta Bruce, MD;  Location: [facility];  Service: General;  Laterality: N/A;   Social History   Occupational History   Occupation: rn  Tobacco Use   Smoking status: Former    Packs/day: 0.50    Years: 20.00    Total pack years: 10.00    Types: Cigarettes    Quit date: 09/29/2002    Years since quitting: 20.0   Smokeless tobacco: Never  Vaping Use   Vaping Use: Never used  Substance and Sexual Activity   Alcohol use: No   Drug use: No   Sexual activity: Yes    Birth control/protection: Surgical, Post-menopausal    Comment: 1st intercourse 10 yo-5 partners

## 2022-10-09 ENCOUNTER — Encounter: Payer: Self-pay | Admitting: Internal Medicine

## 2022-10-09 ENCOUNTER — Other Ambulatory Visit: Payer: Self-pay | Admitting: *Deleted

## 2022-10-09 ENCOUNTER — Ambulatory Visit (INDEPENDENT_AMBULATORY_CARE_PROVIDER_SITE_OTHER): Payer: Commercial Managed Care - PPO | Admitting: Internal Medicine

## 2022-10-09 VITALS — BP 126/74 | HR 76 | Wt 293.0 lb

## 2022-10-09 DIAGNOSIS — M353 Polymyalgia rheumatica: Secondary | ICD-10-CM

## 2022-10-09 DIAGNOSIS — K5904 Chronic idiopathic constipation: Secondary | ICD-10-CM

## 2022-10-09 DIAGNOSIS — Z86010 Personal history of colonic polyps: Secondary | ICD-10-CM | POA: Diagnosis not present

## 2022-10-09 DIAGNOSIS — Z800 Family history of malignant neoplasm of digestive organs: Secondary | ICD-10-CM | POA: Diagnosis not present

## 2022-10-09 DIAGNOSIS — Z7901 Long term (current) use of anticoagulants: Secondary | ICD-10-CM

## 2022-10-09 MED ORDER — NA SULFATE-K SULFATE-MG SULF 17.5-3.13-1.6 GM/177ML PO SOLN
1.0000 | Freq: Once | ORAL | 0 refills | Status: AC
  Filled 2022-10-09: qty 354, 1d supply, fill #0

## 2022-10-09 NOTE — Patient Instructions (Signed)
_______________________________________________________  If you are age 57 or older, your body mass index should be between 23-30. Your Body mass index is 47.29 kg/m. If this is out of the aforementioned range listed, please consider follow up with your Primary Care Provider.  If you are age 46 or younger, your body mass index should be between 19-25. Your Body mass index is 47.29 kg/m. If this is out of the aformentioned range listed, please consider follow up with your Primary Care Provider.   ________________________________________________________  The [facility] providers would like to encourage you to use [facility] to communicate with providers for non-urgent requests or questions.  Due to long hold times on the telephone, sending your provider a message by [facility] may be a faster and more efficient way to get a response.  Please allow 48 business hours for a response.  Please remember that this is for non-urgent requests.  _______________________________________________________  Monique Clark have been scheduled for a colonoscopy. Please follow written instructions given to you at your visit today.  Please pick up your prep supplies at the pharmacy within the next 1-3 days. If you use inhalers (even only as needed), please bring them with you on the day of your procedure.

## 2022-10-09 NOTE — Progress Notes (Signed)
HISTORY OF PRESENT ILLNESS:  Monique Clark is a pleasant 57 y.o. female with a history of sessile serrated polyp on colonoscopy May 12, 2017.  She presents today regarding surveillance colonoscopy.  Since her last procedure she reports to me that her sister was diagnosed with metastatic colon cancer approximately 1 year ago.  Patient herself is also had a change in her medical history.  This includes developing atrial fibrillation about 3 years ago.  Currently on Xarelto.  Regular rate and rhythm on medication.  She is followed by cardiology in [facility].  She also underwent sleeve gastrectomy August 2023.  That went well.  She is lost 45 pounds.  She tells me that she was approved to come off her chronic anticoagulant for few days around her surgery.  Her GI review of systems is remarkable for constipation for which she takes Colace with good results.  No other issues.  Review of blood work from August 19, 2022 shows mild anemia with hemoglobin 11.2..  CT scan of the abdomen and pelvis August 18, 2022 revealed no acute findings.  She is status postcholecystectomy  REVIEW OF SYSTEMS:  All non-GI ROS negative unless otherwise stated in the HPI except for sinus allergy trouble, arthritis, back pain, fatigue, headaches, cramps, insomnia  Past Medical History:  Diagnosis Date   Abscess    Arthritis    Atrial fibrillation ([place])    Cardiac arrhythmia due to congenital heart disease    trace MR 08/15/20 echo ([place])   CVID (common variable immunodeficiency) ([place]) 2015   Diverticulosis    Elevated cholesterol    GERD (gastroesophageal reflux disease)    High blood pressure    Non Hodgkin's lymphoma ([place]) 1998   Obesity    Personal history of gestational diabetes    PMR (polymyalgia rheumatica) (HCC)    Pneumonia    PONV (postoperative nausea and vomiting)    POTS (postural orthostatic tachycardia syndrome)    Pre-diabetes    UTI (urinary tract infection)      Past Surgical History:  Procedure Laterality Date   ABDOMINAL HYSTERECTOMY     APPENDECTOMY     CESAREAN SECTION     GALLBLADDER SURGERY     JOINT REPLACEMENT Left    hip   JOINT REPLACEMENT Right    Right Hip 2022   LAPAROSCOPIC GASTRIC SLEEVE RESECTION  04/29/2022   left shoulder repair     LUMBAR LAMINECTOMY N/A 09/18/2021   Procedure: RIGHT LUMBAR FOUR-FIVE MICRODISCECTOMY;  Surgeon: Marybelle Killings, MD;  Location: [facility];  Service: Orthopedics;  Laterality: N/A;   LUNG REMOVAL, PARTIAL Right 2009   VATS, wedge resection partial lobectomy, done at [place] Bilateral    SPLENECTOMY, TOTAL  1998   TONSILLECTOMY AND ADENOIDECTOMY     UPPER GI ENDOSCOPY N/A 04/29/2022   Procedure: UPPER GI ENDOSCOPY;  Surgeon: Kinsinger, Arta Bruce, MD;  Location[facility];  Service: General;  Laterality: N/A;    Social History Monique Clark  reports that she quit smoking about 20 years ago. Her smoking use included cigarettes. She has a 10.00 pack-year smoking history. She has never used smokeless tobacco. She reports that she does not drink alcohol and does not use drugs.  family history includes Atrial fibrillation in her maternal grandfather, maternal grandmother, maternal uncle, and mother; Basal cell carcinoma in her brother; Cancer in her father; Colon cancer in her sister; Heart attack in her maternal grandfather.  Allergies  Allergen Reactions   Chlorhexidine Rash  Tetanus-Diphtheria Toxoids Td Swelling    angioedema   Nizatidine Hives    Axid   Promethazine Other (See Comments)    High doses causes confusion   Tetanus Toxoids Other (See Comments)    angioedema       PHYSICAL EXAMINATION: Vital signs: BP 126/74   Pulse 76   Wt 293 lb (132.9 kg)   BMI 47.29 kg/m   Constitutional: Pleasant, generally well-appearing, obese, no acute distress Psychiatric: alert and oriented x3, cooperative Eyes: extraocular movements intact, anicteric, conjunctiva  pink Mouth: oral pharynx moist, no lesions Neck: supple no lymphadenopathy Cardiovascular: heart regular rate and rhythm, no murmur Lungs: clear to auscultation bilaterally Abdomen: soft, nontender, nondistended, no obvious ascites, no peritoneal signs, normal bowel sounds, no organomegaly Rectal: Deferred until colonoscopy Extremities: no clubbing or cyanosis.  Trace lower extremity edema bilaterally Skin: no lesions on visible extremities Neuro: No focal deficits.  Cranial nerves intact  ASSESSMENT:  1.  Personal history of sessile serrated polyp 2018 2.  Family history of colon cancer in her sister 67.  Morbid obesity status post sleeve gastrectomy August 2023.  Current BMI 47.2 4.  General medical problems including history of atrial fibrillation on chronic Xarelto 5.  Functional constipation 6.  Status post cholecystectomy   PLAN:  1.  Schedule surveillance colonoscopy.  The patient is high risk given her BMI and the need to address chronic anticoagulation therapy.The nature of the procedure, as well as the risks, benefits, and alternatives were carefully and thoroughly reviewed with the patient. Ample time for discussion and questions allowed. The patient understood, was satisfied, and agreed to proceed. 2.  Hold Xarelto 2 days prior to the procedure.  Patient understands the pros and cons of interrupting anticoagulation therapy. 3.  Laxative of choice 4.  Ongoing general medical care with PCP

## 2022-10-10 ENCOUNTER — Other Ambulatory Visit: Payer: Self-pay

## 2022-10-11 LAB — C-REACTIVE PROTEIN: CRP: 54.1 mg/L — ABNORMAL HIGH (ref <8.0)

## 2022-10-11 LAB — SEDIMENTATION RATE: Sed Rate: 104 mm/h — ABNORMAL HIGH (ref 0–30)

## 2022-10-13 ENCOUNTER — Other Ambulatory Visit: Payer: Self-pay | Admitting: *Deleted

## 2022-10-13 DIAGNOSIS — M353 Polymyalgia rheumatica: Secondary | ICD-10-CM

## 2022-10-13 DIAGNOSIS — G4733 Obstructive sleep apnea (adult) (pediatric): Secondary | ICD-10-CM | POA: Diagnosis not present

## 2022-10-13 MED ORDER — PREDNISONE 10 MG PO TABS
10.0000 mg | ORAL_TABLET | Freq: Every day | ORAL | 2 refills | Status: DC
  Filled 2022-10-13: qty 30, 30d supply, fill #0
  Filled 2022-10-30 – 2022-11-07 (×2): qty 30, 30d supply, fill #1

## 2022-10-13 NOTE — Telephone Encounter (Signed)
Will need to have an appointment to discuss treatment detail and also needs baseline labs for medication monitoring.

## 2022-10-13 NOTE — Telephone Encounter (Signed)
Patient requested a refill on Prednisone. She also states it was mentioned about potentially starting Humira. Patient would like to know if she needs an appointment or how you would like to proceed.   Next Visit: Due April 2024. Message sent to the front to schedule.   Last Visit: 09/18/2022  Last Fill: 08/20/2022  Dx: PMR (polymyalgia rheumatica)   Current Dose per office note on 09/18/2022: remains on the prednisone at the 10 mg dose per day.   Okay to refill Prednisone?

## 2022-10-13 NOTE — Progress Notes (Signed)
Sed rate and CRP remain very high, about the same as a few months ago.

## 2022-10-14 ENCOUNTER — Encounter: Payer: Self-pay | Admitting: Orthopaedic Surgery

## 2022-10-14 ENCOUNTER — Other Ambulatory Visit (HOSPITAL_COMMUNITY): Payer: Self-pay

## 2022-10-14 ENCOUNTER — Other Ambulatory Visit: Payer: Self-pay

## 2022-10-14 ENCOUNTER — Encounter: Payer: Self-pay | Admitting: *Deleted

## 2022-10-14 DIAGNOSIS — M5126 Other intervertebral disc displacement, lumbar region: Secondary | ICD-10-CM

## 2022-10-14 NOTE — Telephone Encounter (Signed)
Sent message via my chart to advise.

## 2022-10-15 DIAGNOSIS — D801 Nonfamilial hypogammaglobulinemia: Secondary | ICD-10-CM | POA: Diagnosis not present

## 2022-10-15 DIAGNOSIS — Z79899 Other long term (current) drug therapy: Secondary | ICD-10-CM | POA: Diagnosis not present

## 2022-10-16 ENCOUNTER — Ambulatory Visit: Payer: Self-pay

## 2022-10-16 ENCOUNTER — Ambulatory Visit: Payer: Commercial Managed Care - PPO | Admitting: Orthopedic Surgery

## 2022-10-16 VITALS — BP 156/87 | HR 67 | Ht 66.0 in | Wt 284.0 lb

## 2022-10-16 DIAGNOSIS — M5416 Radiculopathy, lumbar region: Secondary | ICD-10-CM | POA: Diagnosis not present

## 2022-10-16 DIAGNOSIS — M5116 Intervertebral disc disorders with radiculopathy, lumbar region: Secondary | ICD-10-CM

## 2022-10-16 NOTE — Telephone Encounter (Signed)
noted 

## 2022-10-16 NOTE — Progress Notes (Signed)
Orthopedic Spine Surgery Office Note  Assessment: Patient is a 57 y.o. female with history of right-sided L4-5 hemilaminotomy and microdiscectomy who presents with low back pain that radiates into the left lower extremity.  MRI shows recurrent disc herniation, more on the left side, at L4-5   Plan: -Explained that initially conservative treatment is tried as a significant number of patients may experience relief with these treatment modalities. Discussed that the conservative treatments include:  -activity modification  -physical therapy  -over the counter pain medications  -medrol dosepak  -lumbar steroid injections -Patient has tried activity modification, physical therapy, over-the-counter medications, steroid injection -I explained to her that her risk of infection would be higher with surgical intervention due to her morbid obesity and the fact that this would be a revision surgery.  I also told her that in revision surgery her risk of durotomy is higher.  I told her that I have a BMI cutoff of 35 for elective surgeries -She is also on chronic steroids which would increase her infection risk -I gave her a weight goal of 220lbs today -I am happy to help Dr. Lorin Mercy with surgery if he thinks benefits outweigh the risks of operative intervention.  Will discuss with him   Patient expressed understanding of the plan and all questions were answered to the patient's satisfaction.   ___________________________________________________________________________   History:  Patient is a 57 y.o. female who presents today for lumbar spine.  Patient underwent a L4-5 right-sided hemilaminotomy and microdiscectomy with Dr. Lorin Mercy on 09/18/2021.  Her right-sided lower extremity radicular pain improved significantly after surgery.  During her postoperative follow-up, though, she developed left-sided symptoms.  Had pain radiating down the left leg along the lateral aspect and into the leg and dorsal foot.   She also had worsening back pain.  She still having no right-sided symptoms.  Gets paresthesias and numbness down the left leg in the same distribution as the pain.  No other numbness or paresthesias.  Feels that she cannot function or do her job because of the pain.    Weakness: Denies Symptoms of imbalance: Denies Paresthesias and numbness: Yes, into left leg Bowel or bladder incontinence: Denies Saddle anesthesia: Denies  Treatments tried: activity modification, physical therapy, over-the-counter medications, steroid injection  Review of systems: Denies fevers and chills, night sweats, unexplained weight loss, history of cancer. Has had pain that wakes her at night  Past medical history: HTN Atrial fibrillation POTS Mitral valve regurgitation GERD Diverticulosis Osteoarthritis HLD Polymyalgia rheumatica Pre-diabetes  Allergies: Nizatidine, promethazine, tetanus, chlorhexidine   Past surgical history:  Hysterectomy Appendectomy C-section Bilateral THA Gastric sleeve Left shoulder RCR L4-5 microdiscectomy VATS with wedge resection Splenectomy Tonsillectomy and adenoidectomy  Social history: Denies use of nicotine product (smoking, vaping, patches, smokeless) Alcohol use: denies Denies recreational drug use  Physical Exam:  BMI of 45  General: no acute distress, appears stated age Neurologic: alert, answering questions appropriately, following commands Respiratory: unlabored breathing on room air, symmetric chest rise Psychiatric: appropriate affect, normal cadence to speech   MSK (spine):  -Strength exam      Left  Right EHL    5/5  5/5 TA    5/5  5/5 GSC    5/5  5/5 Knee extension  5/5  5/5 Hip flexion   5/5  5/5  -Sensory exam    Sensation intact to light touch in L3-S1 nerve distributions of bilateral lower extremities  -Straight leg raise: negative -Contralateral straight leg raise: negative -Clonus: no beats bilaterally  Imaging: XR of  the lumbar spine from 10/16/2022 and 05/13/2022 was independently reviewed and interpreted, showing disc height loss at L4/5. No fracture or dislocation. No evidence of instability on flexion/extension views.   MRI of the lumbar spine from 07/12/2022 was independently reviewed and interpreted, showing central/left sided disc herniation at L4/5. Foraminal stenosis bilaterally at L4/5. Right sided laminotomy defect at L4/5. Lateral recess stenosis at L5/S1.    Patient name: Monique Clark Patient MRN: [id number] Date of visit: 10/16/22

## 2022-10-19 DIAGNOSIS — D801 Nonfamilial hypogammaglobulinemia: Secondary | ICD-10-CM | POA: Diagnosis not present

## 2022-10-23 ENCOUNTER — Other Ambulatory Visit: Payer: Self-pay | Admitting: Internal Medicine

## 2022-10-23 ENCOUNTER — Other Ambulatory Visit: Payer: Self-pay | Admitting: Orthopaedic Surgery

## 2022-10-23 ENCOUNTER — Telehealth: Payer: Self-pay | Admitting: Orthopaedic Surgery

## 2022-10-23 DIAGNOSIS — M5126 Other intervertebral disc displacement, lumbar region: Secondary | ICD-10-CM

## 2022-10-23 DIAGNOSIS — M5416 Radiculopathy, lumbar region: Secondary | ICD-10-CM

## 2022-10-23 NOTE — Telephone Encounter (Signed)
Received call from [facility] with [facility] advised patient would like to have Aquatic Therapy. Francesca Jewett said all she need is a diagnosis and what patient is coming to therapy for. Francesca Jewett said she will also need a signature signing off on it by the provider. The ph# is [phone]   The fax# is [phone]

## 2022-10-23 NOTE — Telephone Encounter (Signed)
Referral entered and faxed

## 2022-10-24 ENCOUNTER — Other Ambulatory Visit (HOSPITAL_COMMUNITY): Payer: Self-pay

## 2022-10-24 ENCOUNTER — Telehealth: Payer: Self-pay | Admitting: Orthopedic Surgery

## 2022-10-24 ENCOUNTER — Other Ambulatory Visit: Payer: Self-pay

## 2022-10-24 MED ORDER — VITAMIN D3 1.25 MG (50000 UT) PO CAPS
1.2500 mg | ORAL_CAPSULE | ORAL | 5 refills | Status: AC
  Filled 2022-10-24: qty 8, 28d supply, fill #0
  Filled 2022-12-04: qty 8, 28d supply, fill #1
  Filled 2023-02-08: qty 8, 28d supply, fill #2
  Filled 2023-03-13: qty 8, 28d supply, fill #3
  Filled 2023-06-29: qty 8, 28d supply, fill #4
  Filled 2023-10-15: qty 8, 28d supply, fill #5

## 2022-10-24 NOTE — Telephone Encounter (Signed)
I haven't sent anything. Thanks.

## 2022-10-24 NOTE — Telephone Encounter (Signed)
I have not faxed anything on this patient.

## 2022-10-24 NOTE — Telephone Encounter (Signed)
[employer] called and avised they received a fax for a medical release form and they are a Librarian, academic. Someone sent it in error. Please advise.

## 2022-10-26 DIAGNOSIS — G4733 Obstructive sleep apnea (adult) (pediatric): Secondary | ICD-10-CM | POA: Diagnosis not present

## 2022-10-28 ENCOUNTER — Telehealth: Payer: Self-pay | Admitting: Internal Medicine

## 2022-10-28 NOTE — Telephone Encounter (Signed)
New instructions completed and put in mychart.

## 2022-10-28 NOTE — Telephone Encounter (Signed)
Patient reschedule. Can you update prep instructions.

## 2022-10-29 ENCOUNTER — Telehealth: Payer: Self-pay | Admitting: Orthopaedic Surgery

## 2022-10-29 NOTE — Telephone Encounter (Signed)
Referral was faxed on 10/23/2022. I called Francesca Jewett and she states that she has not received it. Refaxed to [phone] and fax transmission states it was sent successfully. I called patient and left voicemail advising.

## 2022-10-29 NOTE — Telephone Encounter (Signed)
Patient asking about her order for [facility] therapy hasn't been sent to [facility] [address], [place]--[phone].. please advise

## 2022-10-30 ENCOUNTER — Telehealth: Payer: Self-pay | Admitting: Orthopaedic Surgery

## 2022-10-30 ENCOUNTER — Other Ambulatory Visit: Payer: Self-pay | Admitting: Orthopaedic Surgery

## 2022-10-30 MED ORDER — GABAPENTIN 100 MG PO CAPS
ORAL_CAPSULE | ORAL | 0 refills | Status: DC
  Filled 2022-10-30: qty 90, 30d supply, fill #0

## 2022-10-30 MED ORDER — TRAMADOL HCL 50 MG PO TABS
50.0000 mg | ORAL_TABLET | Freq: Two times a day (BID) | ORAL | 0 refills | Status: DC | PRN
  Filled 2022-10-30: qty 30, 15d supply, fill #0

## 2022-10-30 NOTE — Telephone Encounter (Signed)
Faxed order to the provided number - received confirmation. Also, emailed Rosemary at [email or web address].

## 2022-10-30 NOTE — Telephone Encounter (Signed)
Received call from [facility] with [facility] asked if Referral could be faxed to another fax number because she did not get the referral.  The fax number is [phone]  Please see previous note     The number to contact Monique Clark is [phone]

## 2022-10-30 NOTE — Telephone Encounter (Signed)
Could you please fax referral for aquatic therapy to number provided? Thank.

## 2022-10-31 ENCOUNTER — Other Ambulatory Visit: Payer: Self-pay

## 2022-11-01 DIAGNOSIS — H66002 Acute suppurative otitis media without spontaneous rupture of ear drum, left ear: Secondary | ICD-10-CM | POA: Diagnosis not present

## 2022-11-01 DIAGNOSIS — H60393 Other infective otitis externa, bilateral: Secondary | ICD-10-CM | POA: Diagnosis not present

## 2022-11-01 DIAGNOSIS — J0100 Acute maxillary sinusitis, unspecified: Secondary | ICD-10-CM | POA: Diagnosis not present

## 2022-11-05 ENCOUNTER — Other Ambulatory Visit (HOSPITAL_COMMUNITY): Payer: Self-pay

## 2022-11-06 ENCOUNTER — Other Ambulatory Visit (HOSPITAL_COMMUNITY): Payer: Self-pay

## 2022-11-07 ENCOUNTER — Other Ambulatory Visit (HOSPITAL_COMMUNITY): Payer: Self-pay

## 2022-11-07 ENCOUNTER — Other Ambulatory Visit: Payer: Self-pay

## 2022-11-08 ENCOUNTER — Other Ambulatory Visit (HOSPITAL_COMMUNITY): Payer: Self-pay

## 2022-11-10 ENCOUNTER — Other Ambulatory Visit (HOSPITAL_COMMUNITY): Payer: Self-pay

## 2022-11-11 ENCOUNTER — Encounter: Payer: Commercial Managed Care - PPO | Admitting: Internal Medicine

## 2022-11-11 ENCOUNTER — Other Ambulatory Visit (HOSPITAL_COMMUNITY): Payer: Self-pay

## 2022-11-11 ENCOUNTER — Other Ambulatory Visit: Payer: Self-pay

## 2022-11-11 ENCOUNTER — Ambulatory Visit: Payer: Commercial Managed Care - PPO

## 2022-11-11 ENCOUNTER — Ambulatory Visit (AMBULATORY_SURGERY_CENTER): Payer: Commercial Managed Care - PPO | Admitting: *Deleted

## 2022-11-11 ENCOUNTER — Encounter (HOSPITAL_COMMUNITY): Payer: Self-pay

## 2022-11-11 VITALS — Ht 65.0 in | Wt 288.0 lb

## 2022-11-11 DIAGNOSIS — Z800 Family history of malignant neoplasm of digestive organs: Secondary | ICD-10-CM

## 2022-11-11 DIAGNOSIS — K5904 Chronic idiopathic constipation: Secondary | ICD-10-CM

## 2022-11-11 NOTE — Progress Notes (Signed)
No egg or soy allergy known to patient  No issues known to pt with past sedation with any surgeries or procedures Patient denies ever being told they had issues or difficulty with intubation  No FH of Malignant Hyperthermia Pt is not on diet pills Pt is not on  home 02  Pt is not on blood thinners  Pt has  issues with constipation  Pt is not on dialysis Pt denies any upcoming cardiac testing Gastric Sleeve placed 8/23 per pt Pt encouraged to use to use Singlecare or Goodrx to reduce cost  Patient's chart reviewed by Osvaldo Angst CNRA prior to previsit and patient appropriate for the [facility].  Previsit completed and red dot placed by patient's name on their procedure day (on provider's schedule).  . Weight per PT Visit by phone Instructions reviewed with pt and pt states understanding. Instructed to review again prior to procedure. Pt states they will.  Instructions sent by mail and my chart

## 2022-11-12 ENCOUNTER — Telehealth: Payer: Self-pay | Admitting: Internal Medicine

## 2022-11-12 DIAGNOSIS — M5416 Radiculopathy, lumbar region: Secondary | ICD-10-CM | POA: Diagnosis not present

## 2022-11-12 DIAGNOSIS — M79605 Pain in left leg: Secondary | ICD-10-CM | POA: Diagnosis not present

## 2022-11-12 DIAGNOSIS — M545 Low back pain, unspecified: Secondary | ICD-10-CM | POA: Diagnosis not present

## 2022-11-12 NOTE — Telephone Encounter (Signed)
Natalie from [facility] called and said they need a medications list faxed over to them. Best callback number is [phone]. Fax number is [phone].

## 2022-11-12 NOTE — Telephone Encounter (Signed)
I have faxed over medication list for patient as requested.

## 2022-11-13 ENCOUNTER — Other Ambulatory Visit (HOSPITAL_COMMUNITY): Payer: Self-pay

## 2022-11-13 DIAGNOSIS — G4733 Obstructive sleep apnea (adult) (pediatric): Secondary | ICD-10-CM | POA: Diagnosis not present

## 2022-11-14 ENCOUNTER — Other Ambulatory Visit (HOSPITAL_COMMUNITY): Payer: Self-pay

## 2022-11-16 NOTE — Progress Notes (Unsigned)
Office Visit Note  Patient: Monique Clark             Date of Birth: 1966/05/01           MRN: [id number]             PCP: Hoyt Koch, MD Referring: Hoyt Koch, * Visit Date: 11/17/2022   Subjective:  No chief complaint on file.   History of Present Illness: Monique Clark is a 57 y.o. female here for follow up ***   Previous HPI 09/18/22 [name] is a 57 y.o. female here for follow up for PMR on prednisone 10 mg daily. Since the past visit she had a hospital admission for hypoxia suspected as viral URI trigger of asthma exacerbation. She stopped methotrexate at that time and has been holding off resuming the medication concern about contributing to infection.  Currently is partly through a course of Augmentin for the persistent upper respiratory infection.  She had lumbar spine injection for ongoing back pain without a great relief of symptoms this time.   Previous HPI 05/20/22 [name] is a 57 y.o. female here for follow up for PMR on methotrexate 15 mg p.o. weekly.  Since her last visit she underwent gastric sleeve resection surgery for weight reduction.  This has still been in the recovery process.  She discontinued the prednisone perioperatively and has been off this.  She notices multiple symptoms are worse with joint pain and swelling especially in shoulders and elbows.  Not as much lower extremity swelling.   Previous HPI 01/06/2022 [name] is a 57 y.o. female here for follow up for PMR on prednisone 10 mg daily and started methotrexate 10 mg PO weekly last month due to persistent inflammatory lab elevations.  She continues having fatigue is persistent.  Shortness of breath with walking and exertion is slightly improved.  She has not noticed any side effects or intolerance to methotrexate.  She is continuing to try to work on her exercise as a part of preparation for bariatric surgery also awaiting upcoming cardiac evaluation.      Previous HPI 12/02/21 [name] is a 57 y.o. female here for muscle pain and weakness in shoulders and legs concern for history of PMR previously treated with long term prednisone.  She has a history of CVID on IVIG, non hodgkins lymphoma, also has history of bilateral hip replacement. Recently had microdiscectomy about 6 weeks prior. Started prednisone 40 mg daily last month decreased to 20 mg daily and now is down to 10 mg dose. She felt her symptoms improve about 80-90% when starting prednisone again and tapering has been okay, only slight increase with this dose reduction. She does not see any particular swelling, discoloration, or rashes. She has noticed some hand tremor more when using her hands that is not typical for her.   No Rheumatology ROS completed.   PMFS History:  Patient Active Problem List   Diagnosis Date Noted   Nocturnal hypoxemia 08/29/2022   Pulmonary nodule 08/16/2022   Protrusion of lumbar intervertebral disc 07/25/2022   OSA on CPAP 05/19/2022   Impingement syndrome of left shoulder 05/13/2022   Morbid (severe) obesity due to excess calories ([place]) 04/29/2022   Leukocytosis 01/20/2022   Morbid obesity ([place]) 01/01/2022   Snoring 01/01/2022   Non-restorative sleep 01/01/2022   Chronic fatigue and immune dysfunction syndrome ([place]) 01/01/2022   High risk medication use 12/02/2021   Otitis externa 11/05/2021   PMR (polymyalgia rheumatica) ([place]) 11/05/2021  Insomnia 05/28/2021   Malignant (primary) neoplasm, unspecified ([place]) 03/08/2021   Personal history of urinary (tract) infections 03/08/2021   Personal history of pneumonia (recurrent) 03/08/2021   Elevated serum hCG 07/02/2020   POTS (postural orthostatic tachycardia syndrome) 03/08/2020   Intermittent claudication ([place]) 01/04/2020   Mixed hyperlipidemia 03/21/2019   Palpitations 03/21/2019   Paroxysmal atrial fibrillation (HCC)    Localized swelling of left lower extremity    Lightheadedness     Essential hypertension    Cellulitis 06/22/2017   Mitral valve regurgitation 06/22/2017   CVID (common variable immunodeficiency) ([place]) 05/07/2015   Prediabetes 01/20/2013   History of non-Hodgkin's lymphoma 12/21/2012   Low back pain radiating to right lower extremity 12/10/2012   Mononucleosis 12/08/1983    Past Medical History:  Diagnosis Date   Abscess    Arthritis    Atrial fibrillation ([place])    Cardiac arrhythmia due to congenital heart disease    trace MR 08/15/20 echo ([place])   CVID (common variable immunodeficiency) ([place]) 2015   Diverticulosis    Elevated cholesterol    GERD (gastroesophageal reflux disease)    High blood pressure    Non Hodgkin's lymphoma ([place]) 1998   Obesity    Personal history of gestational diabetes    PMR (polymyalgia rheumatica) (HCC)    Pneumonia    PONV (postoperative nausea and vomiting)    POTS (postural orthostatic tachycardia syndrome)    Pre-diabetes    UTI (urinary tract infection)     Family History  Problem Relation Age of Onset   Atrial fibrillation Mother    Cancer Father        Colon   Colon cancer Sister    Basal cell carcinoma Brother    Atrial fibrillation Maternal Uncle    Atrial fibrillation Maternal Grandmother    Atrial fibrillation Maternal Grandfather    Heart attack Maternal Grandfather    Ovarian cancer Neg Hx    Past Surgical History:  Procedure Laterality Date   ABDOMINAL HYSTERECTOMY     APPENDECTOMY     CESAREAN SECTION     GALLBLADDER SURGERY     JOINT REPLACEMENT Left    hip   JOINT REPLACEMENT Right    Right Hip 2022   LAPAROSCOPIC GASTRIC SLEEVE RESECTION  04/29/2022   left shoulder repair     LUMBAR LAMINECTOMY N/A 09/18/2021   Procedure: RIGHT LUMBAR FOUR-FIVE MICRODISCECTOMY;  Surgeon: Marybelle Killings, MD;  Location: [facility];  Service: Orthopedics;  Laterality: N/A;   LUNG REMOVAL, PARTIAL Right 2009   VATS, wedge resection partial lobectomy, done at [facility]  Bilateral    SPLENECTOMY, TOTAL  1998   TONSILLECTOMY AND ADENOIDECTOMY     UPPER GI ENDOSCOPY N/A 04/29/2022   Procedure: UPPER GI ENDOSCOPY;  Surgeon: Kieth Brightly, Arta Bruce, MD;  Location: [facility];  Service: General;  Laterality: N/A;   Social History   Social History Narrative   Not on file   Immunization History  Administered Date(s) Administered   Influenza-Unspecified 07/27/2020, 07/08/2022   PFIZER(Purple Top)SARS-COV-2 Vaccination 09/24/2019, 10/12/2019     Objective: Vital Signs: There were no vitals taken for this visit.   Physical Exam   Musculoskeletal Exam: ***  CDAI Exam: CDAI Score: -- Patient Global: --; Provider Global: -- Swollen: --; Tender: -- Joint Exam 11/17/2022   No joint exam has been documented for this visit   There is currently no information documented on the homunculus. Go to the Rheumatology activity and complete the homunculus  joint exam.  Investigation: No additional findings.  Imaging: No results found.  Recent Labs: Lab Results  Component Value Date   WBC 19.9 (H) 08/19/2022   HGB 11.2 (L) 08/19/2022   PLT 395 08/19/2022   NA 141 08/19/2022   K 3.8 08/19/2022   CL 103 08/19/2022   CO2 28 08/19/2022   GLUCOSE 118 (H) 08/19/2022   BUN 17 08/19/2022   CREATININE 1.02 (H) 08/19/2022   BILITOT 0.2 (L) 08/16/2022   ALKPHOS 70 08/16/2022   AST 31 08/16/2022   ALT 22 08/16/2022   PROT 6.8 08/16/2022   ALBUMIN 3.6 08/16/2022   CALCIUM 9.4 08/19/2022   GFRAA >60 03/18/2020    Speciality Comments: No specialty comments available.  Procedures:  No procedures performed Allergies: Chlorhexidine, Tetanus-diphtheria toxoids td, Nizatidine, Promethazine, and Tetanus toxoids   Assessment / Plan:     Visit Diagnoses: No diagnosis found.  ***  Orders: No orders of the defined types were placed in this encounter.  No orders of the defined types were placed in this encounter.    Follow-Up Instructions: No follow-ups on  file.   Collier Salina, MD  Note - This record has been created using [employer].  Chart creation errors have been sought, but may not always  have been located. Such creation errors do not reflect on  the standard of medical care.

## 2022-11-17 ENCOUNTER — Other Ambulatory Visit (HOSPITAL_COMMUNITY): Payer: Self-pay

## 2022-11-17 ENCOUNTER — Ambulatory Visit: Payer: Commercial Managed Care - PPO | Attending: Internal Medicine | Admitting: Internal Medicine

## 2022-11-17 ENCOUNTER — Encounter: Payer: Self-pay | Admitting: Internal Medicine

## 2022-11-17 ENCOUNTER — Telehealth: Payer: Self-pay | Admitting: Pharmacist

## 2022-11-17 VITALS — BP 128/74 | HR 57 | Resp 17 | Ht 65.0 in | Wt 294.0 lb

## 2022-11-17 DIAGNOSIS — M0600 Rheumatoid arthritis without rheumatoid factor, unspecified site: Secondary | ICD-10-CM | POA: Insufficient documentation

## 2022-11-17 DIAGNOSIS — M353 Polymyalgia rheumatica: Secondary | ICD-10-CM

## 2022-11-17 DIAGNOSIS — Z79899 Other long term (current) drug therapy: Secondary | ICD-10-CM | POA: Diagnosis not present

## 2022-11-17 DIAGNOSIS — M545 Low back pain, unspecified: Secondary | ICD-10-CM | POA: Diagnosis not present

## 2022-11-17 DIAGNOSIS — M5416 Radiculopathy, lumbar region: Secondary | ICD-10-CM | POA: Diagnosis not present

## 2022-11-17 DIAGNOSIS — Z8572 Personal history of non-Hodgkin lymphomas: Secondary | ICD-10-CM | POA: Diagnosis not present

## 2022-11-17 DIAGNOSIS — M79605 Pain in left leg: Secondary | ICD-10-CM | POA: Diagnosis not present

## 2022-11-17 NOTE — Progress Notes (Unsigned)
Pharmacy Note Subjective: Patient presents today to [facility] for follow up office visit.    Patient seen by the pharmacist for counseling on Kevzara for rheumatoid arthritis and polymyalgia rheumatica.    History of hyperlipidemia: {yes/no:20286}  History of diverticulitis: No  Objective:  CBC    Component Value Date/Time   WBC 19.9 (H) 08/19/2022 0311   RBC 3.86 (L) 08/19/2022 0311   HGB 11.2 (L) 08/19/2022 0311   HCT 34.7 (L) 08/19/2022 0311   PLT 395 08/19/2022 0311   MCV 89.9 08/19/2022 0311   MCH 29.0 08/19/2022 0311   MCHC 32.3 08/19/2022 0311   RDW 17.9 (H) 08/19/2022 0311   LYMPHSABS 2.8 08/16/2022 1632   MONOABS 0.8 08/16/2022 1632   EOSABS 0.0 08/16/2022 1632   BASOSABS 0.0 08/16/2022 1632    CMP     Component Value Date/Time   NA 141 08/19/2022 0311   K 3.8 08/19/2022 0311   CL 103 08/19/2022 0311   CO2 28 08/19/2022 0311   GLUCOSE 118 (H) 08/19/2022 0311   BUN 17 08/19/2022 0311   CREATININE 1.02 (H) 08/19/2022 0311   CREATININE 0.78 05/20/2022 1603   CALCIUM 9.4 08/19/2022 0311   PROT 6.8 08/16/2022 1632   ALBUMIN 3.6 08/16/2022 1632   AST 31 08/16/2022 1632   ALT 22 08/16/2022 1632   ALKPHOS 70 08/16/2022 1632   BILITOT 0.2 (L) 08/16/2022 1632   GFRNONAA >60 08/19/2022 0311   GFRAA >60 03/18/2020 1727     Baseline Immunosuppressant Therapy Labs  TB GOLD    Hepatitis Panel    HIV Lab Results  Component Value Date   HIV Non Reactive 03/13/2019    Immunoglobulins    SPEP    Latest Ref Rng & Units 08/16/2022    4:32 PM  Serum Protein Electrophoresis  Total Protein 6.5 - 8.1 g/dL 6.8     G6PD No results found for: "G6PDH"  TPMT No results found for: "TPMT"   Lipid Panel Lab Results  Component Value Date   CHOL 222 (H) 03/13/2019   HDL 59 03/13/2019   LDLCALC 150 (H) 03/13/2019   TRIG 63 03/13/2019   CHOLHDL 3.8 03/13/2019     Chest x-ray: ***  Assessment/Plan:  Counseled patient that Darcus Pester is an  IL-6 blocking agent.  Counseled patient on purpose, proper use, and adverse effects of Kevzara.  Reviewed the most common adverse effects including infections, infusion reactions, bowel injury, and rarely cancer and conditions of the nervous system.  Reviewed risk of lipid abnormalities and elevated liver enzymes. Discussed that there is the possibility of an increased risk of malignancy but it is not well understood if this increased risk is due to the medication or the disease state. Counseled patient that Darcus Pester should be held prior to scheduled surgery for infections or new antibiotic use. Counseled patient that Darcus Pester should be held prior to scheduled surgery. Recommend annual influenza, PCV 15 or PCV20 or Pneumovax 23, and Shingrix as indicated.  Reviewed the importance of regular labs while on Kevzara therapy including the need for routine lipid panel.  Will recheck lipid panel after 4-8 weeks of starting Kevzara, then every 6 months routinely. CBC and CMP will be monitored every 3 months. TB gold will be monitored annually. Standing orders placed.  Provided patient with medication education material and answered all questions.  Patient voiced understanding.  Patient consented to [facility].  Will upload consent into the media tab.  Reviewed storage instructions of Darcus Pester with patient.  Advised patient that initial Kevzara injection must be given in the office.  Will apply for Kevzara through patient's insurance for self-administration using auto-injector/prefilled syringe.  Patient dose will be Kevzara 200 mg every 14 days.  Prescription pending lab results and/or insurance approval.  Counseled patient that Actemra is an IL-6 blocking agent.  Counseled patient on purpose, proper use, and adverse effects of Actemra.  Reviewed the most common adverse effects including infections, infusion reactions, bowel injury, and rarely cancer and conditions of the nervous system.  Reviewed risk of lipid  abnormalities and elevated liver enzymes. Discussed that there is the possibility of an increased risk of malignancy but it is not well understood if this increased risk is due to the medication or the disease state. Counseled patient that Actemra should be held prior to scheduled surgery for infections or new antibiotic use. Counseled patient that Actemra should be held prior to scheduled surgery. Recommend annual influenza, PCV 15 or PCV20 or Pneumovax 23, and Shingrix as indicated.  Reviewed the importance of regular labs while on Actemra therapy including the need for routine lipid panel.  Will recheck lipid panel after 4-8 weeks of starting Actemra, then every 6 months routinely. CBC and CMP will be monitored every 3 months. TB gold will be monitored annually. Standing orders placed.  Provided patient with medication education material and answered all questions.  Patient voiced understanding.  Patient consented to Actemra.  Will upload consent into the media tab.   Patient dose will be 162 mg every 7 days based on weight >100 kg .  This is reserved as second-line since patient has PMR and Darcus Pester would be ideal.  Patient dose will be Kevzara 200 mg every 14 days.  Prescription pending lab results and/or insurance approval.  Knox Saliva, PharmD, MPH, BCPS, CPP Clinical Pharmacist (Rheumatology and Pulmonology)

## 2022-11-17 NOTE — Patient Instructions (Signed)
Sarilumab Injection What is this medication? SARILUMAB (sar IL ue mab) treats autoimmune conditions, such as arthritis. It works by slowing down an overactive immune system. It is a monoclonal antibody. This medicine may be used for other purposes; ask your health care provider or pharmacist if you have questions. COMMON BRAND NAME(S): KEVZARA What should I tell my care team before I take this medication? They need to know if you have any of these conditions: Cancer Diabetes Diverticulitis Hepatitis B or history of hepatitis B infection High cholesterol Immune system problems Infection, especially a viral infection, such as chickenpox, cold sores, herpes Liver disease Low blood cell levels (white cells, platelets, or red blood cells) Recent or upcoming vaccine Scheduled to have surgery Stomach or intestine problems Tuberculosis, a positive skin test for tuberculosis, or recent close contact with someone who has tuberculosis An unusual or allergic reaction to sarilumab, other medications, foods, dyes, or preservatives Pregnant or trying to get pregnant Breast-feeding How should I use this medication? This medication is injected under the skin. You will be taught how to prepare and give it. Take it as directed on the prescription label at the same time every day. Keep taking it unless your care team tells you to stop. It is important that you put your used needles and syringes in a special sharps container. Do not put them in a trash can. If you do not have a sharps container, call your pharmacist or care team to get one. A special MedGuide will be given to you by the pharmacist with each prescription and refill. Be sure to read this information carefully each time. Talk to your care team about the use of this medication in children. Special care may be needed. Overdosage: If you think you have taken too much of this medicine contact a poison control center or emergency room at once. NOTE:  This medicine is only for you. Do not share this medicine with others. What if I miss a dose? If you miss a dose, take it as soon as you can. If it is almost time for your next dose, take only that dose. Do not take double or extra doses. What may interact with this medication? Do not take this medication with any of the following: Live virus vaccines This medication may also interact with the following: Biologic medications, such as abatacept, adalimumab, anakinra, certolizumab, etanercept, golimumab, infliximab, ofatumumab, rituximab, secukinumab, tocilizumab, tofacitinib, ustekinumab This medication may affect how other medications work. Talk with your care team about all the medications you take. They may suggest changes to your treatment plan to lower the risk of side effects and to make sure your medications work as intended. This list may not describe all possible interactions. Give your health care provider a list of all the medicines, herbs, non-prescription drugs, or dietary supplements you use. Also tell them if you smoke, drink alcohol, or use illegal drugs. Some items may interact with your medicine. What should I watch for while using this medication? Visit your care team for regular checks on your progress. Tell your care team if your symptoms do not start to get better or if they get worse. You may need blood work done while you are taking this medication. This medication can increase your risk of getting an infection. Call your care team for advice if you get a fever, chills, sore throat, or other symptoms of a cold or flu. Do not treat yourself. Try to avoid being around people who are sick. If  you have not had the measles or chickenpox vaccines, tell your care team right away if you are around someone with these viruses. If you are going to need surgery or other procedure, tell your care team that you are using this medication. Talk to your care team about your risk of cancer. You  may be at risk for certain types of cancers if you take this medication. What side effects may I notice from receiving this medication? Side effects that you should report to your care team as soon as possible: Allergic reactions--skin rash, itching, hives, swelling of the face, lips, tongue, or throat Infection--fever, chills, cough, sore throat, wounds that don't heal, pain or trouble when passing urine, general feeling of discomfort or being unwell Stomach pain that is severe, does not go away, or gets worse Unusual bruising or bleeding Side effects that usually do not require medical attention (report to your care team if they continue or are bothersome): Pain, redness, or irritation at injection site Runny or stuffy nose Sore throat This list may not describe all possible side effects. Call your doctor for medical advice about side effects. You may report side effects to FDA at 1-800-FDA-1088. Where should I keep my medication? Keep out of the reach of children and pets. Store in a refrigerator or at room temperature between 20 and 25 degrees C (68 and 77 degrees F). Refrigeration (preferred): Store in the refrigerator. Do not freeze. Protect from light. Keep in the original container until you are ready to take it. Get rid of any unused medication after the expiration date. Room temperature: This medication may be stored at room temperature for up to 14 days. Protect from light. Keep it in the original carton until you are ready to take it. Get rid of any unused medication after 14 days or after it expires, whichever is first. To get rid of medications that are no longer needed or have expired: Take the medication to a medication take-back program. Ask your pharmacy or law enforcement to find a location. If you cannot return the medication, ask your pharmacist or care team how to get rid of this medication safely. NOTE: This sheet is a summary. It may not cover all possible information. If  you have questions about this medicine, talk to your doctor, pharmacist, or health care provider.  2023 Elsevier/Gold Standard (2021-11-28 00:00:00)  Tocilizumab Injection What is this medication? TOCILIZUMAB (TOE si LIZ ue mab) treats autoimmune conditions, such as arthritis. It works by slowing down an overactive immune system. It may also be used to treat severe COVID-19 in people who are hospitalized. It is a monoclonal antibody. This medicine may be used for other purposes; ask your health care provider or pharmacist if you have questions. COMMON BRAND NAME(S): Actemra What should I tell my care team before I take this medication? They need to know if you have any of these conditions: Cancer Diabetes Heart disease History of or current hepatitis B infection High blood pressure High cholesterol Immune system problems Infection Liver disease Low blood counts, such as low white cell, platelet, or red cell counts Multiple sclerosis Recent or upcoming vaccine Stomach or intestine problems Stroke An unusual or allergic reaction to tocilizumab, other medications, foods, dyes, or preservatives Pregnant or trying to get pregnant Breast-feeding How should I use this medication? This medication is injected into a vein or under the skin. It may be given by your care team in a hospital or clinic setting. It may also be  given at home. If you get this medication at home, you will be taught how to prepare and give it. Use it exactly as directed. Take it as directed on the prescription label. Keep taking it unless your care team tells you to stop. If you use a pen, be sure to take off the outer needle cover before using the dose. It is important that you put your used needles and syringes in a special sharps container. Do not put them in a trash can. If you do not have a sharps container, call your pharmacist or care team to get one. A special MedGuide will be given to you by the pharmacist with  each prescription and refill. Be sure to read this information carefully each time. Talk to your care team about the use of this medication in children. While it may be prescribed for children as young as 2 years for selected conditions, precautions do apply. Overdosage: If you think you have taken too much of this medicine contact a poison control center or emergency room at once. NOTE: This medicine is only for you. Do not share this medicine with others. What if I miss a dose? If you get this medication at the hospital or clinic: It is important not to miss your dose. Call your care team if you are unable to keep an appointment. If you give yourself this medication at home: If you miss a dose, take it as soon as you can. If it is almost time for your next dose, take only that dose. Do not take double or extra doses. Call your care team with questions. What may interact with this medication? Do not take this medication with any of the following: Live virus vaccines This medication may also interact with the following: Biologic medications, such as abatacept, adalimumab, anakinra, certolizumab, etanercept, golimumab, infliximab, rituximab, secukinumab, ustekinumab Certain medications for cholesterol, such as atorvastatin, lovastatin, simvastatin Cyclosporine Estrogen or progestin hormones Omeprazole Steroid medications, such as prednisone or cortisone Theophylline Vaccines Warfarin This list may not describe all possible interactions. Give your health care provider a list of all the medicines, herbs, non-prescription drugs, or dietary supplements you use. Also tell them if you smoke, drink alcohol, or use illegal drugs. Some items may interact with your medicine. What should I watch for while using this medication? Visit your care team for regular checks on your progress. Your condition will be monitored carefully while you are receiving this medication. Tell your care team if your symptoms do  not start to get better or if they get worse. You may need blood work done while taking this medication. You will be tested for tuberculosis (TB) before you start this medication. If your care team prescribes any medication for TB, you should start taking the TB medication before starting this medication. Make sure to finish the full course of TB medication. This medication may increase your risk of getting an infection. Call your care team for advice if you get a fever, chills, sore throat, or other symptoms of a cold or flu. Do not treat yourself. Try to avoid being around people who are sick. Talk to your care team about your risk of cancer. You may be more at risk for certain types of cancers if you take this medication. What side effects may I notice from receiving this medication? Side effects that you should report to your care team as soon as possible: Allergic reactions--skin rash, itching, hives, swelling of the face, lips, tongue, or throat  Infection--fever, chills, cough, sore throat, wounds that don't heal, pain or trouble when passing urine, general feeling of discomfort or being unwell Liver injury--right upper belly pain, loss of appetite, nausea, light-colored stool, dark yellow or brown urine, yellowing skin or eyes, unusual weakness or fatigue Stomach pain that is severe, does not go away, or gets worse Unusual bruising or bleeding Side effects that usually do not require medical attention (report to your care team if they continue or are bothersome): Dizziness Headache Increase in blood pressure Pain, redness, or irritation at injection site Runny or stuffy nose Sore throat Stomach pain This list may not describe all possible side effects. Call your doctor for medical advice about side effects. You may report side effects to FDA at 1-800-FDA-1088. Where should I keep my medication? Keep out of the reach of children and pets. You will be instructed on how to store this  medication. Get rid of any unused medication after the expiration date on the label. To get rid of medications that are no longer needed or have expired: Take the medication to a medication take-back program. Check with your pharmacy or law enforcement to find a location. If you cannot return the medication, ask your pharmacist or care team how to get rid of this medication safely. NOTE: This sheet is a summary. It may not cover all possible information. If you have questions about this medicine, talk to your doctor, pharmacist, or health care provider.  2023 Elsevier/Gold Standard (2021-12-20 00:00:00)

## 2022-11-17 NOTE — Telephone Encounter (Signed)
Pending OV note from today, please start Kevzara SQ BIV.   Dose: 262m SQ every 2 weeks  Once approved, patient will need to be enrolled into copay card since she is [place] Will be completing first dose in clinic  DKnox Saliva PharmD, MPH, BCPS, CPP Clinical Pharmacist (Rheumatology and Pulmonology)

## 2022-11-18 ENCOUNTER — Other Ambulatory Visit (HOSPITAL_COMMUNITY): Payer: Self-pay

## 2022-11-18 ENCOUNTER — Other Ambulatory Visit: Payer: Self-pay

## 2022-11-18 LAB — COMPLETE METABOLIC PANEL WITH GFR
AG Ratio: 1.4 (calc) (ref 1.0–2.5)
ALT: 13 U/L (ref 6–29)
AST: 16 U/L (ref 10–35)
Albumin: 3.7 g/dL (ref 3.6–5.1)
Alkaline phosphatase (APISO): 73 U/L (ref 37–153)
BUN/Creatinine Ratio: 36 (calc) — ABNORMAL HIGH (ref 6–22)
BUN: 26 mg/dL — ABNORMAL HIGH (ref 7–25)
CO2: 30 mmol/L (ref 20–32)
Calcium: 10 mg/dL (ref 8.6–10.4)
Chloride: 104 mmol/L (ref 98–110)
Creat: 0.73 mg/dL (ref 0.50–1.03)
Globulin: 2.7 g/dL (calc) (ref 1.9–3.7)
Glucose, Bld: 94 mg/dL (ref 65–99)
Potassium: 4.8 mmol/L (ref 3.5–5.3)
Sodium: 143 mmol/L (ref 135–146)
Total Bilirubin: 0.2 mg/dL (ref 0.2–1.2)
Total Protein: 6.4 g/dL (ref 6.1–8.1)
eGFR: 96 mL/min/{1.73_m2} (ref >=60)

## 2022-11-18 LAB — LIPID PANEL
Cholesterol: 212 mg/dL — ABNORMAL HIGH (ref <200)
HDL: 65 mg/dL (ref >=50)
LDL Cholesterol (Calc): 131 mg/dL (calc) — ABNORMAL HIGH
Non-HDL Cholesterol (Calc): 147 mg/dL (calc) — ABNORMAL HIGH (ref <130)
Total CHOL/HDL Ratio: 3.3 (calc) (ref <5.0)
Triglycerides: 64 mg/dL (ref <150)

## 2022-11-18 LAB — CBC WITH DIFFERENTIAL/PLATELET
Absolute Monocytes: 1557 cells/uL — ABNORMAL HIGH (ref 200–950)
Basophils Absolute: 52 cells/uL (ref 0–200)
Basophils Relative: 0.3 %
Eosinophils Absolute: 104 cells/uL (ref 15–500)
Eosinophils Relative: 0.6 %
HCT: 35.9 % (ref 35.0–45.0)
Hemoglobin: 11.7 g/dL (ref 11.7–15.5)
Lymphs Abs: 3010 cells/uL (ref 850–3900)
MCH: 28.1 pg (ref 27.0–33.0)
MCHC: 32.6 g/dL (ref 32.0–36.0)
MCV: 86.1 fL (ref 80.0–100.0)
MPV: 12.5 fL (ref 7.5–12.5)
Monocytes Relative: 9 %
Neutro Abs: 12577 cells/uL — ABNORMAL HIGH (ref 1500–7800)
Neutrophils Relative %: 72.7 %
Platelets: 363 10*3/uL (ref 140–400)
RBC: 4.17 10*6/uL (ref 3.80–5.10)
RDW: 13.8 % (ref 11.0–15.0)
Total Lymphocyte: 17.4 %
WBC: 17.3 10*3/uL — ABNORMAL HIGH (ref 3.8–10.8)

## 2022-11-18 MED ORDER — HIZENTRA 4 GM/20ML ~~LOC~~ SOLN
SUBCUTANEOUS | 11 refills | Status: AC
  Filled 2022-11-18: qty 360, 28d supply, fill #0
  Filled 2022-12-04: qty 360, fill #0

## 2022-11-18 NOTE — Progress Notes (Signed)
Cholesterol level is slightly high with total cholesterol of 212 down slightly from previous levels. White blood cell count remains high this is not surprising while taking 10 mg prednisone. Metabolic panel is fine.

## 2022-11-19 ENCOUNTER — Other Ambulatory Visit (HOSPITAL_COMMUNITY): Payer: Self-pay

## 2022-11-19 DIAGNOSIS — M545 Low back pain, unspecified: Secondary | ICD-10-CM | POA: Diagnosis not present

## 2022-11-19 DIAGNOSIS — M79605 Pain in left leg: Secondary | ICD-10-CM | POA: Diagnosis not present

## 2022-11-19 DIAGNOSIS — M5416 Radiculopathy, lumbar region: Secondary | ICD-10-CM | POA: Diagnosis not present

## 2022-11-19 NOTE — Telephone Encounter (Signed)
Submitted a Prior Authorization request to [facility] for [facility] via CoverMyMeds. Will update once we receive a response.  Key: [id number]  Knox Saliva, PharmD, MPH, BCPS, CPP Clinical Pharmacist (Rheumatology and Pulmonology)

## 2022-11-21 ENCOUNTER — Encounter: Payer: Self-pay | Admitting: Internal Medicine

## 2022-11-21 ENCOUNTER — Other Ambulatory Visit (HOSPITAL_COMMUNITY): Payer: Self-pay

## 2022-11-24 ENCOUNTER — Encounter: Payer: Commercial Managed Care - PPO | Attending: General Surgery | Admitting: Skilled Nursing Facility1

## 2022-11-24 ENCOUNTER — Encounter: Payer: Self-pay | Admitting: Skilled Nursing Facility1

## 2022-11-24 DIAGNOSIS — M5416 Radiculopathy, lumbar region: Secondary | ICD-10-CM | POA: Diagnosis not present

## 2022-11-24 DIAGNOSIS — M79605 Pain in left leg: Secondary | ICD-10-CM | POA: Diagnosis not present

## 2022-11-24 DIAGNOSIS — M545 Low back pain, unspecified: Secondary | ICD-10-CM | POA: Diagnosis not present

## 2022-11-24 NOTE — Progress Notes (Signed)
Bariatric Nutrition Follow-Up Visit Medical Nutrition Therapy  Appt Start Time: 10:00   End Time: 10:27  Surgery date: 04/29/2022 Surgery type: Sleeve Gastrectomy  NUTRITION ASSESSMENT  Anthropometrics  Start weight at [facility]: 324.9 lbs (date: 11/25/2021)  Height: 64.5 in Weight today: 292.6 lbs. BMI: 48.71 kg/m2     Clinical  Medical hx: GDM, cancer: non hodgkin lymphoma, arthritis, hyperlipidemia, hypertension, A-fib, diverticulosis, POTS, PMR Medications: Prednisone; will start humira Labs: BUN 26, WBC 17.3, cholesterol 212 Notable signs/symptoms: nothing noted Any previous deficiencies? No   Body Composition Scale 05/13/2022 06/24/2022 11/24/2022  Current Body Weight 308.0 296.7 292.7  Total Body Fat % 50.2 49.4 49.2  Visceral Fat '23 22 22  '$ Fat-Free Mass % 49.7 50.5 50.7   Total Body Water % 39.3 39.7 39.8  Muscle-Mass lbs 31.8 31.6 31.6  BMI 50.9 49.1 48.4  Body Fat Displacement            Torso  lbs 96.0 91.1 89.4         Left Leg  lbs 19.2 18.2 17.8         Right Leg  lbs 19.2 18.2 17.8         Left Arm  lbs 9.6 9.1 8.9         Right Arm   lbs 9.6 9.1 8.9     Lifestyle & Dietary Hx   Pt states she has a ruptured disc causing her pain.  Pt states she is a stress eater. Pt states she is now taking care of her 57 year old granddaughter and her 57 year old daughter with down syndrome so her stress level is high along with her ruptured disc.    Estimated daily fluid intake: 64 oz Estimated daily protein intake: 60 g Supplements: multivitamin and biotin and calcium Current average weekly physical activity: doing aqua therapy 2 times a week   24-Hr Dietary Recall First Meal: cheese stick or at work: graham crackers and 2 T peanut butter Snack: peanut butter  Second Meal: chicken, baked beans (no sugar) or at work: 91/2) panera warm bowl Snack: 2 T peanut butter  Third Meal: steak with mashed potatoes or at work: hamburger patty without bun + cheese + 4 wheat  crackers Snack: other half of salad or peanut butter chocolate  Beverages: water, propel, decaf black tea + splenda (in a day 16 packets), protein shake  Post-Op Goals/ Signs/ Symptoms Using straws: no Drinking while eating: no Chewing/swallowing difficulties: no Changes in vision: no Changes to mood/headaches: no Hair loss/changes to skin/nails: no Difficulty focusing/concentrating: no Sweating: no Limb weakness: no Dizziness/lightheadedness: no Palpitations: no  Carbonated/caffeinated beverages: no N/V/D/C/Gas: taking Murelax and colace  Abdominal pain: no Dumping syndrome: no    NUTRITION DIAGNOSIS  Overweight/obesity (Dodge-3.3) related to past poor dietary habits and physical inactivity as evidenced by completed bariatric surgery and following dietary guidelines for continued weight loss and healthy nutrition status.     NUTRITION INTERVENTION Nutrition counseling (C-1) and education (E-2) to facilitate bariatric surgery goals, including: Diet advancement to the next phase (phase 4) now including non-starchy vegetables The importance of consuming adequate calories as well as certain nutrients daily due to the body's need for essential vitamins, minerals, and fats The importance of daily physical activity and to reach a goal of at least 150 minutes of moderate to vigorous physical activity weekly (or as directed by their physician) due to benefits such as increased musculature and improved lab values The importance of intuitive eating specifically  learning hunger-satiety cues and understanding the importance of learning a new body: The importance of mindful eating to avoid grazing behaviors  Pt is encouraged to consume less processed foods, as they have the potential to negatively affect overall health. Processed foods often destroy or remove nutrients from the product and/or have added salt, sugar, and saturated fats. Although not all processed foods are a concern, it is advised to  have the majority of our foods be unprocessed or minimally processed as opposed to processed and ultra-processed. An example of this would be a whole apple (unprocessed), prepackaged apple slices with no additives (minimally processed), unsweetened applesauce (processed), and sweetened applesauce or apple juice with high fructose corn syrup (ultra-processed). For further information please visit [email or web address]. Creation of balanced and diverse meals to increase the intake of nutrient-rich foods that provide essential vitamins, minerals, fiber, and phytonutrients  Variety of Fruits and Vegetables:  Aim for a colorful array of fruits and vegetables to ensure a wide range of nutrients. Include a mix of leafy greens, berries, citrus fruits, cruciferous vegetables, and more. Whole Grains: Choose whole grains over refined grains. Examples include brown rice, quinoa, oats, whole wheat, and barley. Lean Proteins: Include lean sources of protein, such as poultry, fish, tofu, legumes, beans, lentils, and low-fat dairy products. Limit red and processed meats. Healthy Fats: Incorporate sources of healthy fats, including avocados, nuts, seeds, and olive oil. Limit saturated and trans fats found in fried and processed foods. Dairy or Dairy Alternatives: Choose low-fat or fat-free dairy products, or plant-based alternatives like almond or soy milk. Portion Control: Be mindful of portion sizes to avoid overeating. Pay attention to hunger and satisfaction cues. Limit Added Sugars: Minimize the consumption of sugary beverages, snacks, and desserts. Check food labels for added sugars and opt for natural sources of sweetness such as whole fruits. Hydration: Drink plenty of water throughout the day. Limit sugary drinks and excessive caffeine intake. Moderate Sodium Intake: Reduce the consumption of high-sodium foods. Use herbs and spices for flavor instead  of excessive salt. Meal Planning and Preparation: Plan and prepare meals ahead of time to make healthier choices more convenient. Include a mix of food groups in each meal. Limit Processed Foods: Minimize the intake of highly processed and packaged foods that are often high in added sugars, salt, and unhealthy fats. Regular Physical Activity: Combine a healthy diet with regular physical activity for overall well-being. Aim for at least 150 minutes of moderate-intensity aerobic exercise per week, along with strength training. Moderation and Balance: Enjoy treats and indulgent foods in moderation, emphasizing balance rather than strict restriction.  -get back to singing and find other hobbies for stress management   Handouts Provided Include  6 month handout Mindful meals sheet  Learning Style & Readiness for Change Teaching method utilized: Visual & Auditory  Demonstrated degree of understanding via: Teach Back  Readiness Level: Preparation Barriers to learning/adherence to lifestyle change: none identified  RD's Notes for Next Visit Assess adherence to pt chosen goals   MONITORING & EVALUATION Dietary intake, weekly physical activity, body weight.  Next Steps Patient is to follow-up in June

## 2022-11-25 ENCOUNTER — Other Ambulatory Visit (HOSPITAL_COMMUNITY): Payer: Self-pay

## 2022-11-25 NOTE — Telephone Encounter (Signed)
Received notification from [facility] regarding a prior authorization for [facility]. Authorization has been APPROVED from 11/19/2022 to 05/20/2023 (? - not notated in approval letter). Approval letter sent to scan center.  Per test claim, copay for 28 days supply is $692  Patient must fill through [place]: [phone]   Authorization # [id number]  Enrolled patient into Kevzara copay card ($15000 per year): BIN: [id number] PCN: [id number] Group: [id number] ID: [id number]  Patient can be scheduled for Kevzara new start  Knox Saliva, PharmD, MPH, BCPS, CPP Clinical Pharmacist (Rheumatology and Pulmonology)

## 2022-11-26 DIAGNOSIS — M79605 Pain in left leg: Secondary | ICD-10-CM | POA: Diagnosis not present

## 2022-11-26 DIAGNOSIS — M545 Low back pain, unspecified: Secondary | ICD-10-CM | POA: Diagnosis not present

## 2022-11-26 DIAGNOSIS — G4733 Obstructive sleep apnea (adult) (pediatric): Secondary | ICD-10-CM | POA: Diagnosis not present

## 2022-11-26 DIAGNOSIS — M5416 Radiculopathy, lumbar region: Secondary | ICD-10-CM | POA: Diagnosis not present

## 2022-11-26 DIAGNOSIS — J189 Pneumonia, unspecified organism: Secondary | ICD-10-CM | POA: Diagnosis not present

## 2022-11-26 NOTE — Telephone Encounter (Signed)
ATC to schedule new start [facility] appointment. LVM for patient to call us back.  Maryan Puls, PharmD PGY-1 [facility] Pharmacy Resident

## 2022-11-27 ENCOUNTER — Other Ambulatory Visit (HOSPITAL_COMMUNITY): Payer: Self-pay

## 2022-11-27 NOTE — Telephone Encounter (Signed)
Patient scheduled for Darcus Pester new start on 12/01/2022. Will use sample.  Knox Saliva, PharmD, MPH, BCPS, CPP Clinical Pharmacist (Rheumatology and Pulmonology)

## 2022-11-28 ENCOUNTER — Other Ambulatory Visit: Payer: Self-pay

## 2022-11-28 ENCOUNTER — Encounter: Payer: Self-pay | Admitting: Internal Medicine

## 2022-11-28 ENCOUNTER — Other Ambulatory Visit (HOSPITAL_COMMUNITY): Payer: Self-pay

## 2022-12-01 ENCOUNTER — Ambulatory Visit: Payer: Commercial Managed Care - PPO | Admitting: Pharmacist

## 2022-12-01 DIAGNOSIS — M79605 Pain in left leg: Secondary | ICD-10-CM | POA: Diagnosis not present

## 2022-12-01 DIAGNOSIS — M545 Low back pain, unspecified: Secondary | ICD-10-CM | POA: Diagnosis not present

## 2022-12-01 DIAGNOSIS — M5416 Radiculopathy, lumbar region: Secondary | ICD-10-CM | POA: Diagnosis not present

## 2022-12-02 ENCOUNTER — Ambulatory Visit: Payer: Commercial Managed Care - PPO | Attending: Internal Medicine | Admitting: Pharmacist

## 2022-12-02 ENCOUNTER — Other Ambulatory Visit (HOSPITAL_COMMUNITY): Payer: Self-pay

## 2022-12-02 ENCOUNTER — Other Ambulatory Visit: Payer: Self-pay

## 2022-12-02 DIAGNOSIS — M353 Polymyalgia rheumatica: Secondary | ICD-10-CM

## 2022-12-02 DIAGNOSIS — Z79899 Other long term (current) drug therapy: Secondary | ICD-10-CM

## 2022-12-02 DIAGNOSIS — Z7189 Other specified counseling: Secondary | ICD-10-CM

## 2022-12-02 MED ORDER — KEVZARA 200 MG/1.14ML ~~LOC~~ SOSY
200.0000 mg | PREFILLED_SYRINGE | SUBCUTANEOUS | 2 refills | Status: DC
  Filled 2022-12-02: qty 3.42, 42d supply, fill #0

## 2022-12-02 MED ORDER — KEVZARA 200 MG/1.14ML ~~LOC~~ SOAJ
200.0000 mg | SUBCUTANEOUS | 2 refills | Status: DC
  Filled 2022-12-02 – 2022-12-04 (×2): qty 2.28, fill #0
  Filled 2022-12-05: qty 2.28, 28d supply, fill #0
  Filled 2022-12-29: qty 2.28, 28d supply, fill #1
  Filled 2023-01-27: qty 2.28, 28d supply, fill #2

## 2022-12-02 MED ORDER — PREDNISONE 5 MG PO TABS
7.5000 mg | ORAL_TABLET | Freq: Every day | ORAL | 0 refills | Status: DC
  Filled 2022-12-02: qty 75, fill #0
  Filled 2022-12-04: qty 75, 60d supply, fill #0

## 2022-12-02 NOTE — Patient Instructions (Signed)
Your next [facility] dose is due on 12/16/22, 12/30/22, and every 14 days thereafter  REDUCE prednisone to 7.'5mg'$  once daily for 1 month, then reduce to '5mg'$  once daily until your follow-up with Dr. Georgina Pillion [facility] if you have signs or symptoms of an infection. You can resume once you feel better or back to your baseline. HOLD KEVZARA if you start antibiotics to treat an infection. HOLD KEVZARA around the time of surgery/procedures. Your surgeon will be able to provide recommendations on when to hold BEFORE and when you are cleared to [place].  Pharmacy information: Your prescription will be shipped from [facility]. Their phone number is [phone] [facility] will call you to schedule the first shipment. After this, the pharmacy will reach out to you monthly. They will mail your medication to your home.  Cost information: Your copay should be affordable. If you call the pharmacy and it is not affordable, please double-check that they are billing through your copay card as secondary coverage. That copay card information is: BIN: [id number] PCN: [id number] Group: [id number] ID: [id number]  Labs are due in 1 month then every 3 months. Lab hours are from Monday to Thursday 8am-12:30pm and 1pm-5pm and Friday 8am-12pm. You do not need an appointment if you come for labs during these times.  How to manage an injection site reaction: Remember the 5 C's: COUNTER - leave on the counter at least 30 minutes but up to overnight to bring medication to room temperature. This may help prevent stinging COLD - place something cold (like an ice gel pack or cold water bottle) on the injection site just before cleansing with alcohol. This may help reduce pain CLARITIN - use Claritin (generic name is loratadine) for the first two weeks of treatment or the day of, the day before, and the day after injecting. This will help to minimize injection site reactions CORTISONE CREAM - apply if injection site is  irritated and itching CALL ME - if injection site reaction is bigger than the size of your fist, looks infected, blisters, or if you develop hives

## 2022-12-02 NOTE — Progress Notes (Signed)
Pharmacy Note  Subjective:   Patient presents to clinic today to receive first dose of Kevzara for rheumatoid arthritis and PMR.  She currently takes prednisone '10mg'$  daily  Patient running a fever or have signs/symptoms of infection? No  Patient currently on antibiotics for the treatment of infection? No  Patient have any upcoming invasive procedures/surgeries? No  Objective: CMP     Component Value Date/Time   NA 143 11/17/2022 1129   K 4.8 11/17/2022 1129   CL 104 11/17/2022 1129   CO2 30 11/17/2022 1129   GLUCOSE 94 11/17/2022 1129   BUN 26 (H) 11/17/2022 1129   CREATININE 0.73 11/17/2022 1129   CALCIUM 10.0 11/17/2022 1129   PROT 6.4 11/17/2022 1129   ALBUMIN 3.6 08/16/2022 1632   AST 16 11/17/2022 1129   ALT 13 11/17/2022 1129   ALKPHOS 70 08/16/2022 1632   BILITOT 0.2 11/17/2022 1129   GFRNONAA >60 08/19/2022 0311   GFRAA >60 03/18/2020 1727    CBC    Component Value Date/Time   WBC 17.3 (H) 11/17/2022 1129   RBC 4.17 11/17/2022 1129   HGB 11.7 11/17/2022 1129   HCT 35.9 11/17/2022 1129   PLT 363 11/17/2022 1129   MCV 86.1 11/17/2022 1129   MCH 28.1 11/17/2022 1129   MCHC 32.6 11/17/2022 1129   RDW 13.8 11/17/2022 1129   LYMPHSABS 3,010 11/17/2022 1129   MONOABS 0.8 08/16/2022 1632   EOSABS 104 11/17/2022 1129   BASOSABS 52 11/17/2022 1129    Baseline Immunosuppressant Therapy Labs TB GOLD - negative skin test    HIV Lab Results  Component Value Date   HIV Non Reactive 03/13/2019   SPEP    Latest Ref Rng & Units 11/17/2022   11:29 AM  Serum Protein Electrophoresis  Total Protein 6.1 - 8.1 g/dL 6.4    Chest x-ray: 08/16/2022 - No active cardiopulmonary disease.  Assessment/Plan:  Reviewed importance of holding [place] with signs/symptoms of an infections, if antibiotics are prescribed to treat an active infection, and with invasive procedures  Demonstrated proper injection technique with KEVZARA pen demo device  Patient able to demonstrate  proper injection technique using the teach back method.  Patient self injected in the right lower abdomen with:  Sample Medication: Darcus Pester NDC: [id number] Lot: [id number] Expiration: 01/27/2024  Patient tolerated well.  Observed for 30 mins in office for adverse reaction and none noted. Patient denies itchiness and irritation   Patient is to return in 1 month for labs and 6-8 weeks for follow-up appointment.  Standing orders placed for CBC, CMP, and lipid panel  KEVZARA approved through insurance .   Rx sent to: [facility]: [phone] .  Patient provided with pharmacy phone number and advised that [facility] will call to schedule shipment to home.  Patient will continue KEVZARA '200mg'$  subcut every 14 days. She will reduce prednisone to 7.'5mg'$  once daily x 1 month, then '5mg'$  once daily thereafter until follow-up with Dr. Benjamine Mola. New prednisone rx sent to [facility] today.  All questions encouraged and answered.  Instructed patient to call with any further questions or concerns.  Knox Saliva, PharmD, MPH, BCPS, CPP Clinical Pharmacist (Rheumatology and Pulmonology)  12/02/2022 8:44 AM

## 2022-12-02 NOTE — Addendum Note (Signed)
Addended by: Cassandria Anger on: 12/02/2022 11:05 AM   Modules accepted: Orders

## 2022-12-03 ENCOUNTER — Other Ambulatory Visit: Payer: Self-pay

## 2022-12-03 DIAGNOSIS — M545 Low back pain, unspecified: Secondary | ICD-10-CM | POA: Diagnosis not present

## 2022-12-03 DIAGNOSIS — M79605 Pain in left leg: Secondary | ICD-10-CM | POA: Diagnosis not present

## 2022-12-03 DIAGNOSIS — M5416 Radiculopathy, lumbar region: Secondary | ICD-10-CM | POA: Diagnosis not present

## 2022-12-04 ENCOUNTER — Other Ambulatory Visit: Payer: Self-pay | Admitting: Orthopaedic Surgery

## 2022-12-04 ENCOUNTER — Other Ambulatory Visit: Payer: Self-pay

## 2022-12-04 ENCOUNTER — Other Ambulatory Visit (HOSPITAL_COMMUNITY): Payer: Self-pay

## 2022-12-04 MED ORDER — GABAPENTIN 100 MG PO CAPS
ORAL_CAPSULE | ORAL | 0 refills | Status: DC
  Filled 2022-12-04: qty 90, 30d supply, fill #0

## 2022-12-04 MED ORDER — TRAMADOL HCL 50 MG PO TABS
50.0000 mg | ORAL_TABLET | Freq: Two times a day (BID) | ORAL | 0 refills | Status: DC | PRN
  Filled 2022-12-04: qty 30, 15d supply, fill #0

## 2022-12-05 ENCOUNTER — Other Ambulatory Visit: Payer: Self-pay

## 2022-12-05 ENCOUNTER — Other Ambulatory Visit (HOSPITAL_COMMUNITY): Payer: Self-pay

## 2022-12-05 DIAGNOSIS — G4733 Obstructive sleep apnea (adult) (pediatric): Secondary | ICD-10-CM | POA: Diagnosis not present

## 2022-12-05 MED ORDER — FLECAINIDE ACETATE 100 MG PO TABS
100.0000 mg | ORAL_TABLET | Freq: Two times a day (BID) | ORAL | 1 refills | Status: DC
  Filled 2022-12-05: qty 180, 90d supply, fill #0
  Filled 2023-06-29: qty 180, 90d supply, fill #1

## 2022-12-08 ENCOUNTER — Other Ambulatory Visit (HOSPITAL_COMMUNITY): Payer: Self-pay

## 2022-12-10 ENCOUNTER — Other Ambulatory Visit: Payer: Self-pay

## 2022-12-10 ENCOUNTER — Encounter: Payer: Commercial Managed Care - PPO | Admitting: Internal Medicine

## 2022-12-11 ENCOUNTER — Other Ambulatory Visit: Payer: Self-pay

## 2022-12-12 ENCOUNTER — Other Ambulatory Visit (HOSPITAL_COMMUNITY): Payer: Self-pay

## 2022-12-12 DIAGNOSIS — G4733 Obstructive sleep apnea (adult) (pediatric): Secondary | ICD-10-CM | POA: Diagnosis not present

## 2022-12-16 ENCOUNTER — Ambulatory Visit: Payer: Commercial Managed Care - PPO | Admitting: Orthopaedic Surgery

## 2022-12-16 ENCOUNTER — Encounter: Payer: Self-pay | Admitting: Orthopaedic Surgery

## 2022-12-16 VITALS — BP 119/79 | Ht 65.0 in | Wt 292.0 lb

## 2022-12-16 DIAGNOSIS — M5126 Other intervertebral disc displacement, lumbar region: Secondary | ICD-10-CM

## 2022-12-17 ENCOUNTER — Other Ambulatory Visit (HOSPITAL_COMMUNITY): Payer: Self-pay

## 2022-12-17 ENCOUNTER — Other Ambulatory Visit: Payer: Self-pay | Admitting: Internal Medicine

## 2022-12-17 ENCOUNTER — Other Ambulatory Visit: Payer: Self-pay

## 2022-12-17 MED ORDER — DULOXETINE HCL 60 MG PO CPEP
60.0000 mg | ORAL_CAPSULE | Freq: Every day | ORAL | 0 refills | Status: DC
  Filled 2022-12-17: qty 30, 30d supply, fill #0

## 2022-12-17 NOTE — Progress Notes (Signed)
Office Visit Note   Patient: Monique Clark           Date of Birth: 17-Nov-1965           MRN: [id number] Visit Date: 12/16/2022              Requested by: Hoyt Koch, MD [address] [place],  [place] [address] PCP: Hoyt Koch, MD   Assessment & Plan: Visit Diagnoses:  1. Protrusion of lumbar intervertebral disc     Plan: Reviewed MRI again with patient.  Would recommend lumbar myelogram CT scan to better visualize exactly where the nerve root is compressed.  Patient is interested in surgery and I discussed with her that it is best to have an exact plan to determine where the red disc material is causing pressure.  Office follow-up after lumbar myelogram CT scan.  Evaluation of her left L4-5 extruded fragment caudally.  Follow-Up Instructions: No follow-ups on file.   Orders:  Orders Placed This Encounter  Procedures   DG MYELOGRAPHY LUMBAR INJ LUMBOSACRAL   CT LUMBAR SPINE W CONTRAST   DG Myelogram Lumbar   No orders of the defined types were placed in this encounter.     Procedures: No procedures performed   Clinical Data: No additional findings.   Subjective: Chief Complaint  Patient presents with   Lower Back - Pain, Follow-up    HPI 57 year old female with BMI 48 here with persistent ongoing back and left leg pain x 3 months.  Previous surgery by me 2022 right L4-5 disc protrusion which did well up until she developed back and left leg pain.  MRI 07/14/2022 showed left paracentral herniation not well-visualized and possibly with extruded fragment either down from L4-5 level versus less likely up from L5-S1 level.  Seen on axial images not visualized on sagittal images.  Has history of non-Hodgkin's lymphoma and previous obesity surgery.  She has had persistent pain states her leg is weaker she is having some weakness with ankle dorsiflexion on the left no problems with the right and has had epidurals in the past that were not effective.   No instability on lateral lumbar flexion-extension radiographs.  Review of Systems all other systems updated unchanged.   Objective: Vital Signs: BP 119/79   Ht 5\' 5"  (1.651 m)   Wt 292 lb (132.5 kg)   BMI 48.59 kg/m   Physical Exam Constitutional:      Appearance: She is well-developed.  HENT:     Head: Normocephalic.     Right Ear: External ear normal.     Left Ear: External ear normal. There is no impacted cerumen.  Eyes:     Pupils: Pupils are equal, round, and reactive to light.  Neck:     Thyroid: No thyromegaly.     Trachea: No tracheal deviation.  Cardiovascular:     Rate and Rhythm: Normal rate.  Pulmonary:     Effort: Pulmonary effort is normal.  Abdominal:     Palpations: Abdomen is soft.  Musculoskeletal:     Cervical back: No rigidity.  Skin:    General: Skin is warm and dry.  Neurological:     Mental Status: She is alert and oriented to person, place, and time.  Psychiatric:        Behavior: Behavior normal.     Ortho Exam patient has sciatic notch tenderness on the left negative on the right well-healed lumbar incision.  1 grade ankle dorsiflexion weakness anterior tib EHL on  the left normal on the right.  Some pain with straight leg raising 70 degrees on the left -90 degrees on the right.  Gastrocsoleus is strong and symmetrical.  Specialty Comments:  MRI LUMBAR SPINE WITHOUT AND WITH CONTRAST   TECHNIQUE: Multiplanar and multiecho pulse sequences of the lumbar spine were obtained without and with intravenous contrast.   CONTRAST:  10 cc of UA intravenous   COMPARISON:  06/14/2021   FINDINGS: Segmentation:  Standard.   Alignment:  Physiologic.   Vertebrae: No fracture, evidence of discitis, or aggressive bone lesion.   Conus medullaris and cauda equina: Conus extends to the L1-2 level. Conus and cauda equina appear normal.   Paraspinal and other soft tissues: Atrophy of intrinsic back muscles, especially at the thoracolumbar junction  and at the level of prior surgery where there is posterior scarring.   Disc levels:   L1-L2: Mild spondylosis   L2-L3: Disc narrowing and bulging with endplate and facet spurring. Small right foraminal protrusion. No neural impingement   L3-L4: Disc narrowing and bulging with endplate and facet spurring. Mild spinal and bilateral foraminal stenosis. Asymmetric left subarticular recess narrowing that is also mild   L4-L5: Disc narrowing and bulging with endplate ridging. New left paracentral herniation compressing the left L5 nerve root. Improved right-sided canal patency after right laminotomy, although there is isointense material at the right subarticular recess that is not clearly enhancing, some residual disc and associated stenosis. Overall moderate spinal stenosis. Facet spurring and disc bulging causes moderate foraminal narrowing on the right.   L5-S1:Disc narrowing and bulging with degenerative facet spurring. Bilateral subarticular recess stenosis that could affect the S1 nerve roots, stable.   IMPRESSION: 1. L4-5 left paracentral herniation impinging on the left L5 nerve root, progressed from 2022. 2. Interval L4-5 right canal decompression with improved patency, although some residual disc and stenosis at the right subarticular recess. Moderate right foraminal narrowing at this level. 3. Stable noncompressive degeneration at the adjacent levels.     Electronically Signed   By: Jorje Guild M.D.   On: 07/14/2022 19:13  Imaging: No results found.   PMFS History: Patient Active Problem List   Diagnosis Date Noted   Seronegative rheumatoid arthritis ([place]) 11/17/2022   Nocturnal hypoxemia 08/29/2022   Pulmonary nodule 08/16/2022   Protrusion of lumbar intervertebral disc 07/25/2022   OSA on CPAP 05/19/2022   Impingement syndrome of left shoulder 05/13/2022   Morbid (severe) obesity due to excess calories ([place]) 04/29/2022   Leukocytosis 01/20/2022    Morbid obesity ([place]) 01/01/2022   Snoring 01/01/2022   Non-restorative sleep 01/01/2022   Chronic fatigue and immune dysfunction syndrome ([place]) 01/01/2022   High risk medication use 12/02/2021   Otitis externa 11/05/2021   PMR (polymyalgia rheumatica) ([place]) 11/05/2021   Insomnia 05/28/2021   Malignant (primary) neoplasm, unspecified ([place]) 03/08/2021   Personal history of urinary (tract) infections 03/08/2021   Personal history of pneumonia (recurrent) 03/08/2021   Elevated serum hCG 07/02/2020   POTS (postural orthostatic tachycardia syndrome) 03/08/2020   Intermittent claudication ([place]) 01/04/2020   Mixed hyperlipidemia 03/21/2019   Palpitations 03/21/2019   Paroxysmal atrial fibrillation (HCC)    Localized swelling of left lower extremity    Lightheadedness    Essential hypertension    Cellulitis 06/22/2017   Mitral valve regurgitation 06/22/2017   CVID (common variable immunodeficiency) ([place]) 05/07/2015   Prediabetes 01/20/2013   History of non-Hodgkin's lymphoma 12/21/2012   Low back pain radiating to right lower extremity 12/10/2012   Mononucleosis  12/08/1983   Past Medical History:  Diagnosis Date   Abscess    Arthritis    Atrial fibrillation ([place])    Cardiac arrhythmia due to congenital heart disease    trace MR 08/15/20 echo ([place])   CVID (common variable immunodeficiency) ([place]) 2015   Diverticulosis    Elevated cholesterol    GERD (gastroesophageal reflux disease)    High blood pressure    Non Hodgkin's lymphoma ([place]) 1998   Obesity    Personal history of gestational diabetes    PMR (polymyalgia rheumatica) (HCC)    Pneumonia    PONV (postoperative nausea and vomiting)    POTS (postural orthostatic tachycardia syndrome)    Pre-diabetes    UTI (urinary tract infection)     Family History  Problem Relation Age of Onset   Atrial fibrillation Mother    Cancer Father        Colon   Colon cancer Sister    Basal cell carcinoma Brother    Atrial  fibrillation Maternal Uncle    Atrial fibrillation Maternal Grandmother    Atrial fibrillation Maternal Grandfather    Heart attack Maternal Grandfather    Ovarian cancer Neg Hx     Past Surgical History:  Procedure Laterality Date   ABDOMINAL HYSTERECTOMY     APPENDECTOMY     CESAREAN SECTION     GALLBLADDER SURGERY     JOINT REPLACEMENT Left    hip   JOINT REPLACEMENT Right    Right Hip 2022   LAPAROSCOPIC GASTRIC SLEEVE RESECTION  04/29/2022   left shoulder repair     LUMBAR LAMINECTOMY N/A 09/18/2021   Procedure: RIGHT LUMBAR FOUR-FIVE MICRODISCECTOMY;  Surgeon: Marybelle Killings, MD;  Location: [facility];  Service: Orthopedics;  Laterality: N/A;   LUNG REMOVAL, PARTIAL Right 2009   VATS, wedge resection partial lobectomy, done at [facility] Bilateral    SPLENECTOMY, TOTAL  1998   TONSILLECTOMY AND ADENOIDECTOMY     UPPER GI ENDOSCOPY N/A 04/29/2022   Procedure: UPPER GI ENDOSCOPY;  Surgeon: Kieth Brightly, Arta Bruce, MD;  Location: [facility];  Service: General;  Laterality: N/A;   Social History   Occupational History   Occupation: rn  Tobacco Use   Smoking status: Former    Packs/day: 0.50    Years: 20.00    Additional pack years: 0.00    Total pack years: 10.00    Types: Cigarettes    Quit date: 09/29/2002    Years since quitting: 20.2    Passive exposure: Current   Smokeless tobacco: Never  Vaping Use   Vaping Use: Never used  Substance and Sexual Activity   Alcohol use: No   Drug use: No   Sexual activity: Yes    Birth control/protection: Surgical, Post-menopausal    Comment: 1st intercourse 76 yo-5 partners

## 2022-12-18 ENCOUNTER — Other Ambulatory Visit (HOSPITAL_COMMUNITY): Payer: Self-pay

## 2022-12-21 ENCOUNTER — Encounter (HOSPITAL_COMMUNITY): Payer: Self-pay

## 2022-12-21 ENCOUNTER — Other Ambulatory Visit: Payer: Self-pay

## 2022-12-21 ENCOUNTER — Emergency Department (HOSPITAL_COMMUNITY)
Admission: EM | Admit: 2022-12-21 | Discharge: 2022-12-21 | Disposition: A | Discharge status: Home / Self Care | Payer: Commercial Managed Care - PPO | Attending: Emergency Medicine | Admitting: Emergency Medicine

## 2022-12-21 DIAGNOSIS — Z87891 Personal history of nicotine dependence: Secondary | ICD-10-CM | POA: Insufficient documentation

## 2022-12-21 DIAGNOSIS — G8929 Other chronic pain: Secondary | ICD-10-CM | POA: Diagnosis not present

## 2022-12-21 DIAGNOSIS — M5442 Lumbago with sciatica, left side: Secondary | ICD-10-CM | POA: Insufficient documentation

## 2022-12-21 DIAGNOSIS — Z7901 Long term (current) use of anticoagulants: Secondary | ICD-10-CM | POA: Insufficient documentation

## 2022-12-21 DIAGNOSIS — M545 Low back pain, unspecified: Secondary | ICD-10-CM | POA: Diagnosis present

## 2022-12-21 LAB — URINALYSIS, ROUTINE W REFLEX MICROSCOPIC
Bilirubin Urine: NEGATIVE
Glucose, UA: NEGATIVE mg/dL
Hgb urine dipstick: NEGATIVE
Ketones, ur: NEGATIVE mg/dL
Leukocytes,Ua: NEGATIVE
Nitrite: NEGATIVE
Protein, ur: NEGATIVE mg/dL
Specific Gravity, Urine: 1.003 — ABNORMAL LOW (ref 1.005–1.030)
pH: 6 (ref 5.0–8.0)

## 2022-12-21 MED ORDER — HYDROCODONE-ACETAMINOPHEN 5-325 MG PO TABS: 1.0000 | ORAL_TABLET | Freq: Four times a day (QID) | ORAL | 0 refills | Status: DC | PRN

## 2022-12-21 MED ORDER — HYDROMORPHONE HCL 1 MG/ML IJ SOLN
2.0000 mg | Freq: Once | INTRAMUSCULAR | Status: AC
  Administered 2022-12-21: 2 mg via INTRAMUSCULAR
  Filled 2022-12-21: qty 2

## 2022-12-21 NOTE — ED Triage Notes (Signed)
Pt presents acute on chronic L side back pain that radiates down L leg and around to L groin. Pt denies any injury.

## 2022-12-21 NOTE — Discharge Instructions (Signed)
Would recommend getting Dr. Lorin Mercy office a call tomorrow.  Take the hydrocodone as needed.  You can take it along with your tramadol.  Prepack provided.  Return for fevers return for any new or worse symptoms or either follow-up with Dr. Lorin Mercy for MRI.

## 2022-12-21 NOTE — ED Notes (Signed)
All discharge information given and explained to pt. Pt verbalized understanding, prescription given to pt to carry to her choice of pharmacy.

## 2022-12-21 NOTE — ED Provider Notes (Addendum)
[facility] Provider Note   CSN: [id number] Arrival date & time: 12/21/22  1644     History  Chief Complaint  Patient presents with   Back Pain    Monique Clark is a 57 y.o. female.  Patient followed by Dr. Lorin Mercy orthopedics for chronic left-sided sciatic pain.  Some new increased pain in the last few days.  That can and goes into the left groin area.  Patient has not had that before.  Denies any fevers any nausea or vomiting.  Still has the numbness into the left foot.  Dr. Lorin Mercy is planning on doing a CT myelogram.  The trying to go isolate nerve root impingement but they are thinking itself for L5 sciatica.  Last seen by Dr. Lorin Mercy on March 19.  Has not had any recent injections.  Last was injected about 2 months ago.  Past medical history significant for non-Hodgkin's lymphoma elevated cholesterol high blood pressure obesity gastroesophageal reflux disease and polymyalgia rheumatica.  Patient former smoker quit January 2004.  She is taking tramadol for pain has not run out of it.  But not helping much.  Pain is made worse with movement.       Home Medications Prior to Admission medications   Medication Sig Start Date End Date Taking? Authorizing Provider  acetaminophen (TYLENOL) 325 MG tablet Take 650 mg by mouth every 6 (six) hours as needed for moderate pain.    [provider]  albuterol (VENTOLIN HFA) 108 (90 Base) MCG/ACT inhaler Inhale 1 puff into the lungs every 6 (six) hours as needed for shortness of breath or wheezing. 08/14/21   [provider]  calcium carbonate (TUMS) 500 MG chewable tablet Chew 1 tablet by mouth 3 (three) times daily.    [provider]  cetirizine (ZYRTEC) 10 MG tablet Take 1 tablet (10 mg total) by mouth daily. Patient not taking: Reported on 11/11/2022 12/20/21   Chaney Malling, PA  Cholecalciferol (VITAMIN D3) 1.25 MG (50000 UT) capsule Take 1 capsule (1.25 mg total) by  mouth 2 (two) times a week as directed 10/27/22   Hoyt Koch, MD  diphenhydrAMINE (BENADRYL) 25 MG tablet Take 25-50 mg by mouth See admin instructions. Take 25 mg by mouth as needed for allergies or itching and take 50 mg by mouth with Hizentra infusion    [provider]  docusate sodium (COLACE) 50 MG capsule Take 100 mg by mouth at bedtime.    [provider]  DULoxetine (CYMBALTA) 60 MG capsule Take 1 capsule (60 mg total) by mouth daily. Annual appt due in April must see provider for future refills 12/17/22   Hoyt Koch, MD  famotidine (PEPCID) 20 MG tablet Take 20 mg by mouth every evening.    [provider]  flecainide (TAMBOCOR) 100 MG tablet Take 1 tablet (100 mg total) by mouth every 12 (twelve) hours. 12/05/22     fluticasone (FLONASE) 50 MCG/ACT nasal spray Place 2 sprays into both nostrils daily as needed for allergies. Patient not taking: Reported on 11/17/2022    [provider]  gabapentin (NEURONTIN) 100 MG capsule Take 1 capsule by mouth every night, may increase to 1 capsule by mouth 3 times a day if needed. 12/04/22   Marybelle Killings, MD  ibuprofen (ADVIL) 200 MG tablet Take 600 mg by mouth every 6 (six) hours as needed for mild pain or moderate pain.    [provider]  Immune Globulin,  Human, (HIZENTRA) 4 GM/20ML SOLN 36 grams (19ml) subcutaneously every 2 weeks via pump 11/17/22     Immune Globulin, Human, 4 GM/20ML SOLN Inject 36 g into the skin every 14 (fourteen) days. *Tuesday 05/03/15   [provider]  Multiple Vitamin (MULTIVITAMIN WITH MINERALS) TABS tablet Take 1 tablet by mouth daily.    [provider]  nebivolol (BYSTOLIC) 5 MG tablet Take 1 tablet (5 mg total) by mouth daily. 08/25/22     NON FORMULARY Pt uses a cpap nightly    [provider]  predniSONE (DELTASONE) 5 MG tablet Take 1.5 tablets (7.5 mg total) by mouth daily for 1 month, then take 1 tablet by mouth daily thereafter  12/02/22   Collier Salina, MD  rivaroxaban (XARELTO) 20 MG TABS tablet Take 1 tablet by mouth every day Patient taking differently: Take 20 mg by mouth every evening. 04/02/22     Sarilumab (KEVZARA) 200 MG/1.14ML SOAJ Inject 200 mg into the skin every 14 (fourteen) days. 12/02/22   Rice, Resa Miner, MD  traMADol (ULTRAM) 50 MG tablet Take 1 tablet (50 mg total) by mouth every 12 (twelve) hours as needed. 12/04/22   Marybelle Killings, MD  zolpidem (AMBIEN) 5 MG tablet Take 1 tablet (5 mg total) by mouth at bedtime as needed for sleep. 10/03/22   Hoyt Koch, MD      Allergies    Chlorhexidine, Tetanus-diphtheria toxoids td, Nizatidine, Promethazine, and Tetanus toxoids    Review of Systems   Review of Systems  Constitutional:  Negative for chills and fever.  HENT:  Negative for ear pain and sore throat.   Eyes:  Negative for pain and visual disturbance.  Respiratory:  Negative for cough and shortness of breath.   Cardiovascular:  Negative for chest pain and palpitations.  Gastrointestinal:  Negative for abdominal pain and vomiting.  Genitourinary:  Negative for dysuria and hematuria.  Musculoskeletal:  Positive for back pain. Negative for arthralgias.  Skin:  Negative for color change and rash.  Neurological:  Positive for numbness. Negative for seizures and syncope.  All other systems reviewed and are negative.   Physical Exam Updated Vital Signs BP (!) 154/92 (BP Location: Right Wrist)   Pulse 63   Temp 98.4 F (36.9 C)   Resp 20   Ht 1.626 m (5\' 4" )   Wt 131.5 kg   SpO2 97%   BMI 49.78 kg/m  Physical Exam Vitals and nursing note reviewed.  Constitutional:      General: She is not in acute distress.    Appearance: Normal appearance. She is well-developed.  HENT:     Head: Normocephalic and atraumatic.  Eyes:     Extraocular Movements: Extraocular movements intact.     Conjunctiva/sclera: Conjunctivae normal.     Pupils: Pupils are equal, round, and reactive to  light.  Cardiovascular:     Rate and Rhythm: Normal rate and regular rhythm.     Heart sounds: No murmur heard. Pulmonary:     Effort: Pulmonary effort is normal. No respiratory distress.     Breath sounds: Normal breath sounds.  Abdominal:     Palpations: Abdomen is soft.     Tenderness: There is no abdominal tenderness.  Musculoskeletal:        General: No swelling or tenderness.     Cervical back: Normal range of motion and neck supple.  Skin:    General: Skin is warm and dry.     Capillary Refill: Capillary refill  takes less than 2 seconds.  Neurological:     Mental Status: She is alert.     Sensory: Sensory deficit present.  Psychiatric:        Mood and Affect: Mood normal.     ED Results / Procedures / Treatments   Labs (all labs ordered are listed, but only abnormal results are displayed) Labs Reviewed  URINALYSIS, ROUTINE W REFLEX MICROSCOPIC - Abnormal; Notable for the following components:      Result Value   Color, Urine COLORLESS (*)    Specific Gravity, Urine 1.003 (*)    All other components within normal limits    EKG None  Radiology No results found.  Procedures Procedures    Medications Ordered in ED Medications  HYDROmorphone (DILAUDID) injection 2 mg (2 mg Intramuscular Given 12/21/22 1738)    ED Course/ Medical Decision Making/ A&P                             Medical Decision Making Amount and/or Complexity of Data Reviewed Labs: ordered.  Risk Prescription drug management.   Clinically patient with known chronic left-sided sciatic pain L for L5 being followed by Dr. Lorin Mercy.  This pain has some radiation into the left groin area.  So it could be a disc level above.  Patient raise concerns about discitis she says she is never had that before.  That would require MRI to diagnose there have not been any fevers.  This new pains only been a few days.  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  Patient's urinalysis negative for any concerns for urinary tract infection.  Clinically I think this is probably a continuation of her sciatica but may be with some femoral nerve involvement just a higher dose level.  Patient can follow-up with Dr. Lorin Mercy by phone call her in the office tomorrow.  Will go ahead and give IM hydromorphone.  Will give her a prepack of hydrocodone to take in the meantime.  Clinically no evidence of cauda equina.  No evidence of acute intra-abdominal process.   Final Clinical Impression(s) / ED Diagnoses Final diagnoses:  Chronic left-sided low back pain with left-sided sciatica    Rx / DC Orders ED Discharge Orders     None         Fredia Sorrow, MD 12/21/22 1740    Fredia Sorrow, MD 12/21/22 [phone]

## 2022-12-22 ENCOUNTER — Telehealth: Payer: Self-pay

## 2022-12-22 ENCOUNTER — Telehealth: Payer: Self-pay | Admitting: Orthopaedic Surgery

## 2022-12-22 NOTE — Telephone Encounter (Signed)
I called patient. She was seen in ED yesterday and states that she is unsure if something else is going on, but she is having a really difficult time.  She asked about the myelogram, and per chart, it looks like it has been authorized and is ready for scheduling. I advised that she could call [facility] to schedule. Tramadol is no longer helping. The ED gave her hydrocodone #6 until she followed up here. Could you please send in hydrocodone to get patient through until she is seen for follow up? She uses [facility].

## 2022-12-22 NOTE — Telephone Encounter (Signed)
Patient requesting a call from [place]..[phone]

## 2022-12-22 NOTE — Transitions of Care (Post Inpatient/ED Visit) (Signed)
   12/22/2022  Name: Lavelle Shelby MRN: [id number] DOB: 1966/09/05  Today's TOC FU Call Status: Today's TOC FU Call Status:: Successful TOC FU Call Competed TOC FU Call Complete Date: 12/22/22  Transition Care Management Follow-up Telephone Call Date of Discharge: 12/21/22 Discharge [facility] Type of Discharge: Emergency Department Reason for ED Visit: Other: (lumbago) How have you been since you were released from the hospital?: Same Any questions or concerns?: No  Items Reviewed: Did you receive and understand the discharge instructions provided?: Yes Medications obtained and verified?: Yes (Medications Reviewed) Any new allergies since your discharge?: No Dietary orders reviewed?: NA Do you have support at home?: Yes People in Home: spouse  Home Care and Equipment/Supplies: [place] Ordered?: NA Any new equipment or medical supplies ordered?: NA  Functional Questionnaire: Do you need assistance with bathing/showering or dressing?: No Do you need assistance with meal preparation?: No Do you need assistance with eating?: No Do you have difficulty maintaining continence: No Do you need assistance with getting out of bed/getting out of a chair/moving?: No Do you have difficulty managing or taking your medications?: No  Follow up appointments reviewed: PCP Follow-up appointment confirmed?: [facility] Follow-up appointment confirmed?: No Reason Specialist Follow-Up Not Confirmed: Patient has Specialist Provider Number and will Call for Appointment Do you need transportation to your follow-up appointment?: No Do you understand care options if your condition(s) worsen?: Yes-patient verbalized understanding    [place], [place] Nurse Health Advisor Direct Dial [phone]

## 2022-12-23 ENCOUNTER — Other Ambulatory Visit: Payer: Self-pay | Admitting: Orthopaedic Surgery

## 2022-12-23 ENCOUNTER — Encounter: Payer: Self-pay | Admitting: Internal Medicine

## 2022-12-23 ENCOUNTER — Other Ambulatory Visit: Payer: Self-pay

## 2022-12-23 ENCOUNTER — Other Ambulatory Visit (HOSPITAL_COMMUNITY): Payer: Self-pay

## 2022-12-23 ENCOUNTER — Ambulatory Visit (AMBULATORY_SURGERY_CENTER): Payer: Commercial Managed Care - PPO | Admitting: Internal Medicine

## 2022-12-23 VITALS — BP 132/47 | HR 53 | Temp 97.3°F | Resp 13 | Ht 65.0 in | Wt 288.0 lb

## 2022-12-23 DIAGNOSIS — Z800 Family history of malignant neoplasm of digestive organs: Secondary | ICD-10-CM

## 2022-12-23 DIAGNOSIS — E669 Obesity, unspecified: Secondary | ICD-10-CM | POA: Diagnosis not present

## 2022-12-23 DIAGNOSIS — I4891 Unspecified atrial fibrillation: Secondary | ICD-10-CM | POA: Diagnosis not present

## 2022-12-23 DIAGNOSIS — Z1211 Encounter for screening for malignant neoplasm of colon: Secondary | ICD-10-CM | POA: Diagnosis not present

## 2022-12-23 DIAGNOSIS — Z86010 Personal history of colonic polyps: Secondary | ICD-10-CM

## 2022-12-23 DIAGNOSIS — Z09 Encounter for follow-up examination after completed treatment for conditions other than malignant neoplasm: Secondary | ICD-10-CM

## 2022-12-23 DIAGNOSIS — K5904 Chronic idiopathic constipation: Secondary | ICD-10-CM

## 2022-12-23 DIAGNOSIS — E7800 Pure hypercholesterolemia, unspecified: Secondary | ICD-10-CM | POA: Diagnosis not present

## 2022-12-23 MED ORDER — SODIUM CHLORIDE 0.9 % IV SOLN: 500.0000 mL | INTRAVENOUS | Status: DC

## 2022-12-23 MED ORDER — HYDROCODONE-ACETAMINOPHEN 5-325 MG PO TABS
1.0000 | ORAL_TABLET | Freq: Four times a day (QID) | ORAL | 0 refills | Status: DC | PRN
  Filled 2022-12-23: qty 20, 5d supply, fill #0

## 2022-12-23 NOTE — Patient Instructions (Addendum)
  Handouts provided on Hemorrhoids and Diverticulosis  YOU HAD AN ENDOSCOPIC PROCEDURE TODAY AT [facility]:   Refer to the procedure report that was given to you for any specific questions about what was found during the examination.  If the procedure report does not answer your questions, please call your gastroenterologist to clarify.  If you requested that your care partner not be given the details of your procedure findings, then the procedure report has been included in a sealed envelope for you to review at your convenience later.  YOU SHOULD EXPECT: Some feelings of bloating in the abdomen. Passage of more gas than usual.  Walking can help get rid of the air that was put into your GI tract during the procedure and reduce the bloating. If you had a lower endoscopy (such as a colonoscopy or flexible sigmoidoscopy) you may notice spotting of blood in your stool or on the toilet paper. If you underwent a bowel prep for your procedure, you may not have a normal bowel movement for a few days.  Please Note:  You might notice some irritation and congestion in your nose or some drainage.  This is from the oxygen used during your procedure.  There is no need for concern and it should clear up in a day or so.  SYMPTOMS TO REPORT IMMEDIATELY:  Following lower endoscopy (colonoscopy or flexible sigmoidoscopy):  Excessive amounts of blood in the stool  Significant tenderness or worsening of abdominal pains  Swelling of the abdomen that is new, acute  Fever of 100F or higher  For urgent or emergent issues, a gastroenterologist can be reached at any hour by calling [phone]. Do not use MyChart messaging for urgent concerns.    DIET:  We do recommend a small meal at first, but then you may proceed to your regular diet.  Drink plenty of fluids but you should avoid alcoholic beverages for 24 hours.  ACTIVITY:  You should plan to take it easy for the rest of today and you should NOT  DRIVE or use heavy machinery until tomorrow (because of the sedation medicines used during the test).    FOLLOW UP: Our staff will call the number listed on your records the next business day following your procedure.  We will call around 7:15- 8:00 am to check on you and address any questions or concerns that you may have regarding the information given to you following your procedure. If we do not reach you, we will leave a message.      SIGNATURES/CONFIDENTIALITY: You and/or your care partner have signed paperwork which will be entered into your electronic medical record.  These signatures attest to the fact that that the information above on your After Visit Summary has been reviewed and is understood.  Full responsibility of the confidentiality of this discharge information lies with you and/or your care-partner.

## 2022-12-23 NOTE — Op Note (Signed)
[facility] Patient Name: Monique Clark Procedure Date: 12/23/2022 11:21 AM MRN: [id number] Endoscopist: Docia Chuck. Henrene Pastor , MD, [id number] Age: 57 Referring MD:  Date of Birth: 05-Feb-1966 Gender: Female Account #: [id number] Procedure:                Colonoscopy Indications:              High risk colon cancer surveillance: Personal                            history of sessile serrated colon polyp (less than                            10 mm in size) with no dysplasia. Previous                            examination 2018. Also, family history of colon                            cancer in sister less than age 31. Medicines:                Monitored Anesthesia Care Procedure:                Pre-Anesthesia Assessment:                           - Prior to the procedure, a History and Physical                            was performed, and patient medications and                            allergies were reviewed. The patient's tolerance of                            previous anesthesia was also reviewed. The risks                            and benefits of the procedure and the sedation                            options and risks were discussed with the patient.                            All questions were answered, and informed consent                            was obtained. Prior Anticoagulants: The patient has                            taken Xarelto (rivaroxaban), last dose was 2 days                            prior to procedure. ASA Grade Assessment: III - A  patient with severe systemic disease. After                            reviewing the risks and benefits, the patient was                            deemed in satisfactory condition to undergo the                            procedure.                           After obtaining informed consent, the colonoscope                            was passed under direct vision. Throughout the                             procedure, the patient's blood pressure, pulse, and                            oxygen saturations were monitored continuously. The                            CF [id number] was introduced through the anus                            and advanced to the the cecum, identified by                            appendiceal orifice and ileocecal valve. The                            ileocecal valve, appendiceal orifice, and rectum                            were photographed. The quality of the bowel                            preparation was excellent. The colonoscopy was                            performed without difficulty. The patient tolerated                            the procedure well. The bowel preparation used was                            SUPREP via single dose instruction. Scope In: 11:35:59 AM Scope Out: 11:47:55 AM Scope Withdrawal Time: 0 hours 9 minutes 32 seconds  Total Procedure Duration: 0 hours 11 minutes 56 seconds  Findings:                 A few diverticula were found in the sigmoid colon.  Internal hemorrhoids were found during                            retroflexion. The hemorrhoids were small.                            Hypertrophic anal papilla present                           The exam was otherwise without abnormality on                            direct and retroflexion views. Complications:            No immediate complications. Estimated blood loss:                            None. Estimated Blood Loss:     Estimated blood loss: none. Impression:               - Diverticulosis in the sigmoid colon.                           - Internal hemorrhoids. Hypertrophic anal papilla                           - The examination was otherwise normal on direct                            and retroflexion views.                           - No specimens collected. Recommendation:           - Repeat colonoscopy in 5 years for  surveillance                            (family history).                           - Patient has a contact number available for                            emergencies. The signs and symptoms of potential                            delayed complications were discussed with the                            patient. Return to normal activities tomorrow.                            Written discharge instructions were provided to the                            patient.                           -  Resume previous diet.                           - Continue present medications. Docia Chuck. Henrene Pastor, MD 12/23/2022 11:58:49 AM This report has been signed electronically.

## 2022-12-23 NOTE — Progress Notes (Signed)
HISTORY OF PRESENT ILLNESS:   Monique Clark is a pleasant 56 y.o. female with a history of sessile serrated polyp on colonoscopy May 12, 2017.  She presents today regarding surveillance colonoscopy.  Since her last procedure she reports to me that her sister was diagnosed with metastatic colon cancer approximately 1 year ago.  Patient herself is also had a change in her medical history.  This includes developing atrial fibrillation about 3 years ago.  Currently on Xarelto.  Regular rate and rhythm on medication.  She is followed by cardiology in [facility].  She also underwent sleeve gastrectomy August 2023.  That went well.  She is lost 45 pounds.  She tells me that she was approved to come off her chronic anticoagulant for few days around her surgery.   Her GI review of systems is remarkable for constipation for which she takes Colace with good results.  No other issues.   Review of blood work from August 19, 2022 shows mild anemia with hemoglobin 11.2..  CT scan of the abdomen and pelvis August 18, 2022 revealed no acute findings.  She is status postcholecystectomy   REVIEW OF SYSTEMS:   All non-GI ROS negative unless otherwise stated in the HPI except for sinus allergy trouble, arthritis, back pain, fatigue, headaches, cramps, insomnia       Past Medical History:  Diagnosis Date   Abscess     Arthritis     Atrial fibrillation ([place])     Cardiac arrhythmia due to congenital heart disease      trace MR 08/15/20 echo ([facility])   CVID (common variable immunodeficiency) ([place]) 2015   Diverticulosis     Elevated cholesterol     GERD (gastroesophageal reflux disease)     High blood pressure     Non Hodgkin's lymphoma ([place]) 1998   Obesity     Personal history of gestational diabetes     PMR (polymyalgia rheumatica) ([facility])     Pneumonia     PONV (postoperative nausea and vomiting)     POTS (postural orthostatic tachycardia syndrome)     Pre-diabetes     UTI  (urinary tract infection)             Past Surgical History:  Procedure Laterality Date   ABDOMINAL HYSTERECTOMY       APPENDECTOMY       CESAREAN SECTION       GALLBLADDER SURGERY       JOINT REPLACEMENT Left      hip   JOINT REPLACEMENT Right      Right Hip 2022   LAPAROSCOPIC GASTRIC SLEEVE RESECTION   04/29/2022   left shoulder repair       LUMBAR LAMINECTOMY N/A 09/18/2021    Procedure: RIGHT LUMBAR FOUR-FIVE MICRODISCECTOMY;  Surgeon: Marybelle Killings, MD;  Location: [facility];  Service: Orthopedics;  Laterality: N/A;   LUNG REMOVAL, PARTIAL Right 2009    VATS, wedge resection partial lobectomy, done at [facility] Bilateral     SPLENECTOMY, TOTAL   1998   TONSILLECTOMY AND ADENOIDECTOMY       UPPER GI ENDOSCOPY N/A 04/29/2022    Procedure: UPPER GI ENDOSCOPY;  Surgeon: Kinsinger, Arta Bruce, MD;  Location: [facility];  Service: General;  Laterality: N/A;      Social History Marlou Castrellon  reports that she quit smoking about 20 years ago. Her smoking use included cigarettes. She has a 10.00 pack-year smoking history. She has never used smokeless tobacco. She  reports that she does not drink alcohol and does not use drugs.   family history includes Atrial fibrillation in her maternal grandfather, maternal grandmother, maternal uncle, and mother; Basal cell carcinoma in her brother; Cancer in her father; Colon cancer in her sister; Heart attack in her maternal grandfather.        Allergies  Allergen Reactions   Chlorhexidine Rash   Tetanus-Diphtheria Toxoids Td Swelling      angioedema   Nizatidine Hives      Axid   Promethazine Other (See Comments)      High doses causes confusion   Tetanus Toxoids Other (See Comments)      angioedema          PHYSICAL EXAMINATION: Vital signs: BP 126/74   Pulse 76   Wt 293 lb (132.9 kg)   BMI 47.29 kg/m   Constitutional: Pleasant, generally well-appearing, obese, no acute distress Psychiatric: alert and oriented  x3, cooperative Eyes: extraocular movements intact, anicteric, conjunctiva pink Mouth: oral pharynx moist, no lesions Neck: supple no lymphadenopathy Cardiovascular: heart regular rate and rhythm, no murmur Lungs: clear to auscultation bilaterally Abdomen: soft, nontender, nondistended, no obvious ascites, no peritoneal signs, normal bowel sounds, no organomegaly Rectal: Deferred until colonoscopy Extremities: no clubbing or cyanosis.  Trace lower extremity edema bilaterally Skin: no lesions on visible extremities Neuro: No focal deficits.  Cranial nerves intact   ASSESSMENT:   1.  Personal history of sessile serrated polyp 2018 2.  Family history of colon cancer in her sister 36.  Morbid obesity status post sleeve gastrectomy August 2023.  Current BMI 47.2 4.  General medical problems including history of atrial fibrillation on chronic Xarelto 5.  Functional constipation 6.  Status post cholecystectomy     PLAN:   1.  Schedule surveillance colonoscopy.  The patient is high risk given her BMI and the need to address chronic anticoagulation therapy.The nature of the procedure, as well as the risks, benefits, and alternatives were carefully and thoroughly reviewed with the patient. Ample time for discussion and questions allowed. The patient understood, was satisfied, and agreed to proceed. 2.  Hold Xarelto 2 days prior to the procedure.  Patient understands the pros and cons of interrupting anticoagulation therapy. 3.  Laxative of choice 4.  Ongoing general medical care with PCP       Recent H&P as above.  No interval change.  Now for colonoscopy.

## 2022-12-23 NOTE — Progress Notes (Signed)
A and O x3. Report to RN. Tolerated MAC anesthesia well. 

## 2022-12-23 NOTE — Telephone Encounter (Signed)
I called patient and advised. 

## 2022-12-24 ENCOUNTER — Telehealth: Payer: Self-pay

## 2022-12-24 ENCOUNTER — Other Ambulatory Visit (HOSPITAL_COMMUNITY): Payer: Self-pay

## 2022-12-24 NOTE — Telephone Encounter (Signed)
Attempted f/u call. No answer, left VM. 

## 2022-12-25 DIAGNOSIS — G4733 Obstructive sleep apnea (adult) (pediatric): Secondary | ICD-10-CM | POA: Diagnosis not present

## 2022-12-25 DIAGNOSIS — J189 Pneumonia, unspecified organism: Secondary | ICD-10-CM | POA: Diagnosis not present

## 2022-12-29 ENCOUNTER — Other Ambulatory Visit (HOSPITAL_COMMUNITY): Payer: Self-pay

## 2022-12-30 ENCOUNTER — Telehealth: Payer: Self-pay | Admitting: Orthopaedic Surgery

## 2022-12-30 ENCOUNTER — Ambulatory Visit
Admission: RE | Admit: 2022-12-30 | Discharge: 2022-12-30 | Disposition: A | Discharge status: Home / Self Care | Payer: Commercial Managed Care - PPO | Source: Ambulatory Visit | Attending: Orthopaedic Surgery | Admitting: Orthopaedic Surgery

## 2022-12-30 ENCOUNTER — Other Ambulatory Visit: Payer: Self-pay

## 2022-12-30 DIAGNOSIS — M5126 Other intervertebral disc displacement, lumbar region: Secondary | ICD-10-CM

## 2022-12-30 DIAGNOSIS — M48061 Spinal stenosis, lumbar region without neurogenic claudication: Secondary | ICD-10-CM | POA: Diagnosis not present

## 2022-12-30 DIAGNOSIS — M545 Low back pain, unspecified: Secondary | ICD-10-CM | POA: Diagnosis not present

## 2022-12-30 MED ORDER — MEPERIDINE HCL 50 MG/ML IJ SOLN: 50.0000 mg | Freq: Once | INTRAMUSCULAR | Status: DC | PRN

## 2022-12-30 MED ORDER — ONDANSETRON HCL 4 MG/2ML IJ SOLN: 4.0000 mg | Freq: Once | INTRAMUSCULAR | Status: DC | PRN

## 2022-12-30 MED ORDER — IOPAMIDOL (ISOVUE-M 200) INJECTION 41%
20.0000 mL | Freq: Once | INTRAMUSCULAR | Status: AC
  Administered 2022-12-30: 20 mL via INTRATHECAL

## 2022-12-30 MED ORDER — DIAZEPAM 5 MG PO TABS
10.0000 mg | ORAL_TABLET | Freq: Once | ORAL | Status: AC
  Administered 2022-12-30: 10 mg via ORAL

## 2022-12-30 NOTE — Telephone Encounter (Signed)
Results not available yet.

## 2022-12-30 NOTE — Telephone Encounter (Signed)
Called pt 1X and left vm for pt to call and set an MRI review appt after 4/2 [facility]

## 2022-12-30 NOTE — Telephone Encounter (Signed)
Patient called asked if she could get a call with her Myelogram results? The number to contact patient is [phone]

## 2022-12-30 NOTE — Discharge Instructions (Signed)

## 2022-12-30 NOTE — Telephone Encounter (Signed)
Please advise. Report available under Imaging tab.

## 2023-01-01 ENCOUNTER — Encounter: Payer: Self-pay | Admitting: Orthopaedic Surgery

## 2023-01-01 DIAGNOSIS — Z7901 Long term (current) use of anticoagulants: Secondary | ICD-10-CM | POA: Diagnosis not present

## 2023-01-01 DIAGNOSIS — D801 Nonfamilial hypogammaglobulinemia: Secondary | ICD-10-CM | POA: Diagnosis not present

## 2023-01-01 DIAGNOSIS — I10 Essential (primary) hypertension: Secondary | ICD-10-CM | POA: Diagnosis not present

## 2023-01-01 DIAGNOSIS — Z79899 Other long term (current) drug therapy: Secondary | ICD-10-CM | POA: Diagnosis not present

## 2023-01-01 NOTE — Telephone Encounter (Signed)
noted 

## 2023-01-02 ENCOUNTER — Other Ambulatory Visit: Payer: Self-pay | Admitting: Physician Assistant

## 2023-01-02 ENCOUNTER — Ambulatory Visit: Payer: Commercial Managed Care - PPO | Admitting: Orthopaedic Surgery

## 2023-01-02 VITALS — BP 134/82 | HR 56 | Ht 65.0 in | Wt 288.0 lb

## 2023-01-02 DIAGNOSIS — M5126 Other intervertebral disc displacement, lumbar region: Secondary | ICD-10-CM

## 2023-01-02 NOTE — Progress Notes (Signed)
Office Visit Note   Patient: Monique Clark           Date of Birth: [birth year]           MRN: [id number] Visit Date: 01/02/2023              Requested by: Myrlene Broker, MD [address] [place],  [place] [address] PCP: Myrlene Broker, MD   Assessment & Plan: Visit Diagnoses:  1. Recurrent herniation of lumbar disc     Plan: Reviewed previous MRI scan and myelogram CT scan with patient.  Plan microdiscectomy for recurrent disc herniation at the L4-5 level.  Discussed with her that bilateral microdiscectomy may be required.  Procedure discussed risks of recurrent disc herniation, instability, need for possible fusion which would require her to have significant weight loss to be a candidate for this.  Risks of infection discussed.  Questions were elicited and answered she understands request to proceed.  Follow-Up Instructions: No follow-ups on file.   Orders:  No orders of the defined types were placed in this encounter.  No orders of the defined types were placed in this encounter.     Procedures: No procedures performed   Clinical Data: No additional findings.   Subjective: Chief Complaint  Patient presents with   Lower Back - Pain, Follow-up    CT/Myelogram review    HPI 57 year old female returns with ongoing significant back pain and left leg pain down to the dorsum of her foot with some ankle dorsiflexion weakness.  Patient had previous surgery by me 2 years ago for right L4-5 microdiscectomy.  She has had an MRI scan that showed left paracentral herniation with compression and she states since that time she is gradually gotten worse.  She has had ongoing symptoms and has trouble sitting problems standing cannot stand long cannot walk far.  Due to ongoing problems postmyelogram CT scan has been performed which shows moderate stenosis with compression at L4-5 consistent with compression on both sides.  No instability was noted on myelogram CT scan.   No areas of compression at other levels she did have a little bit of disc bulge at L3-4 and does have some facet arthropathy at L3-4, L4-5 and L5-S1.  She does not have instability and returns today to discuss surgery for recurrent microdiscectomy at the L4-5 level.  Review of Systems previous problems with hypertension insomnia seronegative rheumatoid arthritis.  L4-5 microdiscectomy 2022.  BMI 47.  Negative for MI negative for history of stroke.  She has been told she has prediabetes.  All other systems noncontributory to HPI.   Objective: Vital Signs: BP 134/82   Pulse (!) 56   Ht 5\' 5"  (1.651 m)   Wt 288 lb (130.6 kg)   BMI 47.93 kg/m   Physical Exam Constitutional:      Appearance: She is well-developed.  HENT:     Head: Normocephalic.     Right Ear: External ear normal.     Left Ear: External ear normal. There is no impacted cerumen.  Eyes:     Pupils: Pupils are equal, round, and reactive to light.  Neck:     Thyroid: No thyromegaly.     Trachea: No tracheal deviation.  Cardiovascular:     Rate and Rhythm: Normal rate.  Pulmonary:     Effort: Pulmonary effort is normal.  Abdominal:     Palpations: Abdomen is soft.  Musculoskeletal:     Cervical back: No rigidity.  Skin:  General: Skin is warm and dry.  Neurological:     Mental Status: She is alert and oriented to person, place, and time.  Psychiatric:        Behavior: Behavior normal.     Ortho Exam positive straight leg raising on the left at 73's 80 degrees on the right.  She has 1 great aunt.  Dorsiflexion and EHL weakness on the left trace weakness on the right.  Gastrocsoleus is strong right and left.  Decreased sensation L5 nerve root on the left trace did not decrease sensation L5 right.  Bilateral sciatic notch tenderness positive popliteal compression test left side negative right side.  Distal pulses are 2+.  Specialty Comments:  MRI LUMBAR SPINE WITHOUT AND WITH CONTRAST   TECHNIQUE: Multiplanar and  multiecho pulse sequences of the lumbar spine were obtained without and with intravenous contrast.   CONTRAST:  10 cc of UA intravenous   COMPARISON:  06/14/2021   FINDINGS: Segmentation:  Standard.   Alignment:  Physiologic.   Vertebrae: No fracture, evidence of discitis, or aggressive bone lesion.   Conus medullaris and cauda equina: Conus extends to the L1-2 level. Conus and cauda equina appear normal.   Paraspinal and other soft tissues: Atrophy of intrinsic back muscles, especially at the thoracolumbar junction and at the level of prior surgery where there is posterior scarring.   Disc levels:   L1-L2: Mild spondylosis   L2-L3: Disc narrowing and bulging with endplate and facet spurring. Small right foraminal protrusion. No neural impingement   L3-L4: Disc narrowing and bulging with endplate and facet spurring. Mild spinal and bilateral foraminal stenosis. Asymmetric left subarticular recess narrowing that is also mild   L4-L5: Disc narrowing and bulging with endplate ridging. New left paracentral herniation compressing the left L5 nerve root. Improved right-sided canal patency after right laminotomy, although there is isointense material at the right subarticular recess that is not clearly enhancing, some residual disc and associated stenosis. Overall moderate spinal stenosis. Facet spurring and disc bulging causes moderate foraminal narrowing on the right.   L5-S1:Disc narrowing and bulging with degenerative facet spurring. Bilateral subarticular recess stenosis that could affect the S1 nerve roots, stable.   IMPRESSION: 1. L4-5 left paracentral herniation impinging on the left L5 nerve root, progressed from 2022. 2. Interval L4-5 right canal decompression with improved patency, although some residual disc and stenosis at the right subarticular recess. Moderate right foraminal narrowing at this level. 3. Stable noncompressive degeneration at the adjacent  levels.     Electronically Signed   By: Tiburcio PeaJonathan  Watts M.D.   On: 07/14/2022 19:13  Imaging: No results found.   PMFS History: Patient Active Problem List   Diagnosis Date Noted   Seronegative rheumatoid arthritis 11/17/2022   Nocturnal hypoxemia 08/29/2022   Pulmonary nodule 08/16/2022   Recurrent herniation of lumbar disc 07/25/2022   OSA on CPAP 05/19/2022   Impingement syndrome of left shoulder 05/13/2022   Morbid (severe) obesity due to excess calories 04/29/2022   Leukocytosis 01/20/2022   Morbid obesity 01/01/2022   Snoring 01/01/2022   Non-restorative sleep 01/01/2022   Chronic fatigue and immune dysfunction syndrome 01/01/2022   High risk medication use 12/02/2021   Otitis externa 11/05/2021   PMR (polymyalgia rheumatica) 11/05/2021   Insomnia 05/28/2021   Malignant (primary) neoplasm, unspecified 03/08/2021   Personal history of urinary (tract) infections 03/08/2021   Personal history of pneumonia (recurrent) 03/08/2021   Elevated serum hCG 07/02/2020   POTS (postural orthostatic tachycardia syndrome) 03/08/2020  Intermittent claudication 01/04/2020   Mixed hyperlipidemia 03/21/2019   Palpitations 03/21/2019   Paroxysmal atrial fibrillation    Localized swelling of left lower extremity    Lightheadedness    Essential hypertension    Cellulitis 06/22/2017   Mitral valve regurgitation 06/22/2017   CVID (common variable immunodeficiency) 05/07/2015   Prediabetes 01/20/2013   History of non-Hodgkin's lymphoma 12/21/2012   Low back pain radiating to right lower extremity 12/10/2012   Mononucleosis 12/08/1983   Past Medical History:  Diagnosis Date   Abscess    Arthritis    Atrial fibrillation ([facility])    Cardiac arrhythmia due to congenital heart disease    trace MR 08/15/20 echo ([place])   CVID (common variable immunodeficiency) ([facility]) 2015   Diverticulosis    Elevated cholesterol    GERD (gastroesophageal reflux disease)    High blood  pressure    Non Hodgkin's lymphoma ([facility]) 1998   Obesity    Personal history of gestational diabetes    PMR (polymyalgia rheumatica) ([facility])    Pneumonia    PONV (postoperative nausea and vomiting)    POTS (postural orthostatic tachycardia syndrome)    Pre-diabetes    UTI (urinary tract infection)     Family History  Problem Relation Age of Onset   Atrial fibrillation Mother    Colon cancer Father    Cancer Father        Colon   Colon cancer Sister    Basal cell carcinoma Brother    Atrial fibrillation Maternal Uncle    Atrial fibrillation Maternal Grandmother    Atrial fibrillation Maternal Grandfather    Heart attack Maternal Grandfather    Ovarian cancer Neg Hx    Stomach cancer Neg Hx    Rectal cancer Neg Hx     Past Surgical History:  Procedure Laterality Date   ABDOMINAL HYSTERECTOMY     APPENDECTOMY     CESAREAN SECTION     GALLBLADDER SURGERY     JOINT REPLACEMENT Left    hip   JOINT REPLACEMENT Right    Right Hip 2022   LAPAROSCOPIC GASTRIC SLEEVE RESECTION  04/29/2022   left shoulder repair     LUMBAR LAMINECTOMY N/A 09/18/2021   Procedure: RIGHT LUMBAR FOUR-FIVE MICRODISCECTOMY;  Surgeon: Eldred MangesYates, Ester Hilley C, MD;  Location: [facility];  Service: Orthopedics;  Laterality: N/A;   LUNG REMOVAL, PARTIAL Right 2009   VATS, wedge resection partial lobectomy, done at [facility]   myringostomy Bilateral    SPLENECTOMY, TOTAL  1998   TONSILLECTOMY AND ADENOIDECTOMY     UPPER GI ENDOSCOPY N/A 04/29/2022   Procedure: UPPER GI ENDOSCOPY;  Surgeon: Sheliah HatchKinsinger, De BlanchLuke Aaron, MD;  Location: [facility];  Service: General;  Laterality: N/A;   Social History   Occupational History   Occupation: rn  Tobacco Use   Smoking status: Former    Packs/day: 0.50    Years: 20.00    Additional pack years: 0.00    Total pack years: 10.00    Types: Cigarettes    Quit date: 09/29/2002    Years since quitting: 20.2    Passive exposure: Current   Smokeless tobacco: Never  Vaping Use   Vaping  Use: Never used  Substance and Sexual Activity   Alcohol use: No   Drug use: No   Sexual activity: Yes    Birth control/protection: Surgical, Post-menopausal    Comment: 1st intercourse 57 yo-5 partners

## 2023-01-05 ENCOUNTER — Other Ambulatory Visit: Payer: Self-pay | Admitting: Orthopaedic Surgery

## 2023-01-05 ENCOUNTER — Other Ambulatory Visit: Payer: Self-pay | Admitting: Internal Medicine

## 2023-01-05 ENCOUNTER — Other Ambulatory Visit (HOSPITAL_COMMUNITY): Payer: Self-pay

## 2023-01-05 MED ORDER — HYDROCODONE-ACETAMINOPHEN 5-325 MG PO TABS
1.0000 | ORAL_TABLET | Freq: Four times a day (QID) | ORAL | 0 refills | Status: DC | PRN
  Filled 2023-01-05: qty 20, 5d supply, fill #0

## 2023-01-05 MED ORDER — DULOXETINE HCL 60 MG PO CPEP
60.0000 mg | ORAL_CAPSULE | Freq: Every day | ORAL | 0 refills | Status: DC
  Filled 2023-01-05 – 2023-01-14 (×3): qty 30, 30d supply, fill #0

## 2023-01-05 MED ORDER — GABAPENTIN 100 MG PO CAPS
ORAL_CAPSULE | ORAL | 0 refills | Status: DC
  Filled 2023-01-05: qty 90, 30d supply, fill #0

## 2023-01-06 ENCOUNTER — Ambulatory Visit: Payer: Commercial Managed Care - PPO | Admitting: Orthopaedic Surgery

## 2023-01-06 ENCOUNTER — Telehealth: Payer: Self-pay | Admitting: Orthopaedic Surgery

## 2023-01-06 DIAGNOSIS — I10 Essential (primary) hypertension: Secondary | ICD-10-CM | POA: Diagnosis not present

## 2023-01-06 DIAGNOSIS — Z7901 Long term (current) use of anticoagulants: Secondary | ICD-10-CM | POA: Diagnosis not present

## 2023-01-06 DIAGNOSIS — D801 Nonfamilial hypogammaglobulinemia: Secondary | ICD-10-CM | POA: Diagnosis not present

## 2023-01-06 DIAGNOSIS — Z79899 Other long term (current) drug therapy: Secondary | ICD-10-CM | POA: Diagnosis not present

## 2023-01-06 NOTE — Telephone Encounter (Signed)
[employer] forms received. To [employer]. 

## 2023-01-08 ENCOUNTER — Other Ambulatory Visit (HOSPITAL_COMMUNITY): Payer: Self-pay

## 2023-01-09 ENCOUNTER — Telehealth: Payer: Self-pay | Admitting: Radiology

## 2023-01-09 ENCOUNTER — Other Ambulatory Visit (HOSPITAL_COMMUNITY): Payer: Self-pay

## 2023-01-09 ENCOUNTER — Other Ambulatory Visit: Payer: Self-pay | Admitting: Orthopaedic Surgery

## 2023-01-09 ENCOUNTER — Other Ambulatory Visit: Payer: Self-pay

## 2023-01-09 MED ORDER — HYDROCODONE-ACETAMINOPHEN 5-325 MG PO TABS
1.0000 | ORAL_TABLET | Freq: Four times a day (QID) | ORAL | 0 refills | Status: DC | PRN
  Filled 2023-01-09: qty 20, 5d supply, fill #0

## 2023-01-09 NOTE — Telephone Encounter (Signed)
Received fax request from [facility] requesting new rx for Hydrocodone 5/325.  The patient's medication was lost by [employer] service and there is an investigation pending with [employer]. Please send in new rx so that they can fill for patient.

## 2023-01-09 NOTE — Telephone Encounter (Signed)
noted 

## 2023-01-10 ENCOUNTER — Other Ambulatory Visit (HOSPITAL_COMMUNITY): Payer: Self-pay

## 2023-01-13 ENCOUNTER — Other Ambulatory Visit: Payer: Self-pay

## 2023-01-13 ENCOUNTER — Telehealth: Payer: Self-pay

## 2023-01-13 DIAGNOSIS — M5126 Other intervertebral disc displacement, lumbar region: Secondary | ICD-10-CM

## 2023-01-13 NOTE — Telephone Encounter (Signed)
Referral entered for [facility].   Sabrina-could you please fax ASAP for patient? She has to have 2 more weeks of PT prior to insurance approving surgery. Thanks.

## 2023-01-13 NOTE — Pre-Procedure Instructions (Signed)
Surgical Instructions    Your procedure is scheduled on Monday, April 29th.  Report to [facility] Main Entrance "A" at 05:30 A.M., then check in with the Admitting office.  Call this number if you have problems the morning of surgery:  [phone]  If you have any questions prior to your surgery date call [phone]: Open Monday-Friday 8am-4pm If you experience any cold or flu symptoms such as cough, fever, chills, shortness of breath, etc. between now and your scheduled surgery, please notify us at the above number.     Remember:  Do not eat after midnight the night before your surgery  You may drink clear liquids until 04:30 AM the morning of your surgery.   Clear liquids allowed are: Water, Non-Citrus Juices (without pulp), Carbonated Beverages, Clear Tea, Black Coffee Only (NO MILK, CREAM OR POWDERED CREAMER of any kind), and Gatorade.   Patient Instructions  The night before surgery:  No food after midnight. ONLY clear liquids after midnight    The day of surgery (if you have diabetes): Drink ONE (1) 12 oz G2 given to you in your pre admission testing appointment by 04:30 AM the morning of surgery. Drink in one sitting. Do not sip.  This drink was given to you during your hospital  pre-op appointment visit.  Nothing else to drink after completing the  12 oz bottle of G2.         If you have questions, please contact your surgeon's office.     Take these medicines the morning of surgery with A SIP OF WATER   If needed: acetaminophen (TYLENOL)  albuterol (VENTOLIN HFA)- bring inhaler with you on day of surgery diphenhydrAMINE (BENADRYL)  famotidine (PEPCID)  HYDROcodone-acetaminophen (NORCO/VICODIN)    Follow your surgeon's instructions on when to stop rivaroxaban (XARELTO).  If no instructions were given by your surgeon then you will need to call the office to get those instructions.     As of today, STOP taking any Aspirin (unless otherwise instructed by your  surgeon) Aleve, Naproxen, Ibuprofen, Motrin, Advil, Goody's, BC's, all herbal medications, fish oil, and all vitamins.                     Do NOT Smoke (Tobacco/Vaping) for 24 hours prior to your procedure.  If you use a CPAP at night, you may bring your mask/headgear for your overnight stay.   Contacts, glasses, piercing's, hearing aid's, dentures or partials may not be worn into surgery, please bring cases for these belongings.    For patients admitted to the hospital, discharge time will be determined by your treatment team.   Patients discharged the day of surgery will not be allowed to drive home, and someone needs to stay with them for 24 hours.  SURGICAL WAITING ROOM VISITATION Patients having surgery or a procedure may have no more than 2 support people in the waiting area - these visitors may rotate.   Children under the age of 55 must have an adult with them who is not the patient. If the patient needs to stay at the hospital during part of their recovery, the visitor guidelines for inpatient rooms apply. Pre-op nurse will coordinate an appropriate time for 1 support person to accompany patient in pre-op.  This support person may not rotate.   Please refer to the [facility] website for the visitor guidelines for Inpatients (after your surgery is over and you are in a regular room).      Please read  over the following fact sheets that you were given.    If you received a COVID test during your pre-op visit  it is requested that you wear a mask when out in public, stay away from anyone that may not be feeling well and notify your surgeon if you develop symptoms. If you have been in contact with anyone that has tested positive in the last 10 days please notify you surgeon.

## 2023-01-13 NOTE — Telephone Encounter (Signed)
Pt needs new PT rx faxed to [facility] since they already d/c her in March. Monique Clark will not approve her surgery until she has completed the full 6 wks of PT and she only had 4 weeks. Once pt completes the 2 more weeks of PT she will let me know so I can appeal her surgery denial.

## 2023-01-14 ENCOUNTER — Inpatient Hospital Stay (HOSPITAL_COMMUNITY)
Admission: RE | Admit: 2023-01-14 | Discharge: 2023-01-14 | Disposition: A | Discharge status: Home / Self Care | Payer: Commercial Managed Care - PPO | Source: Ambulatory Visit

## 2023-01-14 ENCOUNTER — Other Ambulatory Visit: Payer: Self-pay

## 2023-01-14 NOTE — Telephone Encounter (Signed)
Referral faxed to [facility]

## 2023-01-16 ENCOUNTER — Other Ambulatory Visit: Payer: Self-pay

## 2023-01-17 DIAGNOSIS — D801 Nonfamilial hypogammaglobulinemia: Secondary | ICD-10-CM | POA: Diagnosis not present

## 2023-01-20 ENCOUNTER — Other Ambulatory Visit: Payer: Self-pay | Admitting: Orthopaedic Surgery

## 2023-01-20 ENCOUNTER — Other Ambulatory Visit: Payer: Self-pay

## 2023-01-20 ENCOUNTER — Telehealth: Payer: Self-pay | Admitting: Orthopaedic Surgery

## 2023-01-20 DIAGNOSIS — R293 Abnormal posture: Secondary | ICD-10-CM | POA: Diagnosis not present

## 2023-01-20 DIAGNOSIS — M5126 Other intervertebral disc displacement, lumbar region: Secondary | ICD-10-CM | POA: Diagnosis not present

## 2023-01-20 DIAGNOSIS — R262 Difficulty in walking, not elsewhere classified: Secondary | ICD-10-CM | POA: Diagnosis not present

## 2023-01-20 DIAGNOSIS — M79605 Pain in left leg: Secondary | ICD-10-CM | POA: Diagnosis not present

## 2023-01-20 NOTE — Telephone Encounter (Signed)
Can you tell me about this? We sent her back for PT for a couple of weeks because she had only been x 4.  Will he still need to do peer to peer or can we just resubmit?

## 2023-01-20 NOTE — Telephone Encounter (Signed)
Patient called stating she is doing 2nd round of PT, she called [employer] and the said if its within 2 weeks we can do P2P to get her surgery back scheduled, please advise said she will get PT to send over notes

## 2023-01-21 ENCOUNTER — Other Ambulatory Visit (HOSPITAL_COMMUNITY): Payer: Self-pay

## 2023-01-21 ENCOUNTER — Other Ambulatory Visit: Payer: Self-pay

## 2023-01-21 ENCOUNTER — Telehealth: Payer: Self-pay | Admitting: Orthopaedic Surgery

## 2023-01-21 MED ORDER — HYDROCODONE-ACETAMINOPHEN 5-325 MG PO TABS
1.0000 | ORAL_TABLET | Freq: Four times a day (QID) | ORAL | 0 refills | Status: DC | PRN
  Filled 2023-01-21: qty 20, 5d supply, fill #0

## 2023-01-21 NOTE — Telephone Encounter (Signed)
Husband came in to drop off $25 for [employer] work accomodation form and fill out Auth forms will come back to pay for [facility] after surgery is scheduled

## 2023-01-23 ENCOUNTER — Telehealth: Payer: Self-pay | Admitting: Orthopaedic Surgery

## 2023-01-23 ENCOUNTER — Encounter: Payer: Self-pay | Admitting: Orthopaedic Surgery

## 2023-01-23 NOTE — Telephone Encounter (Signed)
noted 

## 2023-01-23 NOTE — Telephone Encounter (Signed)
Note has been entered into system. Thank you.

## 2023-01-23 NOTE — Telephone Encounter (Signed)
[employer] has forms and patient is requesting to work remote from home. Please advise and provide note. Thank you!

## 2023-01-23 NOTE — Telephone Encounter (Signed)
Printed for [employer]. 

## 2023-01-25 DIAGNOSIS — G4733 Obstructive sleep apnea (adult) (pediatric): Secondary | ICD-10-CM | POA: Diagnosis not present

## 2023-01-25 DIAGNOSIS — J189 Pneumonia, unspecified organism: Secondary | ICD-10-CM | POA: Diagnosis not present

## 2023-01-26 ENCOUNTER — Ambulatory Visit: Admit: 2023-01-26 | Payer: Commercial Managed Care - PPO | Admitting: Orthopaedic Surgery

## 2023-01-26 SURGERY — LUMBAR LAMINECTOMY/DECOMPRESSION MICRODISCECTOMY
Anesthesia: General

## 2023-01-27 ENCOUNTER — Other Ambulatory Visit (HOSPITAL_COMMUNITY): Payer: Self-pay

## 2023-01-29 ENCOUNTER — Other Ambulatory Visit: Payer: Self-pay | Admitting: Orthopaedic Surgery

## 2023-01-29 ENCOUNTER — Other Ambulatory Visit (HOSPITAL_COMMUNITY): Payer: Self-pay

## 2023-01-29 ENCOUNTER — Other Ambulatory Visit: Payer: Self-pay

## 2023-01-29 MED ORDER — HYDROCODONE-ACETAMINOPHEN 5-325 MG PO TABS
1.0000 | ORAL_TABLET | Freq: Four times a day (QID) | ORAL | 0 refills | Status: DC | PRN
  Filled 2023-01-29: qty 20, 5d supply, fill #0

## 2023-01-29 MED ORDER — GABAPENTIN 100 MG PO CAPS
ORAL_CAPSULE | ORAL | 0 refills | Status: DC
  Filled 2023-01-29: qty 90, fill #0
  Filled 2023-02-03: qty 90, 30d supply, fill #0

## 2023-01-31 DIAGNOSIS — D801 Nonfamilial hypogammaglobulinemia: Secondary | ICD-10-CM | POA: Diagnosis not present

## 2023-02-03 ENCOUNTER — Other Ambulatory Visit: Payer: Self-pay

## 2023-02-03 ENCOUNTER — Encounter: Payer: Commercial Managed Care - PPO | Admitting: Orthopaedic Surgery

## 2023-02-06 ENCOUNTER — Other Ambulatory Visit (HOSPITAL_COMMUNITY): Payer: Self-pay

## 2023-02-08 ENCOUNTER — Other Ambulatory Visit: Payer: Self-pay | Admitting: Internal Medicine

## 2023-02-08 ENCOUNTER — Other Ambulatory Visit: Payer: Self-pay | Admitting: Orthopaedic Surgery

## 2023-02-09 ENCOUNTER — Other Ambulatory Visit: Payer: Self-pay

## 2023-02-09 ENCOUNTER — Other Ambulatory Visit (HOSPITAL_COMMUNITY): Payer: Self-pay

## 2023-02-09 DIAGNOSIS — L720 Epidermal cyst: Secondary | ICD-10-CM | POA: Diagnosis not present

## 2023-02-09 MED ORDER — HYDROCODONE-ACETAMINOPHEN 5-325 MG PO TABS
1.0000 | ORAL_TABLET | Freq: Four times a day (QID) | ORAL | 0 refills | Status: DC | PRN
  Filled 2023-02-09: qty 20, 5d supply, fill #0

## 2023-02-10 ENCOUNTER — Other Ambulatory Visit (HOSPITAL_COMMUNITY): Payer: Self-pay

## 2023-02-10 ENCOUNTER — Other Ambulatory Visit: Payer: Self-pay

## 2023-02-10 MED ORDER — NEBIVOLOL HCL 5 MG PO TABS
5.0000 mg | ORAL_TABLET | Freq: Every day | ORAL | 4 refills | Status: DC
  Filled 2023-02-10: qty 30, 30d supply, fill #0
  Filled 2023-03-13: qty 30, 30d supply, fill #1
  Filled 2023-05-18: qty 30, 30d supply, fill #2
  Filled 2023-06-29: qty 30, 30d supply, fill #3
  Filled 2023-09-21: qty 30, 30d supply, fill #4

## 2023-02-11 ENCOUNTER — Telehealth: Payer: Self-pay | Admitting: Orthopaedic Surgery

## 2023-02-11 ENCOUNTER — Other Ambulatory Visit: Payer: Self-pay

## 2023-02-11 ENCOUNTER — Other Ambulatory Visit: Payer: Self-pay | Admitting: Orthopaedic Surgery

## 2023-02-11 ENCOUNTER — Other Ambulatory Visit (HOSPITAL_COMMUNITY): Payer: Self-pay

## 2023-02-11 NOTE — Telephone Encounter (Signed)
Pharmacy called in stating they have to get Rx (hydrocodone)  rewritten for pt, and discuss how to go about situation, she is stating it did not get delivered to her house please advise call [phone] [facility] long outpatient ask for [employer]
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
## 2023-02-12 ENCOUNTER — Other Ambulatory Visit: Payer: Self-pay | Admitting: Internal Medicine

## 2023-02-12 ENCOUNTER — Other Ambulatory Visit (HOSPITAL_COMMUNITY): Payer: Self-pay

## 2023-02-12 ENCOUNTER — Other Ambulatory Visit: Payer: Self-pay

## 2023-02-12 ENCOUNTER — Other Ambulatory Visit: Payer: Self-pay | Admitting: Orthopaedic Surgery

## 2023-02-12 ENCOUNTER — Telehealth: Payer: Self-pay | Admitting: Orthopaedic Surgery

## 2023-02-12 MED ORDER — HYDROCODONE-ACETAMINOPHEN 5-325 MG PO TABS
1.0000 | ORAL_TABLET | Freq: Four times a day (QID) | ORAL | 0 refills | Status: DC | PRN
  Filled 2023-02-12: qty 20, 5d supply, fill #0
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
## 2023-02-13 ENCOUNTER — Other Ambulatory Visit (HOSPITAL_COMMUNITY): Payer: Self-pay

## 2023-02-13 ENCOUNTER — Other Ambulatory Visit: Payer: Self-pay | Admitting: Physician Assistant

## 2023-02-13 NOTE — Telephone Encounter (Signed)
I called patient and advised. 

## 2023-02-15 DIAGNOSIS — D801 Nonfamilial hypogammaglobulinemia: Secondary | ICD-10-CM | POA: Diagnosis not present

## 2023-02-16 ENCOUNTER — Other Ambulatory Visit (HOSPITAL_COMMUNITY): Payer: Self-pay

## 2023-02-17 ENCOUNTER — Ambulatory Visit: Payer: Commercial Managed Care - PPO | Admitting: Internal Medicine

## 2023-02-17 DIAGNOSIS — I4891 Unspecified atrial fibrillation: Secondary | ICD-10-CM | POA: Diagnosis not present

## 2023-02-17 DIAGNOSIS — I10 Essential (primary) hypertension: Secondary | ICD-10-CM | POA: Diagnosis not present

## 2023-02-17 DIAGNOSIS — Z6841 Body Mass Index (BMI) 40.0 and over, adult: Secondary | ICD-10-CM | POA: Diagnosis not present

## 2023-02-17 DIAGNOSIS — I340 Nonrheumatic mitral (valve) insufficiency: Secondary | ICD-10-CM | POA: Diagnosis not present

## 2023-02-17 NOTE — Pre-Procedure Instructions (Signed)
Surgical Instructions    Your procedure is scheduled on Feb 27, 2023.  Report to [facility] Main Entrance "A" at 5:30 A.M., then check in with the Admitting office.  Call this number if you have problems the morning of surgery:  [phone]  If you have any questions prior to your surgery date call [phone]: Open Monday-Friday 8am-4pm If you experience any cold or flu symptoms such as cough, fever, chills, shortness of breath, etc. between now and your scheduled surgery, please notify us at the above number.     Remember:  Do not eat after midnight the night before your surgery  You may drink clear liquids until 4:30 AM the morning of your surgery.   Clear liquids allowed are: Water, Non-Citrus Juices (without pulp), Carbonated Beverages, Clear Tea, Black Coffee Only (NO MILK, CREAM OR POWDERED CREAMER of any kind), and Gatorade.  Patient Instructions  The night before surgery:  No food after midnight. ONLY clear liquids after midnight  The day of surgery (if you do NOT have diabetes):  Drink ONE (1) Pre-Surgery Clear Ensure by 4:30 AM the morning of surgery. Drink in one sitting. Do not sip.  This drink was given to you during your hospital  pre-op appointment visit.  Nothing else to drink after completing the  Pre-Surgery Clear Ensure.         If you have questions, please contact your surgeon's office.     Take these medicines the morning of surgery with A SIP OF WATER:  DULoxetine (CYMBALTA)    May take these medicines IF NEEDED:  acetaminophen (TYLENOL)   albuterol (VENTOLIN HFA) inhaler   diphenhydrAMINE (BENADRYL)   famotidine (PEPCID)   HYDROcodone-acetaminophen (NORCO/VICODIN)    DO NOT take your Sarilumab [facility]) injection three days prior to surgery.   Follow your surgeon's instructions on when to stop rivaroxaban (XARELTO).  If no instructions were given by your surgeon then you will need to call the office to get those instructions.     As of  today, STOP taking any Aspirin (unless otherwise instructed by your surgeon) Aleve, Naproxen, Ibuprofen, Motrin, Advil, Goody's, BC's, all herbal medications, fish oil, and all vitamins.                     Do NOT Smoke (Tobacco/Vaping) for 24 hours prior to your procedure.  If you use a CPAP at night, you may bring your mask/headgear for your overnight stay.   Contacts, glasses, piercing's, hearing aid's, dentures or partials may not be worn into surgery, please bring cases for these belongings.    For patients admitted to the hospital, discharge time will be determined by your treatment team.   Patients discharged the day of surgery will not be allowed to drive home, and someone needs to stay with them for 24 hours.  SURGICAL WAITING ROOM VISITATION Patients having surgery or a procedure may have no more than 2 support people in the waiting area - these visitors may rotate.   Children under the age of 10 must have an adult with them who is not the patient. If the patient needs to stay at the hospital during part of their recovery, the visitor guidelines for inpatient rooms apply. Pre-op nurse will coordinate an appropriate time for 1 support person to accompany patient in pre-op.  This support person may not rotate.   Please refer to the [facility] website for the visitor guidelines for Inpatients (after your surgery is over and you are in  a regular room).   If you received a COVID test during your pre-op visit  it is requested that you wear a mask when out in public, stay away from anyone that may not be feeling well and notify your surgeon if you develop symptoms. If you have been in contact with anyone that has tested positive in the last 10 days please notify you surgeon.    Pre-operative 5 CHG Bath Instructions   You can play a key role in reducing the risk of infection after surgery. Your skin needs to be as free of germs as possible. You can reduce the number of germs on your skin  by washing with CHG (chlorhexidine gluconate) soap before surgery. CHG is an antiseptic soap that kills germs and continues to kill germs even after washing.   DO NOT use if you have an allergy to chlorhexidine/CHG or antibacterial soaps. If your skin becomes reddened or irritated, stop using the CHG and notify one of our RNs at [phone].   Please shower with the CHG soap starting 4 days before surgery using the following schedule:     Please keep in mind the following:  DO NOT shave, including legs and underarms, starting the day of your first shower.   You may shave your face at any point before/day of surgery.  Place clean sheets on your bed the day you start using CHG soap. Use a clean washcloth (not used since being washed) for each shower. DO NOT sleep with pets once you start using the CHG.   CHG Shower Instructions:  If you choose to wash your hair and private area, wash first with your normal shampoo/soap.  After you use shampoo/soap, rinse your hair and body thoroughly to remove shampoo/soap residue.  Turn the water OFF and apply about 3 tablespoons (45 ml) of CHG soap to a CLEAN washcloth.  Apply CHG soap ONLY FROM YOUR NECK DOWN TO YOUR TOES (washing for 3-5 minutes)  DO NOT use CHG soap on face, private areas, open wounds, or sores.  Pay special attention to the area where your surgery is being performed.  If you are having back surgery, having someone wash your back for you may be helpful. Wait 2 minutes after CHG soap is applied, then you may rinse off the CHG soap.  Pat dry with a clean towel  Put on clean clothes/pajamas   If you choose to wear lotion, please use ONLY the CHG-compatible lotions on the back of this paper.     Additional instructions for the day of surgery: DO NOT APPLY any lotions, deodorants, cologne, or perfumes.   Do not wear jewelry or makeup Do not bring valuables to the hospital. [facility] is not responsible for any belongings or  valuables. Do not wear nail polish, gel polish, artificial nails, or any other type of covering on natural nails (fingers and toes) Put on clean/comfortable clothes.  Brush your teeth.  Ask your nurse before applying any prescription medications to the skin.      CHG Compatible Lotions   Aveeno Moisturizing lotion  Cetaphil Moisturizing Cream  Cetaphil Moisturizing Lotion  Clairol Herbal Essence Moisturizing Lotion, Dry Skin  Clairol Herbal Essence Moisturizing Lotion, Extra Dry Skin  Clairol Herbal Essence Moisturizing Lotion, Normal Skin  Curel Age Defying Therapeutic Moisturizing Lotion with Alpha Hydroxy  Curel Extreme Care Body Lotion  Curel Soothing Hands Moisturizing Hand Lotion  Curel Therapeutic Moisturizing Cream, Fragrance-Free  Curel Therapeutic Moisturizing Lotion, Fragrance-Free  Curel Therapeutic Moisturizing  Lotion, Original Formula  Eucerin Daily Replenishing Lotion  Eucerin Dry Skin Therapy Plus Alpha Hydroxy Crme  Eucerin Dry Skin Therapy Plus Alpha Hydroxy Lotion  Eucerin Original Crme  Eucerin Original Lotion  Eucerin Plus Crme Eucerin Plus Lotion  Eucerin TriLipid Replenishing Lotion  Keri Anti-Bacterial Hand Lotion  Keri Deep Conditioning Original Lotion Dry Skin Formula Softly Scented  Keri Deep Conditioning Original Lotion, Fragrance Free Sensitive Skin Formula  Keri Lotion Fast Absorbing Fragrance Free Sensitive Skin Formula  Keri Lotion Fast Absorbing Softly Scented Dry Skin Formula  Keri Original Lotion  Keri Skin Renewal Lotion Keri Silky Smooth Lotion  Keri Silky Smooth Sensitive Skin Lotion  Nivea Body Creamy Conditioning Oil  Nivea Body Extra Enriched Lotion  Nivea Body Original Lotion  Nivea Body Sheer Moisturizing Lotion Nivea Crme  Nivea Skin Firming Lotion  NutraDerm 30 Skin Lotion  NutraDerm Skin Lotion  NutraDerm Therapeutic Skin Cream  NutraDerm Therapeutic Skin Lotion  ProShield Protective Hand Cream  Provon moisturizing  lotion     Please read over the following fact sheets that you were given.

## 2023-02-18 ENCOUNTER — Other Ambulatory Visit: Payer: Self-pay

## 2023-02-18 ENCOUNTER — Encounter: Payer: Self-pay | Admitting: Physician Assistant

## 2023-02-18 ENCOUNTER — Encounter (HOSPITAL_COMMUNITY): Payer: Self-pay

## 2023-02-18 ENCOUNTER — Ambulatory Visit: Payer: Commercial Managed Care - PPO | Admitting: Physician Assistant

## 2023-02-18 ENCOUNTER — Encounter (HOSPITAL_COMMUNITY)
Admission: RE | Admit: 2023-02-18 | Discharge: 2023-02-18 | Disposition: A | Discharge status: Home / Self Care | Payer: Commercial Managed Care - PPO | Source: Ambulatory Visit | Attending: Orthopaedic Surgery | Admitting: Orthopaedic Surgery

## 2023-02-18 VITALS — BP 117/67 | HR 57 | Temp 98.3°F | Resp 18 | Ht 65.0 in | Wt 293.3 lb

## 2023-02-18 VITALS — BP 127/79 | HR 68 | Ht 65.0 in | Wt 292.8 lb

## 2023-02-18 DIAGNOSIS — I480 Paroxysmal atrial fibrillation: Secondary | ICD-10-CM | POA: Diagnosis not present

## 2023-02-18 DIAGNOSIS — Z7901 Long term (current) use of anticoagulants: Secondary | ICD-10-CM | POA: Insufficient documentation

## 2023-02-18 DIAGNOSIS — Z01812 Encounter for preprocedural laboratory examination: Secondary | ICD-10-CM | POA: Diagnosis present

## 2023-02-18 DIAGNOSIS — C8590 Non-Hodgkin lymphoma, unspecified, unspecified site: Secondary | ICD-10-CM | POA: Insufficient documentation

## 2023-02-18 DIAGNOSIS — M5126 Other intervertebral disc displacement, lumbar region: Secondary | ICD-10-CM

## 2023-02-18 DIAGNOSIS — E785 Hyperlipidemia, unspecified: Secondary | ICD-10-CM | POA: Insufficient documentation

## 2023-02-18 DIAGNOSIS — I10 Essential (primary) hypertension: Secondary | ICD-10-CM | POA: Diagnosis not present

## 2023-02-18 DIAGNOSIS — I2510 Atherosclerotic heart disease of native coronary artery without angina pectoris: Secondary | ICD-10-CM

## 2023-02-18 DIAGNOSIS — Z01818 Encounter for other preprocedural examination: Secondary | ICD-10-CM

## 2023-02-18 HISTORY — DX: Sleep apnea, unspecified: G47.30

## 2023-02-18 HISTORY — DX: Unspecified asthma, uncomplicated: J45.909

## 2023-02-18 HISTORY — DX: Headache, unspecified: R51.9

## 2023-02-18 LAB — BASIC METABOLIC PANEL
Anion gap: 9 (ref 5–15)
BUN: 19 mg/dL (ref 6–20)
CO2: 28 mmol/L (ref 22–32)
Calcium: 9.4 mg/dL (ref 8.9–10.3)
Chloride: 101 mmol/L (ref 98–111)
Creatinine, Ser: 0.85 mg/dL (ref 0.44–1.00)
GFR, Estimated: >60 mL/min (ref >60)
Glucose, Bld: 100 mg/dL — ABNORMAL HIGH (ref 70–99)
Potassium: 4.2 mmol/L (ref 3.5–5.1)
Sodium: 138 mmol/L (ref 135–145)

## 2023-02-18 LAB — SURGICAL PCR SCREEN
MRSA, PCR: NEGATIVE
Staphylococcus aureus: NEGATIVE

## 2023-02-18 LAB — CBC
HCT: 40.3 % (ref 36.0–46.0)
Hemoglobin: 12.6 g/dL (ref 12.0–15.0)
MCH: 27.5 pg (ref 26.0–34.0)
MCHC: 31.3 g/dL (ref 30.0–36.0)
MCV: 88 fL (ref 80.0–100.0)
Platelets: 335 10*3/uL (ref 150–400)
RBC: 4.58 MIL/uL (ref 3.87–5.11)
RDW: 17.8 % — ABNORMAL HIGH (ref 11.5–15.5)
WBC: 9.9 10*3/uL (ref 4.0–10.5)
nRBC: 0 % (ref 0.0–0.2)

## 2023-02-18 NOTE — Pre-Procedure Instructions (Addendum)
PCP - Hillard Danker Cardiologist - Lanetta Inch- pt reports seeing yesterday for routine visit and having EKG done- records requested Oncologist- [facility] Rheumatologist- Sheliah Hatch  PPM/ICD - denies   Chest x-ray - 08/16/22 EKG - 08/18/22- pt reports that she had an EKG done a there cardiologist yesterday. Records requested. Stress Test - pt reports having a stress test at some point. Records requested.  ECHO - 08/15/20 Cardiac Cath - denies  Sleep Study - 02/18/22 CPAP - pt wears CPAP with 2L O2 at night- pressures settings auto adjust 5-10  Fasting Blood Sugar - pre-DM, pt does not check CBG at home   Last dose of GLP1 agonist-  N/A  Blood Thinner Instructions: Xarelto- pt states that she was told that we would give her Xarelto instructions today. Pt states she will reach out to surgeon and her cardiologist for these instructions.  Aspirin Instructions: N/A  Hizentra- pt states she will reach out to her Oncologist about whether she needs to take any extra doses around surgery time.   Kevzara- pt last dose was 2 weeks ago. Pt states that her Rheumatologist told her to hold her dose that is due today.   Pt instructed to notify surgeon that she takes Hizentra, Kevzara and Xarelto. Pt verbalized understanding.  ERAS Protcol -ERAS + G2   COVID TEST- N/A   Anesthesia review: medical history- CVID. Hx Afib and POTS- pt denies any cardiac symptoms. Follow up on requested records.   Patient denies shortness of breath, fever, cough and chest pain at PAT appointment   All instructions explained to the patient, with a verbal understanding of the material. Patient agrees to go over the instructions while at home for a better understanding. The opportunity to ask questions was provided.

## 2023-02-18 NOTE — Pre-Procedure Instructions (Signed)
Surgical Instructions    Your procedure is scheduled on Feb 27, 2023.  Report to [facility] Main Entrance "A" at 5:30 A.M., then check in with the Admitting office.  Call this number if you have problems the morning of surgery:  [phone]  If you have any questions prior to your surgery date call [phone]: Open Monday-Friday 8am-4pm If you experience any cold or flu symptoms such as cough, fever, chills, shortness of breath, etc. between now and your scheduled surgery, please notify us at the above number.     Remember:  Do not eat after midnight the night before your surgery  You may drink clear liquids until 4:30 AM the morning of your surgery.   Clear liquids allowed are: Water, Non-Citrus Juices (without pulp), Carbonated Beverages, Clear Tea, Black Coffee Only (NO MILK, CREAM OR POWDERED CREAMER of any kind), and Gatorade.  Patient Instructions  The night before surgery:  No food after midnight. ONLY clear liquids after midnight  Patient Instructions The day of surgery: Drink ONE (1) 12 oz G2 given to you in your pre admission testing appointment by 4:30AM the morning of surgery. Drink in one sitting. Do not sip.  This drink was given to you during your hospital  pre-op appointment visit.  Nothing else to drink after completing the  12 oz bottle of G2.         If you have questions, please contact your surgeon's office.      Take these medicines the morning of surgery with A SIP OF WATER:     May take these medicines IF NEEDED:  acetaminophen (TYLENOL)   albuterol (VENTOLIN HFA) inhaler- Please bring all inhalers with you the day of surgery.   diphenhydrAMINE (BENADRYL)   famotidine (PEPCID)   HYDROcodone-acetaminophen (NORCO/VICODIN)    Follow your rheumatologist's instructions regarding your Sarilumab [facility]) injection.  Please contact your oncologist regarding your Hizentra doses around surgery time.   Follow your surgeon's instructions on when to  stop rivaroxaban (XARELTO).  If no instructions were given by your surgeon then you will need to call the office to get those instructions.     As of today, STOP taking any Aspirin (unless otherwise instructed by your surgeon) Aleve, Naproxen, Ibuprofen, Motrin, Advil, Goody's, BC's, all herbal medications, fish oil, and all vitamins.                     Do NOT Smoke (Tobacco/Vaping) for 24 hours prior to your procedure.  If you use a CPAP at night, you may bring your mask/headgear for your overnight stay.   Contacts, glasses, piercing's, hearing aid's, dentures or partials may not be worn into surgery, please bring cases for these belongings.    For patients admitted to the hospital, discharge time will be determined by your treatment team.   Patients discharged the day of surgery will not be allowed to drive home, and someone needs to stay with them for 24 hours.  SURGICAL WAITING ROOM VISITATION Patients having surgery or a procedure may have no more than 2 support people in the waiting area - these visitors may rotate.   Children under the age of 56 must have an adult with them who is not the patient. If the patient needs to stay at the hospital during part of their recovery, the visitor guidelines for inpatient rooms apply. Pre-op nurse will coordinate an appropriate time for 1 support person to accompany patient in pre-op.  This support person may not rotate.  Please refer to the [facility] website for the visitor guidelines for Inpatients (after your surgery is over and you are in a regular room).   If you received a COVID test during your pre-op visit  it is requested that you wear a mask when out in public, stay away from anyone that may not be feeling well and notify your surgeon if you develop symptoms. If you have been in contact with anyone that has tested positive in the last 10 days please notify you surgeon.    Pre-operative 5 Antibacterial Soap Bath Instructions   You  can play a key role in reducing the risk of infection after surgery. Your skin needs to be as free of germs as possible. You can reduce the number of germs on your skin by washing with CHG (chlorhexidine gluconate) soap before surgery. CHG is an antiseptic soap that kills germs and continues to kill germs even after washing.   DO NOT use if you have an allergy to or antibacterial soaps. If your skin becomes reddened or irritated, stop using the CHG and notify one of our RNs at [phone].   Please shower with the Antibacterial Soap  starting 4 days before surgery using the following schedule:     Please keep in mind the following:  DO NOT shave, including legs and underarms, starting the day of your first shower.   You may shave your face at any point before/day of surgery.  Place clean sheets on your bed the day you start using Antibacterial Soap . Use a clean washcloth (not used since being washed) for each shower. DO NOT sleep with pets once you start using the Antibacterial Soap .   Antibacterial Soap  Shower Instructions:  If you choose to wash your hair and private area, wash first with your normal shampoo/soap.  After you use shampoo/soap, rinse your hair and body thoroughly to remove shampoo/soap residue.  Turn the water OFF and apply about 3 tablespoons (45 ml) of Antibacterial Soap  to a CLEAN washcloth.  Apply Antibacterial Soap soap ONLY FROM YOUR NECK DOWN TO YOUR TOES (washing for 3-5 minutes)  DO NOT use Antibacterial Soap soap on face, private areas, open wounds, or sores.  Pay special attention to the area where your surgery is being performed.  If you are having back surgery, having someone wash your back for you may be helpful. Wait 2 minutes after Antibacterial Soap  soap is applied, then you may rinse off the Antibacterial Soap  soap.  Pat dry with a clean towel  Put on clean clothes/pajamas   If you choose to wear lotion, please use ONLY the CHG-compatible lotions on  the back of this paper.     Additional instructions for the day of surgery: DO NOT APPLY any lotions, deodorants, cologne, or perfumes.   Do not wear jewelry or makeup Do not bring valuables to the hospital. [facility] is not responsible for any belongings or valuables. Do not wear nail polish, gel polish, artificial nails, or any other type of covering on natural nails (fingers and toes) Put on clean/comfortable clothes.  Brush your teeth.  Ask your nurse before applying any prescription medications to the skin.      CHG Compatible Lotions   Aveeno Moisturizing lotion  Cetaphil Moisturizing Cream  Cetaphil Moisturizing Lotion  Clairol Herbal Essence Moisturizing Lotion, Dry Skin  Clairol Herbal Essence Moisturizing Lotion, Extra Dry Skin  Clairol Herbal Essence Moisturizing Lotion, Normal Skin  Curel Age Defying Therapeutic  Moisturizing Lotion with Alpha Hydroxy  Curel Extreme Care Body Lotion  Curel Soothing Hands Moisturizing Hand Lotion  Curel Therapeutic Moisturizing Cream, Fragrance-Free  Curel Therapeutic Moisturizing Lotion, Fragrance-Free  Curel Therapeutic Moisturizing Lotion, Original Formula  Eucerin Daily Replenishing Lotion  Eucerin Dry Skin Therapy Plus Alpha Hydroxy Crme  Eucerin Dry Skin Therapy Plus Alpha Hydroxy Lotion  Eucerin Original Crme  Eucerin Original Lotion  Eucerin Plus Crme Eucerin Plus Lotion  Eucerin TriLipid Replenishing Lotion  Keri Anti-Bacterial Hand Lotion  Keri Deep Conditioning Original Lotion Dry Skin Formula Softly Scented  Keri Deep Conditioning Original Lotion, Fragrance Free Sensitive Skin Formula  Keri Lotion Fast Absorbing Fragrance Free Sensitive Skin Formula  Keri Lotion Fast Absorbing Softly Scented Dry Skin Formula  Keri Original Lotion  Keri Skin Renewal Lotion Keri Silky Smooth Lotion  Keri Silky Smooth Sensitive Skin Lotion  Nivea Body Creamy Conditioning Oil  Nivea Body Extra Enriched Lotion  Nivea Body Original  Lotion  Nivea Body Sheer Moisturizing Lotion Nivea Crme  Nivea Skin Firming Lotion  NutraDerm 30 Skin Lotion  NutraDerm Skin Lotion  NutraDerm Therapeutic Skin Cream  NutraDerm Therapeutic Skin Lotion  ProShield Protective Hand Cream  Provon moisturizing lotion     Please read over the following fact sheets that you were given.

## 2023-02-18 NOTE — H&P (View-Only) (Signed)
Monique Clark is an 57 y.o. female.   Chief Complaint: Low Back Pain HPI: HPI 56-year-old female returns with ongoing significant back pain and left leg pain down to the dorsum of her foot with some ankle dorsiflexion weakness.  Patient had previous surgery by me 2 years ago for right L4-5 microdiscectomy.  She has had an MRI scan that showed left paracentral herniation with compression and she states since that time she is gradually gotten worse.  She has had ongoing symptoms and has trouble sitting problems standing cannot stand long cannot walk far.  Due to ongoing problems postmyelogram CT scan has been performed which shows moderate stenosis with compression at L4-5 consistent with compression on both sides.  No instability was noted on myelogram CT scan.  No areas of compression at other levels she did have a little bit of disc bulge at L3-4 and does have some facet arthropathy at L3-4, L4-5 and L5-S1.  She does not have instability and returns today to discuss surgery for recurrent microdiscectomy at the L4-5 level.   Past Medical History:  Diagnosis Date   Abscess    Arthritis    Atrial fibrillation (HCC)    Cardiac arrhythmia due to congenital heart disease    trace MR 08/15/20 echo ([place])   CVID (common variable immunodeficiency) (HCC) 2015   Diverticulosis    Elevated cholesterol    GERD (gastroesophageal reflux disease)    High blood pressure    Non Hodgkin's lymphoma (HCC) 1998   Obesity    Personal history of gestational diabetes    PMR (polymyalgia rheumatica) (HCC)    Pneumonia    PONV (postoperative nausea and vomiting)    POTS (postural orthostatic tachycardia syndrome)    Pre-diabetes    UTI (urinary tract infection)     Past Surgical History:  Procedure Laterality Date   ABDOMINAL HYSTERECTOMY     APPENDECTOMY     CESAREAN SECTION     GALLBLADDER SURGERY     JOINT REPLACEMENT Left    hip   JOINT REPLACEMENT Right    Right Hip 2022    LAPAROSCOPIC GASTRIC SLEEVE RESECTION  04/29/2022   left shoulder repair     LUMBAR LAMINECTOMY N/A 09/18/2021   Procedure: RIGHT LUMBAR FOUR-FIVE MICRODISCECTOMY;  Surgeon: Yates, Mark C, MD;  Location: [facility] OR;  Service: Orthopedics;  Laterality: N/A;   LUNG REMOVAL, PARTIAL Right 2009   VATS, wedge resection partial lobectomy, done at [facility]   myringostomy Bilateral    SPLENECTOMY, TOTAL  1998   TONSILLECTOMY AND ADENOIDECTOMY     UPPER GI ENDOSCOPY N/A 04/29/2022   Procedure: UPPER GI ENDOSCOPY;  Surgeon: Kinsinger, Luke Aaron, MD;  Location: [facility] ORS;  Service: General;  Laterality: N/A;    Family History  Problem Relation Age of Onset   Atrial fibrillation Mother    Colon cancer Father    Cancer Father        Colon   Colon cancer Sister    Basal cell carcinoma Brother    Atrial fibrillation Maternal Uncle    Atrial fibrillation Maternal Grandmother    Atrial fibrillation Maternal Grandfather    Heart attack Maternal Grandfather    Ovarian cancer Neg Hx    Stomach cancer Neg Hx    Rectal cancer Neg Hx    Social History:  reports that she quit smoking about 20 years ago. Her smoking use included cigarettes. She has a 10.00 pack-year smoking history. She has been exposed to tobacco smoke. She   has never used smokeless tobacco. She reports that she does not drink alcohol and does not use drugs.  Allergies:  Allergies  Allergen Reactions   Chlorhexidine Rash   Tetanus-Diphtheria Toxoids Td Swelling    angioedema   Nizatidine Hives    Axid   Promethazine Other (See Comments)    High doses causes confusion   Tetanus Toxoids Other (See Comments)    angioedema    (Not in a hospital admission)   No results found for this or any previous visit (from the past 48 hour(s)). No results found.  Review of Systems  All other systems reviewed and are negative.   There were no vitals taken for this visit. Physical Exam  Objective: Vital Signs: BP 134/82   Pulse (!)  56   Ht 5' 5" (1.651 m)   Wt 288 lb (130.6 kg)   BMI 47.93 kg/m    Physical Exam Constitutional:      Appearance: She is well-developed.  HENT:     Head: Normocephalic.     Right Ear: External ear normal.     Left Ear: External ear normal. There is no impacted cerumen.  Eyes:     Pupils: Pupils are equal, round, and reactive to light.  Neck:     Thyroid: No thyromegaly.     Trachea: No tracheal deviation.  Cardiovascular:     Rate and Rhythm: Normal rate.  Pulmonary:     Effort: Pulmonary effort is normal.  Abdominal:     Palpations: Abdomen is soft.  Musculoskeletal:     Cervical back: No rigidity.  Skin:    General: Skin is warm and dry.  Neurological:     Mental Status: She is alert and oriented to person, place, and time.  Psychiatric:        Behavior: Behavior normal. Assessment/Plan  Recurrent herniation of lumbar disc       Plan: Reviewed previous MRI scan and myelogram CT scan with patient.  Plan microdiscectomy for recurrent disc herniation at the L4-5 level.  Discussed with her that bilateral microdiscectomy may be required.  Procedure discussed risks of recurrent disc herniation, instability, need for possible fusion which would require her to have significant weight loss to be a candidate for this.  Risks of infection discussed.  Questions were elicited and answered she understands request to proceed.  Oswaldo Cueto Anne Tashawna Thom, PA 02/18/2023, 9:37 AM    

## 2023-02-18 NOTE — Progress Notes (Signed)
Office Visit Note   Patient: Monique Clark           Date of Birth: 01-15-1966           MRN: [id number] Visit Date: 02/18/2023              Requested by: Myrlene Broker, MD [address] [place],  [place] [address] PCP: Myrlene Broker, MD  No chief complaint on file.     HPI: Low back pain  HPI 57 year old female returns with ongoing significant back pain and left leg pain down to the dorsum of her foot with some ankle dorsiflexion weakness.  Patient had previous surgery by me 2 years ago for right L4-5 microdiscectomy.  She has had an MRI scan that showed left paracentral herniation with compression and she states since that time she is gradually gotten worse.  She has had ongoing symptoms and has trouble sitting problems standing cannot stand long cannot walk far.  Due to ongoing problems postmyelogram CT scan has been performed which shows moderate stenosis with compression at L4-5 consistent with compression on both sides.  No instability was noted on myelogram CT scan.  No areas of compression at other levels she did have a little bit of disc bulge at L3-4 and does have some facet arthropathy at L3-4, L4-5 and L5-S1.  She does not have instability and returns today to discuss surgery for recurrent microdiscectomy at the L4-5 level.   Assessment & Plan: Visit Diagnoses:  1. Recurrent herniation of lumbar disc     Plan: Plan: Reviewed previous MRI scan and myelogram CT scan with patient.  Plan microdiscectomy for recurrent disc herniation at the L4-5 level.  Discussed with her that bilateral microdiscectomy may be required.  Procedure discussed risks of recurrent disc herniation, instability, need for possible fusion which would require her to have significant weight loss to be a candidate for this.  Risks of infection discussed.  Questions were elicited and answered she understands request to proceed.  Reviewed the risks of surgery with her including bleeding  infection anesthesia complications neurovascular damage need for future per surgery.  She is on Xarelto for chronic A-fib and understands the guidelines for stopping this.  Follow-Up Instructions: Return 1 week post op.   Ortho Exam  Patient is alert, oriented, no adenopathy, well-dressed, normal affect, normal respiratory effort.  Ortho Exam positive straight leg raising on the left at 73's 80 degrees on the right.  She has 1 great aunt.  Dorsiflexion and EHL weakness on the left trace weakness on the right.  Gastrocsoleus is strong right and left.  Decreased sensation L5 nerve root on the left trace did not decrease sensation L5 right.  Bilateral sciatic notch tenderness positive popliteal compression test left side negative right side.  Distal pulses are 2+  Imaging: No results found. No images are attached to the encounter.  Labs: Lab Results  Component Value Date   HGBA1C 5.5 04/16/2022   HGBA1C 5.7 (H) 09/16/2021   HGBA1C 5.8 05/06/2021   ESRSEDRATE 104 (H) 10/09/2022   ESRSEDRATE 119 (H) 05/20/2022   ESRSEDRATE 74 (H) 01/06/2022   CRP 54.1 (H) 10/09/2022   CRP 51.4 (H) 05/20/2022   CRP 33.8 (H) 01/06/2022   REPTSTATUS 08/24/2022 FINAL 08/16/2022   CULT (A) 08/16/2022    CORYNEBACTERIUM, [employer] JK Standardized susceptibility testing for this organism is not available. THE SIGNIFICANCE OF ISOLATING THIS ORGANISM FROM A SINGLE SET OF BLOOD CULTURES WHEN MULTIPLE SETS ARE DRAWN  IS UNCERTAIN. PLEASE NOTIFY THE MICROBIOLOGY DEPARTMENT WITHIN ONE WEEK IF SPECIATION AND SENSITIVITIES ARE REQUIRED. Performed at [facility], [address], [place], [place] [address]      Lab Results  Component Value Date   ALBUMIN 3.6 08/16/2022   ALBUMIN 3.4 (L) 04/16/2022   ALBUMIN 3.2 (L) 09/16/2021    Lab Results  Component Value Date   MG 1.8 03/18/2020   MG 1.8 03/13/2019   No results found for: "VD25OH"  No results found for: "PREALBUMIN"    Latest Ref Rng & Units  11/17/2022   11:29 AM 08/19/2022    3:11 AM 08/17/2022    4:17 PM  CBC EXTENDED  WBC 3.8 - 10.8 Thousand/uL 17.3  19.9  15.9   RBC 3.80 - 5.10 Million/uL 4.17  3.86  4.32   Hemoglobin 11.7 - 15.5 g/dL 60.4  54.0  98.1   HCT 35.0 - 45.0 % 35.9  34.7  38.1   Platelets 140 - 400 Thousand/uL 363  395  425   NEUT# 1,500 - 7,800 cells/uL 12,577     Lymph# 850 - 3,900 cells/uL 3,010        There is no height or weight on file to calculate BMI.  Orders:  No orders of the defined types were placed in this encounter.  No orders of the defined types were placed in this encounter.    Procedures: No procedures performed  Clinical Data: No additional findings.  ROS:  All other systems negative, except as noted in the HPI. Review of Systems  All other systems reviewed and are negative.  Objective: Vital Signs: There were no vitals taken for this visit.  Specialty Comments:  MRI LUMBAR SPINE WITHOUT AND WITH CONTRAST   TECHNIQUE: Multiplanar and multiecho pulse sequences of the lumbar spine were obtained without and with intravenous contrast.   CONTRAST:  10 cc of UA intravenous   COMPARISON:  06/14/2021   FINDINGS: Segmentation:  Standard.   Alignment:  Physiologic.   Vertebrae: No fracture, evidence of discitis, or aggressive bone lesion.   Conus medullaris and cauda equina: Conus extends to the L1-2 level. Conus and cauda equina appear normal.   Paraspinal and other soft tissues: Atrophy of intrinsic back muscles, especially at the thoracolumbar junction and at the level of prior surgery where there is posterior scarring.   Disc levels:   L1-L2: Mild spondylosis   L2-L3: Disc narrowing and bulging with endplate and facet spurring. Small right foraminal protrusion. No neural impingement   L3-L4: Disc narrowing and bulging with endplate and facet spurring. Mild spinal and bilateral foraminal stenosis. Asymmetric left subarticular recess narrowing that is also  mild   L4-L5: Disc narrowing and bulging with endplate ridging. New left paracentral herniation compressing the left L5 nerve root. Improved right-sided canal patency after right laminotomy, although there is isointense material at the right subarticular recess that is not clearly enhancing, some residual disc and associated stenosis. Overall moderate spinal stenosis. Facet spurring and disc bulging causes moderate foraminal narrowing on the right.   L5-S1:Disc narrowing and bulging with degenerative facet spurring. Bilateral subarticular recess stenosis that could affect the S1 nerve roots, stable.   IMPRESSION: 1. L4-5 left paracentral herniation impinging on the left L5 nerve root, progressed from 2022. 2. Interval L4-5 right canal decompression with improved patency, although some residual disc and stenosis at the right subarticular recess. Moderate right foraminal narrowing at this level. 3. Stable noncompressive degeneration at the adjacent levels.  Electronically Signed   By: Tiburcio Pea M.D.   On: 07/14/2022 19:13  PMFS History: Patient Active Problem List   Diagnosis Date Noted   Seronegative rheumatoid arthritis (HCC) 11/17/2022   Nocturnal hypoxemia 08/29/2022   Pulmonary nodule 08/16/2022   Recurrent herniation of lumbar disc 07/25/2022   OSA on CPAP 05/19/2022   Impingement syndrome of left shoulder 05/13/2022   Morbid (severe) obesity due to excess calories (HCC) 04/29/2022   Leukocytosis 01/20/2022   Morbid obesity (HCC) 01/01/2022   Snoring 01/01/2022   Non-restorative sleep 01/01/2022   Chronic fatigue and immune dysfunction syndrome (HCC) 01/01/2022   High risk medication use 12/02/2021   Otitis externa 11/05/2021   PMR (polymyalgia rheumatica) (HCC) 11/05/2021   Insomnia 05/28/2021   Malignant (primary) neoplasm, unspecified (HCC) 03/08/2021   Personal history of urinary (tract) infections 03/08/2021   Personal history of pneumonia (recurrent)  03/08/2021   Elevated serum hCG 07/02/2020   POTS (postural orthostatic tachycardia syndrome) 03/08/2020   Intermittent claudication (HCC) 01/04/2020   Mixed hyperlipidemia 03/21/2019   Palpitations 03/21/2019   Paroxysmal atrial fibrillation (HCC)    Localized swelling of left lower extremity    Lightheadedness    Essential hypertension    Cellulitis 06/22/2017   Mitral valve regurgitation 06/22/2017   CVID (common variable immunodeficiency) (HCC) 05/07/2015   Prediabetes 01/20/2013   History of non-Hodgkin's lymphoma 12/21/2012   Low back pain radiating to right lower extremity 12/10/2012   Mononucleosis 12/08/1983   Past Medical History:  Diagnosis Date   Abscess    Arthritis    Atrial fibrillation (HCC)    Cardiac arrhythmia due to congenital heart disease    trace MR 08/15/20 echo ([place])   CVID (common variable immunodeficiency) (HCC) 2015   Diverticulosis    Elevated cholesterol    GERD (gastroesophageal reflux disease)    High blood pressure    Non Hodgkin's lymphoma (HCC) 1998   Obesity    Personal history of gestational diabetes    PMR (polymyalgia rheumatica) (HCC)    Pneumonia    PONV (postoperative nausea and vomiting)    POTS (postural orthostatic tachycardia syndrome)    Pre-diabetes    UTI (urinary tract infection)     Family History  Problem Relation Age of Onset   Atrial fibrillation Mother    Colon cancer Father    Cancer Father        Colon   Colon cancer Sister    Basal cell carcinoma Brother    Atrial fibrillation Maternal Uncle    Atrial fibrillation Maternal Grandmother    Atrial fibrillation Maternal Grandfather    Heart attack Maternal Grandfather    Ovarian cancer Neg Hx    Stomach cancer Neg Hx    Rectal cancer Neg Hx     Past Surgical History:  Procedure Laterality Date   ABDOMINAL HYSTERECTOMY     APPENDECTOMY     CESAREAN SECTION     GALLBLADDER SURGERY     JOINT REPLACEMENT Left    hip   JOINT REPLACEMENT  Right    Right Hip 2022   LAPAROSCOPIC GASTRIC SLEEVE RESECTION  04/29/2022   left shoulder repair     LUMBAR LAMINECTOMY N/A 09/18/2021   Procedure: RIGHT LUMBAR FOUR-FIVE MICRODISCECTOMY;  Surgeon: [name], MD;  Location: [facility] OR;  Service: Orthopedics;  Laterality: N/A;   LUNG REMOVAL, PARTIAL Right 2009   VATS, wedge resection partial lobectomy, done at [facility]   myringostomy Bilateral    SPLENECTOMY, TOTAL  1998   TONSILLECTOMY AND ADENOIDECTOMY     UPPER GI ENDOSCOPY N/A 04/29/2022   Procedure: UPPER GI ENDOSCOPY;  Surgeon: Sheliah Hatch, De Blanch, MD;  Location: [facility];  Service: General;  Laterality: N/A;   Social History   Occupational History   Occupation: rn  Tobacco Use   Smoking status: Former    Packs/day: 0.50    Years: 20.00    Additional pack years: 0.00    Total pack years: 10.00    Types: Cigarettes    Quit date: 09/29/2002    Years since quitting: 20.4    Passive exposure: Current   Smokeless tobacco: Never  Vaping Use   Vaping Use: Never used  Substance and Sexual Activity   Alcohol use: No   Drug use: No   Sexual activity: Yes    Birth control/protection: Surgical, Post-menopausal    Comment: 1st intercourse 31 yo-5 partners

## 2023-02-18 NOTE — H&P (Signed)
Monique Clark is an 57 y.o. female.   Chief Complaint: Low Back Pain HPI: HPI 57 year old female returns with ongoing significant back pain and left leg pain down to the dorsum of her foot with some ankle dorsiflexion weakness.  Patient had previous surgery by me 2 years ago for right L4-5 microdiscectomy.  She has had an MRI scan that showed left paracentral herniation with compression and she states since that time she is gradually gotten worse.  She has had ongoing symptoms and has trouble sitting problems standing cannot stand long cannot walk far.  Due to ongoing problems postmyelogram CT scan has been performed which shows moderate stenosis with compression at L4-5 consistent with compression on both sides.  No instability was noted on myelogram CT scan.  No areas of compression at other levels she did have a little bit of disc bulge at L3-4 and does have some facet arthropathy at L3-4, L4-5 and L5-S1.  She does not have instability and returns today to discuss surgery for recurrent microdiscectomy at the L4-5 level.   Past Medical History:  Diagnosis Date   Abscess    Arthritis    Atrial fibrillation (HCC)    Cardiac arrhythmia due to congenital heart disease    trace MR 08/15/20 echo ([place])   CVID (common variable immunodeficiency) (HCC) 2015   Diverticulosis    Elevated cholesterol    GERD (gastroesophageal reflux disease)    High blood pressure    Non Hodgkin's lymphoma (HCC) 1998   Obesity    Personal history of gestational diabetes    PMR (polymyalgia rheumatica) (HCC)    Pneumonia    PONV (postoperative nausea and vomiting)    POTS (postural orthostatic tachycardia syndrome)    Pre-diabetes    UTI (urinary tract infection)     Past Surgical History:  Procedure Laterality Date   ABDOMINAL HYSTERECTOMY     APPENDECTOMY     CESAREAN SECTION     GALLBLADDER SURGERY     JOINT REPLACEMENT Left    hip   JOINT REPLACEMENT Right    Right Hip 2022    LAPAROSCOPIC GASTRIC SLEEVE RESECTION  04/29/2022   left shoulder repair     LUMBAR LAMINECTOMY N/A 09/18/2021   Procedure: RIGHT LUMBAR FOUR-FIVE MICRODISCECTOMY;  Surgeon: [name], MD;  Location: [facility];  Service: Orthopedics;  Laterality: N/A;   LUNG REMOVAL, PARTIAL Right 2009   VATS, wedge resection partial lobectomy, done at [facility]   myringostomy Bilateral    SPLENECTOMY, TOTAL  1998   TONSILLECTOMY AND ADENOIDECTOMY     UPPER GI ENDOSCOPY N/A 04/29/2022   Procedure: UPPER GI ENDOSCOPY;  Surgeon: Kinsinger, De Blanch, MD;  Location: [facility];  Service: General;  Laterality: N/A;    Family History  Problem Relation Age of Onset   Atrial fibrillation Mother    Colon cancer Father    Cancer Father        Colon   Colon cancer Sister    Basal cell carcinoma Brother    Atrial fibrillation Maternal Uncle    Atrial fibrillation Maternal Grandmother    Atrial fibrillation Maternal Grandfather    Heart attack Maternal Grandfather    Ovarian cancer Neg Hx    Stomach cancer Neg Hx    Rectal cancer Neg Hx    Social History:  reports that she quit smoking about 20 years ago. Her smoking use included cigarettes. She has a 10.00 pack-year smoking history. She has been exposed to tobacco smoke. She  has never used smokeless tobacco. She reports that she does not drink alcohol and does not use drugs.  Allergies:  Allergies  Allergen Reactions   Chlorhexidine Rash   Tetanus-Diphtheria Toxoids Td Swelling    angioedema   Nizatidine Hives    Axid   Promethazine Other (See Comments)    High doses causes confusion   Tetanus Toxoids Other (See Comments)    angioedema    (Not in a hospital admission)   No results found for this or any previous visit (from the past 48 hour(s)). No results found.  Review of Systems  All other systems reviewed and are negative.   There were no vitals taken for this visit. Physical Exam  Objective: Vital Signs: BP 134/82   Pulse (!)  56   Ht 5\' 5"  (1.651 m)   Wt 288 lb (130.6 kg)   BMI 47.93 kg/m    Physical Exam Constitutional:      Appearance: She is well-developed.  HENT:     Head: Normocephalic.     Right Ear: External ear normal.     Left Ear: External ear normal. There is no impacted cerumen.  Eyes:     Pupils: Pupils are equal, round, and reactive to light.  Neck:     Thyroid: No thyromegaly.     Trachea: No tracheal deviation.  Cardiovascular:     Rate and Rhythm: Normal rate.  Pulmonary:     Effort: Pulmonary effort is normal.  Abdominal:     Palpations: Abdomen is soft.  Musculoskeletal:     Cervical back: No rigidity.  Skin:    General: Skin is warm and dry.  Neurological:     Mental Status: She is alert and oriented to person, place, and time.  Psychiatric:        Behavior: Behavior normal. Assessment/Plan  Recurrent herniation of lumbar disc       Plan: Reviewed previous MRI scan and myelogram CT scan with patient.  Plan microdiscectomy for recurrent disc herniation at the L4-5 level.  Discussed with her that bilateral microdiscectomy may be required.  Procedure discussed risks of recurrent disc herniation, instability, need for possible fusion which would require her to have significant weight loss to be a candidate for this.  Risks of infection discussed.  Questions were elicited and answered she understands request to proceed.  [place] Aveah Castell, PA 02/18/2023, 9:37 AM

## 2023-02-19 ENCOUNTER — Other Ambulatory Visit (HOSPITAL_COMMUNITY): Payer: Self-pay

## 2023-02-19 ENCOUNTER — Other Ambulatory Visit: Payer: Self-pay | Admitting: Internal Medicine

## 2023-02-19 ENCOUNTER — Other Ambulatory Visit: Payer: Self-pay | Admitting: Orthopaedic Surgery

## 2023-02-19 MED ORDER — HYDROCODONE-ACETAMINOPHEN 5-325 MG PO TABS
1.0000 | ORAL_TABLET | Freq: Four times a day (QID) | ORAL | 0 refills | Status: DC | PRN
  Filled 2023-02-19: qty 20, 5d supply, fill #0

## 2023-02-19 NOTE — Progress Notes (Signed)
Anesthesia Chart Review:  [facility] of paroxysmal A-fib, HTN, HLD.  Last seen 02/17/2023 by Dr. Cinda Quest and upcoming surgery was discussed.  Per note, "She is now scheduled to undergo microdiscectomy for fifth lumbar vertebral body with Dr. Annell Greening in [place]. Echocardiogram in November 2021 showed moderate concentric LVH otherwise normal LV ejection fraction and no significant valve disease. Nuclear myocardial perfusion scan in November 2021 was unremarkable. She could not tolerate Eliquis and has since been placed on Xarelto and is very compliant. She is also on flecainide but takes only 100 mg daily as she became bradycardic. She also has history of polymyalgia rheumatica and son prednisone. 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 History of bilateral hip replacement surgery, discectomy at L4/L5 and splenectomy about 20 years ago for spleen enlargement is positive part of lymphoma. Patient denies any chest pain, PND, orthopnea, swelling of the extremities, palpitations, dysuria, hematuria, oliguria, hemoptysis, hematemesis, melena, constipation, diarrhea, headaches, seizures, cough or any wheezing. The review of other pertinent systems is negative... Preoperative cardiovascular examination: Patient needs microdiscectomy.  From cardiac standpoint should be considered low to intermediate risk for perioperative cardiovascular complications.  Acceptable risk for the noncardiac surgery."  History of splenectomy related to non-Hodgkin's lymphoma.  History of gastric sleeve August 2023 at [facility]  History of common variable immunodeficiency, maintained on Hizentra.  Follows with rheumatology for history of PMR, maintained on Kevzara, holding in preparation for surgery at the direction of her rheumatologist.  OSA on CPAP with 2 L supplemental O2.  Patient seen by orthopedics provider Legrand Como, PA on  02/18/2023 for preop H&P.  Regarding Xarelto, note states, "She is on Xarelto for chronic A-fib and understands the guidelines for stopping this."  EKG 02/17/2023 (copy on chart): Sinus bradycardia.  Rate 59.  Low voltage QRS in precordial leads.  Possible anterior myocardial infarction, probably old.    [facility]annie [facility]/Anesthesiology Phone [phone] 02/19/2023 3:42 PM

## 2023-02-19 NOTE — Anesthesia Preprocedure Evaluation (Addendum)
Anesthesia Evaluation  Patient identified by MRN, date of birth, ID band Patient awake    Reviewed: Allergy & Precautions, NPO status , Patient's Chart, lab work & pertinent test results  History of Anesthesia Complications (+) PONV and history of anesthetic complications  Airway Mallampati: IV  TM Distance: >3 FB Neck ROM: Full    Dental  (+) Teeth Intact, Dental Advisory Given   Pulmonary neg shortness of breath, asthma , sleep apnea and Continuous Positive Airway Pressure Ventilation , neg recent URI, former smoker   breath sounds clear to auscultation       Cardiovascular hypertension, Pt. on home beta blockers (-) angina (-) Past MI + dysrhythmias Atrial Fibrillation  Rhythm:Regular  Nl ef 2021   Neuro/Psych  Headaches, neg Seizures  Neuromuscular disease  negative psych ROS   GI/Hepatic Neg liver ROS,GERD  Medicated and Controlled,,  Endo/Other  neg diabetes  Morbid obesity  Renal/GU negative Renal ROS     Musculoskeletal  (+) Arthritis ,    Abdominal   Peds  Hematology negative hematology ROS (+) Lab Results      Component                Value               Date                      WBC                      9.9                 02/18/2023                HGB                      12.6                02/18/2023                HCT                      40.3                02/18/2023                MCV                      88.0                02/18/2023                PLT                      335                 02/18/2023             Xarelto held since 5/28   Anesthesia Other Findings   Reproductive/Obstetrics                             Anesthesia Physical Anesthesia Plan  ASA: 3  Anesthesia Plan: General   Post-op Pain Management: Ofirmev IV (intra-op)* and Ketamine IV*   Induction: Intravenous  PONV Risk Score and Plan: 4 or greater and Ondansetron, Dexamethasone, Propofol  infusion and Midazolam  Airway Management Planned:  Oral ETT  Additional Equipment: None  Intra-op Plan:   Post-operative Plan: Extubation in OR  Informed Consent: I have reviewed the patients History and Physical, chart, labs and discussed the procedure including the risks, benefits and alternatives for the proposed anesthesia with the patient or authorized representative who has indicated his/her understanding and acceptance.     Dental advisory given  Plan Discussed with: CRNA  Anesthesia Plan Comments: (PAT note by Antionette Poles, PA-C: [facility] of paroxysmal A-fib, HTN, HLD.  Last seen 02/17/2023 by Dr. Cinda Quest and upcoming surgery was discussed.  Per note, "She is now scheduled to undergo microdiscectomy for fifth lumbar vertebral body with Dr. Annell Greening in [place]. Echocardiogram in November 2021 showed moderate concentric LVH otherwise normal LV ejection fraction and no significant valve disease. Nuclear myocardial perfusion scan in November 2021 was unremarkable. She could not tolerate Eliquis and has since been placed on Xarelto and is very compliant. She is also on flecainide but takes only 100 mg daily as she became bradycardic. She also has history of polymyalgia rheumatica and son prednisone. 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 History of bilateral hip replacement surgery, discectomy at L4/L5 and splenectomy about 20 years ago for spleen enlargement is positive part of lymphoma. Patient denies any chest pain, PND, orthopnea, swelling of the extremities, palpitations, dysuria, hematuria, oliguria, hemoptysis, hematemesis, melena, constipation, diarrhea, headaches, seizures, cough or any wheezing. The review of other pertinent systems is negative... Preoperative cardiovascular examination: Patient needs microdiscectomy.  From cardiac standpoint should be considered low to intermediate  risk for perioperative cardiovascular complications.  Acceptable risk for the noncardiac surgery."  History of splenectomy related to non-Hodgkin's lymphoma.  History of gastric sleeve August 2023 at [facility]  History of common variable immunodeficiency, maintained on Hizentra.  Follows with rheumatology for history of PMR, maintained on Kevzara, holding in preparation for surgery at the direction of her rheumatologist.  OSA on CPAP with 2 L supplemental O2.  Patient seen by orthopedics provider Legrand Como, PA on 02/18/2023 for preop H&P.  Regarding Xarelto, note states, "She is on Xarelto for chronic A-fib and understands the guidelines for stopping this."  EKG 02/17/2023 (copy on chart): Sinus bradycardia.  Rate 59.  Low voltage QRS in precordial leads.  Possible anterior myocardial infarction, probably old.   )        Anesthesia Quick Evaluation

## 2023-02-20 ENCOUNTER — Other Ambulatory Visit: Payer: Self-pay

## 2023-02-20 ENCOUNTER — Other Ambulatory Visit (HOSPITAL_COMMUNITY): Payer: Self-pay

## 2023-02-24 ENCOUNTER — Other Ambulatory Visit: Payer: Self-pay | Admitting: Internal Medicine

## 2023-02-24 ENCOUNTER — Other Ambulatory Visit (HOSPITAL_COMMUNITY): Payer: Self-pay

## 2023-02-24 DIAGNOSIS — J189 Pneumonia, unspecified organism: Secondary | ICD-10-CM | POA: Diagnosis not present

## 2023-02-24 DIAGNOSIS — Z79899 Other long term (current) drug therapy: Secondary | ICD-10-CM

## 2023-02-24 DIAGNOSIS — M353 Polymyalgia rheumatica: Secondary | ICD-10-CM

## 2023-02-24 DIAGNOSIS — G4733 Obstructive sleep apnea (adult) (pediatric): Secondary | ICD-10-CM | POA: Diagnosis not present

## 2023-02-24 MED ORDER — KEVZARA 200 MG/1.14ML ~~LOC~~ SOAJ
200.0000 mg | SUBCUTANEOUS | 2 refills | Status: DC
Start: 2023-02-24 — End: 2023-06-01
  Filled 2023-02-24: qty 2.28, 28d supply, fill #0
  Filled 2023-04-03: qty 2.28, 28d supply, fill #1
  Filled 2023-05-06: qty 2.28, 28d supply, fill #2

## 2023-02-24 NOTE — Telephone Encounter (Signed)
Last Fill: 12/02/2022  Labs: 02/18/2023 RDW 17.8, Glucose 100   TB Gold: none on file, Chest X-ray 08/16/2022  No active cardiopulmonary disease. [employer] Employee   Mild chronic peribronchial thickening.  Next Visit: 03/24/2023  Last Visit: 11/17/2022  DX:PMR (polymyalgia rheumatica) (HCC)   Current Dose per office note 11/17/2022: Kevzara 200 mg every 14 days.   Okay to refill [name]?

## 2023-02-27 ENCOUNTER — Ambulatory Visit (HOSPITAL_BASED_OUTPATIENT_CLINIC_OR_DEPARTMENT_OTHER): Payer: Commercial Managed Care - PPO | Admitting: Certified Registered Nurse Anesthetist

## 2023-02-27 ENCOUNTER — Encounter (HOSPITAL_COMMUNITY): Payer: Self-pay | Admitting: Orthopaedic Surgery

## 2023-02-27 ENCOUNTER — Other Ambulatory Visit: Payer: Self-pay

## 2023-02-27 ENCOUNTER — Inpatient Hospital Stay (HOSPITAL_COMMUNITY)
Admission: AD | Admit: 2023-02-27 | Discharge: 2023-03-07 | DRG: 519 | Disposition: A | Discharge status: Home / Self Care | Payer: Commercial Managed Care - PPO | Attending: Orthopaedic Surgery | Admitting: Orthopaedic Surgery

## 2023-02-27 ENCOUNTER — Ambulatory Visit (HOSPITAL_COMMUNITY): Payer: Commercial Managed Care - PPO | Admitting: Physician Assistant

## 2023-02-27 ENCOUNTER — Encounter (HOSPITAL_COMMUNITY)
Admission: AD | Disposition: A | Discharge status: Home / Self Care | Payer: Self-pay | Source: Home / Self Care | Attending: Orthopaedic Surgery

## 2023-02-27 ENCOUNTER — Ambulatory Visit (HOSPITAL_COMMUNITY): Payer: Commercial Managed Care - PPO

## 2023-02-27 DIAGNOSIS — Z87891 Personal history of nicotine dependence: Secondary | ICD-10-CM | POA: Diagnosis not present

## 2023-02-27 DIAGNOSIS — M5126 Other intervertebral disc displacement, lumbar region: Principal | ICD-10-CM | POA: Diagnosis present

## 2023-02-27 DIAGNOSIS — I10 Essential (primary) hypertension: Secondary | ICD-10-CM

## 2023-02-27 DIAGNOSIS — Z96643 Presence of artificial hip joint, bilateral: Secondary | ICD-10-CM | POA: Diagnosis present

## 2023-02-27 DIAGNOSIS — Z8249 Family history of ischemic heart disease and other diseases of the circulatory system: Secondary | ICD-10-CM

## 2023-02-27 DIAGNOSIS — Z888 Allergy status to other drugs, medicaments and biological substances status: Secondary | ICD-10-CM

## 2023-02-27 DIAGNOSIS — I4891 Unspecified atrial fibrillation: Secondary | ICD-10-CM | POA: Diagnosis present

## 2023-02-27 DIAGNOSIS — Z902 Acquired absence of lung [part of]: Secondary | ICD-10-CM

## 2023-02-27 DIAGNOSIS — G9609 Other spinal cerebrospinal fluid leak: Secondary | ICD-10-CM | POA: Diagnosis not present

## 2023-02-27 DIAGNOSIS — E7800 Pure hypercholesterolemia, unspecified: Secondary | ICD-10-CM | POA: Diagnosis present

## 2023-02-27 DIAGNOSIS — E669 Obesity, unspecified: Secondary | ICD-10-CM | POA: Diagnosis present

## 2023-02-27 DIAGNOSIS — Z9884 Bariatric surgery status: Secondary | ICD-10-CM

## 2023-02-27 DIAGNOSIS — T8131XA Disruption of external operation (surgical) wound, not elsewhere classified, initial encounter: Secondary | ICD-10-CM | POA: Diagnosis not present

## 2023-02-27 DIAGNOSIS — M48061 Spinal stenosis, lumbar region without neurogenic claudication: Secondary | ICD-10-CM | POA: Diagnosis not present

## 2023-02-27 DIAGNOSIS — Z9081 Acquired absence of spleen: Secondary | ICD-10-CM

## 2023-02-27 DIAGNOSIS — R519 Headache, unspecified: Secondary | ICD-10-CM | POA: Diagnosis not present

## 2023-02-27 DIAGNOSIS — Z6841 Body Mass Index (BMI) 40.0 and over, adult: Secondary | ICD-10-CM

## 2023-02-27 DIAGNOSIS — K219 Gastro-esophageal reflux disease without esophagitis: Secondary | ICD-10-CM | POA: Diagnosis present

## 2023-02-27 DIAGNOSIS — J45909 Unspecified asthma, uncomplicated: Secondary | ICD-10-CM | POA: Diagnosis not present

## 2023-02-27 DIAGNOSIS — G9741 Accidental puncture or laceration of dura during a procedure: Secondary | ICD-10-CM | POA: Diagnosis not present

## 2023-02-27 DIAGNOSIS — R7303 Prediabetes: Secondary | ICD-10-CM | POA: Diagnosis present

## 2023-02-27 DIAGNOSIS — Z9889 Other specified postprocedural states: Secondary | ICD-10-CM

## 2023-02-27 DIAGNOSIS — G971 Other reaction to spinal and lumbar puncture: Secondary | ICD-10-CM | POA: Diagnosis not present

## 2023-02-27 DIAGNOSIS — Z808 Family history of malignant neoplasm of other organs or systems: Secondary | ICD-10-CM

## 2023-02-27 DIAGNOSIS — Z800 Family history of malignant neoplasm of digestive organs: Secondary | ICD-10-CM

## 2023-02-27 DIAGNOSIS — Z8572 Personal history of non-Hodgkin lymphomas: Secondary | ICD-10-CM

## 2023-02-27 DIAGNOSIS — S32009A Unspecified fracture of unspecified lumbar vertebra, initial encounter for closed fracture: Secondary | ICD-10-CM | POA: Diagnosis present

## 2023-02-27 DIAGNOSIS — Y838 Other surgical procedures as the cause of abnormal reaction of the patient, or of later complication, without mention of misadventure at the time of the procedure: Secondary | ICD-10-CM | POA: Diagnosis not present

## 2023-02-27 DIAGNOSIS — M353 Polymyalgia rheumatica: Secondary | ICD-10-CM | POA: Diagnosis present

## 2023-02-27 DIAGNOSIS — Z981 Arthrodesis status: Secondary | ICD-10-CM | POA: Diagnosis not present

## 2023-02-27 DIAGNOSIS — G4730 Sleep apnea, unspecified: Secondary | ICD-10-CM | POA: Diagnosis present

## 2023-02-27 HISTORY — PX: LUMBAR LAMINECTOMY/DECOMPRESSION MICRODISCECTOMY: SHX5026

## 2023-02-27 SURGERY — LUMBAR LAMINECTOMY/DECOMPRESSION MICRODISCECTOMY
Anesthesia: General

## 2023-02-27 MED ORDER — ZOLPIDEM TARTRATE 5 MG PO TABS
5.0000 mg | ORAL_TABLET | Freq: Every evening | ORAL | Status: DC | PRN
  Administered 2023-03-01 – 2023-03-05 (×4): 5 mg via ORAL
  Filled 2023-02-27 (×4): qty 1

## 2023-02-27 MED ORDER — FLECAINIDE ACETATE 100 MG PO TABS
100.0000 mg | ORAL_TABLET | Freq: Every day | ORAL | Status: DC
  Administered 2023-02-27 – 2023-03-06 (×8): 100 mg via ORAL
  Filled 2023-02-27 (×9): qty 1

## 2023-02-27 MED ORDER — CEFAZOLIN IN SODIUM CHLORIDE 3-0.9 GM/100ML-% IV SOLN
3.0000 g | INTRAVENOUS | Status: AC
  Administered 2023-02-27: 3 g via INTRAVENOUS
  Filled 2023-02-27: qty 100

## 2023-02-27 MED ORDER — ALBUTEROL SULFATE (2.5 MG/3ML) 0.083% IN NEBU: 3.0000 mL | INHALATION_SOLUTION | Freq: Four times a day (QID) | RESPIRATORY_TRACT | Status: DC | PRN

## 2023-02-27 MED ORDER — ORAL CARE MOUTH RINSE
15.0000 mL | Freq: Once | OROMUCOSAL | Status: AC
  Administered 2023-02-27: 15 mL via OROMUCOSAL

## 2023-02-27 MED ORDER — LACTATED RINGERS IV SOLN: INTRAVENOUS | Status: DC

## 2023-02-27 MED ORDER — BISACODYL 10 MG RE SUPP: 10.0000 mg | Freq: Every day | RECTAL | Status: DC | PRN

## 2023-02-27 MED ORDER — VITAMIN D (ERGOCALCIFEROL) 1.25 MG (50000 UNIT) PO CAPS
50000.0000 [IU] | ORAL_CAPSULE | ORAL | Status: DC
  Administered 2023-03-02 – 2023-03-05 (×2): 50000 [IU] via ORAL
  Filled 2023-02-27 (×2): qty 1

## 2023-02-27 MED ORDER — CALCIUM CARBONATE ANTACID 500 MG PO CHEW
1.0000 | CHEWABLE_TABLET | Freq: Three times a day (TID) | ORAL | Status: DC
  Administered 2023-02-27 – 2023-03-07 (×23): 200 mg via ORAL
  Filled 2023-02-27 (×22): qty 1

## 2023-02-27 MED ORDER — ADULT MULTIVITAMIN W/MINERALS CH
1.0000 | ORAL_TABLET | Freq: Every day | ORAL | Status: DC
  Administered 2023-02-27 – 2023-03-06 (×8): 1 via ORAL
  Filled 2023-02-27 (×8): qty 1

## 2023-02-27 MED ORDER — PROPOFOL 500 MG/50ML IV EMUL
INTRAVENOUS | Status: DC | PRN
  Administered 2023-02-27: 50 ug/kg/min via INTRAVENOUS

## 2023-02-27 MED ORDER — ACETAMINOPHEN 500 MG PO TABS: 1000.0000 mg | ORAL_TABLET | Freq: Once | ORAL | Status: DC | PRN

## 2023-02-27 MED ORDER — OXYCODONE HCL 5 MG/5ML PO SOLN: 5.0000 mg | Freq: Once | ORAL | Status: AC | PRN

## 2023-02-27 MED ORDER — SODIUM CHLORIDE 0.9 % IV SOLN
250.0000 mL | INTRAVENOUS | Status: DC
  Administered 2023-02-27: 250 mL via INTRAVENOUS

## 2023-02-27 MED ORDER — ROCURONIUM BROMIDE 10 MG/ML (PF) SYRINGE
PREFILLED_SYRINGE | INTRAVENOUS | Status: DC | PRN
  Administered 2023-02-27: 20 mg via INTRAVENOUS
  Administered 2023-02-27: 80 mg via INTRAVENOUS
  Administered 2023-02-27: 20 mg via INTRAVENOUS

## 2023-02-27 MED ORDER — KETAMINE HCL 50 MG/5ML IJ SOSY
PREFILLED_SYRINGE | INTRAMUSCULAR | Status: AC
  Filled 2023-02-27: qty 5

## 2023-02-27 MED ORDER — DEXAMETHASONE SODIUM PHOSPHATE 10 MG/ML IJ SOLN
INTRAMUSCULAR | Status: DC | PRN
  Administered 2023-02-27: 10 mg via INTRAVENOUS

## 2023-02-27 MED ORDER — DULOXETINE HCL 60 MG PO CPEP
60.0000 mg | ORAL_CAPSULE | Freq: Every day | ORAL | Status: DC
  Administered 2023-02-27 – 2023-03-06 (×8): 60 mg via ORAL
  Filled 2023-02-27 (×6): qty 1
  Filled 2023-02-27: qty 2
  Filled 2023-02-27: qty 1

## 2023-02-27 MED ORDER — BUPIVACAINE HCL (PF) 0.25 % IJ SOLN
INTRAMUSCULAR | Status: DC | PRN
  Administered 2023-02-27: 10 mL

## 2023-02-27 MED ORDER — RIVAROXABAN 20 MG PO TABS
20.0000 mg | ORAL_TABLET | Freq: Every day | ORAL | Status: DC
  Administered 2023-03-02: 20 mg via ORAL
  Filled 2023-02-27 (×2): qty 1

## 2023-02-27 MED ORDER — FENTANYL CITRATE (PF) 100 MCG/2ML IJ SOLN
INTRAMUSCULAR | Status: AC
  Filled 2023-02-27: qty 2

## 2023-02-27 MED ORDER — KETOROLAC TROMETHAMINE 30 MG/ML IJ SOLN
INTRAMUSCULAR | Status: AC
  Filled 2023-02-27: qty 1

## 2023-02-27 MED ORDER — MIDAZOLAM HCL 2 MG/2ML IJ SOLN
INTRAMUSCULAR | Status: DC | PRN
  Administered 2023-02-27: 2 mg via INTRAVENOUS

## 2023-02-27 MED ORDER — FENTANYL CITRATE (PF) 250 MCG/5ML IJ SOLN
INTRAMUSCULAR | Status: AC
  Filled 2023-02-27: qty 5

## 2023-02-27 MED ORDER — ONDANSETRON HCL 4 MG/2ML IJ SOLN
INTRAMUSCULAR | Status: DC | PRN
  Administered 2023-02-27: 4 mg via INTRAVENOUS

## 2023-02-27 MED ORDER — MIDAZOLAM HCL 2 MG/2ML IJ SOLN
INTRAMUSCULAR | Status: AC
  Filled 2023-02-27: qty 2

## 2023-02-27 MED ORDER — METHOCARBAMOL 1000 MG/10ML IJ SOLN: 500.0000 mg | Freq: Four times a day (QID) | INTRAVENOUS | Status: DC | PRN

## 2023-02-27 MED ORDER — ACETAMINOPHEN 10 MG/ML IV SOLN
1000.0000 mg | Freq: Once | INTRAVENOUS | Status: DC | PRN
  Administered 2023-02-27: 1000 mg via INTRAVENOUS

## 2023-02-27 MED ORDER — ONDANSETRON HCL 4 MG/2ML IJ SOLN
4.0000 mg | Freq: Four times a day (QID) | INTRAMUSCULAR | Status: DC | PRN
  Administered 2023-02-28: 4 mg via INTRAVENOUS
  Filled 2023-02-27: qty 2

## 2023-02-27 MED ORDER — SCOPOLAMINE 1 MG/3DAYS TD PT72
MEDICATED_PATCH | TRANSDERMAL | Status: DC | PRN
  Administered 2023-02-27: 1 via TRANSDERMAL

## 2023-02-27 MED ORDER — NEBIVOLOL HCL 5 MG PO TABS
5.0000 mg | ORAL_TABLET | Freq: Every day | ORAL | Status: DC
  Administered 2023-02-28 – 2023-03-06 (×6): 5 mg via ORAL
  Filled 2023-02-27 (×9): qty 1

## 2023-02-27 MED ORDER — METHOCARBAMOL 500 MG PO TABS
ORAL_TABLET | ORAL | Status: AC
  Filled 2023-02-27: qty 1

## 2023-02-27 MED ORDER — SODIUM CHLORIDE 0.9 % IV SOLN: INTRAVENOUS | Status: DC

## 2023-02-27 MED ORDER — FENTANYL CITRATE (PF) 100 MCG/2ML IJ SOLN
25.0000 ug | INTRAMUSCULAR | Status: DC | PRN
  Administered 2023-02-27 (×3): 50 ug via INTRAVENOUS

## 2023-02-27 MED ORDER — PHENYLEPHRINE HCL-NACL 20-0.9 MG/250ML-% IV SOLN
INTRAVENOUS | Status: DC | PRN
  Administered 2023-02-27: 30 ug/min via INTRAVENOUS

## 2023-02-27 MED ORDER — HYDROCODONE-ACETAMINOPHEN 7.5-325 MG PO TABS
1.0000 | ORAL_TABLET | ORAL | Status: DC | PRN
  Administered 2023-03-01 – 2023-03-06 (×4): 1 via ORAL
  Filled 2023-02-27 (×4): qty 1

## 2023-02-27 MED ORDER — SODIUM CHLORIDE 0.9% FLUSH: 3.0000 mL | INTRAVENOUS | Status: DC | PRN

## 2023-02-27 MED ORDER — ACETAMINOPHEN 650 MG RE SUPP: 650.0000 mg | RECTAL | Status: DC | PRN

## 2023-02-27 MED ORDER — HYDROCODONE-ACETAMINOPHEN 7.5-325 MG PO TABS
2.0000 | ORAL_TABLET | ORAL | Status: DC | PRN
  Administered 2023-02-27 – 2023-03-07 (×26): 2 via ORAL
  Filled 2023-02-27 (×29): qty 2

## 2023-02-27 MED ORDER — 0.9 % SODIUM CHLORIDE (POUR BTL) OPTIME
TOPICAL | Status: DC | PRN
  Administered 2023-02-27: 1000 mL

## 2023-02-27 MED ORDER — FENTANYL CITRATE (PF) 250 MCG/5ML IJ SOLN
INTRAMUSCULAR | Status: DC | PRN
  Administered 2023-02-27: 150 ug via INTRAVENOUS
  Administered 2023-02-27: 100 ug via INTRAVENOUS

## 2023-02-27 MED ORDER — OXYCODONE HCL 5 MG PO TABS
ORAL_TABLET | ORAL | Status: AC
  Filled 2023-02-27: qty 1

## 2023-02-27 MED ORDER — HYDROCODONE-ACETAMINOPHEN 7.5-325 MG PO TABS
ORAL_TABLET | ORAL | Status: AC
  Filled 2023-02-27: qty 2

## 2023-02-27 MED ORDER — KETAMINE HCL 10 MG/ML IJ SOLN
INTRAMUSCULAR | Status: DC | PRN
  Administered 2023-02-27: 30 mg via INTRAVENOUS
  Administered 2023-02-27: 10 mg via INTRAVENOUS

## 2023-02-27 MED ORDER — MENTHOL 3 MG MT LOZG: 1.0000 | LOZENGE | OROMUCOSAL | Status: DC | PRN

## 2023-02-27 MED ORDER — ACETAMINOPHEN 160 MG/5ML PO SOLN: 1000.0000 mg | Freq: Once | ORAL | Status: DC | PRN

## 2023-02-27 MED ORDER — KETOROLAC TROMETHAMINE 30 MG/ML IJ SOLN
30.0000 mg | Freq: Once | INTRAMUSCULAR | Status: AC
  Administered 2023-02-27: 30 mg via INTRAVENOUS

## 2023-02-27 MED ORDER — OXYCODONE HCL 5 MG PO TABS
5.0000 mg | ORAL_TABLET | Freq: Once | ORAL | Status: AC | PRN
  Administered 2023-02-27: 5 mg via ORAL

## 2023-02-27 MED ORDER — SODIUM CHLORIDE 0.9% FLUSH
3.0000 mL | Freq: Two times a day (BID) | INTRAVENOUS | Status: DC
  Administered 2023-02-28 – 2023-03-07 (×15): 3 mL via INTRAVENOUS

## 2023-02-27 MED ORDER — DOCUSATE SODIUM 100 MG PO CAPS
200.0000 mg | ORAL_CAPSULE | Freq: Every day | ORAL | Status: DC
  Administered 2023-02-27 – 2023-03-06 (×8): 200 mg via ORAL
  Filled 2023-02-27 (×8): qty 2

## 2023-02-27 MED ORDER — ROCURONIUM BROMIDE 10 MG/ML (PF) SYRINGE
PREFILLED_SYRINGE | INTRAVENOUS | Status: AC
  Filled 2023-02-27: qty 10

## 2023-02-27 MED ORDER — LIDOCAINE 2% (20 MG/ML) 5 ML SYRINGE
INTRAMUSCULAR | Status: DC | PRN
  Administered 2023-02-27: 90 mg via INTRAVENOUS

## 2023-02-27 MED ORDER — GABAPENTIN 300 MG PO CAPS
300.0000 mg | ORAL_CAPSULE | Freq: Every day | ORAL | Status: DC
  Administered 2023-02-27 – 2023-03-06 (×8): 300 mg via ORAL
  Filled 2023-02-27 (×8): qty 1

## 2023-02-27 MED ORDER — POTASSIUM CHLORIDE IN NACL 20-0.45 MEQ/L-% IV SOLN
INTRAVENOUS | Status: DC
  Filled 2023-02-27: qty 1000

## 2023-02-27 MED ORDER — FAMOTIDINE 20 MG PO TABS: 20.0000 mg | ORAL_TABLET | Freq: Every day | ORAL | Status: DC | PRN

## 2023-02-27 MED ORDER — SARILUMAB 200 MG/1.14ML ~~LOC~~ SOAJ: 200.0000 mg | SUBCUTANEOUS | Status: DC

## 2023-02-27 MED ORDER — PHENOL 1.4 % MT LIQD: 1.0000 | OROMUCOSAL | Status: DC | PRN

## 2023-02-27 MED ORDER — IMMUNE GLOBULIN (HUMAN) 4 GM/20ML ~~LOC~~ SOLN: 36.0000 g | SUBCUTANEOUS | Status: DC

## 2023-02-27 MED ORDER — POLYETHYLENE GLYCOL 3350 17 G PO PACK
17.0000 g | PACK | Freq: Every day | ORAL | Status: DC | PRN
  Administered 2023-03-02 – 2023-03-03 (×2): 17 g via ORAL
  Filled 2023-02-27 (×2): qty 1

## 2023-02-27 MED ORDER — ONDANSETRON HCL 4 MG PO TABS
4.0000 mg | ORAL_TABLET | Freq: Four times a day (QID) | ORAL | Status: DC | PRN
  Administered 2023-03-01: 4 mg via ORAL
  Filled 2023-02-27: qty 1

## 2023-02-27 MED ORDER — ACETAMINOPHEN 10 MG/ML IV SOLN
INTRAVENOUS | Status: AC
  Filled 2023-02-27: qty 100

## 2023-02-27 MED ORDER — DEXAMETHASONE SODIUM PHOSPHATE 10 MG/ML IJ SOLN
INTRAMUSCULAR | Status: AC
  Filled 2023-02-27: qty 1

## 2023-02-27 MED ORDER — PROPOFOL 10 MG/ML IV BOLUS
INTRAVENOUS | Status: DC | PRN
  Administered 2023-02-27: 160 mg via INTRAVENOUS

## 2023-02-27 MED ORDER — PHENYLEPHRINE 80 MCG/ML (10ML) SYRINGE FOR IV PUSH (FOR BLOOD PRESSURE SUPPORT)
PREFILLED_SYRINGE | INTRAVENOUS | Status: DC | PRN
  Administered 2023-02-27: 160 ug via INTRAVENOUS
  Administered 2023-02-27: 80 ug via INTRAVENOUS

## 2023-02-27 MED ORDER — ACETAMINOPHEN 500 MG PO TABS
1000.0000 mg | ORAL_TABLET | Freq: Four times a day (QID) | ORAL | Status: DC
  Administered 2023-02-27 – 2023-02-28 (×2): 1000 mg via ORAL
  Filled 2023-02-27 (×2): qty 2

## 2023-02-27 MED ORDER — METHOCARBAMOL 500 MG PO TABS
500.0000 mg | ORAL_TABLET | Freq: Four times a day (QID) | ORAL | Status: DC | PRN
  Administered 2023-02-27 – 2023-03-03 (×5): 500 mg via ORAL
  Filled 2023-02-27 (×4): qty 1

## 2023-02-27 MED ORDER — ONDANSETRON HCL 4 MG/2ML IJ SOLN
INTRAMUSCULAR | Status: AC
  Filled 2023-02-27: qty 2

## 2023-02-27 MED ORDER — ACETAMINOPHEN 325 MG PO TABS
650.0000 mg | ORAL_TABLET | ORAL | Status: DC | PRN
  Administered 2023-02-28: 650 mg via ORAL
  Filled 2023-02-27: qty 2

## 2023-02-27 MED ORDER — BUPIVACAINE HCL (PF) 0.25 % IJ SOLN
INTRAMUSCULAR | Status: AC
  Filled 2023-02-27: qty 30

## 2023-02-27 SURGICAL SUPPLY — 54 items
ADH SKN CLS APL DERMABOND .7 (GAUZE/BANDAGES/DRESSINGS) ×1
APL SRG 60D 8 XTD TIP BNDBL (TIP) ×1
BAG COUNTER SPONGE SURGICOUNT (BAG) ×1 IMPLANT
BAG SPNG CNTER NS LX DISP (BAG) ×1
BUR NEURO DRILL SOFT 3.0X3.8M (BURR) IMPLANT
BUR ROUND FLUTED 4 SOFT TCH (BURR) IMPLANT
CANISTER SUCT 3000ML PPV (MISCELLANEOUS) ×1 IMPLANT
CLSR STERI-STRIP ANTIMIC 1/2X4 (GAUZE/BANDAGES/DRESSINGS) ×1 IMPLANT
COVER SURGICAL LIGHT HANDLE (MISCELLANEOUS) ×1 IMPLANT
DERMABOND ADVANCED .7 DNX12 (GAUZE/BANDAGES/DRESSINGS) IMPLANT
DRAPE HALF SHEET 40X57 (DRAPES) ×2 IMPLANT
DRAPE MICROSCOPE SLANT 54X150 (MISCELLANEOUS) ×1 IMPLANT
DRAPE SURG 17X23 STRL (DRAPES) ×1 IMPLANT
DRESSING MEPILEX FLEX 4X4 (GAUZE/BANDAGES/DRESSINGS) ×1 IMPLANT
DRSG MEPILEX FLEX 4X4 (GAUZE/BANDAGES/DRESSINGS)
DRSG MEPILEX POST OP 4X12 (GAUZE/BANDAGES/DRESSINGS) IMPLANT
DURAPREP 26ML APPLICATOR (WOUND CARE) ×1 IMPLANT
DURASEAL APPLICATOR TIP (TIP) IMPLANT
DURASEAL SPINE SEALANT 3ML (MISCELLANEOUS) IMPLANT
ELECT BLADE 4.0 EZ CLEAN MEGAD (MISCELLANEOUS) ×1
ELECT REM PT RETURN 9FT ADLT (ELECTROSURGICAL) ×1
ELECTRODE BLDE 4.0 EZ CLN MEGD (MISCELLANEOUS) IMPLANT
ELECTRODE REM PT RTRN 9FT ADLT (ELECTROSURGICAL) ×1 IMPLANT
GLOVE BIOGEL PI IND STRL 8 (GLOVE) ×2 IMPLANT
GLOVE ORTHO TXT STRL SZ7.5 (GLOVE) ×2 IMPLANT
GOWN STRL REUS W/ TWL LRG LVL3 (GOWN DISPOSABLE) ×2 IMPLANT
GOWN STRL REUS W/ TWL XL LVL3 (GOWN DISPOSABLE) ×1 IMPLANT
GOWN STRL REUS W/TWL 2XL LVL3 (GOWN DISPOSABLE) ×1 IMPLANT
GOWN STRL REUS W/TWL LRG LVL3 (GOWN DISPOSABLE) ×2
GOWN STRL REUS W/TWL XL LVL3 (GOWN DISPOSABLE) ×1
KIT BASIN OR (CUSTOM PROCEDURE TRAY) ×1 IMPLANT
KIT TURNOVER KIT B (KITS) ×1 IMPLANT
MANIFOLD NEPTUNE II (INSTRUMENTS) ×1 IMPLANT
NDL HYPO 25GX1X1/2 BEV (NEEDLE) ×1 IMPLANT
NDL SPNL 18GX3.5 QUINCKE PK (NEEDLE) ×1 IMPLANT
NEEDLE HYPO 25GX1X1/2 BEV (NEEDLE) ×1 IMPLANT
NEEDLE SPNL 18GX3.5 QUINCKE PK (NEEDLE) ×1 IMPLANT
NS IRRIG 1000ML POUR BTL (IV SOLUTION) ×1 IMPLANT
PACK LAMINECTOMY ORTHO (CUSTOM PROCEDURE TRAY) ×1 IMPLANT
PAD ARMBOARD 7.5X6 YLW CONV (MISCELLANEOUS) ×2 IMPLANT
PATTIES SURGICAL .5 X.5 (GAUZE/BANDAGES/DRESSINGS) IMPLANT
PATTIES SURGICAL .75X.75 (GAUZE/BANDAGES/DRESSINGS) IMPLANT
SPIKE FLUID TRANSFER (MISCELLANEOUS) ×1 IMPLANT
SUT NURALON 4 0 TR CR/8 (SUTURE) IMPLANT
SUT VIC AB 0 CT1 27 (SUTURE) ×1
SUT VIC AB 0 CT1 27XBRD ANBCTR (SUTURE) IMPLANT
SUT VIC AB 1 CTX 36 (SUTURE) ×1
SUT VIC AB 1 CTX36XBRD ANBCTR (SUTURE) ×1 IMPLANT
SUT VIC AB 2-0 CT1 27 (SUTURE) ×2
SUT VIC AB 2-0 CT1 TAPERPNT 27 (SUTURE) ×1 IMPLANT
SUT VIC AB 3-0 X1 27 (SUTURE) ×1 IMPLANT
TAPE CLOTH SURG 4X10 WHT LF (GAUZE/BANDAGES/DRESSINGS) IMPLANT
TOWEL GREEN STERILE (TOWEL DISPOSABLE) ×1 IMPLANT
TOWEL GREEN STERILE FF (TOWEL DISPOSABLE) ×1 IMPLANT

## 2023-02-27 NOTE — Transfer of Care (Signed)
Immediate Anesthesia Transfer of Care Note  Patient: Monique Clark  Procedure(s) Performed: L4-5 LUMBAR DECOMPRESSION, MICRODISCECTOMY  Patient Location: PACU  Anesthesia Type:General  Level of Consciousness: awake, alert , and oriented  Airway & Oxygen Therapy: Patient Spontanous Breathing and Patient connected to nasal cannula oxygen  Post-op Assessment: Report given to RN and Post -op Vital signs reviewed and stable  Post vital signs: Reviewed and stable  Last Vitals:  Vitals Value Taken Time  BP 119/65 02/27/23 1000  Temp 36.6 C 02/27/23 0945  Pulse 58 02/27/23 1000  Resp 11 02/27/23 1000  SpO2 94 % 02/27/23 1000  Vitals shown include unvalidated device data.  Last Pain:  Vitals:   02/27/23 0600  TempSrc:   PainSc: 4          Complications: No notable events documented.

## 2023-02-27 NOTE — Interval H&P Note (Signed)
History and Physical Interval Note:  02/27/2023 7:22 AM  Monique Clark  has presented today for surgery, with the diagnosis of L4-5 disc protrusion, stenosis.  The various methods of treatment have been discussed with the patient and family. After consideration of risks, benefits and other options for treatment, the patient has consented to  Procedure(s): L4-5 LUMBAR DECOMPRESSION, MICRODISCECTOMY (N/A) as a surgical intervention.  The patient's history has been reviewed, patient examined, no change in status, stable for surgery.  I have reviewed the patient's chart and labs.  Questions were answered to the patient's satisfaction.     [name]

## 2023-02-27 NOTE — Anesthesia Procedure Notes (Signed)
Procedure Name: Intubation Date/Time: 02/27/2023 7:50 AM  Performed by: Darryl Nestle, CRNAPre-anesthesia Checklist: Patient identified, Emergency Drugs available, Suction available and Patient being monitored Patient Re-evaluated:Patient Re-evaluated prior to induction Oxygen Delivery Method: Circle system utilized Preoxygenation: Pre-oxygenation with 100% oxygen Induction Type: IV induction Ventilation: Mask ventilation without difficulty Laryngoscope Size: Mac and 3 Grade View: Grade I Tube type: Oral Tube size: 7.0 mm Number of attempts: 1 Airway Equipment and Method: Stylet and Oral airway Placement Confirmation: ETT inserted through vocal cords under direct vision, positive ETCO2 and breath sounds checked- equal and bilateral Secured at: 21 cm Tube secured with: Tape Dental Injury: Teeth and Oropharynx as per pre-operative assessment

## 2023-02-27 NOTE — Op Note (Addendum)
Pre and postop diagnosis: L4-5 HNP with lumbar spinal stenosis, previous right hemilaminectomy and microdiscectomy.  (Previous L4-5 microdiscectomy)  Procedure: L4-5 decompression with left microdiscectomy for recurrent herniated nuclease palposis  Surgeon: Annell Greening, MD  Assistant: Willia Craze, MD  Anesthesia: General orotracheal +10 cc Marcaine local  Complications: 2 mm dural bleb without CSF leak reinforced with single suture.  EBL: Less than 150 cc.  Procedure: After intubation prone positioning on chest rolls roll pads anterior to the shoulders patient had BMI greater than 45 extra pads were applied for knees feet ulnar nerves.  Tape was used to pull down flat in the skin of the lumbar spine patient's previous incision was visualized and DuraPrep was used 0 squared with towels sterile skin marker used on the old scar and then Betadine Steri-Drape laminectomy sheets and drapes.  Timeout procedure was completed Ancef was given 3 g prophylactically.  Old incision was opened to cerebellar retractors were placed we progressed down to the spinous process.  2 x-rays were taken initially with Kocher and then adjusted second x-ray and then self-retaining McCullough retractor with the deepest blades were placed.  Spinous process was removed lamina was thinned with a small bur on the left side thick chunks of ligament were removed.  Patient had some adherence to the dura and micro section using dural separator micro pickups and 2 mm Kerrison was used.  As the ligament was being peeled off the dura carefully with using the operative microscope small bleb occurred.  No CSF leak.  Single supra suture was placed Nurolon tied no CSF leak.  Valsalva 30 cmH2O no CSF leak.  Some DuraSeal was just tripped over the top.  Continued removal of the ligaments laterally in the gutter decompressing the nerve root and annulus was sized selections of disc were taken with disc protrusion slightly caudad migration of the  bulging disc which were teased and then removed using straight micropituitary regular straight pituitary up-biting pituitaries and Epstein curettes as well as a Woodson inside the disc.  Once distal decompressed and Lorette Ang could be passed anterior to the dura with the disc being flat no longer compressing we repeated check areas of the bleb repair which again was dry epidural veins were coagulated with bipolar cautery.  Tight closure of the fascia with interrupted #1 Vicryl 2-0 Vicryl subtenons tissue 3-0 Vicryl subcu closure Dermabond Steri-Strips and Mepilex dressing was applied.  Patient transferred to care room.  Patient states flat to the morning and then can begin mobilization.

## 2023-02-28 ENCOUNTER — Encounter (HOSPITAL_COMMUNITY): Payer: Self-pay | Admitting: Orthopaedic Surgery

## 2023-02-28 MED ORDER — HYDROMORPHONE HCL 1 MG/ML IJ SOLN
0.5000 mg | INTRAMUSCULAR | Status: DC | PRN
  Administered 2023-02-28: 1 mg via INTRAVENOUS
  Administered 2023-02-28 (×2): 0.5 mg via INTRAVENOUS
  Administered 2023-03-01 (×2): 1 mg via INTRAVENOUS
  Filled 2023-02-28 (×5): qty 1

## 2023-02-28 NOTE — Plan of Care (Signed)

## 2023-02-28 NOTE — Progress Notes (Signed)
PT Cancellation Note  Patient Details Name: Monique Clark MRN: [id number] DOB: [birth year]   Cancelled Treatment:    Reason Eval/Treat Not Completed: Active bedrest order. New order to start tomorrow.  Will f/u on 6/3 to see if pt allowed to increase activity.     Donnella Sham 02/28/2023, 9:01 AM Lavona Mound, PT   Acute Rehabilitation Services  Office [phone] 02/28/2023

## 2023-02-28 NOTE — Progress Notes (Signed)
Patient ID: Monique Clark, female   DOB: Jan 26, 1966, 57 y.o.   MRN: [id number]   Subjective: 1 Day Post-Op Procedure(s) (LRB): L4-5 LUMBAR DECOMPRESSION, MICRODISCECTOMY (N/A) Patient reports pain as  mild incisional pain. Sat up this AM for breakfast and had severe headache. Placed flat .    Objective: Vital signs in last 24 hours: Temp:  [97.6 F (36.4 C)-99.1 F (37.3 C)] 98.6 F (37 C) (06/01 0711) Pulse Rate:  [54-66] 62 (06/01 0711) Resp:  [0-21] 19 (06/01 0711) BP: (92-133)/(46-78) 105/46 (06/01 0711) SpO2:  [91 %-98 %] 97 % (06/01 0711)  Intake/Output from previous day: 05/31 0701 - 06/01 0700 In: 1760 [P.O.:960; I.V.:800] Out: 100 [Blood:100] Intake/Output this shift: No intake/output data recorded.  No results for input(s): "HGB" in the last 72 hours. No results for input(s): "WBC", "RBC", "HCT", "PLT" in the last 72 hours. No results for input(s): "NA", "K", "CL", "CO2", "BUN", "CREATININE", "GLUCOSE", "CALCIUM" in the last 72 hours. No results for input(s): "LABPT", "INR" in the last 72 hours.  Neurologically intact DG Lumbar Spine 2-3 Views  Result Date: 02/27/2023 CLINICAL DATA:  [id number] Surgery, elective [id number] EXAM: LUMBAR SPINE - 2-3 VIEW COMPARISON:  CT 12/30/2022, radiograph 10/16/2022 FINDINGS: Intraoperative lateral radiographs for localization. On image 1, instruments are located at the level of L5-S1. On image 2, instruments are located at the level of L4-L5. IMPRESSION: Intraoperative localization as described above. Electronically Signed   By: Caprice Renshaw M.D.   On: 02/27/2023 12:43    Assessment/Plan: 1 Day Post-Op Procedure(s) (LRB): L4-5 LUMBAR DECOMPRESSION, MICRODISCECTOMY (N/A) Plan: stay flat until this afternoon and if she has HA when she sits up will need to stay down this weekend.   [name] 02/28/2023, 7:44 AM

## 2023-02-28 NOTE — Progress Notes (Signed)
OT Cancellation Note  Patient Details Name: Monique Clark MRN: [id number] DOB: 1966-04-23   Cancelled Treatment:    Reason Eval/Treat Not Completed: Active bedrest order (Original imminent orders acknowledged with new bed rest orders placed due to severe HA. OT to f/u tomorrow if appropriate.)  Donia Pounds 02/28/2023, 8:12 AM

## 2023-02-28 NOTE — Anesthesia Postprocedure Evaluation (Signed)
Anesthesia Post Note  Patient: Monique Clark  Procedure(s) Performed: L4-5 LUMBAR DECOMPRESSION, MICRODISCECTOMY     Patient location during evaluation: PACU Anesthesia Type: General Level of consciousness: awake and alert Pain management: pain level controlled Vital Signs Assessment: post-procedure vital signs reviewed and stable Respiratory status: spontaneous breathing, nonlabored ventilation and respiratory function stable Cardiovascular status: stable Postop Assessment: no apparent nausea or vomiting Anesthetic complications: no   No notable events documented.  Last Vitals:  Vitals:   02/28/23 0711 02/28/23 0909  BP: (!) 105/46 (!) 94/40  Pulse: 62 66  Resp: 19 18  Temp: 37 C 37.3 C  SpO2: 97% 92%    Last Pain:  Vitals:   02/28/23 0909  TempSrc: Oral  PainSc:                  Elli Groesbeck

## 2023-03-01 DIAGNOSIS — Z888 Allergy status to other drugs, medicaments and biological substances status: Secondary | ICD-10-CM | POA: Diagnosis not present

## 2023-03-01 DIAGNOSIS — I4891 Unspecified atrial fibrillation: Secondary | ICD-10-CM | POA: Diagnosis not present

## 2023-03-01 DIAGNOSIS — Z800 Family history of malignant neoplasm of digestive organs: Secondary | ICD-10-CM | POA: Diagnosis not present

## 2023-03-01 DIAGNOSIS — G9741 Accidental puncture or laceration of dura during a procedure: Secondary | ICD-10-CM | POA: Diagnosis not present

## 2023-03-01 DIAGNOSIS — R7303 Prediabetes: Secondary | ICD-10-CM | POA: Diagnosis present

## 2023-03-01 DIAGNOSIS — J45909 Unspecified asthma, uncomplicated: Secondary | ICD-10-CM | POA: Diagnosis not present

## 2023-03-01 DIAGNOSIS — Z902 Acquired absence of lung [part of]: Secondary | ICD-10-CM | POA: Diagnosis not present

## 2023-03-01 DIAGNOSIS — G4730 Sleep apnea, unspecified: Secondary | ICD-10-CM | POA: Diagnosis present

## 2023-03-01 DIAGNOSIS — G9609 Other spinal cerebrospinal fluid leak: Secondary | ICD-10-CM | POA: Diagnosis not present

## 2023-03-01 DIAGNOSIS — M353 Polymyalgia rheumatica: Secondary | ICD-10-CM | POA: Diagnosis not present

## 2023-03-01 DIAGNOSIS — M48061 Spinal stenosis, lumbar region without neurogenic claudication: Secondary | ICD-10-CM | POA: Diagnosis not present

## 2023-03-01 DIAGNOSIS — Z8249 Family history of ischemic heart disease and other diseases of the circulatory system: Secondary | ICD-10-CM | POA: Diagnosis not present

## 2023-03-01 DIAGNOSIS — S32009A Unspecified fracture of unspecified lumbar vertebra, initial encounter for closed fracture: Secondary | ICD-10-CM | POA: Diagnosis present

## 2023-03-01 DIAGNOSIS — D801 Nonfamilial hypogammaglobulinemia: Secondary | ICD-10-CM | POA: Diagnosis not present

## 2023-03-01 DIAGNOSIS — I10 Essential (primary) hypertension: Secondary | ICD-10-CM | POA: Diagnosis not present

## 2023-03-01 DIAGNOSIS — Z87891 Personal history of nicotine dependence: Secondary | ICD-10-CM | POA: Diagnosis not present

## 2023-03-01 DIAGNOSIS — Z8572 Personal history of non-Hodgkin lymphomas: Secondary | ICD-10-CM | POA: Diagnosis not present

## 2023-03-01 DIAGNOSIS — M96842 Postprocedural seroma of a musculoskeletal structure following a musculoskeletal system procedure: Secondary | ICD-10-CM | POA: Diagnosis not present

## 2023-03-01 DIAGNOSIS — R519 Headache, unspecified: Secondary | ICD-10-CM | POA: Diagnosis not present

## 2023-03-01 DIAGNOSIS — Z9884 Bariatric surgery status: Secondary | ICD-10-CM | POA: Diagnosis not present

## 2023-03-01 DIAGNOSIS — T8131XA Disruption of external operation (surgical) wound, not elsewhere classified, initial encounter: Secondary | ICD-10-CM | POA: Diagnosis not present

## 2023-03-01 DIAGNOSIS — M5126 Other intervertebral disc displacement, lumbar region: Secondary | ICD-10-CM | POA: Diagnosis not present

## 2023-03-01 DIAGNOSIS — E669 Obesity, unspecified: Secondary | ICD-10-CM | POA: Diagnosis not present

## 2023-03-01 DIAGNOSIS — Z6841 Body Mass Index (BMI) 40.0 and over, adult: Secondary | ICD-10-CM | POA: Diagnosis not present

## 2023-03-01 DIAGNOSIS — G971 Other reaction to spinal and lumbar puncture: Secondary | ICD-10-CM | POA: Diagnosis not present

## 2023-03-01 DIAGNOSIS — Z96643 Presence of artificial hip joint, bilateral: Secondary | ICD-10-CM | POA: Diagnosis present

## 2023-03-01 DIAGNOSIS — Z9889 Other specified postprocedural states: Secondary | ICD-10-CM | POA: Diagnosis not present

## 2023-03-01 DIAGNOSIS — K219 Gastro-esophageal reflux disease without esophagitis: Secondary | ICD-10-CM | POA: Diagnosis present

## 2023-03-01 DIAGNOSIS — Z808 Family history of malignant neoplasm of other organs or systems: Secondary | ICD-10-CM | POA: Diagnosis not present

## 2023-03-01 DIAGNOSIS — R531 Weakness: Secondary | ICD-10-CM | POA: Diagnosis not present

## 2023-03-01 DIAGNOSIS — Y838 Other surgical procedures as the cause of abnormal reaction of the patient, or of later complication, without mention of misadventure at the time of the procedure: Secondary | ICD-10-CM | POA: Diagnosis not present

## 2023-03-01 DIAGNOSIS — Z9081 Acquired absence of spleen: Secondary | ICD-10-CM | POA: Diagnosis not present

## 2023-03-01 DIAGNOSIS — E7800 Pure hypercholesterolemia, unspecified: Secondary | ICD-10-CM | POA: Diagnosis not present

## 2023-03-01 DIAGNOSIS — G4733 Obstructive sleep apnea (adult) (pediatric): Secondary | ICD-10-CM | POA: Diagnosis not present

## 2023-03-01 LAB — CBC
HCT: 34.2 % — ABNORMAL LOW (ref 36.0–46.0)
Hemoglobin: 10.5 g/dL — ABNORMAL LOW (ref 12.0–15.0)
MCH: 27.9 pg (ref 26.0–34.0)
MCHC: 30.7 g/dL (ref 30.0–36.0)
MCV: 91 fL (ref 80.0–100.0)
Platelets: 226 10*3/uL (ref 150–400)
RBC: 3.76 MIL/uL — ABNORMAL LOW (ref 3.87–5.11)
RDW: 18 % — ABNORMAL HIGH (ref 11.5–15.5)
WBC: 12.4 10*3/uL — ABNORMAL HIGH (ref 4.0–10.5)
nRBC: 0 % (ref 0.0–0.2)

## 2023-03-01 LAB — BASIC METABOLIC PANEL
Anion gap: 7 (ref 5–15)
BUN: 11 mg/dL (ref 6–20)
CO2: 26 mmol/L (ref 22–32)
Calcium: 8.4 mg/dL — ABNORMAL LOW (ref 8.9–10.3)
Chloride: 102 mmol/L (ref 98–111)
Creatinine, Ser: 0.6 mg/dL (ref 0.44–1.00)
GFR, Estimated: >60 mL/min (ref >60)
Glucose, Bld: 121 mg/dL — ABNORMAL HIGH (ref 70–99)
Potassium: 3.7 mmol/L (ref 3.5–5.1)
Sodium: 135 mmol/L (ref 135–145)

## 2023-03-01 NOTE — Progress Notes (Addendum)
PT Cancellation Note  Patient Details Name: Monique Clark MRN: [id number] DOB: September 18, 1966   Cancelled Treatment:    Reason Eval/Treat Not Completed: Active bedrest order  Discussed case with Dr. Ophelia Charter;  Will remain on bedrest until Tuesday;  Will complete PT order and anticipate PT Reorder on Tuesday;   Van Clines, PT  Acute Rehabilitation Services Office [phone] Secure Chat welcomed    Levi Aland 03/01/2023, 9:39 AM

## 2023-03-01 NOTE — Progress Notes (Signed)
   03/01/23 2109  BiPAP/CPAP/SIPAP  BiPAP/CPAP/SIPAP Pt Type Adult  BiPAP/CPAP/SIPAP  (Home unit)  Flow Rate 2 lpm  Patient Home Equipment Yes   Patient has home unit CPAP at beside. Patient able to place on when she is ready.

## 2023-03-01 NOTE — Progress Notes (Signed)
Patient ID: Monique Clark, female   DOB: 10/17/1965, 57 y.o.   MRN: [id number]   Subjective: 2 Days Post-Op Procedure(s) (LRB): L4-5 LUMBAR DECOMPRESSION, MICRODISCECTOMY (N/A) Patient reports pain as moderate.  Taking 3 oxy every 4 hrs . Headache better .   Objective: Vital signs in last 24 hours: Temp:  [98.1 F (36.7 C)-99.9 F (37.7 C)] 99.9 F (37.7 C) (06/02 0734) Pulse Rate:  [60-74] 63 (06/02 0734) Resp:  [16-18] 18 (06/02 0734) BP: (103-125)/(43-65) 111/48 (06/02 0734) SpO2:  [90 %-95 %] 90 % (06/02 0734)  Intake/Output from previous day: 06/01 0701 - 06/02 0700 In: 504.9 [P.O.:480; I.V.:24.9] Out: -  Intake/Output this shift: No intake/output data recorded.  No results for input(s): "HGB" in the last 72 hours. No results for input(s): "WBC", "RBC", "HCT", "PLT" in the last 72 hours. No results for input(s): "NA", "K", "CL", "CO2", "BUN", "CREATININE", "GLUCOSE", "CALCIUM" in the last 72 hours. No results for input(s): "LABPT", "INR" in the last 72 hours.  Neurologically intact No results found.  Assessment/Plan: 2 Days Post-Op Procedure(s) (LRB): L4-5 LUMBAR DECOMPRESSION, MICRODISCECTOMY (N/A) Plan:   leg pain good. Headache better . Still down until Tuesday AM. Changed to admit status   [name] 03/01/2023, 9:17 AM

## 2023-03-01 NOTE — Progress Notes (Signed)
PT Cancellation Note  Patient Details Name: Monique Clark MRN: [id number] DOB: 1965-11-17   Cancelled Treatment:    Reason Eval/Treat Not Completed: Active bedrest order  Noted OT has contacted pt's nurse re: keeping informed when pt's bedrest is lifted;  Will follow along and anticipate working with her (and seeing if HA returns with sitting up) later today;   Van Clines, PT  Acute Rehabilitation Services Office [phone] Secure Chat welcomed    Levi Aland 03/01/2023, 7:53 AM

## 2023-03-01 NOTE — Progress Notes (Signed)
OT Cancellation Note  Patient Details Name: Monique Clark MRN: [id number] DOB: 10-16-1965   Cancelled Treatment:    Reason Eval/Treat Not Completed: Active bedrest order OT order received and appreciated however this conflicts with current bedrest order set. Please increase activity tolerance as appropriate and remove bedrest from orders. . Please contact OT at [phone] if bed rest order is discontinued. OT will hold evaluation at this time and will check back as time allows pending increased activity orders.   Mateo Flow 03/01/2023, 6:47 AM

## 2023-03-02 ENCOUNTER — Encounter: Payer: Commercial Managed Care - PPO | Admitting: Skilled Nursing Facility1

## 2023-03-02 NOTE — Progress Notes (Signed)
Patient ID: Monique Clark, female   DOB: [birth year], 57 y.o.   MRN: [id number]   Subjective: 3 Days Post-Op Procedure(s) (LRB): L4-5 LUMBAR DECOMPRESSION, MICRODISCECTOMY (N/A) Patient reports pain as mild.    Objective: Vital signs in last 24 hours: Temp:  [98.3 F (36.8 C)-98.4 F (36.9 C)] 98.3 F (36.8 C) (06/03 1426) Pulse Rate:  [56-65] 65 (06/03 1426) Resp:  [16-20] 16 (06/03 1426) BP: (122-146)/(44-66) 128/44 (06/03 1426) SpO2:  [93 %-97 %] 97 % (06/03 1426)  Intake/Output from previous day: 06/02 0701 - 06/03 0700 In: -  Out: 800 [Urine:800] Intake/Output this shift: Total I/O In: 240 [P.O.:240] Out: -   Recent Labs    03/01/23 1016  HGB 10.5*   Recent Labs    03/01/23 1016  WBC 12.4*  RBC 3.76*  HCT 34.2*  PLT 226   Recent Labs    03/01/23 1016  NA 135  K 3.7  CL 102  CO2 26  BUN 11  CREATININE 0.60  GLUCOSE 121*  CALCIUM 8.4*   No results for input(s): "LABPT", "INR" in the last 72 hours.  Neurologically intact No results found.  Assessment/Plan: 3 Days Post-Op Procedure(s) (LRB): L4-5 LUMBAR DECOMPRESSION, MICRODISCECTOMY (N/A)  Patient has been at bedrest no bowel movement yet.  She will try the bedpan.  Her dressing had trace amount of serosanguineous on the bottom which is Mepilex.  I removed it and she had mostly clear fluid draining which was able to be expressed.  This is at the inferior aspect incision consistent with seroma likely CSF fluid from the tiny bleb which was not leaking intraoperatively but now appears to be leaking.  Cleaned with Betadine.  Tincture benzoin applied.  Dermabond, Steri-Strips, Tegaderm, new Mepilex applied.  Discussed with patient options.  Discussed case with Dr. Jordan Likes 1 the neurosurgeons as well.  If patient continues to have some leakage will take back to the operating room Wednesday afternoon and reclose the area of the inferior aspect of the incision that is dehisced which is small and remove the  seroma which is likely CSF fluid.  The small bleb that she had that was closed with a single 4-0 Nurolon suture was tiny.  We discussed with her sometimes tiny tears like more but she did not have any leakage at the time when the small blood was noted as well as after the suture closure as well as after placement of the DuraSeal over the top.  Patient's large and when she was up and mobile started having headache.  Plan repeat closure with some 2-0 nylon with vertical mattress sutures and will add some Dermabond.  Discussed with the patient.  Patient continues to have leak we may have Dr. Stann Mainland consider exploration.  Questions were elicited and answered she understands plan.  Will posterior for operating room time Wednesday afternoon if she does not leak and new dressing stays dry then we do not need to proceed with surgery.  [name] 03/02/2023, 5:20 PM

## 2023-03-03 ENCOUNTER — Other Ambulatory Visit (HOSPITAL_COMMUNITY): Payer: Self-pay

## 2023-03-03 ENCOUNTER — Other Ambulatory Visit: Payer: Self-pay

## 2023-03-03 MED ORDER — OXYCODONE-ACETAMINOPHEN 5-325 MG PO TABS
1.0000 | ORAL_TABLET | Freq: Four times a day (QID) | ORAL | 0 refills | Status: DC | PRN
  Filled 2023-03-03 – 2023-03-07 (×2): qty 40, 5d supply, fill #0

## 2023-03-03 MED ORDER — METHOCARBAMOL 500 MG PO TABS
500.0000 mg | ORAL_TABLET | Freq: Four times a day (QID) | ORAL | 0 refills | Status: DC | PRN
  Filled 2023-03-03 (×2): qty 30, 8d supply, fill #0

## 2023-03-03 NOTE — Progress Notes (Signed)
Patient ID: Najaya Holdaway, female   DOB: [birth year], 57 y.o.   MRN: [id number]   Subjective: 4 Days Post-Op Procedure(s) (LRB): L4-5 LUMBAR DECOMPRESSION, MICRODISCECTOMY (N/A) Patient reports pain as mild.    Objective: Vital signs in last 24 hours: Temp:  [98.3 F (36.8 C)-98.8 F (37.1 C)] 98.8 F (37.1 C) (06/04 0749) Pulse Rate:  [56-65] 60 (06/04 0749) Resp:  [16-19] 16 (06/04 0749) BP: (96-128)/(38-64) 111/55 (06/04 0749) SpO2:  [93 %-99 %] 95 % (06/04 0749)  Intake/Output from previous day: 06/03 0701 - 06/04 0700 In: 240 [P.O.:240] Out: -  Intake/Output this shift: No intake/output data recorded.  Recent Labs    03/01/23 1016  HGB 10.5*   Recent Labs    03/01/23 1016  WBC 12.4*  RBC 3.76*  HCT 34.2*  PLT 226   Recent Labs    03/01/23 1016  NA 135  K 3.7  CL 102  CO2 26  BUN 11  CREATININE 0.60  GLUCOSE 121*  CALCIUM 8.4*   No results for input(s): "LABPT", "INR" in the last 72 hours.  Physical exam:  night nurse changed dressing with new white Mepilex due to serous drainage. Dressing now dry.  No results found.  Assessment/Plan: 4 Days Post-Op Procedure(s) (LRB): L4-5 LUMBAR DECOMPRESSION, MICRODISCECTOMY (N/A) Plan:  return to OR tomorrow for evacuation of seroma and reclosure with nylon sutures.   [name] 03/03/2023, 8:04 AM

## 2023-03-03 NOTE — Progress Notes (Deleted)
Patient ID: Monique Clark, female   DOB: 09/17/1966, 57 y.o.   MRN: [id number]   Subjective: 4 Days Post-Op Procedure(s) (LRB): L4-5 LUMBAR DECOMPRESSION, MICRODISCECTOMY (N/A) Patient reports pain as mild.  Left arm pain resolved.   Objective: Vital signs in last 24 hours: Temp:  [98.3 F (36.8 C)-98.7 F (37.1 C)] 98.7 F (37.1 C) (06/03 1938) Pulse Rate:  [56-65] 58 (06/04 0416) Resp:  [16-19] 18 (06/04 0416) BP: (96-128)/(38-64) 96/48 (06/04 0416) SpO2:  [93 %-99 %] 94 % (06/04 0416)  Intake/Output from previous day: 06/03 0701 - 06/04 0700 In: 240 [P.O.:240] Out: -  Intake/Output this shift: No intake/output data recorded.  Recent Labs    03/01/23 1016  HGB 10.5*   Recent Labs    03/01/23 1016  WBC 12.4*  RBC 3.76*  HCT 34.2*  PLT 226   Recent Labs    03/01/23 1016  NA 135  K 3.7  CL 102  CO2 26  BUN 11  CREATININE 0.60  GLUCOSE 121*  CALCIUM 8.4*   No results for input(s): "LABPT", "INR" in the last 72 hours.  Neurologically intact No results found.  Assessment/Plan: 4 Days Post-Op Procedure(s) (LRB): L4-5 LUMBAR DECOMPRESSION, MICRODISCECTOMY (N/A) Plan: discharge home , office June 12th.  [name] 03/03/2023, 7:42 AM

## 2023-03-03 NOTE — Progress Notes (Signed)
Pt has home unit at bedside and places when ready.   03/02/23 2210  BiPAP/CPAP/SIPAP  BiPAP/CPAP/SIPAP Pt Type Adult  BiPAP/CPAP/SIPAP  (home unit)  Flow Rate 2 lpm  Patient Home Equipment Yes

## 2023-03-03 NOTE — Discharge Instructions (Addendum)
OK to shower with dressing on. Do not change unless there is drainage. See Dr. Ophelia Charter in office on Tuesday 03/24/23 call for appt time. If problems Dr. Christell Constant is in town and available. Walk daily. Avoid falling. Avoid heavy lifting and straining.

## 2023-03-04 ENCOUNTER — Encounter (HOSPITAL_COMMUNITY): Payer: Self-pay | Admitting: Orthopaedic Surgery

## 2023-03-04 ENCOUNTER — Inpatient Hospital Stay (HOSPITAL_COMMUNITY): Payer: Commercial Managed Care - PPO

## 2023-03-04 ENCOUNTER — Encounter (HOSPITAL_COMMUNITY)
Admission: AD | Disposition: A | Discharge status: Home / Self Care | Payer: Self-pay | Source: Home / Self Care | Attending: Orthopaedic Surgery

## 2023-03-04 DIAGNOSIS — I4891 Unspecified atrial fibrillation: Secondary | ICD-10-CM

## 2023-03-04 DIAGNOSIS — Z9889 Other specified postprocedural states: Secondary | ICD-10-CM

## 2023-03-04 DIAGNOSIS — Z87891 Personal history of nicotine dependence: Secondary | ICD-10-CM

## 2023-03-04 DIAGNOSIS — M96842 Postprocedural seroma of a musculoskeletal structure following a musculoskeletal system procedure: Secondary | ICD-10-CM

## 2023-03-04 DIAGNOSIS — I10 Essential (primary) hypertension: Secondary | ICD-10-CM

## 2023-03-04 HISTORY — PX: HEMATOMA EVACUATION: SHX5118

## 2023-03-04 LAB — SURGICAL PCR SCREEN
MRSA, PCR: NEGATIVE
Staphylococcus aureus: NEGATIVE

## 2023-03-04 SURGERY — EVACUATION HEMATOMA
Anesthesia: General | Site: Back

## 2023-03-04 MED ORDER — ONDANSETRON HCL 4 MG/2ML IJ SOLN
INTRAMUSCULAR | Status: AC
  Filled 2023-03-04: qty 2

## 2023-03-04 MED ORDER — CEFAZOLIN IN SODIUM CHLORIDE 3-0.9 GM/100ML-% IV SOLN
3.0000 g | INTRAVENOUS | Status: AC
  Administered 2023-03-04: 3 g via INTRAVENOUS
  Filled 2023-03-04: qty 100

## 2023-03-04 MED ORDER — FLUCONAZOLE 100 MG PO TABS
100.0000 mg | ORAL_TABLET | Freq: Once | ORAL | Status: DC
  Filled 2023-03-04: qty 1

## 2023-03-04 MED ORDER — CHLORHEXIDINE GLUCONATE 0.12 % MT SOLN
OROMUCOSAL | Status: AC
  Filled 2023-03-04: qty 15

## 2023-03-04 MED ORDER — FENTANYL CITRATE (PF) 100 MCG/2ML IJ SOLN
INTRAMUSCULAR | Status: AC
  Filled 2023-03-04: qty 2

## 2023-03-04 MED ORDER — FENTANYL CITRATE (PF) 250 MCG/5ML IJ SOLN
INTRAMUSCULAR | Status: DC | PRN
  Administered 2023-03-04 (×2): 25 ug via INTRAVENOUS
  Administered 2023-03-04 (×2): 50 ug via INTRAVENOUS
  Administered 2023-03-04: 100 ug via INTRAVENOUS

## 2023-03-04 MED ORDER — CELECOXIB 200 MG PO CAPS
200.0000 mg | ORAL_CAPSULE | Freq: Two times a day (BID) | ORAL | Status: DC
  Administered 2023-03-04 – 2023-03-07 (×6): 200 mg via ORAL
  Filled 2023-03-04 (×6): qty 1

## 2023-03-04 MED ORDER — DEXAMETHASONE SODIUM PHOSPHATE 10 MG/ML IJ SOLN
INTRAMUSCULAR | Status: DC | PRN
  Administered 2023-03-04: 4 mg via INTRAVENOUS

## 2023-03-04 MED ORDER — SUCCINYLCHOLINE CHLORIDE 200 MG/10ML IV SOSY
PREFILLED_SYRINGE | INTRAVENOUS | Status: AC
  Filled 2023-03-04: qty 10

## 2023-03-04 MED ORDER — OXYCODONE HCL 5 MG PO TABS: 5.0000 mg | ORAL_TABLET | Freq: Once | ORAL | Status: DC | PRN

## 2023-03-04 MED ORDER — SODIUM CHLORIDE 0.9% FLUSH: 3.0000 mL | INTRAVENOUS | Status: DC | PRN

## 2023-03-04 MED ORDER — FENTANYL CITRATE (PF) 100 MCG/2ML IJ SOLN
25.0000 ug | INTRAMUSCULAR | Status: DC | PRN
  Administered 2023-03-04: 50 ug via INTRAVENOUS
  Administered 2023-03-04 (×2): 25 ug via INTRAVENOUS

## 2023-03-04 MED ORDER — ONDANSETRON HCL 4 MG PO TABS: 4.0000 mg | ORAL_TABLET | Freq: Four times a day (QID) | ORAL | Status: DC | PRN

## 2023-03-04 MED ORDER — METHOCARBAMOL 500 MG PO TABS
500.0000 mg | ORAL_TABLET | Freq: Four times a day (QID) | ORAL | Status: DC | PRN
  Administered 2023-03-05: 500 mg via ORAL
  Filled 2023-03-04: qty 1

## 2023-03-04 MED ORDER — MIDAZOLAM HCL 2 MG/2ML IJ SOLN
INTRAMUSCULAR | Status: AC
  Filled 2023-03-04: qty 2

## 2023-03-04 MED ORDER — LIDOCAINE 2% (20 MG/ML) 5 ML SYRINGE
INTRAMUSCULAR | Status: DC | PRN
  Administered 2023-03-04: 60 mg via INTRAVENOUS

## 2023-03-04 MED ORDER — PROPOFOL 10 MG/ML IV BOLUS
INTRAVENOUS | Status: DC | PRN
  Administered 2023-03-04: 180 mg via INTRAVENOUS

## 2023-03-04 MED ORDER — OXYCODONE HCL 5 MG PO TABS: 10.0000 mg | ORAL_TABLET | ORAL | Status: DC | PRN

## 2023-03-04 MED ORDER — ONDANSETRON HCL 4 MG/2ML IJ SOLN
INTRAMUSCULAR | Status: DC | PRN
  Administered 2023-03-04: 4 mg via INTRAVENOUS

## 2023-03-04 MED ORDER — ACETAMINOPHEN 500 MG PO TABS
1000.0000 mg | ORAL_TABLET | Freq: Once | ORAL | Status: AC
  Administered 2023-03-04: 1000 mg via ORAL
  Filled 2023-03-04: qty 2

## 2023-03-04 MED ORDER — CHLORHEXIDINE GLUCONATE 0.12 % MT SOLN: 15.0000 mL | Freq: Once | OROMUCOSAL | Status: DC

## 2023-03-04 MED ORDER — FENTANYL CITRATE (PF) 250 MCG/5ML IJ SOLN
INTRAMUSCULAR | Status: AC
  Filled 2023-03-04: qty 5

## 2023-03-04 MED ORDER — POVIDONE-IODINE 10 % EX SWAB: 2.0000 | Freq: Once | CUTANEOUS | Status: DC

## 2023-03-04 MED ORDER — DEXAMETHASONE SODIUM PHOSPHATE 10 MG/ML IJ SOLN
INTRAMUSCULAR | Status: AC
  Filled 2023-03-04: qty 1

## 2023-03-04 MED ORDER — POTASSIUM CHLORIDE IN NACL 20-0.45 MEQ/L-% IV SOLN
INTRAVENOUS | Status: DC
  Filled 2023-03-04: qty 1000

## 2023-03-04 MED ORDER — OXYCODONE HCL 5 MG/5ML PO SOLN: 5.0000 mg | Freq: Once | ORAL | Status: DC | PRN

## 2023-03-04 MED ORDER — AMISULPRIDE (ANTIEMETIC) 5 MG/2ML IV SOLN: 10.0000 mg | Freq: Once | INTRAVENOUS | Status: DC | PRN

## 2023-03-04 MED ORDER — PHENYLEPHRINE 80 MCG/ML (10ML) SYRINGE FOR IV PUSH (FOR BLOOD PRESSURE SUPPORT)
PREFILLED_SYRINGE | INTRAVENOUS | Status: AC
  Filled 2023-03-04: qty 10

## 2023-03-04 MED ORDER — MIDAZOLAM HCL 2 MG/2ML IJ SOLN
INTRAMUSCULAR | Status: DC | PRN
  Administered 2023-03-04: 2 mg via INTRAVENOUS

## 2023-03-04 MED ORDER — MENTHOL 3 MG MT LOZG: 1.0000 | LOZENGE | OROMUCOSAL | Status: DC | PRN

## 2023-03-04 MED ORDER — EPHEDRINE 5 MG/ML INJ
INTRAVENOUS | Status: AC
  Filled 2023-03-04: qty 5

## 2023-03-04 MED ORDER — CEFAZOLIN IN SODIUM CHLORIDE 3-0.9 GM/100ML-% IV SOLN
3.0000 g | Freq: Three times a day (TID) | INTRAVENOUS | Status: AC
  Administered 2023-03-05: 3 g via INTRAVENOUS
  Filled 2023-03-04 (×2): qty 100

## 2023-03-04 MED ORDER — PHENOL 1.4 % MT LIQD: 1.0000 | OROMUCOSAL | Status: DC | PRN

## 2023-03-04 MED ORDER — SODIUM CHLORIDE 0.9% FLUSH
3.0000 mL | Freq: Two times a day (BID) | INTRAVENOUS | Status: DC
  Administered 2023-03-04 – 2023-03-07 (×6): 3 mL via INTRAVENOUS

## 2023-03-04 MED ORDER — PROPOFOL 10 MG/ML IV BOLUS
INTRAVENOUS | Status: AC
  Filled 2023-03-04: qty 20

## 2023-03-04 MED ORDER — CEFAZOLIN SODIUM-DEXTROSE 2-4 GM/100ML-% IV SOLN
INTRAVENOUS | Status: AC
  Filled 2023-03-04: qty 100

## 2023-03-04 MED ORDER — ROCURONIUM BROMIDE 10 MG/ML (PF) SYRINGE
PREFILLED_SYRINGE | INTRAVENOUS | Status: DC | PRN
  Administered 2023-03-04: 50 mg via INTRAVENOUS

## 2023-03-04 MED ORDER — LACTATED RINGERS IV SOLN: INTRAVENOUS | Status: DC

## 2023-03-04 MED ORDER — 0.9 % SODIUM CHLORIDE (POUR BTL) OPTIME
TOPICAL | Status: DC | PRN
  Administered 2023-03-04 (×2): 1000 mL

## 2023-03-04 MED ORDER — SODIUM CHLORIDE 0.9 % IV SOLN: 250.0000 mL | INTRAVENOUS | Status: DC

## 2023-03-04 MED ORDER — ORAL CARE MOUTH RINSE: 15.0000 mL | Freq: Once | OROMUCOSAL | Status: DC

## 2023-03-04 MED ORDER — HYDROMORPHONE HCL 1 MG/ML IJ SOLN
1.0000 mg | INTRAMUSCULAR | Status: DC | PRN
  Filled 2023-03-04: qty 1

## 2023-03-04 MED ORDER — CHLORHEXIDINE GLUCONATE 4 % EX SOLN: 60.0000 mL | Freq: Once | CUTANEOUS | Status: DC

## 2023-03-04 MED ORDER — RIVAROXABAN 20 MG PO TABS
20.0000 mg | ORAL_TABLET | Freq: Every day | ORAL | Status: DC
  Administered 2023-03-05 – 2023-03-06 (×2): 20 mg via ORAL
  Filled 2023-03-04 (×2): qty 1

## 2023-03-04 MED ORDER — ROCURONIUM BROMIDE 10 MG/ML (PF) SYRINGE
PREFILLED_SYRINGE | INTRAVENOUS | Status: AC
  Filled 2023-03-04: qty 10

## 2023-03-04 MED ORDER — METHOCARBAMOL 1000 MG/10ML IJ SOLN: 500.0000 mg | Freq: Four times a day (QID) | INTRAVENOUS | Status: DC | PRN

## 2023-03-04 MED ORDER — ONDANSETRON HCL 4 MG/2ML IJ SOLN: 4.0000 mg | Freq: Four times a day (QID) | INTRAMUSCULAR | Status: DC | PRN

## 2023-03-04 SURGICAL SUPPLY — 57 items
ADH SKN CLS APL DERMABOND .7 (GAUZE/BANDAGES/DRESSINGS) ×2
BAG COUNTER SPONGE SURGICOUNT (BAG) ×1 IMPLANT
BAG SPNG CNTER NS LX DISP (BAG)
CORD BIPOLAR FORCEPS 12FT (ELECTRODE) ×1 IMPLANT
COVER SURGICAL LIGHT HANDLE (MISCELLANEOUS) ×1 IMPLANT
DERMABOND ADVANCED .7 DNX12 (GAUZE/BANDAGES/DRESSINGS) IMPLANT
DRAPE HALF SHEET 40X57 (DRAPES) ×1 IMPLANT
DRAPE SURG 17X23 STRL (DRAPES) ×3 IMPLANT
DRSG MEPILEX POST OP 4X8 (GAUZE/BANDAGES/DRESSINGS) IMPLANT
DURAPREP 26ML APPLICATOR (WOUND CARE) ×1 IMPLANT
ELECT BLADE 4.0 EZ CLEAN MEGAD (MISCELLANEOUS)
ELECT CAUTERY BLADE 6.4 (BLADE) ×1 IMPLANT
ELECT REM PT RETURN 9FT ADLT (ELECTROSURGICAL) ×1
ELECTRODE BLDE 4.0 EZ CLN MEGD (MISCELLANEOUS) IMPLANT
ELECTRODE REM PT RTRN 9FT ADLT (ELECTROSURGICAL) ×1 IMPLANT
EVACUATOR 1/8 PVC DRAIN (DRAIN) IMPLANT
GAUZE SPONGE 4X4 12PLY STRL (GAUZE/BANDAGES/DRESSINGS) ×1 IMPLANT
GLOVE BIO SURGEON STRL SZ7.5 (GLOVE) ×2 IMPLANT
GLOVE BIOGEL PI IND STRL 8 (GLOVE) ×2 IMPLANT
GOWN STRL REUS W/ TWL LRG LVL3 (GOWN DISPOSABLE) ×1 IMPLANT
GOWN STRL REUS W/ TWL XL LVL3 (GOWN DISPOSABLE) ×1 IMPLANT
GOWN STRL REUS W/TWL 2XL LVL3 (GOWN DISPOSABLE) ×1 IMPLANT
GOWN STRL REUS W/TWL LRG LVL3 (GOWN DISPOSABLE) ×1
GOWN STRL REUS W/TWL XL LVL3 (GOWN DISPOSABLE)
HANDPIECE INTERPULSE COAX TIP (DISPOSABLE)
KIT BASIN OR (CUSTOM PROCEDURE TRAY) ×1 IMPLANT
KIT TURNOVER KIT B (KITS) ×1 IMPLANT
MANIFOLD NEPTUNE II (INSTRUMENTS) ×1 IMPLANT
NDL 22X1.5 STRL (OR ONLY) (MISCELLANEOUS) ×1 IMPLANT
NDL SPNL 18GX3.5 QUINCKE PK (NEEDLE) ×2 IMPLANT
NEEDLE 22X1.5 STRL (OR ONLY) (MISCELLANEOUS) IMPLANT
NEEDLE SPNL 18GX3.5 QUINCKE PK (NEEDLE) IMPLANT
NS IRRIG 1000ML POUR BTL (IV SOLUTION) ×1 IMPLANT
PACK LAMINECTOMY ORTHO (CUSTOM PROCEDURE TRAY) ×1 IMPLANT
PAD ARMBOARD 7.5X6 YLW CONV (MISCELLANEOUS) ×2 IMPLANT
PATTIES SURGICAL .5 X.5 (GAUZE/BANDAGES/DRESSINGS) IMPLANT
PATTIES SURGICAL .5 X1 (DISPOSABLE) IMPLANT
SET HNDPC FAN SPRY TIP SCT (DISPOSABLE) IMPLANT
SPONGE SURGIFOAM ABS GEL 100 (HEMOSTASIS) IMPLANT
STRIP CLOSURE SKIN 1/2X4 (GAUZE/BANDAGES/DRESSINGS) ×1 IMPLANT
STRIP CLOSURE SKIN 1/4X4 (GAUZE/BANDAGES/DRESSINGS) IMPLANT
SUT ETHILON 2 0 PSLX (SUTURE) IMPLANT
SUT MNCRL AB 3-0 PS2 18 (SUTURE) IMPLANT
SUT VIC AB 0 CT1 27 (SUTURE)
SUT VIC AB 0 CT1 27XBRD ANBCTR (SUTURE) ×1 IMPLANT
SUT VIC AB 1 CT1 36 (SUTURE) IMPLANT
SUT VIC AB 2-0 CT1 (SUTURE) IMPLANT
SUT VIC AB 2-0 CT1 27 (SUTURE) ×1
SUT VIC AB 2-0 CT1 TAPERPNT 27 (SUTURE) ×1 IMPLANT
SWAB COLLECTION DEVICE MRSA (MISCELLANEOUS) IMPLANT
SWAB CULTURE ESWAB REG 1ML (MISCELLANEOUS) IMPLANT
SYR BULB IRRIG 60ML STRL (SYRINGE) ×1 IMPLANT
SYR CONTROL 10ML LL (SYRINGE) ×1 IMPLANT
TOWEL GREEN STERILE (TOWEL DISPOSABLE) ×1 IMPLANT
TOWEL GREEN STERILE FF (TOWEL DISPOSABLE) ×1 IMPLANT
WATER STERILE IRR 1000ML POUR (IV SOLUTION) ×1 IMPLANT
YANKAUER SUCT BULB TIP NO VENT (SUCTIONS) ×1 IMPLANT

## 2023-03-04 NOTE — Transfer of Care (Signed)
Immediate Anesthesia Transfer of Care Note  Patient: Monique Clark  Procedure(s) Performed: EVACUATION HEMATOMA SEROMA WITH RECLOSURE LUMBAR INCISION (Back)  Patient Location: PACU  Anesthesia Type:General  Level of Consciousness: awake and oriented  Airway & Oxygen Therapy: Patient Spontanous Breathing and Patient connected to face mask oxygen  Post-op Assessment: Report given to RN and Post -op Vital signs reviewed and stable  Post vital signs: Reviewed and stable  Last Vitals:  Vitals Value Taken Time  BP 118/78 (89) 03/04/23 1745  Temp    Pulse 60 03/04/23 1744  Resp 12 03/04/23 1744  SpO2 98 % 03/04/23 1744  Vitals shown include unvalidated device data.  Last Pain:  Vitals:   03/04/23 1452  TempSrc: Oral  PainSc: 4       Patients Stated Pain Goal: 0 (02/28/23 0818)  Complications: There were no known notable events for this encounter.

## 2023-03-04 NOTE — Progress Notes (Signed)
Patient ID: Monique Clark, female   DOB: Feb 04, 1966, 57 y.o.   MRN: [id number]   Subjective: 5 Days Post-Op Procedure(s) (LRB): L4-5 LUMBAR DECOMPRESSION, MICRODISCECTOMY (N/A) Patient reports pain as minimal.  Still drainage on dressing . Surgery this afternoon for reclosure.   Objective: Vital signs in last 24 hours: Temp:  [98.2 F (36.8 C)-98.6 F (37 C)] 98.2 F (36.8 C) (06/05 0745) Pulse Rate:  [55-62] 58 (06/05 0745) Resp:  [16-20] 18 (06/05 0745) BP: (103-125)/(47-50) 125/49 (06/05 0745) SpO2:  [93 %-96 %] 96 % (06/05 0745)  Intake/Output from previous day: 06/04 0701 - 06/05 0700 In: 678.4 [P.O.:600; I.V.:78.4] Out: 1900 [Urine:1900] Intake/Output this shift: No intake/output data recorded.  Recent Labs    03/01/23 1016  HGB 10.5*   Recent Labs    03/01/23 1016  WBC 12.4*  RBC 3.76*  HCT 34.2*  PLT 226   Recent Labs    03/01/23 1016  NA 135  K 3.7  CL 102  CO2 26  BUN 11  CREATININE 0.60  GLUCOSE 121*  CALCIUM 8.4*   No results for input(s): "LABPT", "INR" in the last 72 hours.  Neurologically intact No results found.  Assessment/Plan: 5 Days Post-Op Procedure(s) (LRB): L4-5 LUMBAR DECOMPRESSION, MICRODISCECTOMY (N/A) Plan:  NPO for OR this afternoon.   [name] 03/04/2023, 8:54 AM

## 2023-03-04 NOTE — Interval H&P Note (Signed)
History and Physical Interval Note:  03/04/2023 3:48 PM  Monique Clark  has presented today for surgery, with the diagnosis of POST OP LUMBAR SEROMA SURGERY.  The various methods of treatment have been discussed with the patient and family. After consideration of risks, benefits and other options for treatment, the patient has consented to  Procedure(s): EVACUATION HEMATOMA SEROMA WITH RECLOSURE LUMBAR INCISION (N/A) as a surgical intervention.  The patient's history has been reviewed, patient examined, no change in status, stable for surgery.  I have reviewed the patient's chart and labs.  Questions were answered to the patient's satisfaction.     [name]

## 2023-03-04 NOTE — H&P (View-Only) (Signed)
Patient ID: Monique Clark, female   DOB: 08/28/1966, 57 y.o.   MRN: [id number]   Subjective: 5 Days Post-Op Procedure(s) (LRB): L4-5 LUMBAR DECOMPRESSION, MICRODISCECTOMY (N/A) Patient reports pain as minimal.  Still drainage on dressing . Surgery this afternoon for reclosure.   Objective: Vital signs in last 24 hours: Temp:  [98.2 F (36.8 C)-98.6 F (37 C)] 98.2 F (36.8 C) (06/05 0745) Pulse Rate:  [55-62] 58 (06/05 0745) Resp:  [16-20] 18 (06/05 0745) BP: (103-125)/(47-50) 125/49 (06/05 0745) SpO2:  [93 %-96 %] 96 % (06/05 0745)  Intake/Output from previous day: 06/04 0701 - 06/05 0700 In: 678.4 [P.O.:600; I.V.:78.4] Out: 1900 [Urine:1900] Intake/Output this shift: No intake/output data recorded.  Recent Labs    03/01/23 1016  HGB 10.5*   Recent Labs    03/01/23 1016  WBC 12.4*  RBC 3.76*  HCT 34.2*  PLT 226   Recent Labs    03/01/23 1016  NA 135  K 3.7  CL 102  CO2 26  BUN 11  CREATININE 0.60  GLUCOSE 121*  CALCIUM 8.4*   No results for input(s): "LABPT", "INR" in the last 72 hours.  Neurologically intact No results found.  Assessment/Plan: 5 Days Post-Op Procedure(s) (LRB): L4-5 LUMBAR DECOMPRESSION, MICRODISCECTOMY (N/A) Plan:  NPO for OR this afternoon.   Monique Clark C Aeon Kessner 03/04/2023, 8:54 AM      

## 2023-03-04 NOTE — Progress Notes (Signed)
ANTICOAGULATION CONSULT NOTE - Follow Up Consult  Pharmacy Consult for restart xarelto post-op Indication: atrial fibrillation  Allergies  Allergen Reactions   Chlorhexidine Rash   Tetanus-Diphtheria Toxoids Td Swelling    angioedema   Nizatidine Hives    Axid   Promethazine Other (See Comments)    High doses causes confusion   Tetanus Toxoids Other (See Comments)    angioedema    Patient Measurements: Height: 5\' 5"  (165.1 cm) Weight: 131.5 kg (290 lb) IBW/kg (Calculated) : 57   Vital Signs: Temp: 98.6 F (37 C) (06/05 1815) Temp Source: Oral (06/05 1452) BP: 122/58 (06/05 1815) Pulse Rate: 60 (06/05 1815)  Labs: No results for input(s): "HGB", "HCT", "PLT", "APTT", "LABPROT", "INR", "HEPARINUNFRC", "HEPRLOWMOCWT", "CREATININE", "CKTOTAL", "CKMB", "TROPONINIHS" in the last 72 hours.  Estimated Creatinine Clearance: 106.3 mL/min (by C-G formula based on SCr of 0.6 mg/dL).   Medications:  Medications Prior to Admission  Medication Sig Dispense Refill Last Dose   calcium carbonate (TUMS) 500 MG chewable tablet Chew 1 tablet by mouth 3 (three) times daily.   02/26/2023   Cholecalciferol (VITAMIN D3) 1.25 MG (50000 UT) CAPS Take 1 capsule (1.25 mg total) by mouth 2 (two) times a week as directed (Patient taking differently: Take 50,000 Units by mouth every 14 (fourteen) days. Mondays) 8 capsule 5 Past Week   diphenhydrAMINE (BENADRYL) 25 MG tablet Take 25-50 mg by mouth See admin instructions. Take 25 mg by mouth as needed for allergies or itching and take 50 mg by mouth with Hizentra infusion   02/26/2023   docusate sodium (COLACE) 100 MG capsule Take 200 mg by mouth at bedtime.   02/26/2023   DULoxetine (CYMBALTA) 60 MG capsule Take 1 capsule (60 mg total) by mouth daily. Annual appt due in April must see provider for future refills (Patient taking differently: Take 60 mg by mouth at bedtime. Annual appt due in April must see provider for future refills) 30 capsule 0 02/26/2023    famotidine (PEPCID) 20 MG tablet Take 20 mg by mouth daily as needed for heartburn or indigestion.   02/26/2023   flecainide (TAMBOCOR) 100 MG tablet Take 1 tablet (100 mg total) by mouth every 12 (twelve) hours. (Patient taking differently: Take 100 mg by mouth at bedtime.) 180 tablet 1 02/26/2023   gabapentin (NEURONTIN) 100 MG capsule Take 1 capsule by mouth every night, may increase to 1 capsule by mouth 3 times a day if needed. (Patient taking differently: Take 300 mg by mouth at bedtime.) 90 capsule 0 02/26/2023   HYDROcodone-acetaminophen (NORCO/VICODIN) 5-325 MG tablet Take 1 tablet by mouth every 6 (six) hours as needed for moderate pain. 20 tablet 0 02/27/2023 at 0430   ibuprofen (ADVIL) 200 MG tablet Take 800 mg by mouth 2 (two) times daily as needed for mild pain or moderate pain.   Past Month   Immune Globulin, Human, (HIZENTRA) 4 GM/20ML SOLN 36 grams ( ) subcutaneously every 2 weeks via pump (Patient taking differently: Inject 36 g into the skin every 14 (fourteen) days.) 360 mL 11 02/25/2023   Multiple Vitamin (MULTIVITAMIN WITH MINERALS) TABS tablet Take 1 tablet by mouth at bedtime.   Past Week   nebivolol (BYSTOLIC) 5 MG tablet Take 1 tablet (5 mg total) by mouth daily. (Patient taking differently: Take 5 mg by mouth at bedtime.) 30 tablet 4 02/26/2023 at 2200   rivaroxaban (XARELTO) 20 MG TABS tablet Take 1 tablet by mouth every day (Patient taking differently: Take 20 mg by mouth every  evening.) 90 tablet 4 02/24/2023   zolpidem (AMBIEN) 5 MG tablet Take 1 tablet (5 mg total) by mouth at bedtime as needed for sleep. (Patient taking differently: Take 5 mg by mouth at bedtime.) 30 tablet 5 02/26/2023   acetaminophen (TYLENOL) 500 MG tablet Take 500-1,000 mg by mouth every 6 (six) hours as needed (pain.).   More than a month   albuterol (VENTOLIN HFA) 108 (90 Base) MCG/ACT inhaler Inhale 1 puff into the lungs every 6 (six) hours as needed for shortness of breath or wheezing.   More than a  month   NON FORMULARY Pt uses a cpap nightly w/2 liters of oxygen      predniSONE (DELTASONE) 5 MG tablet Take 1.5 tablets (7.5 mg total) by mouth daily for 1 month, then take 1 tablet by mouth daily thereafter (Patient not taking: Reported on 01/09/2023) 75 tablet 0 Not Taking   Sarilumab (KEVZARA) 200 MG/1. SOAJ Inject 200 mg into the skin every 14 (fourteen) days. 2.28 mL 2 02/09/2023    Assessment: 40 YOF s/p exploratory and reclsoure L4-5 redo microdiscectomy requiring restart of AC for AF.   Goal of Therapy: Monitor platelets by anticoagulation protocol: Yes   Plan:  Restart xarelto 20 mg po with supper on 6/6 No new meds started that show any significant drug-drug interactions Pharmacy will sign off of consult. Thank you   Greta Doom BS, PharmD, BCPS Clinical Pharmacist 03/04/2023 6:32 PM  Contact: [phone] after 3 PM  "Be curious, not judgmental..." -Debbora Dus

## 2023-03-04 NOTE — TOC CM/SW Note (Signed)
Transition of Care [facility]) - Inpatient Brief Assessment   Patient Details  Name: Brock Barten MRN: [id number] Date of Birth: [birth year]  Transition of Care [facility]) CM/SW Contact:    Epifanio Lesches, RN Phone Number: 03/04/2023, 10:41 AM   Clinical Narrative:   - s/p L4-5 LUMBAR DECOMPRESSION, MICRODISCECTOMY 5/31   Plan: Surgery this afternoon for re-closure, pt with seroma likely CSF fluid from tiny bleb   TOC team  following for need...   Transition of Care Asessment: Insurance and Status: Insurance coverage has been reviewed Patient has primary care physician: Yes Home environment has been reviewed: From with husband/family Prior level of function:: PTA independent with ADL's Prior/Current Home Services: No current home services Social Determinants of Health Reivew: SDOH reviewed no interventions necessary Readmission risk has been reviewed: No Transition of care needs: no transition of care needs at this time

## 2023-03-04 NOTE — Progress Notes (Signed)
Patient places themselves on home cpap for sleeping.

## 2023-03-04 NOTE — Op Note (Signed)
Pre and postop diagnosis: Postop redo microdiscectomy seroma/hematoma with partial wound dehiscence.  Procedure: Exploration and reclosure L4-5 redo microdiscectomy.  Surgeon: Annell Greening, MD    Anesthesia GA OT.  Procedure after induction of general anesthesia intubation preoperative Ancef prophylaxis 3 g due to patient body habitus patient was transferred to chest rolls prone position carefully secured putting gastric straps on to hold her due to her body habitus.  Back was prepped with DuraPrep area squared with towels Betadine Steri-Drape was applied and laminectomy sheet and drapes.  Patient had history of right L4-5 microdiscectomy in 2022.  She has had repeat epidural injections after she had recurrent disc herniation more so on the opposite left side.  She had a right hemilaminectomy and on 531/24 she underwent microdiscectomy left side hemilaminectomy and IntraOp tiny bleb occurred with no CSF leak.  This was through the pia and a single suture was placed and some DuraSeal dipped over the top and there was no leak to 30 cm bagging by the CRNA.  She was closed tightly septic or closure and Dermabond on the skin with Steri-Strips.  Postop day 1 she got up had significant headache was Down for several days with persistent headache.  I discussed her case over the of the spine surgeons including neurosurgery staff for recommendations and she is brought back for repeat closure with interrupted nylon vertical mattress sutures attempted get the since the skin healed and stop the leak.  Incision was opened initially looking at the incision squeezing pushing there was no drainage.  Inferior aspect where she had a 3 mm area of the incision that is separated was open with the tip of mosquito and immediate large amount of serous fluid was evacuated.  Wound was then opened up all the way down and some clot and serous fluid was removed there was no purulent material.  Some areas were scraped with a curette  and fascia and pickups sharp scalpel removing any areas where there could be concern that possibly the tissue could be deep vitalized.  Multiple sutures removed which were Vicryl 0 and 2-0 Vicryl's.  Copious irrigation 2 L was performed followed by repeat closure with interrupted #1 Vicryl sutures in the fascia.  Looks like the upper portion of the lumbar fascia and pulled loose despite multiple sutures and wide bites and then in the subtendinous tissue it was the inferior aspect of the incision where she had small that it been leaking.  Reapproximation subcutaneous tissue with 0 and 2-0 Vicryl's were performed 2 oh reapproximation just underneath the skin and then multiple interrupted 2-0 nylon vertical mattress sutures were placed interrupted followed by Dermabond and putting Steri-Strips in between the sutures followed by second Dermabond over the top followed by Mepilex sealing it.  Patient tolerated procedure well transferred care room in stable condition.  Postop plan patient to stay down for 48 hours then will slowly immobilizer.

## 2023-03-04 NOTE — Anesthesia Procedure Notes (Signed)
Procedure Name: Intubation Date/Time: 03/04/2023 4:27 PM  Performed by: Darlina Guys, CRNAPre-anesthesia Checklist: Patient identified, Emergency Drugs available, Suction available and Patient being monitored Patient Re-evaluated:Patient Re-evaluated prior to induction Oxygen Delivery Method: Circle system utilized Preoxygenation: Pre-oxygenation with 100% oxygen Induction Type: IV induction Ventilation: Mask ventilation without difficulty Laryngoscope Size: Glidescope and 3 Grade View: Grade I Tube size: 7.0 mm Number of attempts: 1 Airway Equipment and Method: Stylet and Video-laryngoscopy Placement Confirmation: ETT inserted through vocal cords under direct vision, positive ETCO2 and breath sounds checked- equal and bilateral Secured at: 21 cm Tube secured with: Tape Dental Injury: Teeth and Oropharynx as per pre-operative assessment

## 2023-03-04 NOTE — Anesthesia Preprocedure Evaluation (Addendum)
Anesthesia Evaluation  Patient identified by MRN, date of birth, ID band Patient awake    Reviewed: Allergy & Precautions, H&P , NPO status , Patient's Chart, lab work & pertinent test results  History of Anesthesia Complications (+) PONV and history of anesthetic complications  Airway Mallampati: II   Neck ROM: full    Dental   Pulmonary asthma , sleep apnea, Continuous Positive Airway Pressure Ventilation and Oxygen sleep apnea , former smoker   breath sounds clear to auscultation       Cardiovascular hypertension, Pt. on medications and Pt. on home beta blockers + dysrhythmias Atrial Fibrillation  Rhythm:regular Rate:Normal   POTS    Neuro/Psych  Headaches  negative psych ROS   GI/Hepatic Neg liver ROS,GERD  Medicated,, S/p gastric sleeve    Endo/Other  negative endocrine ROS    Renal/GU negative Renal ROS     Musculoskeletal  (+) Arthritis ,    Abdominal   Peds  Hematology  (+) Blood dyscrasia, anemia  On xarelto NHL CVID    Anesthesia Other Findings   Reproductive/Obstetrics                             Anesthesia Physical Anesthesia Plan  ASA: 3  Anesthesia Plan: General   Post-op Pain Management: Tylenol PO (pre-op)*   Induction: Intravenous  PONV Risk Score and Plan: 4 or greater and Treatment may vary due to age or medical condition, Ondansetron, Dexamethasone and Midazolam  Airway Management Planned: Oral ETT  Additional Equipment: None  Intra-op Plan:   Post-operative Plan: Extubation in OR  Informed Consent: I have reviewed the patients History and Physical, chart, labs and discussed the procedure including the risks, benefits and alternatives for the proposed anesthesia with the patient or authorized representative who has indicated his/her understanding and acceptance.     Dental advisory given  Plan Discussed with: CRNA, Anesthesiologist and  Surgeon  Anesthesia Plan Comments:        Anesthesia Quick Evaluation

## 2023-03-05 ENCOUNTER — Encounter (HOSPITAL_COMMUNITY): Payer: Self-pay | Admitting: Orthopaedic Surgery

## 2023-03-05 ENCOUNTER — Ambulatory Visit: Payer: Commercial Managed Care - PPO | Admitting: Internal Medicine

## 2023-03-05 NOTE — Progress Notes (Signed)
   03/04/23 2100  BiPAP/CPAP/SIPAP  BiPAP/CPAP/SIPAP Pt Type Adult  BiPAP/CPAP/SIPAP  (Home cpap)  Flow Rate 2 lpm  Patient Home Equipment Yes

## 2023-03-05 NOTE — Anesthesia Postprocedure Evaluation (Signed)
Anesthesia Post Note  Patient: Illiana Khoshaba  Procedure(s) Performed: EVACUATION HEMATOMA SEROMA WITH RECLOSURE LUMBAR INCISION (Back)     Patient location during evaluation: PACU Anesthesia Type: General Level of consciousness: awake and alert Pain management: pain level controlled Vital Signs Assessment: post-procedure vital signs reviewed and stable Respiratory status: spontaneous breathing, nonlabored ventilation, respiratory function stable and patient connected to nasal cannula oxygen Cardiovascular status: blood pressure returned to baseline and stable Postop Assessment: no apparent nausea or vomiting Anesthetic complications: no   There were no known notable events for this encounter.  Last Vitals:  Vitals:   03/05/23 0807 03/05/23 1219  BP: 99/75 110/64  Pulse: (!) 58 (!) 57  Resp: 18 18  Temp: 37.2 C 36.8 C  SpO2: 97% 98%    Last Pain:  Vitals:   03/05/23 1219  TempSrc: Oral  PainSc:                  Nelle Don Novie Maggio

## 2023-03-05 NOTE — Plan of Care (Signed)
  Problem: Activity: Goal: Ability to avoid complications of mobility impairment will improve Outcome: Progressing   Problem: Pain Management: Goal: Pain level will decrease Outcome: Progressing   Problem: Nutrition: Goal: Adequate nutrition will be maintained Outcome: Progressing

## 2023-03-05 NOTE — Progress Notes (Signed)
Patient ID: Monique Clark, female   DOB: 05-Apr-1966, 57 y.o.   MRN: [id number]   Subjective: 1 Day Post-Op Procedure(s) (LRB): EVACUATION HEMATOMA SEROMA WITH RECLOSURE LUMBAR INCISION (N/A) Patient reports pain as mild.  No headache.   Objective: Vital signs in last 24 hours: Temp:  [98 F (36.7 C)-98.6 F (37 C)] 98.6 F (37 C) (06/05 1958) Pulse Rate:  [54-62] 60 (06/05 1958) Resp:  [11-18] 17 (06/05 1958) BP: (103-124)/(49-96) 103/59 (06/05 1958) SpO2:  [94 %-97 %] 94 % (06/05 1958) Weight:  [131.5 kg] 131.5 kg (06/05 1503)  Intake/Output from previous day: 06/05 0701 - 06/06 0700 In: 976.9 [I.V.:776.9; IV Piggyback:200] Out: 1420 [Urine:1400; Blood:20] Intake/Output this shift: No intake/output data recorded.  No results for input(s): "HGB" in the last 72 hours. No results for input(s): "WBC", "RBC", "HCT", "PLT" in the last 72 hours. No results for input(s): "NA", "K", "CL", "CO2", "BUN", "CREATININE", "GLUCOSE", "CALCIUM" in the last 72 hours. No results for input(s): "LABPT", "INR" in the last 72 hours.  Neurologically intact  Dressing is dry No results found.  Assessment/Plan: 1 Day Post-Op Procedure(s) (LRB): EVACUATION HEMATOMA SEROMA WITH RECLOSURE LUMBAR INCISION (N/A) Plan:  still at bedrest , flat. Leave foley.   [name] 03/05/2023, 8:07 AM

## 2023-03-05 NOTE — Progress Notes (Signed)
Pt not ready to d/c foley cath before talking to MD.

## 2023-03-06 ENCOUNTER — Other Ambulatory Visit: Payer: Self-pay

## 2023-03-06 NOTE — Progress Notes (Signed)
Patient ID: Monique Clark, female   DOB: 01-08-1966, 57 y.o.   MRN: [id number]   Subjective: 2 Days Post-Op Procedure(s) (LRB): EVACUATION HEMATOMA SEROMA WITH RECLOSURE LUMBAR INCISION (N/A) Patient reports pain as mild.    Objective: Vital signs in last 24 hours: Temp:  [98.3 F (36.8 C)-98.9 F (37.2 C)] 98.3 F (36.8 C) (06/06 2049) Pulse Rate:  [57-59] 59 (06/06 2049) Resp:  [18-20] 20 (06/06 2049) BP: (99-121)/(49-75) 121/49 (06/06 2049) SpO2:  [97 %-98 %] 98 % (06/06 2049)  Intake/Output from previous day: 06/06 0701 - 06/07 0700 In: 240 [P.O.:240] Out: 450 [Urine:450] Intake/Output this shift: No intake/output data recorded.  No results for input(s): "HGB" in the last 72 hours. No results for input(s): "WBC", "RBC", "HCT", "PLT" in the last 72 hours. No results for input(s): "NA", "K", "CL", "CO2", "BUN", "CREATININE", "GLUCOSE", "CALCIUM" in the last 72 hours. No results for input(s): "LABPT", "INR" in the last 72 hours.  Neurologically intact,  dressing dry , changed , dry.  No results found.  Assessment/Plan: 2 Days Post-Op Procedure(s) (LRB): EVACUATION HEMATOMA SEROMA WITH RECLOSURE LUMBAR INCISION (N/A) Plan:  nurse to raise bed to 45 degrees, if no headache after a couple hrs may be upright sitting . Check dressing , if fine , PT consult ambulate to BR and OK to d/c foley.   [name] 03/06/2023, 7:24 AM

## 2023-03-06 NOTE — Progress Notes (Signed)
Patient ID: Monique Clark, female   DOB: 1966/06/17, 57 y.o.   MRN: [id number] Sitting , no headache  . Progress up and then PT ambulation.

## 2023-03-06 NOTE — Evaluation (Signed)
Physical Therapy Evaluation Patient Details Name: Monique Clark MRN: [id number] DOB: Nov 16, 1965 Today's Date: 03/06/2023  History of Present Illness  57 y.o. female admitted and underwent L4-5 decompression with left microdiscectomy 5/31. EVACUATION HEMATOMA SEROMA WITH RECLOSURE LUMBAR INCISION 6/5. PMHx: Abscess  Arthritis  Atrial fibrillation , Cardiac arrhythmia due to congenital heart disease, CVID (,Diverticulosis,Elevated cholesterol, GERD, High blood pressure, Non Hodgkin's lymphoma 1998,Obesity, Personal history of gestational diabetes    , PMR (polymyalgia rheumatica) (HCC)    , Pneumonia, PONV, POTS , Pre-diabetes UTI (urinary tract infection)  Clinical Impression  Patient is s/p above surgery resulting in functional limitations due to the deficits listed below (see PT Problem List). Mobilized out of bed with supervision. Ambulates with RW for support, 30 feet, limited for first time but will encourage to increase distances with staff. Denies headache. Back pain without radicular symptoms.  Will follow and progress as tolerated during admission. Good family support at home. Anticipate safe for d/c from a mobility standpoint tomorrow after PT follow-up if cleared by medical/surgical team. Patient will benefit from acute skilled PT to increase their independence and safety with mobility to facilitate discharge.        Recommendations for follow up therapy are one component of a multi-disciplinary discharge planning process, led by the attending physician.  Recommendations may be updated based on patient status, additional functional criteria and insurance authorization.     Assistance Recommended at Discharge PRN  Patient can return home with the following  A little help with walking and/or transfers;A little help with bathing/dressing/bathroom;Assistance with cooking/housework;Assist for transportation;Help with stairs or ramp for entrance    Equipment Recommendations Rolling walker (2  wheels)  Recommendations for Other Services       Functional Status Assessment Patient has had a recent decline in their functional status and demonstrates the ability to make significant improvements in function in a reasonable and predictable amount of time.     Precautions / Restrictions Precautions Precautions: Back Precaution Comments: Reviewed precautions. pt Recalls 3/3 Required Braces or Orthoses:  (none) Restrictions Weight Bearing Restrictions: No      Mobility  Bed Mobility Overal bed mobility: Modified Independent             General bed mobility comments: Familiar with log roll, no assist in and out of bed. Lying on stomach when PT entered room, states surgeon has said this is okay.    Transfers Overall transfer level: Needs assistance Equipment used: Rolling walker (2 wheels) Transfers: Sit to/from Stand Sit to Stand: Supervision           General transfer comment: Supervision for safety, effortful to rise from bed but without physical assist.    Ambulation/Gait Ambulation/Gait assistance: Supervision Gait Distance (Feet): 30 Feet Assistive device: Rolling walker (2 wheels) Gait Pattern/deviations: Step-through pattern, Decreased stride length, Wide base of support Gait velocity: decr Gait velocity interpretation: <1.31 ft/sec, indicative of household ambulator   General Gait Details: Educated on AD use with RW, adjusted appropriately. Demonstrates good control with device. Minor drift but able to self correct. No buckling noted, good foot clearance. Supervision for safety. Distance limited for first time mobilizing and will progress as tolerated. Denies headache. No radicular symptoms.  Stairs            Wheelchair Mobility    Modified Rankin (Stroke Patients Only)       Balance Overall balance assessment: Needs assistance Sitting-balance support: No upper extremity supported, Feet supported Sitting balance-Leahy Scale: Good  Standing balance support: No upper extremity supported, During functional activity Standing balance-Leahy Scale: Fair Standing balance comment: Prefers UE support                             Pertinent Vitals/Pain Pain Assessment Pain Assessment: 0-10 Pain Score: 5  Pain Location: back Pain Descriptors / Indicators: Operative site guarding Pain Intervention(s): Monitored during session, Repositioned, Limited activity within patient's tolerance    Home Living Family/patient expects to be discharged to:: Private residence Living Arrangements: Spouse/significant other;Children Available Help at Discharge: Family;Available 24 hours/day Type of Home: House Home Access: Stairs to enter Entrance Stairs-Rails: Right Entrance Stairs-Number of Steps: 2   Home Layout: Able to live on main level with bedroom/bathroom Home Equipment: Toilet riser;Cane - single point;Wheelchair - manual;Shower seat      Prior Function Prior Level of Function : Independent/Modified Independent;Working/employed;Driving             Mobility Comments: using rollator for longer distances ADLs Comments: reports independent with modification     Hand Dominance   Dominant Hand: Right    Extremity/Trunk Assessment   Upper Extremity Assessment Upper Extremity Assessment: Defer to OT evaluation    Lower Extremity Assessment Lower Extremity Assessment: Overall WFL for tasks assessed       Communication   Communication: No difficulties  Cognition Arousal/Alertness: Awake/alert Behavior During Therapy: WFL for tasks assessed/performed Overall Cognitive Status: Within Functional Limits for tasks assessed                                          General Comments General comments (skin integrity, edema, etc.): Reviewed precautions.    Exercises     Assessment/Plan    PT Assessment Patient needs continued PT services  PT Problem List Decreased strength;Decreased  activity tolerance;Decreased mobility;Decreased balance;Decreased knowledge of use of DME;Decreased knowledge of precautions;Obesity;Pain       PT Treatment Interventions DME instruction;Gait training;Stair training;Functional mobility training;Therapeutic activities;Therapeutic exercise;Balance training;Neuromuscular re-education;Patient/family education;Modalities    PT Goals (Current goals can be found in the Care Plan section)  Acute Rehab PT Goals Patient Stated Goal: Get well PT Goal Formulation: With patient Time For Goal Achievement: 03/20/23 Potential to Achieve Goals: Good    Frequency Min 4X/week     Co-evaluation               AM-PAC PT "6 Clicks" Mobility  Outcome Measure Help needed turning from your back to your side while in a flat bed without using bedrails?: None Help needed moving from lying on your back to sitting on the side of a flat bed without using bedrails?: None Help needed moving to and from a bed to a chair (including a wheelchair)?: A Little Help needed standing up from a chair using your arms (e.g., wheelchair or bedside chair)?: A Little Help needed to walk in hospital room?: A Little Help needed climbing 3-5 steps with a railing? : A Little 6 Click Score: 20    End of Session   Activity Tolerance: Patient tolerated treatment well Patient left: in bed;with call bell/phone within reach Nurse Communication: Mobility status PT Visit Diagnosis: Other abnormalities of gait and mobility (R26.89);Difficulty in walking, not elsewhere classified (R26.2);Pain Pain - part of body:  (back)    Time: 0932-6712 PT Time Calculation (min) (ACUTE ONLY): 11 min   Charges:  PT Evaluation $PT Eval Low Complexity: 1 Low          Kathlyn [place], PT, DPT [facility]  Re[facility] Services Physical Therapist Office: [phone] Website: [email or web address]   Berton Mount 03/06/2023, 4:18 PM

## 2023-03-06 NOTE — Progress Notes (Addendum)
Patient ID: Monique Clark, female   DOB: 10-30-1965, 57 y.o.   MRN: [id number] Up ambulating with PT, to BR for BM. No HA. Plan discharge in AM and ROV in about 10 days . If she has drainage Dr. Christell Constant is coving for me next week and I have also reviewed case with Dr. Jordan Likes who said he would assume care if needed. . Will fill out discharge info for AM .   Office ROV 03/24/23 with Dr. Ophelia Charter.

## 2023-03-07 ENCOUNTER — Other Ambulatory Visit (HOSPITAL_COMMUNITY): Payer: Self-pay

## 2023-03-07 LAB — CBC
HCT: 34.6 % — ABNORMAL LOW (ref 36.0–46.0)
Hemoglobin: 11 g/dL — ABNORMAL LOW (ref 12.0–15.0)
MCH: 28.4 pg (ref 26.0–34.0)
MCHC: 31.8 g/dL (ref 30.0–36.0)
MCV: 89.2 fL (ref 80.0–100.0)
Platelets: 309 10*3/uL (ref 150–400)
RBC: 3.88 MIL/uL (ref 3.87–5.11)
RDW: 17.3 % — ABNORMAL HIGH (ref 11.5–15.5)
WBC: 13.1 10*3/uL — ABNORMAL HIGH (ref 4.0–10.5)
nRBC: 0 % (ref 0.0–0.2)

## 2023-03-07 NOTE — Progress Notes (Signed)
Patient ID: Monique Clark, female   DOB: April 11, 1966, 57 y.o.   MRN: [id number] The patient is awake and alert this morning.  She is in a good mood overall and feels confident about being discharged to home today.  I did look at her back/lumbar spine dressing and it is clean and dry.  She has good function from a neurologic standpoint in her bilateral lower extremities.  We both feel that going home today will be fine.

## 2023-03-07 NOTE — Progress Notes (Signed)
Physical Therapy Treatment Patient Details Name: Monique Clark MRN: [id number] DOB: [birth year] Today's Date: 03/07/2023   History of Present Illness 57 y.o. female admitted and underwent L4-5 decompression with left microdiscectomy 5/31. EVACUATION HEMATOMA SEROMA WITH RECLOSURE LUMBAR INCISION 6/5. PMHx: Abscess  Arthritis  Atrial fibrillation , Cardiac arrhythmia due to congenital heart disease, CVID (,Diverticulosis,Elevated cholesterol, GERD, High blood pressure, Non Hodgkin's lymphoma 1998,Obesity, Personal history of gestational diabetes    , PMR (polymyalgia rheumatica) (HCC)    , Pneumonia, PONV, POTS , Pre-diabetes UTI (urinary tract infection)    PT Comments    Great progress, ambulating at supervision level 130 feet with RW for light support. Safely navigates stairs similar to home set-up with single rail. No evidence of LE buckling. Denies radicular symptoms. Reviewed precautions and safety awareness with ADLs. Adequate for d/c from mobility standpoint when cleared by MD. Will follow until d/c.    Recommendations for follow up therapy are one component of a multi-disciplinary discharge planning process, led by the attending physician.  Recommendations may be updated based on patient status, additional functional criteria and insurance authorization.         PRN   A little help with walking and/or transfers;A little help with bathing/dressing/bathroom;Assistance with cooking/housework;Assist for transportation;Help with stairs or ramp for entrance    Rolling walker (2 wheels)            Precautions Precautions: Back Precaution Comments: Reviewed precautions. pt Recalls 3/3 Required Braces or Orthoses:  (none) Restrictions Weight Bearing Restrictions: No     Mobility  Bed Mobility Overal bed mobility: Modified Independent             General bed mobility comments: Familiar with log roll, no assist in and out of bed.    Transfers Overall transfer level:  Needs assistance Equipment used: Rolling walker (2 wheels) Transfers: Sit to/from Stand Sit to Stand: Supervision           General transfer comment: Supervision for safety, effortful to rise from bed but without physical assist. Stable with RW once upright    Ambulation/Gait Ambulation/Gait assistance: Supervision Gait Distance (Feet): 130 Feet Assistive device: Rolling walker (2 wheels) Gait Pattern/deviations: Step-through pattern, Decreased stride length, Wide base of support Gait velocity: decr Gait velocity interpretation: <1.8 ft/sec, indicate of risk for recurrent falls   General Gait Details: Cues for gait symmetry, no buckling noted, good RW control today. Denies radicular symptoms with gait.   Stairs Stairs: Yes Stairs assistance: Supervision Stair Management: One rail Right, Step to pattern, Alternating pattern, Forwards Number of Stairs: 5 General stair comments: Educated on stair navigation similar to home set-up with single rail. Cues for step-to pattern for safety. Intermittent alternating pattern but without concern for LOB. Feels confident with task.   Wheelchair Mobility    Modified Rankin (Stroke Patients Only)       Balance Overall balance assessment: Needs assistance Sitting-balance support: No upper extremity supported, Feet supported Sitting balance-Leahy Scale: Good     Standing balance support: No upper extremity supported, During functional activity Standing balance-Leahy Scale: Fair                              Cognition Arousal/Alertness: Awake/alert Behavior During Therapy: WFL for tasks assessed/performed Overall Cognitive Status: Within Functional Limits for tasks assessed  Exercises      General Comments General comments (skin integrity, edema, etc.): Recalls 3/3 handout provided. Discussed safety and awareness with ADLs      Pertinent Vitals/Pain Pain  Assessment Pain Assessment: Faces Faces Pain Scale: Hurts little more Pain Location: back Pain Descriptors / Indicators: Operative site guarding Pain Intervention(s): Monitored during session    Home Living                          Prior Function            PT Goals (current goals can now be found in the care plan section) Acute Rehab PT Goals Patient Stated Goal: Get well PT Goal Formulation: With patient Time For Goal Achievement: 03/20/23 Potential to Achieve Goals: Good Progress towards PT goals: Progressing toward goals    Frequency    Min 4X/week      PT Plan Current plan remains appropriate    Co-evaluation              AM-PAC PT "6 Clicks" Mobility   Outcome Measure  Help needed turning from your back to your side while in a flat bed without using bedrails?: None Help needed moving from lying on your back to sitting on the side of a flat bed without using bedrails?: None Help needed moving to and from a bed to a chair (including a wheelchair)?: A Little Help needed standing up from a chair using your arms (e.g., wheelchair or bedside chair)?: A Little Help needed to walk in hospital room?: A Little Help needed climbing 3-5 steps with a railing? : A Little 6 Click Score: 20    End of Session   Activity Tolerance: Patient tolerated treatment well Patient left: in bed;with call bell/phone within reach   PT Visit Diagnosis: Other abnormalities of gait and mobility (R26.89);Difficulty in walking, not elsewhere classified (R26.2);Pain Pain - part of body:  (back)     Time: 1610-9604 PT Time Calculation (min) (ACUTE ONLY): 13 min  Charges:  $Gait Training: 8-22 mins                     Kathlyn Sacramento, PT, DPT [facility]  Rehabilitation Services Physical Therapist Office: [phone] Website: [email or web address]    Berton Mount 03/07/2023, 9:51 AM

## 2023-03-07 NOTE — Progress Notes (Signed)
Received referral for DME (RW). Met with pt and she agrees to use [facility] for DME referral. Contacted Jasmine with [facility] and she accepted the referral.

## 2023-03-07 NOTE — Progress Notes (Signed)
PT AVS reviewed and pt verbalized understanding of all DC teaching and instructions. Pt has all belongings in her possession and walker has been delivered to room. Pt going home with husband as transportation.

## 2023-03-07 NOTE — Discharge Summary (Signed)
Patient ID: Monique Clark MRN: [id number] DOB/AGE: 12-11-1965 57 y.o.  Admit date: 02/27/2023 Discharge date: 03/07/2023  Admission Diagnoses:  Principal Problem:   History of lumbar laminectomy for spinal cord decompression Active Problems:   Recurrent herniation of lumbar disc   Recurrent fracture of lumbar vertebra [facility])   Spinal headache   Discharge Diagnoses:  Same  Past Medical History:  Diagnosis Date   Abscess    Arthritis    Asthma    November 2023- hospitalized   Atrial fibrillation [facility])    Cardiac arrhythmia due to congenital heart disease    trace MR 08/15/20 echo ([place])- pt denies congenital heart disease   CVID (common variable immunodeficiency) (HCC) 2015   takes Hizentra   Diverticulosis    Elevated cholesterol    GERD (gastroesophageal reflux disease)    Headache    High blood pressure    Non Hodgkin's lymphoma (HCC) 1998   Obesity    Personal history of gestational diabetes    PMR (polymyalgia rheumatica) (HCC)    takes Kevzara   Pneumonia    PONV (postoperative nausea and vomiting)    POTS (postural orthostatic tachycardia syndrome)    Pre-diabetes    Sleep apnea    CPAP with 2L O2   UTI (urinary tract infection)     Surgeries: Procedure(s): EVACUATION HEMATOMA SEROMA WITH RECLOSURE LUMBAR INCISION on 03/04/2023   Consultants:   Discharged Condition: Improved  Hospital Course: Monique Clark is an 57 y.o. female who was admitted 02/27/2023 for operative treatment ofHistory of lumbar laminectomy for spinal cord decompression. Patient has severe unremitting pain that affects sleep, daily activities, and work/hobbies. After pre-op clearance the patient was taken to the operating room on 03/04/2023 and underwent  Procedure(s): EVACUATION HEMATOMA SEROMA WITH RECLOSURE LUMBAR INCISION.    Patient was given perioperative antibiotics:  Anti-infectives (From admission, onward)    Start     Dose/Rate Route Frequency Ordered Stop    03/04/23 1830  ceFAZolin (ANCEF) IVPB 3g/100 mL premix        3 g 200 mL/hr over 30 Minutes Intravenous Every 8 hours 03/04/23 1816 03/05/23 1029   03/04/23 1800  fluconazole (DIFLUCAN) tablet 100 mg        100 mg Oral  Once 03/04/23 1753     03/04/23 1545  ceFAZolin (ANCEF) IVPB 3g/100 mL premix        3 g 200 mL/hr over 30 Minutes Intravenous On call to O.R. 03/04/23 1435 03/04/23 1648   03/04/23 1436  ceFAZolin (ANCEF) 2-4 GM/100ML-% IVPB  Status:  Discontinued       Note to Pharmacy: [facility], [place]: cabinet override      03/04/23 1436 03/04/23 1512   02/27/23 0600  ceFAZolin (ANCEF) IVPB 3g/100 mL premix        3 g 200 mL/hr over 30 Minutes Intravenous On call to O.R. 02/27/23 4098 02/27/23 0751        Patient was given sequential compression devices, early ambulation, and chemoprophylaxis to prevent DVT.  Patient benefited maximally from hospital stay and there were no complications.    Recent vital signs: Patient Vitals for the past 24 hrs:  BP Temp Temp src Pulse Resp SpO2  03/07/23 0732 (!) 97/48 97.8 F (36.6 C) Oral (!) 52 18 95 %  03/07/23 0437 (!) 90/52 97.9 F (36.6 C) Oral (!) 54 20 96 %  03/06/23 2017 (!) 151/52 98.8 F (37.1 C) Oral 70 18 100 %  03/06/23 2009 (!) 151/52 98.8 F (37.1  C) Oral (!) 59 18 100 %  03/06/23 2009 (!) 151/52 98.8 F (37.1 C) Oral (!) 58 18 97 %     Recent laboratory studies:  Recent Labs    03/07/23 0120  WBC 13.1*  HGB 11.0*  HCT 34.6*  PLT 309     Discharge Medications:   Allergies as of 03/07/2023       Reactions   Chlorhexidine Rash   Tetanus-diphtheria Toxoids Td Swelling   angioedema   Nizatidine Hives   Axid   Promethazine Other (See Comments)   High doses causes confusion   Tetanus Toxoids Other (See Comments)   angioedema        Medication List     STOP taking these medications    acetaminophen 500 MG tablet Commonly known as: TYLENOL   HYDROcodone-acetaminophen 5-325 MG tablet Commonly known as:  NORCO/VICODIN   ibuprofen 200 MG tablet Commonly known as: ADVIL   predniSONE 5 MG tablet Commonly known as: DELTASONE       TAKE these medications    albuterol 108 (90 Base) MCG/ACT inhaler Commonly known as: VENTOLIN HFA Inhale 1 puff into the lungs every 6 (six) hours as needed for shortness of breath or wheezing.   diphenhydrAMINE 25 MG tablet Commonly known as: BENADRYL Take 25-50 mg by mouth See admin instructions. Take 25 mg by mouth as needed for allergies or itching and take 50 mg by mouth with Hizentra infusion   docusate sodium 100 MG capsule Commonly known as: COLACE Take 200 mg by mouth at bedtime.   DULoxetine 60 MG capsule Commonly known as: Cymbalta Take 1 capsule (60 mg total) by mouth daily. Annual appt due in April must see provider for future refills What changed: when to take this   famotidine 20 MG tablet Commonly known as: PEPCID Take 20 mg by mouth daily as needed for heartburn or indigestion.   flecainide 100 MG tablet Commonly known as: TAMBOCOR Take 1 tablet (100 mg total) by mouth every 12 (twelve) hours. What changed: when to take this   gabapentin 100 MG capsule Commonly known as: NEURONTIN Take 1 capsule by mouth every night, may increase to 1 capsule by mouth 3 times a day if needed. What changed:  how much to take how to take this when to take this additional instructions   Hizentra 4 GM/20ML Soln Generic drug: Immune Globulin (Human) 36 grams ( ) subcutaneously every 2 weeks via pump What changed:  how much to take how to take this when to take this   Kevzara 200 MG/1. Soaj Generic drug: Sarilumab Inject 200 mg into the skin every 14 (fourteen) days.   methocarbamol 500 MG tablet Commonly known as: ROBAXIN Take 1 tablet (500 mg total) by mouth every 6 (six) hours as needed for muscle spasms.   multivitamin with minerals Tabs tablet Take 1 tablet by mouth at bedtime.   nebivolol 5 MG tablet Commonly known as:  BYSTOLIC Take 1 tablet (5 mg total) by mouth daily. What changed: when to take this   NON FORMULARY Pt uses a cpap nightly w/2 liters of oxygen   oxyCODONE-acetaminophen 5-325 MG tablet Commonly known as: Percocet Take 1-2 tablets by mouth every 6 (six) hours as needed for severe pain.   Tums 500 MG chewable tablet Generic drug: calcium carbonate Chew 1 tablet by mouth 3 (three) times daily.   Vitamin D3 1.25 MG (50000 UT) Caps Take 1 capsule (1.25 mg total) by mouth 2 (two) times a week as  directed What changed:  when to take this additional instructions   Xarelto 20 MG Tabs tablet Generic drug: rivaroxaban Take 1 tablet by mouth every day What changed:  how much to take when to take this   zolpidem 5 MG tablet Commonly known as: Ambien Take 1 tablet (5 mg total) by mouth at bedtime as needed for sleep. What changed: when to take this               Durable Medical Equipment  (From admission, onward)           Start     Ordered   03/07/23 1109  For home use only DME Walker  Once       Question Answer Comment  Patient needs a walker to treat with the following condition Difficulty walking   Patient needs a walker to treat with the following condition Other abnormalities of gait and mobility      03/07/23 1108            Diagnostic Studies: DG Lumbar Spine 2-3 Views  Result Date: 02/27/2023 CLINICAL DATA:  [id number] Surgery, elective [id number] EXAM: LUMBAR SPINE - 2-3 VIEW COMPARISON:  CT 12/30/2022, radiograph 10/16/2022 FINDINGS: Intraoperative lateral radiographs for localization. On image 1, instruments are located at the level of L5-S1. On image 2, instruments are located at the level of L4-L5. IMPRESSION: Intraoperative localization as described above. Electronically Signed   By: Caprice Renshaw M.D.   On: 02/27/2023 12:43    Disposition: Discharge disposition: 01-Home or Self Care       Discharge Instructions     Incentive spirometry RT   Complete  by: As directed    Incentive spirometry RT   Complete by: As directed         Follow-up Information     [name], MD Follow up in 16 day(s).   Specialty: Orthopedic Surgery Contact information: [address] [place] [address] [phone]         Myrlene Broker, MD Follow up.   Specialty: Internal Medicine Contact information: [address] [place] [address] [phone]                  Signed: Kathryne Hitch 03/07/2023, 4:36 PM

## 2023-03-09 ENCOUNTER — Telehealth: Payer: Self-pay

## 2023-03-09 NOTE — Transitions of Care (Post Inpatient/ED Visit) (Signed)
   03/09/2023  Name: Monique Clark MRN: [id number] DOB: [birth year]  Today's TOC FU Call Status: Today's TOC FU Call Status:: Unsuccessul Call (1st Attempt) Unsuccessful Call (1st Attempt) Date: 03/09/23  Attempted to reach the patient regarding the most recent Inpatient/ED visit.  Follow Up Plan: Additional outreach attempts will be made to reach the patient to complete the Transitions of Care (Post Inpatient/ED visit) call.   Signature   Woodfin Ganja LPN [facility] Nurse Health Advisor Direct Dial [phone]

## 2023-03-12 NOTE — Transitions of Care (Post Inpatient/ED Visit) (Signed)
03/12/2023  Name: Monique Clark MRN: [id number] DOB: 14-Mar-1966  Today's TOC FU Call Status: Today's TOC FU Call Status:: Successful TOC FU Call Competed Unsuccessful Call (1st Attempt) Date: 03/09/23 [facility] FU Call Complete Date: 03/12/23  Transition Care Management Follow-up Telephone Call Date of Discharge: 03/07/23 Discharge Facility: [facility]) Type of Discharge: Inpatient Admission Primary Inpatient Discharge Diagnosis:: lumbar laminectomy Reason for ED Visit: Other: How have you been since you were released from the hospital?: Better Any questions or concerns?: No  Items Reviewed: Did you receive and understand the discharge instructions provided?: Yes Medications obtained,verified, and reconciled?: Yes (Medications Reviewed) Any new allergies since your discharge?: No Dietary orders reviewed?: No Do you have support at home?: Yes People in Home: spouse, child(ren), adult  Medications Reviewed Today: Medications Reviewed Today     Reviewed by Annabell Sabal, CMA (Certified Medical Assistant) on 03/12/23 at 1420  Med List Status: <None>   Medication Order Taking? Sig Documenting Provider Last Dose Status Informant  albuterol (VENTOLIN HFA) 108 (90 Base) MCG/ACT inhaler [id number] Yes Inhale 1 puff into the lungs every 6 (six) hours as needed for shortness of breath or wheezing. [provider] Taking Active Self  calcium carbonate (TUMS) 500 MG chewable tablet [id number] Yes Chew 1 tablet by mouth 3 (three) times daily. [provider] Taking Active Self  Cholecalciferol (VITAMIN D3) 1.25 MG (50000 UT) CAPS [id number] Yes Take 1 capsule (1.25 mg total) by mouth 2 (two) times a week as directed  Patient taking differently: Take 50,000 Units by mouth every 14 (fourteen) days. Mondays   Myrlene Broker, MD Taking Active Self  diphenhydrAMINE (BENADRYL) 25 MG tablet [id number] Yes Take 25-50 mg by mouth See admin instructions. Take 25 mg by mouth as  needed for allergies or itching and take 50 mg by mouth with Hizentra infusion [provider] Taking Active Self  docusate sodium (COLACE) 100 MG capsule [id number] Yes Take 200 mg by mouth at bedtime. [provider] Taking Active Self  DULoxetine (CYMBALTA) 60 MG capsule [id number] Yes Take 1 capsule (60 mg total) by mouth daily. Annual appt due in April must see provider for future refills  Patient taking differently: Take 60 mg by mouth at bedtime. Annual appt due in April must see provider for future refills   Myrlene Broker, MD Taking Active Self  famotidine (PEPCID) 20 MG tablet [id number] Yes Take 20 mg by mouth daily as needed for heartburn or indigestion. [provider] Taking Active Self  flecainide (TAMBOCOR) 100 MG tablet [id number] Yes Take 1 tablet (100 mg total) by mouth every 12 (twelve) hours.  Patient taking differently: Take 100 mg by mouth at bedtime.    Taking Active Self  gabapentin (NEURONTIN) 100 MG capsule [id number] Yes Take 1 capsule by mouth every night, may increase to 1 capsule by mouth 3 times a day if needed.  Patient taking differently: Take 300 mg by mouth at bedtime.   [name], MD Taking Active Self  Immune Globulin, Human, (HIZENTRA) 4 GM/20ML SOLN [id number] Yes 36 grams ( ) subcutaneously every 2 weeks via pump  Patient taking differently: Inject 36 g into the skin every 14 (fourteen) days.    Taking Active Self           Med Note Farris Has Feb 16, 2023 10:12 AM) Last dose:02/16/23  methocarbamol (ROBAXIN) 500 MG tablet [id number] Yes Take 1 tablet (500 mg total) by mouth every 6 (six) hours as  needed for muscle spasms. [name], MD Taking Active   Multiple Vitamin (MULTIVITAMIN WITH MINERALS) TABS tablet [id number] Yes Take 1 tablet by mouth at bedtime. [provider] Taking Active Self  nebivolol (BYSTOLIC) 5 MG tablet [id number] Yes Take 1 tablet (5 mg total) by mouth daily.  Patient  taking differently: Take 5 mg by mouth at bedtime.    Taking Active Self  NON FORMULARY [id number] Yes Pt uses a cpap nightly w/2 liters of oxygen [provider] Taking Active Self  oxyCODONE-acetaminophen (PERCOCET) 5-325 MG tablet [id number] Yes Take 1-2 tablets by mouth every 6 (six) hours as needed for severe pain. [name], MD Taking Active   rivaroxaban Carlena Hurl) 20 MG TABS tablet [id number] Yes Take 1 tablet by mouth every day  Patient taking differently: Take 20 mg by mouth every evening.    Taking Active Self  Sarilumab (KEVZARA) 200 MG/1. SOAJ [id number] Yes Inject 200 mg into the skin every 14 (fourteen) days. Fuller Plan, MD Taking Active   zolpidem (AMBIEN) 5 MG tablet [id number] Yes Take 1 tablet (5 mg total) by mouth at bedtime as needed for sleep.  Patient taking differently: Take 5 mg by mouth at bedtime.   Myrlene Broker, MD Taking Active Self  Med List Note Monique Clark, [facility] 02/20/23 7425): DO NOT MAIL HYDROCODONE            Home Care and Equipment/Supplies: Were Home Health Services Ordered?: No Any new equipment or medical supplies ordered?: Yes Name of Medical supply agency?: [place] Were you able to get the equipment/medical supplies?: Yes Do you have any questions related to the use of the equipment/supplies?: No  Functional Questionnaire: Do you need assistance with bathing/showering or dressing?: Yes Do you need assistance with meal preparation?: Yes Do you need assistance with eating?: No Do you have difficulty maintaining continence: No Do you need assistance with getting out of bed/getting out of a chair/moving?: No Do you have difficulty managing or taking your medications?: No  Follow up appointments reviewed: PCP Follow-up appointment confirmed?: Yes Date of PCP follow-up appointment?: 03/16/23 Follow-up Provider: crawford Specialist [facility] Follow-up appointment confirmed?: Yes Date of Specialist follow-up  appointment?: 03/24/23 Follow-Up Specialty Provider:: yates Do you need transportation to your follow-up appointment?: No Do you understand care options if your condition(s) worsen?: Yes-patient verbalized understanding    SIGNATURE Fredirick Maudlin

## 2023-03-13 ENCOUNTER — Other Ambulatory Visit: Payer: Self-pay | Admitting: Internal Medicine

## 2023-03-13 ENCOUNTER — Other Ambulatory Visit: Payer: Self-pay

## 2023-03-13 ENCOUNTER — Other Ambulatory Visit (HOSPITAL_COMMUNITY): Payer: Self-pay

## 2023-03-13 ENCOUNTER — Other Ambulatory Visit: Payer: Self-pay | Admitting: Orthopaedic Surgery

## 2023-03-13 DIAGNOSIS — G4733 Obstructive sleep apnea (adult) (pediatric): Secondary | ICD-10-CM | POA: Diagnosis not present

## 2023-03-13 MED ORDER — GABAPENTIN 100 MG PO CAPS
ORAL_CAPSULE | ORAL | 0 refills | Status: DC
  Filled 2023-03-13 – 2023-03-16 (×2): qty 90, 30d supply, fill #0

## 2023-03-13 MED ORDER — OXYCODONE-ACETAMINOPHEN 5-325 MG PO TABS
1.0000 | ORAL_TABLET | Freq: Four times a day (QID) | ORAL | 0 refills | Status: DC | PRN
  Filled 2023-03-13 – 2023-03-16 (×2): qty 40, 5d supply, fill #0

## 2023-03-16 ENCOUNTER — Encounter: Payer: Self-pay | Admitting: Internal Medicine

## 2023-03-16 ENCOUNTER — Ambulatory Visit (INDEPENDENT_AMBULATORY_CARE_PROVIDER_SITE_OTHER): Payer: Commercial Managed Care - PPO | Admitting: Internal Medicine

## 2023-03-16 ENCOUNTER — Other Ambulatory Visit: Payer: Self-pay

## 2023-03-16 ENCOUNTER — Other Ambulatory Visit (HOSPITAL_COMMUNITY): Payer: Self-pay

## 2023-03-16 VITALS — BP 112/68 | HR 62 | Temp 98.5°F | Ht 65.0 in | Wt 288.0 lb

## 2023-03-16 DIAGNOSIS — D839 Common variable immunodeficiency, unspecified: Secondary | ICD-10-CM

## 2023-03-16 DIAGNOSIS — Z9889 Other specified postprocedural states: Secondary | ICD-10-CM

## 2023-03-16 DIAGNOSIS — G9332 Myalgic encephalomyelitis/chronic fatigue syndrome: Secondary | ICD-10-CM

## 2023-03-16 DIAGNOSIS — M353 Polymyalgia rheumatica: Secondary | ICD-10-CM

## 2023-03-16 DIAGNOSIS — I480 Paroxysmal atrial fibrillation: Secondary | ICD-10-CM

## 2023-03-16 DIAGNOSIS — R7303 Prediabetes: Secondary | ICD-10-CM | POA: Diagnosis not present

## 2023-03-16 DIAGNOSIS — Z0001 Encounter for general adult medical examination with abnormal findings: Secondary | ICD-10-CM

## 2023-03-16 DIAGNOSIS — I10 Essential (primary) hypertension: Secondary | ICD-10-CM

## 2023-03-16 DIAGNOSIS — M0600 Rheumatoid arthritis without rheumatoid factor, unspecified site: Secondary | ICD-10-CM | POA: Diagnosis not present

## 2023-03-16 DIAGNOSIS — Z0000 Encounter for general adult medical examination without abnormal findings: Secondary | ICD-10-CM

## 2023-03-16 DIAGNOSIS — D8989 Other specified disorders involving the immune mechanism, not elsewhere classified: Secondary | ICD-10-CM

## 2023-03-16 MED ORDER — DULOXETINE HCL 60 MG PO CPEP
60.0000 mg | ORAL_CAPSULE | Freq: Every day | ORAL | 3 refills | Status: DC
  Filled 2023-03-16: qty 90, 90d supply, fill #0
  Filled 2023-04-16 – 2023-06-01 (×3): qty 90, 90d supply, fill #1
  Filled 2023-08-03 – 2023-09-08 (×2): qty 90, 90d supply, fill #2
  Filled 2023-12-07: qty 90, 90d supply, fill #3

## 2023-03-16 NOTE — Assessment & Plan Note (Signed)
Taking kevzara and overall stable. Recent labs stable for monitoring. Encouraged shingrix and is able to take this as it is not live.

## 2023-03-16 NOTE — Assessment & Plan Note (Signed)
Taking flecainide and bystolic and eliquis. Sounds regular on exam today. Continue same regimen.

## 2023-03-16 NOTE — Assessment & Plan Note (Signed)
Doing well post-op and following up with surgeon on this.

## 2023-03-16 NOTE — Progress Notes (Unsigned)
   Subjective:   Patient ID: Monique Clark, female    DOB: 05-24-1966, 57 y.o.   MRN: [id number]  HPI The patient is a 57 YO female coming in for hospital follow up (in for back surgery) and wants physical  PMH, [facility], social history reviewed and updated  Review of Systems  Constitutional:  Positive for activity change.  HENT: Negative.    Eyes: Negative.   Respiratory:  Negative for cough, chest tightness and shortness of breath.   Cardiovascular:  Negative for chest pain, palpitations and leg swelling.  Gastrointestinal:  Negative for abdominal distention, abdominal pain, constipation, diarrhea, nausea and vomiting.  Musculoskeletal:  Positive for back pain and gait problem.  Skin: Negative.   Psychiatric/Behavioral: Negative.      Objective:  Physical Exam Constitutional:      Appearance: She is well-developed. She is obese.  HENT:     Head: Normocephalic and atraumatic.  Cardiovascular:     Rate and Rhythm: Normal rate and regular rhythm.  Pulmonary:     Effort: Pulmonary effort is normal. No respiratory distress.     Breath sounds: Normal breath sounds. No wheezing or rales.  Abdominal:     General: Bowel sounds are normal. There is no distension.     Palpations: Abdomen is soft.     Tenderness: There is no abdominal tenderness. There is no rebound.  Musculoskeletal:     Cervical back: Normal range of motion.  Skin:    General: Skin is warm and dry.  Neurological:     Mental Status: She is alert and oriented to person, place, and time.     Coordination: Coordination abnormal.     Comments: Walker for ambulation     Vitals:   03/16/23 1308  BP: 112/68  Pulse: 62  Temp: 98.5 F (36.9 C)  TempSrc: Oral  SpO2: 98%  Weight: 288 lb (130.6 kg)  Height: 5\' 5"  (1.651 m)    Assessment & Plan:

## 2023-03-16 NOTE — Patient Instructions (Addendum)
We have sent in the refill and let us know if you want to try naltrexone once off the opioid pain medications.

## 2023-03-16 NOTE — Assessment & Plan Note (Addendum)
No infection today checked ears. Discussed shingrix recommendation.

## 2023-03-16 NOTE — Assessment & Plan Note (Signed)
BP at goal on bystolic 5 mg daily. HR at goal.

## 2023-03-16 NOTE — Assessment & Plan Note (Signed)
Wegovy not covered. We discussed naltrexone to help once she is off opioids post-operatively. She understands about diet and exercise and is down 6 pounds since Nov 2023 and is working on slow and steady weight loss.

## 2023-03-17 DIAGNOSIS — Z0001 Encounter for general adult medical examination with abnormal findings: Secondary | ICD-10-CM | POA: Insufficient documentation

## 2023-03-17 NOTE — Assessment & Plan Note (Signed)
Recent HgA1c at goal will monitor every 6-12 months.

## 2023-03-17 NOTE — Assessment & Plan Note (Signed)
Overall stable and is taking cymbalta 60 mg daily. Continue.

## 2023-03-17 NOTE — Assessment & Plan Note (Signed)
Not on steroids or methotrexate currently and seeing rheumatology for management. No current flare.

## 2023-03-17 NOTE — Assessment & Plan Note (Signed)
Flu shot yearly. Pneumonia up to date. Shingrix due counseled. Tetanus up to date. Colonoscopy up to date. Mammogram up to date, pap smear up to date. Counseled about sun safety and mole surveillance. Counseled about the dangers of distracted driving. Given 10 year screening recommendations.

## 2023-03-20 ENCOUNTER — Other Ambulatory Visit: Payer: Self-pay

## 2023-03-20 ENCOUNTER — Other Ambulatory Visit (HOSPITAL_COMMUNITY): Payer: Self-pay

## 2023-03-20 NOTE — Progress Notes (Unsigned)
Office Visit Note  Patient: Monique Clark             Date of Birth: [birth year]           MRN: [id number]             PCP: Myrlene Broker, MD Referring: Myrlene Broker, * Visit Date: 03/24/2023   Subjective:  No chief complaint on file.   History of Present Illness: Monique Clark is a 57 y.o. female here for follow up for PMR on KEVZARA 200mg  subcut every 14 days and prednisone 10 mg daily.    Previous HPI 11/17/2022 [name] is a 57 y.o. female here for follow up for PMR on prednisone 10 mg daily.  May recheck labs previously in January getting back onto this medication dose still showing very highly elevated serum inflammatory markers.  Symptoms are not too bad while on medication but she has not tolerated tapering.  In the number of infectious issues related to URI but currently this has cleared up as well.     Previous HPI 09/18/22 Monique Clark is a 57 y.o. female here for follow up for PMR on prednisone 10 mg daily. Since the past visit she had a hospital admission for hypoxia suspected as viral URI trigger of asthma exacerbation. She stopped methotrexate at that time and has been holding off resuming the medication concern about contributing to infection.  Currently is partly through a course of Augmentin for the persistent upper respiratory infection.  She had lumbar spine injection for ongoing back pain without a great relief of symptoms this time.   Previous HPI 05/20/22 Monique Clark is a 57 y.o. female here for follow up for PMR on methotrexate 15 mg p.o. weekly.  Since her last visit she underwent gastric sleeve resection surgery for weight reduction.  This has still been in the recovery process.  She discontinued the prednisone perioperatively and has been off this.  She notices multiple symptoms are worse with joint pain and swelling especially in shoulders and elbows.  Not as much lower extremity swelling.   Previous  HPI 01/06/2022 Monique Clark is a 57 y.o. female here for follow up for PMR on prednisone 10 mg daily and started methotrexate 10 mg PO weekly last month due to persistent inflammatory lab elevations.  She continues having fatigue is persistent.  Shortness of breath with walking and exertion is slightly improved.  She has not noticed any side effects or intolerance to methotrexate.  She is continuing to try to work on her exercise as a part of preparation for bariatric surgery also awaiting upcoming cardiac evaluation.     Previous HPI 12/02/21 Monique Clark is a 57 y.o. female here for muscle pain and weakness in shoulders and legs concern for history of PMR previously treated with long term prednisone.  She has a history of CVID on IVIG, non hodgkins lymphoma, also has history of bilateral hip replacement. Recently had microdiscectomy about 6 weeks prior. Started prednisone 40 mg daily last month decreased to 20 mg daily and now is down to 10 mg dose. She felt her symptoms improve about 80-90% when starting prednisone again and tapering has been okay, only slight increase with this dose reduction. She does not see any particular swelling, discoloration, or rashes. She has noticed some hand tremor more when using her hands that is not typical for her.   No Rheumatology ROS completed.   PMFS History:  Patient Active Problem List   Diagnosis Date  Noted   Encounter for general adult medical examination with abnormal findings 03/17/2023   Recurrent fracture of lumbar vertebra (HCC) 03/01/2023   History of lumbar laminectomy for spinal cord decompression 02/27/2023   Seronegative rheumatoid arthritis (HCC) 11/17/2022   Nocturnal hypoxemia 08/29/2022   Pulmonary nodule 08/16/2022   Recurrent herniation of lumbar disc 07/25/2022   OSA on CPAP 05/19/2022   Impingement syndrome of left shoulder 05/13/2022   Leukocytosis 01/20/2022   Morbid obesity (HCC) 01/01/2022   Chronic fatigue and immune  dysfunction syndrome (HCC) 01/01/2022   High risk medication use 12/02/2021   PMR (polymyalgia rheumatica) (HCC) 11/05/2021   Insomnia 05/28/2021   Malignant (primary) neoplasm, unspecified (HCC) 03/08/2021   Personal history of urinary (tract) infections 03/08/2021   Personal history of pneumonia (recurrent) 03/08/2021   Elevated serum hCG 07/02/2020   POTS (postural orthostatic tachycardia syndrome) 03/08/2020   Mixed hyperlipidemia 03/21/2019   Paroxysmal atrial fibrillation (HCC)    Localized swelling of left lower extremity    Essential hypertension    Mitral valve regurgitation 06/22/2017   CVID (common variable immunodeficiency) (HCC) 05/07/2015   Prediabetes 01/20/2013   History of non-Hodgkin's lymphoma 12/21/2012    Past Medical History:  Diagnosis Date   Abscess    Arthritis    Asthma    November 2023- hospitalized   Atrial fibrillation (HCC)    Cardiac arrhythmia due to congenital heart disease    trace MR 08/15/20 echo ([place])- pt denies congenital heart disease   CVID (common variable immunodeficiency) (HCC) 2015   takes Hizentra   Diverticulosis    Elevated cholesterol    GERD (gastroesophageal reflux disease)    Headache    High blood pressure    Non Hodgkin's lymphoma (HCC) 1998   Obesity    Personal history of gestational diabetes    PMR (polymyalgia rheumatica) (HCC)    takes Kevzara   Pneumonia    PONV (postoperative nausea and vomiting)    POTS (postural orthostatic tachycardia syndrome)    Pre-diabetes    Sleep apnea    CPAP with 2L O2   UTI (urinary tract infection)     Family History  Problem Relation Age of Onset   Atrial fibrillation Mother    Colon cancer Father    Cancer Father        Colon   Colon cancer Sister    Basal cell carcinoma Brother    Atrial fibrillation Maternal Uncle    Atrial fibrillation Maternal Grandmother    Atrial fibrillation Maternal Grandfather    Heart attack Maternal Grandfather    Ovarian  cancer Neg Hx    Stomach cancer Neg Hx    Rectal cancer Neg Hx    Past Surgical History:  Procedure Laterality Date   ABDOMINAL HYSTERECTOMY     APPENDECTOMY     CESAREAN SECTION     CHOLECYSTECTOMY     COLONOSCOPY     HEMATOMA EVACUATION N/A 03/04/2023   Procedure: EVACUATION HEMATOMA SEROMA WITH RECLOSURE LUMBAR INCISION;  Surgeon: [name], MD;  Location: [facility] OR;  Service: Orthopedics;  Laterality: N/A;   JOINT REPLACEMENT Left    hip   JOINT REPLACEMENT Right    Right Hip 2022   LAPAROSCOPIC GASTRIC SLEEVE RESECTION  04/29/2022   left shoulder repair     rotator cuff repair   LUMBAR LAMINECTOMY N/A 09/18/2021   Procedure: RIGHT LUMBAR FOUR-FIVE MICRODISCECTOMY;  Surgeon: [name], MD;  Location: [facility] OR;  Service: Orthopedics;  Laterality: N/A;  LUMBAR LAMINECTOMY/DECOMPRESSION MICRODISCECTOMY N/A 02/27/2023   Procedure: L4-5 LUMBAR DECOMPRESSION, MICRODISCECTOMY;  Surgeon: [name], MD;  Location: [facility] OR;  Service: Orthopedics;  Laterality: N/A;   LUNG REMOVAL, PARTIAL Right 2009   VATS, wedge resection partial lobectomy, done at [facility]   myringostomy Bilateral    Myringotomy   SPLENECTOMY, TOTAL  1998   TONSILLECTOMY AND ADENOIDECTOMY     UPPER GI ENDOSCOPY N/A 04/29/2022   Procedure: UPPER GI ENDOSCOPY;  Surgeon: Sheliah Hatch, De Blanch, MD;  Location: [facility];  Service: General;  Laterality: N/A;   Social History   Social History Narrative   Not on file   Immunization History  Administered Date(s) Administered   Influenza-Unspecified 07/27/2020, 07/08/2022   PFIZER(Purple Top)SARS-COV-2 Vaccination 09/24/2019, 10/12/2019     Objective: Vital Signs: There were no vitals taken for this visit.   Physical Exam   Musculoskeletal Exam: ***  CDAI Exam: CDAI Score: -- Patient Global: --; Provider Global: -- Swollen: --; Tender: -- Joint Exam 03/24/2023   No joint exam has been documented for this visit   There is currently no information  documented on the homunculus. Go to the Rheumatology activity and complete the homunculus joint exam.  Investigation: No additional findings.  Imaging: DG Lumbar Spine 2-3 Views  Result Date: 02/27/2023 CLINICAL DATA:  [id number] Surgery, elective [id number] EXAM: LUMBAR SPINE - 2-3 VIEW COMPARISON:  CT 12/30/2022, radiograph 10/16/2022 FINDINGS: Intraoperative lateral radiographs for localization. On image 1, instruments are located at the level of L5-S1. On image 2, instruments are located at the level of L4-L5. IMPRESSION: Intraoperative localization as described above. Electronically Signed   By: Caprice Renshaw M.D.   On: 02/27/2023 12:43    Recent Labs: Lab Results  Component Value Date   WBC 13.1 (H) 03/07/2023   HGB 11.0 (L) 03/07/2023   PLT 309 03/07/2023   NA 135 03/01/2023   K 3.7 03/01/2023   CL 102 03/01/2023   CO2 26 03/01/2023   GLUCOSE 121 (H) 03/01/2023   BUN 11 03/01/2023   CREATININE 0.60 03/01/2023   BILITOT 0.2 11/17/2022   ALKPHOS 70 08/16/2022   AST 16 11/17/2022   ALT 13 11/17/2022   PROT 6.4 11/17/2022   ALBUMIN 3.6 08/16/2022   CALCIUM 8.4 (L) 03/01/2023   GFRAA >60 03/18/2020    Speciality Comments: No specialty comments available.  Procedures:  No procedures performed Allergies: Chlorhexidine, Tetanus-diphtheria toxoids td, Nizatidine, Promethazine, and Tetanus toxoids   Assessment / Plan:     Visit Diagnoses: No diagnosis found.  ***  Orders: No orders of the defined types were placed in this encounter.  No orders of the defined types were placed in this encounter.    Follow-Up Instructions: No follow-ups on file.   Ellen Henri, CMA  Note - This record has been created using Animal nutritionist.  Chart creation errors have been sought, but may not always  have been located. Such creation errors do not reflect on  the standard of medical care.

## 2023-03-22 ENCOUNTER — Other Ambulatory Visit: Payer: Self-pay | Admitting: Physician Assistant

## 2023-03-22 ENCOUNTER — Other Ambulatory Visit: Payer: Self-pay | Admitting: Orthopaedic Surgery

## 2023-03-23 ENCOUNTER — Other Ambulatory Visit (HOSPITAL_COMMUNITY): Payer: Self-pay

## 2023-03-23 ENCOUNTER — Other Ambulatory Visit: Payer: Self-pay

## 2023-03-23 DIAGNOSIS — D801 Nonfamilial hypogammaglobulinemia: Secondary | ICD-10-CM | POA: Diagnosis not present

## 2023-03-23 MED ORDER — METHOCARBAMOL 500 MG PO TABS
500.0000 mg | ORAL_TABLET | Freq: Four times a day (QID) | ORAL | 0 refills | Status: DC | PRN
  Filled 2023-03-23: qty 30, 8d supply, fill #0

## 2023-03-24 ENCOUNTER — Encounter: Payer: Self-pay | Admitting: Internal Medicine

## 2023-03-24 ENCOUNTER — Other Ambulatory Visit: Payer: Self-pay

## 2023-03-24 ENCOUNTER — Ambulatory Visit: Payer: Commercial Managed Care - PPO | Attending: Internal Medicine | Admitting: Internal Medicine

## 2023-03-24 ENCOUNTER — Other Ambulatory Visit (HOSPITAL_COMMUNITY): Payer: Self-pay

## 2023-03-24 ENCOUNTER — Encounter: Payer: Self-pay | Admitting: Orthopaedic Surgery

## 2023-03-24 ENCOUNTER — Telehealth: Payer: Self-pay | Admitting: Orthopaedic Surgery

## 2023-03-24 ENCOUNTER — Ambulatory Visit (INDEPENDENT_AMBULATORY_CARE_PROVIDER_SITE_OTHER): Payer: Commercial Managed Care - PPO | Admitting: Orthopaedic Surgery

## 2023-03-24 ENCOUNTER — Other Ambulatory Visit: Payer: Self-pay | Admitting: Orthopaedic Surgery

## 2023-03-24 VITALS — BP 116/72 | HR 52 | Resp 16 | Ht 65.0 in | Wt 290.0 lb

## 2023-03-24 VITALS — Ht 65.0 in | Wt 290.0 lb

## 2023-03-24 DIAGNOSIS — M0600 Rheumatoid arthritis without rheumatoid factor, unspecified site: Secondary | ICD-10-CM

## 2023-03-24 DIAGNOSIS — Z79899 Other long term (current) drug therapy: Secondary | ICD-10-CM | POA: Diagnosis not present

## 2023-03-24 DIAGNOSIS — Z9889 Other specified postprocedural states: Secondary | ICD-10-CM

## 2023-03-24 DIAGNOSIS — M353 Polymyalgia rheumatica: Secondary | ICD-10-CM | POA: Diagnosis not present

## 2023-03-24 DIAGNOSIS — Z8572 Personal history of non-Hodgkin lymphomas: Secondary | ICD-10-CM

## 2023-03-24 LAB — CBC WITH DIFFERENTIAL/PLATELET
Absolute Monocytes: 640 cells/uL (ref 200–950)
Basophils Absolute: 51 cells/uL (ref 0–200)
Eosinophils Absolute: 141 cells/uL (ref 15–500)
MCV: 88.7 fL (ref 80.0–100.0)
MPV: 12.6 fL — ABNORMAL HIGH (ref 7.5–12.5)
Total Lymphocyte: 48 %

## 2023-03-24 MED ORDER — OXYCODONE-ACETAMINOPHEN 5-325 MG PO TABS: 1.0000 | ORAL_TABLET | Freq: Four times a day (QID) | ORAL | 0 refills | Status: DC | PRN

## 2023-03-24 MED ORDER — OXYCODONE-ACETAMINOPHEN 5-325 MG PO TABS
1.0000 | ORAL_TABLET | Freq: Four times a day (QID) | ORAL | 0 refills | Status: DC | PRN
  Filled 2023-03-24: qty 40, 5d supply, fill #0

## 2023-03-24 NOTE — Telephone Encounter (Signed)
Pt called in stating [facility] pharmacy systems are down and can we sent her Rx to the [facility] in [place] on file also pt would like to know when she is going to be able to drive

## 2023-03-24 NOTE — Progress Notes (Signed)
Post-Op Visit Note   Patient: Monique Clark           Date of Birth: 05/27/1966           MRN: [id number] Visit Date: 03/24/2023 PCP: Myrlene Broker, MD   Assessment & Plan: Follow-up lumbar decompression L4-5 with Dr. Christell Constant assisting.  She had postop hematoma seroma and had to have evacuation washout and reclosure.  Incision looks good sutures are harvested.  She is gradually starting to set up a little bit more no problems with headaches.  She is out until July 25 approximately.  If she feels like she is ready to go back few hours per day prior to that she will call and let us know.  Percocet renewed she is cutting back from 2 tablets to 1 and is weaning it on her own as she did with the previous surgery.  Chief Complaint:  Chief Complaint  Patient presents with   Lower Back - Routine Post Op    02/27/2023 L4-5 lumbar decompression/microdiscectomy 03/04/2023 evacuation hematoma seroma with reclosure lumbar incision   Visit Diagnoses:  1. History of lumbar laminectomy for spinal cord decompression     Plan: Recheck 1 month.  Percocet renewed.  Follow-Up Instructions: Return in about 1 month (around 04/23/2023).   Orders:  No orders of the defined types were placed in this encounter.  Meds ordered this encounter  Medications   oxyCODONE-acetaminophen (PERCOCET) 5-325 MG tablet    Sig: Take 1-2 tablets by mouth every 6 (six) hours as needed for severe pain.    Dispense:  40 tablet    Refill:  0    Post op pain    Imaging: No results found.  PMFS History: Patient Active Problem List   Diagnosis Date Noted   Encounter for general adult medical examination with abnormal findings 03/17/2023   Recurrent fracture of lumbar vertebra (HCC) 03/01/2023   History of lumbar laminectomy for spinal cord decompression 02/27/2023   Seronegative rheumatoid arthritis (HCC) 11/17/2022   Nocturnal hypoxemia 08/29/2022   Pulmonary nodule 08/16/2022   Recurrent herniation of lumbar  disc 07/25/2022   OSA on CPAP 05/19/2022   Impingement syndrome of left shoulder 05/13/2022   Leukocytosis 01/20/2022   Morbid obesity (HCC) 01/01/2022   Chronic fatigue and immune dysfunction syndrome (HCC) 01/01/2022   High risk medication use 12/02/2021   PMR (polymyalgia rheumatica) (HCC) 11/05/2021   Status post lumbar spine operative procedure for decompression of spinal cord 09/27/2021   Insomnia 05/28/2021   Malignant (primary) neoplasm, unspecified (HCC) 03/08/2021   Personal history of urinary (tract) infections 03/08/2021   Personal history of pneumonia (recurrent) 03/08/2021   Elevated serum hCG 07/02/2020   POTS (postural orthostatic tachycardia syndrome) 03/08/2020   Mixed hyperlipidemia 03/21/2019   Paroxysmal atrial fibrillation (HCC)    Localized swelling of left lower extremity    Essential hypertension    Mitral valve regurgitation 06/22/2017   CVID (common variable immunodeficiency) (HCC) 05/07/2015   Prediabetes 01/20/2013   History of non-Hodgkin's lymphoma 12/21/2012   Past Medical History:  Diagnosis Date   Abscess    Arthritis    Asthma    November 2023- hospitalized   Atrial fibrillation [facility])    Cardiac arrhythmia due to congenital heart disease    trace MR 08/15/20 echo ([place])- pt denies congenital heart disease   CVID (common variable immunodeficiency) (HCC) 2015   takes Hizentra   Diverticulosis    Elevated cholesterol    GERD (gastroesophageal reflux disease)  Headache    High blood pressure    Non Hodgkin's lymphoma (HCC) 1998   Obesity    Personal history of gestational diabetes    PMR (polymyalgia rheumatica) (HCC)    takes Kevzara   Pneumonia    PONV (postoperative nausea and vomiting)    POTS (postural orthostatic tachycardia syndrome)    Pre-diabetes    Sleep apnea    CPAP with 2L O2   UTI (urinary tract infection)     Family History  Problem Relation Age of Onset   Atrial fibrillation Mother    Colon cancer  Father    Cancer Father        Colon   Colon cancer Sister    Basal cell carcinoma Brother    Atrial fibrillation Maternal Uncle    Atrial fibrillation Maternal Grandmother    Atrial fibrillation Maternal Grandfather    Heart attack Maternal Grandfather    Ovarian cancer Neg Hx    Stomach cancer Neg Hx    Rectal cancer Neg Hx     Past Surgical History:  Procedure Laterality Date   ABDOMINAL HYSTERECTOMY     APPENDECTOMY     CESAREAN SECTION     CHOLECYSTECTOMY     COLONOSCOPY     HEMATOMA EVACUATION N/A 03/04/2023   Procedure: EVACUATION HEMATOMA SEROMA WITH RECLOSURE LUMBAR INCISION;  Surgeon: [name], MD;  Location: [facility] OR;  Service: Orthopedics;  Laterality: N/A;   JOINT REPLACEMENT Left    hip   JOINT REPLACEMENT Right    Right Hip 2022   LAPAROSCOPIC GASTRIC SLEEVE RESECTION  04/29/2022   left shoulder repair     rotator cuff repair   LUMBAR LAMINECTOMY N/A 09/18/2021   Procedure: RIGHT LUMBAR FOUR-FIVE MICRODISCECTOMY;  Surgeon: [name], MD;  Location: [facility] OR;  Service: Orthopedics;  Laterality: N/A;   LUMBAR LAMINECTOMY/DECOMPRESSION MICRODISCECTOMY N/A 02/27/2023   Procedure: L4-5 LUMBAR DECOMPRESSION, MICRODISCECTOMY;  Surgeon: [name], MD;  Location: [facility];  Service: Orthopedics;  Laterality: N/A;   LUNG REMOVAL, PARTIAL Right 2009   VATS, wedge resection partial lobectomy, done at [facility]   myringostomy Bilateral    Myringotomy   SPLENECTOMY, TOTAL  1998   TONSILLECTOMY AND ADENOIDECTOMY     UPPER GI ENDOSCOPY N/A 04/29/2022   Procedure: UPPER GI ENDOSCOPY;  Surgeon: Sheliah Hatch, De Blanch, MD;  Location: [facility];  Service: General;  Laterality: N/A;   Social History   Occupational History   Occupation: rn  Tobacco Use   Smoking status: Former    Packs/day: 0.50    Years: 20.00    Additional pack years: 0.00    Total pack years: 10.00    Types: Cigarettes    Quit date: 09/29/2002    Years since quitting: 20.4    Passive exposure:  Current   Smokeless tobacco: Never  Vaping Use   Vaping Use: Never used  Substance and Sexual Activity   Alcohol use: Yes    Comment: once a month- rarely   Drug use: No   Sexual activity: Yes    Birth control/protection: Surgical, Post-menopausal    Comment: 1st intercourse 57 yo-5 partners

## 2023-03-25 ENCOUNTER — Other Ambulatory Visit: Payer: Self-pay

## 2023-03-25 ENCOUNTER — Other Ambulatory Visit (HOSPITAL_COMMUNITY): Payer: Self-pay

## 2023-03-25 LAB — CBC WITH DIFFERENTIAL/PLATELET
Basophils Relative: 0.8 %
Eosinophils Relative: 2.2 %
HCT: 38.6 % (ref 35.0–45.0)
Hemoglobin: 12.2 g/dL (ref 11.7–15.5)
Lymphs Abs: 3072 cells/uL (ref 850–3900)
MCH: 28 pg (ref 27.0–33.0)
MCHC: 31.6 g/dL — ABNORMAL LOW (ref 32.0–36.0)
Monocytes Relative: 10 %
Neutro Abs: 2496 cells/uL (ref 1500–7800)
Neutrophils Relative %: 39 %
Platelets: 414 10*3/uL — ABNORMAL HIGH (ref 140–400)
RBC: 4.35 10*6/uL (ref 3.80–5.10)
RDW: 14.1 % (ref 11.0–15.0)
WBC: 6.4 10*3/uL (ref 3.8–10.8)

## 2023-03-25 LAB — COMPLETE METABOLIC PANEL WITH GFR
AG Ratio: 1.6 (calc) (ref 1.0–2.5)
ALT: 13 U/L (ref 6–29)
AST: 20 U/L (ref 10–35)
Albumin: 3.9 g/dL (ref 3.6–5.1)
Alkaline phosphatase (APISO): 69 U/L (ref 37–153)
BUN: 23 mg/dL (ref 7–25)
CO2: 28 mmol/L (ref 20–32)
Calcium: 9.9 mg/dL (ref 8.6–10.4)
Chloride: 105 mmol/L (ref 98–110)
Creat: 0.79 mg/dL (ref 0.50–1.03)
Globulin: 2.4 g/dL (calc) (ref 1.9–3.7)
Glucose, Bld: 91 mg/dL (ref 65–99)
Potassium: 4.6 mmol/L (ref 3.5–5.3)
Sodium: 140 mmol/L (ref 135–146)
Total Bilirubin: 0.3 mg/dL (ref 0.2–1.2)
Total Protein: 6.3 g/dL (ref 6.1–8.1)
eGFR: 87 mL/min/{1.73_m2} (ref >=60)

## 2023-03-25 LAB — SEDIMENTATION RATE: Sed Rate: 39 mm/h — ABNORMAL HIGH (ref 0–30)

## 2023-03-25 NOTE — Telephone Encounter (Signed)
noted 

## 2023-03-26 ENCOUNTER — Other Ambulatory Visit (HOSPITAL_COMMUNITY): Payer: Self-pay

## 2023-03-27 DIAGNOSIS — J189 Pneumonia, unspecified organism: Secondary | ICD-10-CM | POA: Diagnosis not present

## 2023-03-27 DIAGNOSIS — G4733 Obstructive sleep apnea (adult) (pediatric): Secondary | ICD-10-CM | POA: Diagnosis not present

## 2023-03-30 ENCOUNTER — Other Ambulatory Visit (HOSPITAL_COMMUNITY): Payer: Self-pay

## 2023-03-30 ENCOUNTER — Ambulatory Visit (INDEPENDENT_AMBULATORY_CARE_PROVIDER_SITE_OTHER): Payer: Commercial Managed Care - PPO | Admitting: Plastic Surgery

## 2023-03-30 VITALS — BP 120/70 | HR 66 | Ht 65.0 in | Wt 293.0 lb

## 2023-03-30 DIAGNOSIS — Z9884 Bariatric surgery status: Secondary | ICD-10-CM

## 2023-03-30 DIAGNOSIS — N62 Hypertrophy of breast: Secondary | ICD-10-CM

## 2023-03-30 DIAGNOSIS — Z7901 Long term (current) use of anticoagulants: Secondary | ICD-10-CM

## 2023-03-30 DIAGNOSIS — M546 Pain in thoracic spine: Secondary | ICD-10-CM | POA: Diagnosis not present

## 2023-03-30 DIAGNOSIS — M542 Cervicalgia: Secondary | ICD-10-CM

## 2023-03-30 DIAGNOSIS — Z6841 Body Mass Index (BMI) 40.0 and over, adult: Secondary | ICD-10-CM

## 2023-03-30 DIAGNOSIS — I480 Paroxysmal atrial fibrillation: Secondary | ICD-10-CM | POA: Diagnosis not present

## 2023-04-03 ENCOUNTER — Other Ambulatory Visit (HOSPITAL_COMMUNITY): Payer: Self-pay

## 2023-04-06 DIAGNOSIS — D801 Nonfamilial hypogammaglobulinemia: Secondary | ICD-10-CM | POA: Diagnosis not present

## 2023-04-07 ENCOUNTER — Encounter: Payer: Self-pay | Admitting: Plastic Surgery

## 2023-04-07 NOTE — Progress Notes (Signed)
Referring Provider Myrlene Broker, MD [address] [place],  [place] [address]   CC:  Chief Complaint  Patient presents with   Advice Only      Monique Clark is an 57 y.o. female.  HPI: Monique Clark is a 57 year old female who presents today with complaints of upper back and neck pain that she believes are secondary to the large size of her breast.  She is interested in a breast reduction.  The patient has undergone a gastric sleeve for treatment of obesity.  She also has paroxysmal atrial fibrillation.  Allergies  Allergen Reactions   Chlorhexidine Rash   Tetanus-Diphtheria Toxoids Td Swelling    angioedema   Nizatidine Hives    Axid   Promethazine Other (See Comments)    High doses causes confusion   Tetanus Toxoids Other (See Comments)    angioedema    Outpatient Encounter Medications as of 03/30/2023  Medication Sig Note   albuterol (VENTOLIN HFA) 108 (90 Base) MCG/ACT inhaler Inhale 1 puff into the lungs every 6 (six) hours as needed for shortness of breath or wheezing.    calcium carbonate (TUMS) 500 MG chewable tablet Chew 1 tablet by mouth 3 (three) times daily.    Cholecalciferol (VITAMIN D3) 1.25 MG (50000 UT) CAPS Take 1 capsule (1.25 mg total) by mouth 2 (two) times a week as directed (Patient taking differently: Take 50,000 Units by mouth every 14 (fourteen) days. Mondays)    diphenhydrAMINE (BENADRYL) 25 MG tablet Take 25-50 mg by mouth See admin instructions. Take 25 mg by mouth as needed for allergies or itching and take 50 mg by mouth with Hizentra infusion    docusate sodium (COLACE) 100 MG capsule Take 200 mg by mouth at bedtime.    DULoxetine (CYMBALTA) 60 MG capsule Take 1 capsule (60 mg total) by mouth daily.    famotidine (PEPCID) 20 MG tablet Take 20 mg by mouth daily as needed for heartburn or indigestion.    flecainide (TAMBOCOR) 100 MG tablet Take 1 tablet (100 mg total) by mouth every 12 (twelve) hours. (Patient taking differently: Take 100  mg by mouth at bedtime.)    gabapentin (NEURONTIN) 100 MG capsule Take 1 capsule by mouth every night, may increase to 1 capsule by mouth 3 times a day if needed.    Immune Globulin, Human, (HIZENTRA) 4 GM/20ML SOLN 36 grams ( ) subcutaneously every 2 weeks via pump (Patient taking differently: Inject 36 g into the skin every 14 (fourteen) days.) 02/16/2023: Last dose:02/16/23   methocarbamol (ROBAXIN) 500 MG tablet Take 1 tablet (500 mg total) by mouth every 6 (six) hours as needed for muscle spasms.    Multiple Vitamin (MULTIVITAMIN WITH MINERALS) TABS tablet Take 1 tablet by mouth at bedtime.    nebivolol (BYSTOLIC) 5 MG tablet Take 1 tablet (5 mg total) by mouth daily. (Patient taking differently: Take 5 mg by mouth at bedtime.)    NON FORMULARY Pt uses a cpap nightly w/2 liters of oxygen    oxyCODONE-acetaminophen (PERCOCET) 5-325 MG tablet Take 1-2 tablets by mouth every 6 (six) hours as needed for severe pain.    rivaroxaban (XARELTO) 20 MG TABS tablet Take 1 tablet by mouth every day (Patient taking differently: Take 20 mg by mouth every evening.)    Sarilumab (KEVZARA) 200 MG/1. SOAJ Inject 200 mg into the skin every 14 (fourteen) days.    zolpidem (AMBIEN) 5 MG tablet Take 1 tablet (5 mg total) by mouth at bedtime as needed  for sleep. (Patient taking differently: Take 5 mg by mouth at bedtime.)    No facility-administered encounter medications on file as of 03/30/2023.     Past Medical History:  Diagnosis Date   Abscess    Arthritis    Asthma    November 2023- hospitalized   Atrial fibrillation [facility])    Cardiac arrhythmia due to congenital heart disease    trace MR 08/15/20 echo ([place])- pt denies congenital heart disease   CVID (common variable immunodeficiency) (HCC) 2015   takes Hizentra   Diverticulosis    Elevated cholesterol    GERD (gastroesophageal reflux disease)    Headache    High blood pressure    Non Hodgkin's lymphoma (HCC) 1998   Obesity     Personal history of gestational diabetes    PMR (polymyalgia rheumatica) (HCC)    takes Kevzara   Pneumonia    PONV (postoperative nausea and vomiting)    POTS (postural orthostatic tachycardia syndrome)    Pre-diabetes    Sleep apnea    CPAP with 2L O2   UTI (urinary tract infection)     Past Surgical History:  Procedure Laterality Date   ABDOMINAL HYSTERECTOMY     APPENDECTOMY     CESAREAN SECTION     CHOLECYSTECTOMY     COLONOSCOPY     HEMATOMA EVACUATION N/A 03/04/2023   Procedure: EVACUATION HEMATOMA SEROMA WITH RECLOSURE LUMBAR INCISION;  Surgeon: [name], MD;  Location: [facility] OR;  Service: Orthopedics;  Laterality: N/A;   JOINT REPLACEMENT Left    hip   JOINT REPLACEMENT Right    Right Hip 2022   LAPAROSCOPIC GASTRIC SLEEVE RESECTION  04/29/2022   left shoulder repair     rotator cuff repair   LUMBAR LAMINECTOMY N/A 09/18/2021   Procedure: RIGHT LUMBAR FOUR-FIVE MICRODISCECTOMY;  Surgeon: [name], MD;  Location: [facility] OR;  Service: Orthopedics;  Laterality: N/A;   LUMBAR LAMINECTOMY/DECOMPRESSION MICRODISCECTOMY N/A 02/27/2023   Procedure: L4-5 LUMBAR DECOMPRESSION, MICRODISCECTOMY;  Surgeon: [name], MD;  Location: [facility] OR;  Service: Orthopedics;  Laterality: N/A;   LUNG REMOVAL, PARTIAL Right 2009   VATS, wedge resection partial lobectomy, done at [facility]   myringostomy Bilateral    Myringotomy   SPLENECTOMY, TOTAL  1998   TONSILLECTOMY AND ADENOIDECTOMY     UPPER GI ENDOSCOPY N/A 04/29/2022   Procedure: UPPER GI ENDOSCOPY;  Surgeon: Sheliah Hatch, De Blanch, MD;  Location: [facility] ORS;  Service: General;  Laterality: N/A;    Family History  Problem Relation Age of Onset   Atrial fibrillation Mother    Colon cancer Father    Cancer Father        Colon   Colon cancer Sister    Basal cell carcinoma Brother    Atrial fibrillation Maternal Uncle    Atrial fibrillation Maternal Grandmother    Atrial fibrillation Maternal Grandfather    Heart attack  Maternal Grandfather    Ovarian cancer Neg Hx    Stomach cancer Neg Hx    Rectal cancer Neg Hx     Social History   Social History Narrative   Not on file     Review of Systems General: Denies fevers, chills, weight loss CV: Denies chest pain, shortness of breath, palpitations Patient has large breasts which she feels is contributing to her upper back and neck pain.  Physical Exam    03/30/2023   11:09 AM 03/24/2023    2:57 PM 03/24/2023   10:44 AM  Vitals  with BMI  Height 5\' 5"  5\' 5"  5\' 5"   Weight 293 lbs 290 lbs 290 lbs  BMI 48.76 48.26 48.26  Systolic 120  116  Diastolic 70  72  Pulse 66  52    General:  No acute distress,  Alert and oriented, Non-Toxic, Normal speech and affect Breast: Patient is not examined on this visit Mammogram: No mammograms are available since 2016 Assessment/Plan Macromastia: Patient is currently at a BMI of 48.  We discussed this and the fact that I prefer not to perform breast reductions on patients with a BMI greater than 40 and occasionally 42 due to the increased risk of surgical complications.  Additionally the patient is on Xarelto.  She will need to have this stopped 3 to 5 days prior to surgery and will not be able to resume it for 3 to 5 days after surgery.  This will need to be cleared with her cardiologist once her weight makes her an acceptable candidate for breast reduction.  Will follow-up once her weight approach is a BMI of 42.  Santiago Glad 04/07/2023, 11:25 AM

## 2023-04-09 ENCOUNTER — Telehealth: Payer: Self-pay | Admitting: Orthopaedic Surgery

## 2023-04-09 NOTE — Telephone Encounter (Signed)
Patient called. Would like to go back to work 7/18. Remote work. Her cb # [phone]

## 2023-04-10 ENCOUNTER — Other Ambulatory Visit (HOSPITAL_COMMUNITY): Payer: Self-pay

## 2023-04-10 NOTE — Telephone Encounter (Signed)
Note completed 

## 2023-04-10 NOTE — Telephone Encounter (Signed)
Sent to [employer]. Called and advised pt

## 2023-04-12 DIAGNOSIS — G4733 Obstructive sleep apnea (adult) (pediatric): Secondary | ICD-10-CM | POA: Diagnosis not present

## 2023-04-16 ENCOUNTER — Other Ambulatory Visit: Payer: Self-pay | Admitting: Physician Assistant

## 2023-04-16 ENCOUNTER — Other Ambulatory Visit: Payer: Self-pay | Admitting: Orthopaedic Surgery

## 2023-04-16 ENCOUNTER — Other Ambulatory Visit: Payer: Self-pay | Admitting: Internal Medicine

## 2023-04-17 ENCOUNTER — Other Ambulatory Visit (HOSPITAL_BASED_OUTPATIENT_CLINIC_OR_DEPARTMENT_OTHER): Payer: Self-pay

## 2023-04-17 ENCOUNTER — Other Ambulatory Visit: Payer: Self-pay

## 2023-04-17 ENCOUNTER — Other Ambulatory Visit (HOSPITAL_COMMUNITY): Payer: Self-pay

## 2023-04-17 MED ORDER — METHOCARBAMOL 500 MG PO TABS
500.0000 mg | ORAL_TABLET | Freq: Four times a day (QID) | ORAL | 0 refills | Status: DC | PRN
  Filled 2023-04-17: qty 30, 8d supply, fill #0

## 2023-04-19 ENCOUNTER — Encounter: Payer: Self-pay | Admitting: Internal Medicine

## 2023-04-19 DIAGNOSIS — D801 Nonfamilial hypogammaglobulinemia: Secondary | ICD-10-CM | POA: Diagnosis not present

## 2023-04-20 ENCOUNTER — Other Ambulatory Visit (HOSPITAL_COMMUNITY): Payer: Self-pay

## 2023-04-20 ENCOUNTER — Other Ambulatory Visit: Payer: Self-pay

## 2023-04-20 MED ORDER — ZOLPIDEM TARTRATE 5 MG PO TABS
5.0000 mg | ORAL_TABLET | Freq: Every evening | ORAL | 5 refills | Status: DC | PRN
  Filled 2023-04-20 – 2023-04-22 (×3): qty 30, 30d supply, fill #0

## 2023-04-21 ENCOUNTER — Encounter: Payer: Self-pay | Admitting: Internal Medicine

## 2023-04-21 ENCOUNTER — Telehealth (INDEPENDENT_AMBULATORY_CARE_PROVIDER_SITE_OTHER): Payer: Commercial Managed Care - PPO | Admitting: Internal Medicine

## 2023-04-21 DIAGNOSIS — U071 COVID-19: Secondary | ICD-10-CM | POA: Diagnosis not present

## 2023-04-21 MED ORDER — HYDROCODONE BIT-HOMATROP MBR 5-1.5 MG/5ML PO SOLN: 5.0000 mL | Freq: Three times a day (TID) | ORAL | 0 refills | Status: DC | PRN

## 2023-04-21 NOTE — Progress Notes (Unsigned)
Virtual Visit via Video Note  I connected with Monique Clark on 04/21/23 at 10:40 AM EDT by a video enabled telemedicine application and verified that I am speaking with the correct person using two identifiers.  The patient and the provider were at separate locations throughout the entire encounter. Patient location: home, Provider location: work   I discussed the limitations of evaluation and management by telemedicine and the availability of in person appointments. The patient expressed understanding and agreed to proceed. The patient and the provider were the only parties present for the visit unless noted in HPI below.  History of Present Illness: The patient is a 57 y.o. female with visit for covid-19 positive. Started 3 days ago. Did Igg Sunday. Has cold symptoms no SOB or wheezing.   Observations/Objective: Appearance: normal, breathing appears normal, some congestion  Assessment and Plan: See problem oriented charting  Follow Up Instructions: discussed contraindication to paxlovid and lack of current efficacy to molnupiravir so likely would not add benefit to treatment. Rx hycodan cough syrup continue vitamin c and zinc  Visit time 15 minutes in face to face communication with patient and coordination of care, additional 5 minutes spent in record review, coordination or care, ordering tests, communicating/referring to other healthcare professionals, documenting in medical records all on the same day of the visit for total time 20 minutes spent on the visit.   I discussed the assessment and treatment plan with the patient. The patient was provided an opportunity to ask questions and all were answered. The patient agreed with the plan and demonstrated an understanding of the instructions.   The patient was advised to call back or seek an in-person evaluation if the symptoms worsen or if the condition fails to improve as anticipated.  Myrlene Broker, MD

## 2023-04-22 ENCOUNTER — Other Ambulatory Visit (HOSPITAL_COMMUNITY): Payer: Self-pay

## 2023-04-23 NOTE — Assessment & Plan Note (Signed)
Given immune deficiency she does qualify for treatment however has contraindication to paxlovid. We discussed that molnupiravir does not have good efficacy and may have side effects. We decided through shared decision making that we will not prescribe. Rx hycodan cough syrup to help and continue vitamin C and zinc.

## 2023-04-25 ENCOUNTER — Other Ambulatory Visit (HOSPITAL_COMMUNITY): Payer: Self-pay

## 2023-04-26 DIAGNOSIS — G4733 Obstructive sleep apnea (adult) (pediatric): Secondary | ICD-10-CM | POA: Diagnosis not present

## 2023-04-26 DIAGNOSIS — J189 Pneumonia, unspecified organism: Secondary | ICD-10-CM | POA: Diagnosis not present

## 2023-04-30 ENCOUNTER — Telehealth: Payer: Self-pay | Admitting: Orthopaedic Surgery

## 2023-04-30 NOTE — Telephone Encounter (Signed)
Pt is Post op  EVACUATION HEMATOMA SEROMA WITH RECLOSURE LUMBAR INCISION 03/04/2023 Pt was supposed to make follow up around 07/25 and did not she needs to set an appt with a provider to check her out please advise on who she can follow up with and when

## 2023-04-30 NOTE — Telephone Encounter (Signed)
[place] forms received. To [employer]. 

## 2023-05-01 ENCOUNTER — Other Ambulatory Visit: Payer: Self-pay

## 2023-05-03 DIAGNOSIS — D801 Nonfamilial hypogammaglobulinemia: Secondary | ICD-10-CM | POA: Diagnosis not present

## 2023-05-06 ENCOUNTER — Other Ambulatory Visit (HOSPITAL_COMMUNITY): Payer: Self-pay

## 2023-05-06 ENCOUNTER — Other Ambulatory Visit: Payer: Self-pay

## 2023-05-07 ENCOUNTER — Other Ambulatory Visit (HOSPITAL_COMMUNITY): Payer: Self-pay

## 2023-05-08 ENCOUNTER — Other Ambulatory Visit (HOSPITAL_COMMUNITY): Payer: Self-pay

## 2023-05-09 ENCOUNTER — Other Ambulatory Visit (HOSPITAL_COMMUNITY): Payer: Self-pay

## 2023-05-11 ENCOUNTER — Encounter: Payer: Self-pay | Admitting: Internal Medicine

## 2023-05-12 ENCOUNTER — Other Ambulatory Visit: Payer: Self-pay | Admitting: Physician Assistant

## 2023-05-12 ENCOUNTER — Other Ambulatory Visit (HOSPITAL_COMMUNITY): Payer: Self-pay

## 2023-05-13 DIAGNOSIS — G4733 Obstructive sleep apnea (adult) (pediatric): Secondary | ICD-10-CM | POA: Diagnosis not present

## 2023-05-15 ENCOUNTER — Other Ambulatory Visit: Payer: Self-pay

## 2023-05-15 ENCOUNTER — Telehealth: Payer: Self-pay

## 2023-05-15 ENCOUNTER — Other Ambulatory Visit: Payer: Self-pay | Admitting: Physician Assistant

## 2023-05-15 ENCOUNTER — Other Ambulatory Visit: Payer: Self-pay | Admitting: Orthopedic Surgery

## 2023-05-15 ENCOUNTER — Other Ambulatory Visit (HOSPITAL_COMMUNITY): Payer: Self-pay

## 2023-05-15 ENCOUNTER — Telehealth: Payer: Self-pay | Admitting: Orthopaedic Surgery

## 2023-05-15 MED ORDER — METHOCARBAMOL 500 MG PO TABS
500.0000 mg | ORAL_TABLET | Freq: Four times a day (QID) | ORAL | 0 refills | Status: DC | PRN
  Filled 2023-05-15 – 2023-05-19 (×2): qty 30, 8d supply, fill #0

## 2023-05-15 MED ORDER — METHYLPREDNISOLONE 4 MG PO TBPK
ORAL_TABLET | ORAL | 0 refills | Status: DC
  Filled 2023-05-15 – 2023-09-08 (×4): qty 21, 6d supply, fill #0

## 2023-05-15 NOTE — Addendum Note (Signed)
Addended by: Willia Craze on: 05/15/2023 12:52 PM   Modules accepted: Orders

## 2023-05-15 NOTE — Telephone Encounter (Signed)
This is a [employer] patient.  Can you ask Dr. Steward Drone to advise?  Thank you.   Patient called stating that she stepped wrong and that her back went out last night and now she is having low back pain and thigh numbness.  Stated that she has taken Tylenol, Motrin, and Voltaren.  Would like to know what can be done?  Cb# [phone] or [phone].  Please advise.  Thank you.

## 2023-05-15 NOTE — Telephone Encounter (Signed)
Called and left a Vm advising patient of message below, per Dr. Christell Constant and to [facility] with any questions.

## 2023-05-16 ENCOUNTER — Other Ambulatory Visit: Payer: Self-pay

## 2023-05-16 ENCOUNTER — Encounter (HOSPITAL_COMMUNITY): Payer: Self-pay

## 2023-05-16 ENCOUNTER — Emergency Department (HOSPITAL_COMMUNITY)
Admission: EM | Admit: 2023-05-16 | Discharge: 2023-05-17 | Disposition: A | Discharge status: Home / Self Care | Payer: Commercial Managed Care - PPO | Attending: Emergency Medicine | Admitting: Emergency Medicine

## 2023-05-16 DIAGNOSIS — M5136 Other intervertebral disc degeneration, lumbar region: Secondary | ICD-10-CM | POA: Diagnosis not present

## 2023-05-16 DIAGNOSIS — M545 Low back pain, unspecified: Secondary | ICD-10-CM | POA: Diagnosis not present

## 2023-05-16 DIAGNOSIS — J45909 Unspecified asthma, uncomplicated: Secondary | ICD-10-CM | POA: Diagnosis not present

## 2023-05-16 DIAGNOSIS — G8929 Other chronic pain: Secondary | ICD-10-CM | POA: Insufficient documentation

## 2023-05-16 DIAGNOSIS — S3992XA Unspecified injury of lower back, initial encounter: Secondary | ICD-10-CM | POA: Diagnosis not present

## 2023-05-16 DIAGNOSIS — Z7951 Long term (current) use of inhaled steroids: Secondary | ICD-10-CM | POA: Insufficient documentation

## 2023-05-16 DIAGNOSIS — M48061 Spinal stenosis, lumbar region without neurogenic claudication: Secondary | ICD-10-CM | POA: Diagnosis not present

## 2023-05-16 DIAGNOSIS — Z79899 Other long term (current) drug therapy: Secondary | ICD-10-CM | POA: Insufficient documentation

## 2023-05-16 DIAGNOSIS — I4891 Unspecified atrial fibrillation: Secondary | ICD-10-CM | POA: Insufficient documentation

## 2023-05-16 DIAGNOSIS — R209 Unspecified disturbances of skin sensation: Secondary | ICD-10-CM | POA: Insufficient documentation

## 2023-05-16 DIAGNOSIS — I10 Essential (primary) hypertension: Secondary | ICD-10-CM | POA: Diagnosis not present

## 2023-05-16 LAB — CBC WITH DIFFERENTIAL/PLATELET
Abs Immature Granulocytes: 0.09 10*3/uL — ABNORMAL HIGH (ref 0.00–0.07)
Basophils Absolute: 0.1 10*3/uL (ref 0.0–0.1)
Basophils Relative: 0 %
Eosinophils Absolute: 0.1 10*3/uL (ref 0.0–0.5)
Eosinophils Relative: 0 %
HCT: 37 % (ref 36.0–46.0)
Hemoglobin: 11.7 g/dL — ABNORMAL LOW (ref 12.0–15.0)
Immature Granulocytes: 1 %
Lymphocytes Relative: 30 %
Lymphs Abs: 4.7 10*3/uL — ABNORMAL HIGH (ref 0.7–4.0)
MCH: 28.2 pg (ref 26.0–34.0)
MCHC: 31.6 g/dL (ref 30.0–36.0)
MCV: 89.2 fL (ref 80.0–100.0)
Monocytes Absolute: 1.8 10*3/uL — ABNORMAL HIGH (ref 0.1–1.0)
Monocytes Relative: 11 %
Neutro Abs: 9 10*3/uL — ABNORMAL HIGH (ref 1.7–7.7)
Neutrophils Relative %: 58 %
Platelets: 369 10*3/uL (ref 150–400)
RBC: 4.15 MIL/uL (ref 3.87–5.11)
RDW: 15.2 % (ref 11.5–15.5)
WBC: 15.7 10*3/uL — ABNORMAL HIGH (ref 4.0–10.5)
nRBC: 0 % (ref 0.0–0.2)

## 2023-05-16 LAB — BASIC METABOLIC PANEL
Anion gap: 10 (ref 5–15)
BUN: 17 mg/dL (ref 6–20)
CO2: 26 mmol/L (ref 22–32)
Calcium: 9.2 mg/dL (ref 8.9–10.3)
Chloride: 103 mmol/L (ref 98–111)
Creatinine, Ser: 0.78 mg/dL (ref 0.44–1.00)
GFR, Estimated: >60 mL/min (ref >60)
Glucose, Bld: 106 mg/dL — ABNORMAL HIGH (ref 70–99)
Potassium: 3.9 mmol/L (ref 3.5–5.1)
Sodium: 139 mmol/L (ref 135–145)

## 2023-05-16 LAB — URINALYSIS, ROUTINE W REFLEX MICROSCOPIC
Bacteria, UA: NONE SEEN
Bilirubin Urine: NEGATIVE
Glucose, UA: NEGATIVE mg/dL
Hgb urine dipstick: NEGATIVE
Ketones, ur: NEGATIVE mg/dL
Leukocytes,Ua: NEGATIVE
Nitrite: NEGATIVE
Protein, ur: NEGATIVE mg/dL
Specific Gravity, Urine: 1.011 (ref 1.005–1.030)
pH: 6 (ref 5.0–8.0)

## 2023-05-16 NOTE — ED Triage Notes (Signed)
Pt reports lumbar back pain, reports chronic back pain, had ruptured herniated disc and had back surgery June 5th, and then again for a washout on June 25th after developing a hematoma seroma.   She also reports right anterior thigh numbness and burning to left foot.

## 2023-05-17 ENCOUNTER — Emergency Department (HOSPITAL_COMMUNITY): Payer: Commercial Managed Care - PPO

## 2023-05-17 DIAGNOSIS — M48061 Spinal stenosis, lumbar region without neurogenic claudication: Secondary | ICD-10-CM | POA: Diagnosis not present

## 2023-05-17 DIAGNOSIS — S3992XA Unspecified injury of lower back, initial encounter: Secondary | ICD-10-CM | POA: Diagnosis not present

## 2023-05-17 DIAGNOSIS — M5136 Other intervertebral disc degeneration, lumbar region: Secondary | ICD-10-CM | POA: Diagnosis not present

## 2023-05-17 DIAGNOSIS — M545 Low back pain, unspecified: Secondary | ICD-10-CM | POA: Diagnosis not present

## 2023-05-17 MED ORDER — DICLOFENAC SODIUM ER 100 MG PO TB24: 100.0000 mg | ORAL_TABLET | Freq: Every day | ORAL | 0 refills | Status: DC

## 2023-05-17 MED ORDER — FENTANYL CITRATE PF 50 MCG/ML IJ SOSY
50.0000 ug | PREFILLED_SYRINGE | Freq: Once | INTRAMUSCULAR | Status: AC
  Administered 2023-05-17: 50 ug via INTRAVENOUS
  Filled 2023-05-17: qty 1

## 2023-05-17 MED ORDER — METHOCARBAMOL 500 MG PO TABS: 500.0000 mg | ORAL_TABLET | Freq: Two times a day (BID) | ORAL | 0 refills | Status: DC

## 2023-05-17 MED ORDER — GADOBUTROL 1 MMOL/ML IV SOLN
10.0000 mL | Freq: Once | INTRAVENOUS | Status: AC | PRN
  Administered 2023-05-17: 10 mL via INTRAVENOUS

## 2023-05-17 MED ORDER — SODIUM CHLORIDE 0.9 % IV SOLN: INTRAVENOUS | Status: DC

## 2023-05-17 MED ORDER — LIDOCAINE 5 % EX PTCH: 1.0000 | MEDICATED_PATCH | CUTANEOUS | 0 refills | Status: DC

## 2023-05-17 MED ORDER — MAGNESIUM SULFATE 2 GM/50ML IV SOLN
2.0000 g | Freq: Once | INTRAVENOUS | Status: AC
  Administered 2023-05-17: 2 g via INTRAVENOUS
  Filled 2023-05-17: qty 50

## 2023-05-17 MED ORDER — LORAZEPAM 2 MG/ML IJ SOLN
1.0000 mg | Freq: Once | INTRAMUSCULAR | Status: AC
  Administered 2023-05-17: 1 mg via INTRAVENOUS
  Filled 2023-05-17: qty 1

## 2023-05-17 MED ORDER — SODIUM CHLORIDE 0.9 % IV BOLUS
500.0000 mL | Freq: Once | INTRAVENOUS | Status: AC
  Administered 2023-05-17: 500 mL via INTRAVENOUS

## 2023-05-17 MED ORDER — METHOCARBAMOL 1000 MG/10ML IJ SOLN
1000.0000 mg | INTRAVENOUS | Status: AC
  Administered 2023-05-17: 1000 mg via INTRAVENOUS
  Filled 2023-05-17: qty 10

## 2023-05-17 NOTE — ED Provider Notes (Addendum)
[place] EMERGENCY DEPARTMENT AT [facility] Provider Note   CSN: [id number] Arrival date & time: 05/16/23  2132     History  Chief Complaint  Patient presents with   Back Pain    Monique Clark is a 57 y.o. female.   Back Pain Location:  Lumbar spine Quality:  Shooting Radiates to:  R thigh Pain severity:  Severe Pain is:  Same all the time Timing:  Constant Progression:  Worsening Chronicity:  Recurrent Context: not MVA   Relieved by:  Nothing Worsened by:  Nothing Ineffective treatments: steroids. Associated symptoms: paresthesias   Associated symptoms: no abdominal swelling, no bladder incontinence, no bowel incontinence, no fever, no pelvic pain, no perianal numbness, no weakness and no weight loss   Risk factors: no vascular disease   Patient with known lower back pain who is s/p lumbar spine surgery (may 2024) and then seroma evacuation by Dr. Ophelia Charter in June presents with worsening back pain and R thigh sensation changes.  Was placed on steroids this week.      Past Medical History:  Diagnosis Date   Abscess    Arthritis    Asthma    November 2023- hospitalized   Atrial fibrillation [facility])    Cardiac arrhythmia due to congenital heart disease    trace MR 08/15/20 echo ([place])- pt denies congenital heart disease   CVID (common variable immunodeficiency) (HCC) 2015   takes Hizentra   Diverticulosis    Elevated cholesterol    GERD (gastroesophageal reflux disease)    Headache    High blood pressure    Non Hodgkin's lymphoma (HCC) 1998   Obesity    Personal history of gestational diabetes    PMR (polymyalgia rheumatica) (HCC)    takes Kevzara   Pneumonia    PONV (postoperative nausea and vomiting)    POTS (postural orthostatic tachycardia syndrome)    Pre-diabetes    Sleep apnea    CPAP with 2L O2   UTI (urinary tract infection)      Home Medications Prior to Admission medications   Medication Sig Start Date End Date  Taking? Authorizing Provider  albuterol (VENTOLIN HFA) 108 (90 Base) MCG/ACT inhaler Inhale 1 puff into the lungs every 6 (six) hours as needed for shortness of breath or wheezing. 08/14/21   [provider]  calcium carbonate (TUMS) 500 MG chewable tablet Chew 1 tablet by mouth 3 (three) times daily.    [provider]  Cholecalciferol (VITAMIN D3) 1.25 MG (50000 UT) CAPS Take 1 capsule (1.25 mg total) by mouth 2 (two) times a week as directed Patient taking differently: Take 50,000 Units by mouth every 14 (fourteen) days. Mondays 10/27/22   Myrlene Broker, MD  diphenhydrAMINE (BENADRYL) 25 MG tablet Take 25-50 mg by mouth See admin instructions. Take 25 mg by mouth as needed for allergies or itching and take 50 mg by mouth with Hizentra infusion    [provider]  docusate sodium (COLACE) 100 MG capsule Take 200 mg by mouth at bedtime.    [provider]  DULoxetine (CYMBALTA) 60 MG capsule Take 1 capsule (60 mg total) by mouth daily. 03/16/23   Myrlene Broker, MD  famotidine (PEPCID) 20 MG tablet Take 20 mg by mouth daily as needed for heartburn or indigestion.    [provider]  flecainide (TAMBOCOR) 100 MG tablet Take 1 tablet (100 mg total) by mouth every 12 (twelve) hours. Patient taking differently: Take 100 mg by mouth at bedtime.  12/05/22     gabapentin (NEURONTIN) 100 MG capsule Take 1 capsule by mouth every night, may increase to 1 capsule by mouth 3 times a day if needed. 03/13/23   Cristie Hem, PA-C  HYDROcodone bit-homatropine (HYCODAN) 5-1.5 MG/5ML syrup Take 5 mLs by mouth every 8 (eight) hours as needed for cough. 04/21/23   Myrlene Broker, MD  Immune Globulin, Human, (HIZENTRA) 4 GM/20ML SOLN 36 grams ( ) subcutaneously every 2 weeks via pump Patient taking differently: Inject 36 g into the skin every 14 (fourteen) days. 11/17/22     methocarbamol (ROBAXIN) 500 MG tablet Take 1 tablet (500 mg total) by mouth every  6 (six) hours as needed for muscle spasms. 05/15/23   London Sheer, MD  methylPREDNISolone (MEDROL DOSEPAK) 4 MG TBPK tablet Take as prescribed on the box 05/15/23   London Sheer, MD  Multiple Vitamin (MULTIVITAMIN WITH MINERALS) TABS tablet Take 1 tablet by mouth at bedtime.    [provider]  nebivolol (BYSTOLIC) 5 MG tablet Take 1 tablet (5 mg total) by mouth daily. Patient taking differently: Take 5 mg by mouth at bedtime. 02/09/23     NON FORMULARY Pt uses a cpap nightly w/2 liters of oxygen    [provider]  oxyCODONE-acetaminophen (PERCOCET) 5-325 MG tablet Take 1-2 tablets by mouth every 6 (six) hours as needed for severe pain. 03/24/23   [name], MD  rivaroxaban (XARELTO) 20 MG TABS tablet Take 1 tablet by mouth every day Patient taking differently: Take 20 mg by mouth every evening. 04/02/22     Sarilumab (KEVZARA) 200 MG/1. SOAJ Inject 200 mg into the skin every 14 (fourteen) days. 02/24/23   Fuller Plan, MD  zolpidem (AMBIEN) 5 MG tablet Take 1 tablet (5 mg total) by mouth at bedtime as needed for sleep. 04/20/23   Myrlene Broker, MD      Allergies    Chlorhexidine, Tetanus-diphtheria toxoids td, Nizatidine, Promethazine, and Tetanus toxoids    Review of Systems   Review of Systems  Constitutional:  Negative for fever and weight loss.  HENT:  Negative for facial swelling.   Respiratory:  Negative for wheezing and stridor.   Gastrointestinal:  Negative for bowel incontinence.  Genitourinary:  Negative for bladder incontinence, difficulty urinating and pelvic pain.  Musculoskeletal:  Positive for back pain.  Neurological:  Positive for paresthesias. Negative for weakness.    Physical Exam Updated Vital Signs BP (!) 145/61 (BP Location: Right Arm)   Pulse (!) 51   Temp 98 F (36.7 C)   Resp 18   Ht 5\' 5"  (1.651 m)   Wt 130.6 kg   SpO2 96%   BMI 47.93 kg/m  Physical Exam Vitals and nursing note reviewed.  Constitutional:       General: She is not in acute distress.    Appearance: She is well-developed.  HENT:     Head: Normocephalic and atraumatic.     Nose: Nose normal.  Eyes:     Pupils: Pupils are equal, round, and reactive to light.  Cardiovascular:     Rate and Rhythm: Normal rate and regular rhythm.     Pulses: Normal pulses.     Heart sounds: Normal heart sounds.  Pulmonary:     Effort: Pulmonary effort is normal. No respiratory distress.     Breath sounds: Normal breath sounds.  Abdominal:     General: Bowel sounds are normal. There is no distension.     Palpations:  Abdomen is soft.     Tenderness: There is no abdominal tenderness. There is no guarding or rebound.  Musculoskeletal:        General: Normal range of motion.     Cervical back: Neck supple.  Skin:    General: Skin is warm and dry.     Capillary Refill: Capillary refill takes less than 2 seconds.     Findings: No erythema or rash.  Neurological:     General: No focal deficit present.     Motor: No weakness.     Deep Tendon Reflexes: Reflexes normal.     Comments: Intact L5/s1   Psychiatric:        Mood and Affect: Mood normal.     ED Results / Procedures / Treatments   Labs (all labs ordered are listed, but only abnormal results are displayed) Results for orders placed or performed during the hospital encounter of 05/16/23  Basic metabolic panel  Result Value Ref Range   Sodium 139 135 - 145 mmol/L   Potassium 3.9 3.5 - 5.1 mmol/L   Chloride 103 98 - 111 mmol/L   CO2 26 22 - 32 mmol/L   Glucose, Bld 106 (H) 70 - 99 mg/dL   BUN 17 6 - 20 mg/dL   Creatinine, Ser 1.61 0.44 - 1.00 mg/dL   Calcium 9.2 8.9 - 09.6 mg/dL   GFR, Estimated >04 >54 mL/min   Anion gap 10 5 - 15  CBC with Differential  Result Value Ref Range   WBC 15.7 (H) 4.0 - 10.5 K/uL   RBC 4.15 3.87 - 5.11 MIL/uL   Hemoglobin 11.7 (L) 12.0 - 15.0 g/dL   HCT 09.8 11.9 - 14.7 %   MCV 89.2 80.0 - 100.0 fL   MCH 28.2 26.0 - 34.0 pg   MCHC 31.6 30.0 -  36.0 g/dL   RDW 82.9 56.2 - 13.0 %   Platelets 369 150 - 400 K/uL   nRBC 0.0 0.0 - 0.2 %   Neutrophils Relative % 58 %   Neutro Abs 9.0 (H) 1.7 - 7.7 K/uL   Lymphocytes Relative 30 %   Lymphs Abs 4.7 (H) 0.7 - 4.0 K/uL   Monocytes Relative 11 %   Monocytes Absolute 1.8 (H) 0.1 - 1.0 K/uL   Eosinophils Relative 0 %   Eosinophils Absolute 0.1 0.0 - 0.5 K/uL   Basophils Relative 0 %   Basophils Absolute 0.1 0.0 - 0.1 K/uL   Immature Granulocytes 1 %   Abs Immature Granulocytes 0.09 (H) 0.00 - 0.07 K/uL  Urinalysis, Routine w reflex microscopic -Urine, Clean Catch  Result Value Ref Range   Color, Urine YELLOW YELLOW   APPearance CLEAR CLEAR   Specific Gravity, Urine 1.011 1.005 - 1.030   pH 6.0 5.0 - 8.0   Glucose, UA NEGATIVE NEGATIVE mg/dL   Hgb urine dipstick NEGATIVE NEGATIVE   Bilirubin Urine NEGATIVE NEGATIVE   Ketones, ur NEGATIVE NEGATIVE mg/dL   Protein, ur NEGATIVE NEGATIVE mg/dL   Nitrite NEGATIVE NEGATIVE   Leukocytes,Ua NEGATIVE NEGATIVE   RBC / HPF 0-5 0 - 5 RBC/hpf   WBC, UA 0-5 0 - 5 WBC/hpf   Bacteria, UA NONE SEEN NONE SEEN   Squamous Epithelial / HPF 0-5 0 - 5 /HPF   No results found.   Radiology No results found.  Procedures Procedures    Medications Ordered in ED Medications  0.9 %  sodium chloride infusion (0 mLs Intravenous Hold 05/17/23 0322)  LORazepam (ATIVAN) injection  1 mg (has no administration in time range)  sodium chloride 0.9 % bolus 500 mL (0 mLs Intravenous Stopped 05/17/23 0459)  magnesium sulfate IVPB 2 g 50 mL (0 g Intravenous Stopped 05/17/23 0400)  fentaNYL (SUBLIMAZE) injection 50 mcg (50 mcg Intravenous Given 05/17/23 0319)  methocarbamol (ROBAXIN) 1,000 mg in dextrose 5 % 100 mL IVPB (0 mg Intravenous Stopped 05/17/23 0458)    ED Course/ Medical Decision Making/ A&P                                 Medical Decision Making Patient with back pain who is status post back surgery and then seroma evacuation   Amount and/or  Complexity of Data Reviewed External Data Reviewed: notes.    Details: Previous notes reviewed  Labs: ordered.    Details: Urine negative for UTI. White count 15.7 (just started steroids which increases white count), low hemoglobin 11.7, normal platelet count.  Normal sodium 139, normal potassium 3.9, normal creatinine.   Radiology: ordered.  Risk Prescription drug management. Risk Details: Pain markedly improved post medication. 5/5 BLE strength intact DTRs. No cauda equina.  MRI without acute finding.  Patient to be started on muscle relaxants and will need to follow up with her spine surgeon as an outpatient. Stable for discharge.  Strict return     Final Clinical Impression(s) / ED Diagnoses Final diagnoses:  Low back pain without sciatica, unspecified back pain laterality, unspecified chronicity  Paresthesia   Return for intractable cough, coughing up blood, fevers > 100.4 unrelieved by medication, shortness of breath, intractable vomiting, chest pain, shortness of breath, weakness, numbness, changes in speech, facial asymmetry, abdominal pain, passing out, Inability to tolerate liquids or food, cough, altered mental status or any concerns. No signs of systemic illness or infection. The patient is nontoxic-appearing on exam and vital signs are within normal limits.  I have reviewed the triage vital signs and the nursing notes. Pertinent labs & imaging results that were available during my care of the patient were reviewed by me and considered in my medical decision making (see chart for details). After history, exam, and medical workup I feel the patient has been appropriately medically screened and is safe for discharge home. Pertinent diagnoses were discussed with the patient. Patient was given return precautions.     Artemus Romanoff, MD 05/17/23 [phone]

## 2023-05-18 ENCOUNTER — Other Ambulatory Visit: Payer: Self-pay

## 2023-05-18 ENCOUNTER — Other Ambulatory Visit: Payer: Self-pay | Admitting: Physician Assistant

## 2023-05-18 ENCOUNTER — Other Ambulatory Visit (HOSPITAL_COMMUNITY): Payer: Self-pay

## 2023-05-19 ENCOUNTER — Other Ambulatory Visit (HOSPITAL_COMMUNITY): Payer: Self-pay

## 2023-05-19 ENCOUNTER — Telehealth: Payer: Self-pay

## 2023-05-19 NOTE — Patient Instructions (Incomplete)
Please continue using your CPAP regularly. While your insurance requires that you use CPAP at least 4 hours each night on 70% of the nights, I recommend, that you not skip any nights and use it throughout the night if you can. Getting used to CPAP and staying with the treatment long term does take time and patience and discipline. Untreated obstructive sleep apnea when it is moderate to severe can have an adverse impact on cardiovascular health and raise her risk for heart disease, arrhythmias, hypertension, congestive heart failure, stroke and diabetes. Untreated obstructive sleep apnea causes sleep disruption, nonrestorative sleep, and sleep deprivation. This can have an impact on your day to day functioning and cause daytime sleepiness and impairment of cognitive function, memory loss, mood disturbance, and problems focussing. Using CPAP regularly can improve these symptoms.  We will update supply orders, today. Try to increase four hour usage. Talk to PCP if headaches continue.   Follow up in 1 year   GENERAL HEADACHE INFORMATION:   Natural supplements: Magnesium Oxide or Magnesium Glycinate 500 mg at bed (up to 800 mg daily) Coenzyme Q10 300 mg in AM Vitamin B2- 200 mg twice a day   Add 1 supplement at a time since even natural supplements can have undesirable side effects. You can sometimes buy supplements cheaper (especially Coenzyme Q10) at www.WebmailGuide.co.za or at [facility].  Migraine with aura: There is increased risk for stroke in women with migraine with aura and a contraindication for the combined contraceptive pill for use by women who have migraine with aura. The risk for women with migraine without aura is lower. However other risk factors like smoking are far more likely to increase stroke risk than migraine. There is a recommendation for no smoking and for the use of OCPs without estrogen such as progestogen only pills particularly for women with migraine with aura.Marland Kitchen People who have  migraine headaches with auras may be 3 times more likely to have a stroke caused by a blood clot, compared to migraine patients who don't see auras. Women who take hormone-replacement therapy may be 30 percent more likely to suffer a clot-based stroke than women not taking medication containing estrogen. Other risk factors like smoking and high blood pressure may be  much more important.    Vitamins and herbs that show potential:   Magnesium: Magnesium (250 mg twice a day or 500 mg at bed) has a relaxant effect on smooth muscles such as blood vessels. Individuals suffering from frequent or daily headache usually have low magnesium levels which can be increase with daily supplementation of 400-750 mg. Three trials found 40-90% average headache reduction  when used as a preventative. Magnesium may help with headaches are aura, the best evidence for magnesium is for migraine with aura is its thought to stop the cortical spreading depression we believe is the pathophysiology of migraine aura.Magnesium also demonstrated the benefit in menstrually related migraine.  Magnesium is part of the messenger system in the serotonin cascade and it is a good muscle relaxant.  It is also useful for constipation which can be a side effect of other medications used to treat migraine. Good sources include nuts, whole grains, and tomatoes. Side Effects: loose stool/diarrhea  Riboflavin (vitamin B 2) 200 mg twice a day. This vitamin assists nerve cells in the production of ATP a principal energy storing molecule.  It is necessary for many chemical reactions in the body.  There have been at least 3 clinical trials of riboflavin using 400 mg per  day all of which suggested that migraine frequency can be decreased.  All 3 trials showed significant improvement in over half of migraine sufferers.  The supplement is found in bread, cereal, milk, meat, and poultry.  Most Americans get more riboflavin than the recommended daily allowance,  however riboflavin deficiency is not necessary for the supplements to help prevent headache. Side effects: energizing, green urine   Coenzyme Q10: This is present in almost all cells in the body and is critical component for the conversion of energy.  Recent studies have shown that a nutritional supplement of CoQ10 can reduce the frequency of migraine attacks by improving the energy production of cells as with riboflavin.  Doses of 150 mg twice a day have been shown to be effective.   Melatonin: Increasing evidence shows correlation between melatonin secretion and headache conditions.  Melatonin supplementation has decreased headache intensity and duration.  It is widely used as a sleep aid.  Sleep is natures way of dealing with migraine.  A dose of 3 mg is recommended to start for headaches including cluster headache. Higher doses up to 15 mg has been reviewed for use in Cluster headache and have been used. The rationale behind using melatonin for cluster is that many theories regarding the cause of Cluster headache center around the disruption of the normal circadian rhythm in the brain.  This helps restore the normal circadian rhythm.   HEADACHE DIET: Foods and beverages which may trigger migraine Note that only 20% of headache patients are food sensitive. You will know if you are food sensitive if you get a headache consistently 20 minutes to 2 hours after eating a certain food. Only cut out a food if it causes headaches, otherwise you might remove foods you enjoy! What matters most for diet is to eat a well balanced healthy diet full of vegetables and low fat protein, and to not miss meals.   Chocolate, other sweets ALL cheeses except cottage and cream cheese Dairy products, yogurt, sour cream, ice cream Liver Meat extracts (Bovril, Marmite, meat tenderizers) Meats or fish which have undergone aging, fermenting, pickling or smoking. These include: Hotdogs,salami,Lox,sausage, mortadellas,smoked  salmon, pepperoni, Pickled herring Pods of broad bean (English beans, Chinese pea pods, [place] (fava) beans, lima and navy beans Ripe avocado, ripe banana Yeast extracts or active yeast preparations such as Brewer's or Fleishman's (commercial bakes goods are permitted) Tomato based foods, pizza (lasagna, etc.)   MSG (monosodium glutamate) is disguised as many things; look for these common aliases: Monopotassium glutamate Autolysed yeast Hydrolysed protein Sodium caseinate "flavorings" "all natural preservatives" Nutrasweet   Avoid all other foods that convincingly provoke headaches.   Resources: The Dizzy [place] Laundry Your Headache Diet, migrainestrong.com  https://zamora-andrews.com/   Caffeine and Migraine For patients that have migraine, caffeine intake more than 3 days per week can lead to dependency and increased migraine frequency. I would recommend cutting back on your caffeine intake as best you can. The recommended amount of caffeine is 200-300 mg daily, although migraine patients may experience dependency at even lower doses. While you may notice an increase in headache temporarily, cutting back will be helpful for headaches in the long run. For more information on caffeine and migraine, visit: https://americanmigrainefoundation.org/resource-library/caffeine-and-migraine/   Headache Prevention Strategies:   1. Maintain a headache diary; learn to identify and avoid triggers.  - This can be a simple note where you log when you had a headache, associated symptoms, and medications used - There are several smartphone apps developed to  help track migraines: Migraine Buddy, Migraine Monitor, Curelator N1-Headache App   Common triggers include: Emotional triggers: Emotional/Upset family or friends Emotional/Upset occupation Business reversal/success Anticipation anxiety Crisis-serious Post-crisis periodNew job/position   Physical  triggers: Vacation Day Weekend Strenuous Exercise High Altitude Location New Move Menstrual Day Physical Illness Oversleep/Not enough sleep Weather changes Light: Photophobia or light sesnitivity treatment involves a balance between desensitization and reduction in overly strong input. Use dark polarized glasses outside, but not inside. Avoid bright or fluorescent light, but do not dim environment to the point that going into a normally lit room hurts. Consider FL-41 tint lenses, which reduce the most irritating wavelengths without blocking too much light.  These can be obtained at axonoptics.com or theraspecs.com Foods: see list above.   2. Limit use of acute treatments (over-the-counter medications, triptans, etc.) to no more than 2 days per week or 10 days per month to prevent medication overuse headache (rebound headache).     3. Follow a regular schedule (including weekends and holidays): Don't skip meals. Eat a balanced diet. 8 hours of sleep nightly. Minimize stress. Exercise 30 minutes per day. Being overweight is associated with a 5 times increased risk of chronic migraine. Keep well hydrated and drink 6-8 glasses of water per day.   4. Initiate non-pharmacologic measures at the earliest onset of your headache. Rest and quiet environment. Relax and reduce stress. Breathe2Relax is a free app that can instruct you on    some simple relaxtion and breathing techniques. Http://Dawnbuse.com is a    free website that provides teaching videos on relaxation.  Also, there are  many apps that   can be downloaded for "mindful" relaxation.  An app called YOGA NIDRA will help walk you through mindfulness. Another app called Calm can be downloaded to give you a structured mindfulness guide with daily reminders and skill development. Headspace for guided meditation Mindfulness Based Stress Reduction Online Course: www.palousemindfulness.com Cold compresses.   5. Don't wait!! Take the maximum  allowable dosage of prescribed medication at the first sign of migraine.   6. Compliance:  Take prescribed medication regularly as directed and at the first sign of a migraine.   7. Communicate:  Call your physician when problems arise, especially if your headaches change, increase in frequency/severity, or become associated with neurological symptoms (weakness, numbness, slurred speech, etc.). Proceed to emergency room if you experience new or worsening symptoms or symptoms do not resolve, if you have new neurologic symptoms or if headache is severe, or for any concerning symptom.   8. Headache/pain management therapies: Consider various complementary methods, including medication, behavioral therapy, psychological counselling, biofeedback, massage therapy, acupuncture, dry needling, and other modalities.  Such measures may reduce the need for medications. Counseling for pain management, where patients learn to function and ignore/minimize their pain, seems to work very well.   9. Recommend changing family's attention and focus away from patient's headaches. Instead, emphasize daily activities. If first question of day is 'How are your headaches/Do you have a headache today?', then patient will constantly think about headaches, thus making them worse. Goal is to re-direct attention away from headaches, toward daily activities and other distractions.   10. Helpful Websites: www.AmericanHeadacheSociety.org PatentHood.ch www.headaches.org TightMarket.nl www.achenet.org

## 2023-05-19 NOTE — Telephone Encounter (Signed)
Per Rema Jasmine, current Prior Authorization is expiring.  Submitted a Prior Authorization request to [facility] for [facility] via CoverMyMeds. Will update once we receive a response.   Key: [id number]

## 2023-05-19 NOTE — Progress Notes (Unsigned)
PATIENT: Monique Clark DOB: 1966-06-10  REASON FOR VISIT: follow up HISTORY FROM: patient  No chief complaint on file.    HISTORY OF PRESENT ILLNESS:  05/19/23 ALL:  [name] is a 57 y.o. female here today for follow up for OSA on CPAP.      HISTORY: (copied from Dr Dohmeier's previous note)  [name] is a 56 y.o. Caucasian female patient seen here as a referral on 05/19/2022 . She underwent an in lab SPLIT night study and this resulted in her being titrated to CPAP.  The study took place on 18 Feb 2022, the patient had endorsed the Epworth Sleepiness Scale at 12 out of 24 points before she slept 2 hours with an AHI of 37/h and reached split night protocol.  She did not sleep in supine.  Oxygen nadir was 86% saturation there were 21 minutes of low oxygen saturation was in 120 minutes of baseline study.  No significant PLM's.  There were some spontaneous arousals the patient was titrated with a small P10 nasal pillow and was able to achieve a reduction of her apnea-hypopnea index to 3.4/h while being on only 5 cmH2O.   She is presenting here today about 3 weeks post gastric sleeve surgery she has been 97% compliant by days and 86% compliant by hours with an average use of 6 hours 13 minutes at night the auto titration CPAP machine is set between 5 and 10 cm water with 3 cm EPR and her 95th percentile pressure is 8.7/h the residual AHI is 1.4/h which is a good resolution.  There is no significant air leakage noted.  There are no central apneas emerging either.  So overall this looks like the patient is able to reduce her apnea index very well she has also already lost about 20 pounds over the last 3 weeks which will have helped I see a trend on her curve of AHI that is clearly going downwards.  Her Epworth sleepiness score was still endorsed slightly elevated today at 11 out of 24 points.  She did not endorse the fatigue severity score.  The patient preferred to sleep prone  but she cannot do with a nasal pillow.  I reviewed all her sleep study data the patient was able to reach REM sleep right after CPAP was initiated and able to maintain REM sleep for almost 60 minutes.   She feels less fatigued, but sleepiness has been the same.  Nocturia 1-2 times at night as before.  No need to continue Ambien as a sleep aid- she had been on it before seeing me.  Prescriber is PCP, Dr Okey Dupre.   REVIEW OF SYSTEMS: Out of a complete 14 system review of symptoms, the patient complains only of the following symptoms, and all other reviewed systems are negative.  ESS:  ALLERGIES: Allergies  Allergen Reactions   Chlorhexidine Rash   Tetanus-Diphtheria Toxoids Td Swelling    angioedema   Nizatidine Hives    Axid   Promethazine Other (See Comments)    High doses causes confusion   Tetanus Toxoids Other (See Comments)    angioedema    HOME MEDICATIONS: Outpatient Medications Prior to Visit  Medication Sig Dispense Refill   albuterol (VENTOLIN HFA) 108 (90 Base) MCG/ACT inhaler Inhale 1 puff into the lungs every 6 (six) hours as needed for shortness of breath or wheezing.     calcium carbonate (TUMS) 500 MG chewable tablet Chew 1 tablet by mouth 3 (three) times daily.  Cholecalciferol (VITAMIN D3) 1.25 MG (50000 UT) CAPS Take 1 capsule (1.25 mg total) by mouth 2 (two) times a week as directed (Patient taking differently: Take 50,000 Units by mouth every 14 (fourteen) days. Mondays) 8 capsule 5   Diclofenac Sodium CR 100 MG 24 hr tablet Take 1 tablet (100 mg total) by mouth daily. 10 tablet 0   diphenhydrAMINE (BENADRYL) 25 MG tablet Take 25-50 mg by mouth See admin instructions. Take 25 mg by mouth as needed for allergies or itching and take 50 mg by mouth with Hizentra infusion     docusate sodium (COLACE) 100 MG capsule Take 200 mg by mouth at bedtime.     DULoxetine (CYMBALTA) 60 MG capsule Take 1 capsule (60 mg total) by mouth daily. 90 capsule 3   famotidine  (PEPCID) 20 MG tablet Take 20 mg by mouth daily as needed for heartburn or indigestion.     flecainide (TAMBOCOR) 100 MG tablet Take 1 tablet (100 mg total) by mouth every 12 (twelve) hours. (Patient taking differently: Take 100 mg by mouth at bedtime.) 180 tablet 1   gabapentin (NEURONTIN) 100 MG capsule Take 1 capsule by mouth every night, may increase to 1 capsule by mouth 3 times a day if needed. 90 capsule 0   HYDROcodone bit-homatropine (HYCODAN) 5-1.5 MG/5ML syrup Take 5 mLs by mouth every 8 (eight) hours as needed for cough. 120 mL 0   Immune Globulin, Human, (HIZENTRA) 4 GM/20ML SOLN 36 grams ( ) subcutaneously every 2 weeks via pump (Patient taking differently: Inject 36 g into the skin every 14 (fourteen) days.) 360 mL 11   lidocaine (LIDODERM) 5 % Place 1 patch onto the skin daily. Remove & Discard patch within 12 hours or as directed by MD 30 patch 0   methocarbamol (ROBAXIN) 500 MG tablet Take 1 tablet (500 mg total) by mouth every 6 (six) hours as needed for muscle spasms. 30 tablet 0   methocarbamol (ROBAXIN) 500 MG tablet Take 1 tablet (500 mg total) by mouth 2 (two) times daily. 20 tablet 0   methylPREDNISolone (MEDROL DOSEPAK) 4 MG TBPK tablet Take as prescribed on the box 21 tablet 0   Multiple Vitamin (MULTIVITAMIN WITH MINERALS) TABS tablet Take 1 tablet by mouth at bedtime.     nebivolol (BYSTOLIC) 5 MG tablet Take 1 tablet (5 mg total) by mouth daily. (Patient taking differently: Take 5 mg by mouth at bedtime.) 30 tablet 4   NON FORMULARY Pt uses a cpap nightly w/2 liters of oxygen     oxyCODONE-acetaminophen (PERCOCET) 5-325 MG tablet Take 1-2 tablets by mouth every 6 (six) hours as needed for severe pain. 40 tablet 0   rivaroxaban (XARELTO) 20 MG TABS tablet Take 1 tablet by mouth every day (Patient taking differently: Take 20 mg by mouth every evening.) 90 tablet 4   Sarilumab (KEVZARA) 200 MG/1. SOAJ Inject 200 mg into the skin every 14 (fourteen) days. 2.28 mL 2    zolpidem (AMBIEN) 5 MG tablet Take 1 tablet (5 mg total) by mouth at bedtime as needed for sleep. 30 tablet 5   No facility-administered medications prior to visit.    PAST MEDICAL HISTORY: Past Medical History:  Diagnosis Date   Abscess    Arthritis    Asthma    November 2023- hospitalized   Atrial fibrillation [facility])    Cardiac arrhythmia due to congenital heart disease    trace MR 08/15/20 echo ([place])- pt denies congenital heart disease   CVID (common variable  immunodeficiency) (HCC) 2015   takes Hizentra   Diverticulosis    Elevated cholesterol    GERD (gastroesophageal reflux disease)    Headache    High blood pressure    Non Hodgkin's lymphoma (HCC) 1998   Obesity    Personal history of gestational diabetes    PMR (polymyalgia rheumatica) (HCC)    takes Kevzara   Pneumonia    PONV (postoperative nausea and vomiting)    POTS (postural orthostatic tachycardia syndrome)    Pre-diabetes    Sleep apnea    CPAP with 2L O2   UTI (urinary tract infection)     PAST SURGICAL HISTORY: Past Surgical History:  Procedure Laterality Date   ABDOMINAL HYSTERECTOMY     APPENDECTOMY     CESAREAN SECTION     CHOLECYSTECTOMY     COLONOSCOPY     HEMATOMA EVACUATION N/A 03/04/2023   Procedure: EVACUATION HEMATOMA SEROMA WITH RECLOSURE LUMBAR INCISION;  Surgeon: [name], MD;  Location: [facility] OR;  Service: Orthopedics;  Laterality: N/A;   JOINT REPLACEMENT Left    hip   JOINT REPLACEMENT Right    Right Hip 2022   LAPAROSCOPIC GASTRIC SLEEVE RESECTION  04/29/2022   left shoulder repair     rotator cuff repair   LUMBAR LAMINECTOMY N/A 09/18/2021   Procedure: RIGHT LUMBAR FOUR-FIVE MICRODISCECTOMY;  Surgeon: [name], MD;  Location: [facility] OR;  Service: Orthopedics;  Laterality: N/A;   LUMBAR LAMINECTOMY/DECOMPRESSION MICRODISCECTOMY N/A 02/27/2023   Procedure: L4-5 LUMBAR DECOMPRESSION, MICRODISCECTOMY;  Surgeon: [name], MD;  Location: [facility] OR;  Service:  Orthopedics;  Laterality: N/A;   LUNG REMOVAL, PARTIAL Right 2009   VATS, wedge resection partial lobectomy, done at [facility]   myringostomy Bilateral    Myringotomy   SPLENECTOMY, TOTAL  1998   TONSILLECTOMY AND ADENOIDECTOMY     UPPER GI ENDOSCOPY N/A 04/29/2022   Procedure: UPPER GI ENDOSCOPY;  Surgeon: Kinsinger, De Blanch, MD;  Location: [facility] ORS;  Service: General;  Laterality: N/A;    FAMILY HISTORY: Family History  Problem Relation Age of Onset   Atrial fibrillation Mother    Colon cancer Father    Cancer Father        Colon   Colon cancer Sister    Basal cell carcinoma Brother    Atrial fibrillation Maternal Uncle    Atrial fibrillation Maternal Grandmother    Atrial fibrillation Maternal Grandfather    Heart attack Maternal Grandfather    Ovarian cancer Neg Hx    Stomach cancer Neg Hx    Rectal cancer Neg Hx     SOCIAL HISTORY: Social History   Socioeconomic History   Marital status: Married    Spouse name: Not on file   Number of children: 3   Years of education: Not on file   Highest education level: Doctorate  Occupational History   Occupation: rn  Tobacco Use   Smoking status: Former    Current packs/day: 0.00    Average packs/day: 0.5 packs/day for 20.0 years (10.0 ttl pk-yrs)    Types: Cigarettes    Start date: 09/29/1982    Quit date: 09/29/2002    Years since quitting: 20.6    Passive exposure: Current   Smokeless tobacco: Never  Vaping Use   Vaping status: Never Used  Substance and Sexual Activity   Alcohol use: Yes    Comment: once a month- rarely   Drug use: No   Sexual activity: Yes    Birth control/protection: Surgical,  Post-menopausal    Comment: 1st intercourse 80 yo-5 partners  Other Topics Concern   Not on file  Social History Narrative   Not on file   Social Determinants of Health   Financial Resource Strain: Low Risk  (03/13/2023)   Overall Financial Resource Strain (CARDIA)    Difficulty of Paying Living Expenses: Not  very hard  Food Insecurity: No Food Insecurity (03/13/2023)   Hunger Vital Sign    Worried About Running Out of Food in the Last Year: Never true    Ran Out of Food in the Last Year: Never true  Transportation Needs: No Transportation Needs (03/13/2023)   PRAPARE - Administrator, Civil Service (Medical): No    Lack of Transportation (Non-Medical): No  Physical Activity: Unknown (03/13/2023)   Exercise Vital Sign    Days of Exercise per Week: 0 days    Minutes of Exercise per Session: Not on file  Stress: Stress Concern Present (03/13/2023)   [employer] of Occupational Health - Occupational Stress Questionnaire    Feeling of Stress : To some extent  Social Connections: Moderately Isolated (03/13/2023)   Social Connection and Isolation Panel [NHANES]    Frequency of Communication with Friends and Family: Twice a week    Frequency of Social Gatherings with Friends and Family: Once a week    Attends Religious Services: Never    Database administrator or Organizations: No    Attends Engineer, structural: Not on file    Marital Status: Married  Catering manager Violence: Not At Risk (08/17/2022)   Humiliation, Afraid, Rape, and Kick questionnaire    Fear of Current or Ex-Partner: No    Emotionally Abused: No    Physically Abused: No    Sexually Abused: No     PHYSICAL EXAM  There were no vitals filed for this visit. There is no height or weight on file to calculate BMI.  Generalized: Well developed, in no acute distress  Cardiology: normal rate and rhythm, no murmur noted Respiratory: clear to auscultation bilaterally  Neurological examination  Mentation: Alert oriented to time, place, history taking. Follows all commands speech and language fluent Cranial nerve II-XII: Pupils were equal round reactive to light. Extraocular movements were full, visual field were full  Motor: The motor testing reveals 5 over 5 strength of all 4 extremities. Good symmetric  motor tone is noted throughout.  Gait and station: Gait is normal.    DIAGNOSTIC DATA (LABS, IMAGING, TESTING) - I reviewed patient records, labs, notes, testing and imaging myself where available.      No data to display           Lab Results  Component Value Date   WBC 15.7 (H) 05/16/2023   HGB 11.7 (L) 05/16/2023   HCT 37.0 05/16/2023   MCV 89.2 05/16/2023   PLT 369 05/16/2023      Component Value Date/Time   NA 139 05/16/2023 2258   K 3.9 05/16/2023 2258   CL 103 05/16/2023 2258   CO2 26 05/16/2023 2258   GLUCOSE 106 (H) 05/16/2023 2258   BUN 17 05/16/2023 2258   CREATININE 0.78 05/16/2023 2258   CREATININE 0.79 03/24/2023 1133   CALCIUM 9.2 05/16/2023 2258   PROT 6.3 03/24/2023 1133   ALBUMIN 3.6 08/16/2022 1632   AST 20 03/24/2023 1133   ALT 13 03/24/2023 1133   ALKPHOS 70 08/16/2022 1632   BILITOT 0.3 03/24/2023 1133   GFRNONAA >60 05/16/2023 2258  GFRAA >60 03/18/2020 1727   Lab Results  Component Value Date   CHOL 212 (H) 11/17/2022   HDL 65 11/17/2022   LDLCALC 131 (H) 11/17/2022   TRIG 64 11/17/2022   CHOLHDL 3.3 11/17/2022   Lab Results  Component Value Date   HGBA1C 5.5 04/16/2022   No results found for: "VITAMINB12" Lab Results  Component Value Date   TSH 1.430 03/13/2019     ASSESSMENT AND PLAN 57 y.o. year old female  has a past medical history of Abscess, Arthritis, Asthma, Atrial fibrillation (HCC), Cardiac arrhythmia due to congenital heart disease, CVID (common variable immunodeficiency) (HCC) (2015), Diverticulosis, Elevated cholesterol, GERD (gastroesophageal reflux disease), Headache, High blood pressure, Non Hodgkin's lymphoma (HCC) (1998), Obesity, Personal history of gestational diabetes, PMR (polymyalgia rheumatica) (HCC), Pneumonia, PONV (postoperative nausea and vomiting), POTS (postural orthostatic tachycardia syndrome), Pre-diabetes, Sleep apnea, and UTI (urinary tract infection). here with   No diagnosis found.     Beanca Bequette is doing well on CPAP therapy. Compliance report reveals ***. *** was encouraged to continue using CPAP nightly and for greater than 4 hours each night. We will update supply orders as indicated. Risks of untreated sleep apnea review and education materials provided. Healthy lifestyle habits encouraged. *** will follow up in ***, sooner if needed. *** verbalizes understanding and agreement with this plan.    No orders of the defined types were placed in this encounter.    No orders of the defined types were placed in this encounter.     Shawnie Dapper, FNP-C 05/19/2023, 4:44 PM [facility] [address] [place], [place] [address] [phone]

## 2023-05-20 ENCOUNTER — Other Ambulatory Visit: Payer: Self-pay

## 2023-05-20 ENCOUNTER — Encounter: Payer: Self-pay | Admitting: Family Medicine

## 2023-05-20 ENCOUNTER — Other Ambulatory Visit: Payer: Self-pay | Admitting: Physician Assistant

## 2023-05-20 ENCOUNTER — Other Ambulatory Visit (HOSPITAL_COMMUNITY): Payer: Self-pay

## 2023-05-20 ENCOUNTER — Ambulatory Visit: Payer: Commercial Managed Care - PPO | Admitting: Physician Assistant

## 2023-05-20 ENCOUNTER — Encounter: Payer: Self-pay | Admitting: Physician Assistant

## 2023-05-20 ENCOUNTER — Ambulatory Visit (INDEPENDENT_AMBULATORY_CARE_PROVIDER_SITE_OTHER): Payer: Commercial Managed Care - PPO | Admitting: Family Medicine

## 2023-05-20 VITALS — BP 122/79 | HR 65 | Ht 65.0 in | Wt 292.0 lb

## 2023-05-20 DIAGNOSIS — Z9889 Other specified postprocedural states: Secondary | ICD-10-CM

## 2023-05-20 DIAGNOSIS — G4733 Obstructive sleep apnea (adult) (pediatric): Secondary | ICD-10-CM | POA: Diagnosis not present

## 2023-05-20 MED ORDER — GABAPENTIN 100 MG PO CAPS
ORAL_CAPSULE | ORAL | 0 refills | Status: DC
  Filled 2023-05-20 (×2): qty 90, 30d supply, fill #0

## 2023-05-20 NOTE — Telephone Encounter (Signed)
Request canceled due to auth approval already being in place. Previously approved from 11/19/22 to 11/20/23. [employer] updated, nothing further needed at this time.

## 2023-05-20 NOTE — Progress Notes (Signed)
Office Visit Note   Patient: Monique Clark           Date of Birth: Apr 24, 1966           MRN: [id number] Visit Date: 05/20/2023              Requested by: Myrlene Broker, MD [address] [place],  [place] [address] PCP: Myrlene Broker, MD  Chief Complaint  Patient presents with   Lower Back - Pain      HPI: Patient is a pleasant 57 year old woman who is status post not quite 3 months L4-5 discectomy and decompression.  Also subsequent evacuation of the hematoma.  She is on chronic Xarelto.  She actually was doing well and then had a significant increase with movement about a week ago denies any fever chills or malaise.  She did present to the emergency room.  It was recommended after speaking with Dr. Christell Constant that she be started on a Medrol Dosepak.  She was neurologically intact and there was no evidence of any infection.  She presents in follow-up today and states she is doing better she still has some paresthesias in her thighs but the pain has significantly decreased rates her pain is mild to moderate.  Denies loss of bowel or bladder control  Assessment & Plan: Visit Diagnoses: Postop L4-5 decompression and evacuation of hematoma  Plan: She is doing better today which is reassuring.  She just finished a steroid Dosepak.  She also found that she has been helped by gabapentin but has not been able to get a refill.  I will give this refill to her today.  Told her had like her to follow-up with Dr. Ophelia Charter when he returns to make an appointment today if has any further exacerbation of symptoms.  Will also allow her to continue to work remotely for her job until she sees Dr. Ophelia Charter in about a month  Follow-Up Instructions: With Dr. Ophelia Charter may follow-up with myself or Dr. Oleh Genin Exam  Patient is alert, oriented, no adenopathy, well-dressed, normal affect, normal respiratory effort. Examination of her low back she has a well-healed surgical incision no redness no  erythema no fluctuance.  Minimally to nontender to palpation she is wearing a lidocaine patch which helps.  She is neurovascularly intact she has good extension flexion of her legs and ankles good strength.  Imaging: No results found. No images are attached to the encounter.  Labs: Lab Results  Component Value Date   HGBA1C 5.5 04/16/2022   HGBA1C 5.7 (H) 09/16/2021   HGBA1C 5.8 05/06/2021   ESRSEDRATE 39 (H) 03/24/2023   ESRSEDRATE 104 (H) 10/09/2022   ESRSEDRATE 119 (H) 05/20/2022   CRP 54.1 (H) 10/09/2022   CRP 51.4 (H) 05/20/2022   CRP 33.8 (H) 01/06/2022   REPTSTATUS 08/24/2022 FINAL 08/16/2022   CULT (A) 08/16/2022    CORYNEBACTERIUM, GROUP JK Standardized susceptibility testing for this organism is not available. THE SIGNIFICANCE OF ISOLATING THIS ORGANISM FROM A SINGLE SET OF BLOOD CULTURES WHEN MULTIPLE SETS ARE DRAWN IS UNCERTAIN. PLEASE NOTIFY THE MICROBIOLOGY DEPARTMENT WITHIN ONE WEEK IF SPECIATION AND SENSITIVITIES ARE REQUIRED. Performed at [facility], [address] [place], [place] [address]      Lab Results  Component Value Date   ALBUMIN 3.6 08/16/2022   ALBUMIN 3.4 (L) 04/16/2022   ALBUMIN 3.2 (L) 09/16/2021    Lab Results  Component Value Date   MG 1.8 03/18/2020   MG 1.8 03/13/2019  No results found for: "VD25OH"  No results found for: "PREALBUMIN"    Latest Ref Rng & Units 05/16/2023   10:58 PM 03/24/2023   11:33 AM 03/07/2023    1:20 AM  CBC EXTENDED  WBC 4.0 - 10.5 K/uL 15.7  6.4  13.1   RBC 3.87 - 5.11 MIL/uL 4.15  4.35  3.88   Hemoglobin 12.0 - 15.0 g/dL 02.7  25.3  66.4   HCT 36.0 - 46.0 % 37.0  38.6  34.6   Platelets 150 - 400 K/uL 369  414  309   NEUT# 1.7 - 7.7 K/uL 9.0  2,496    Lymph# 0.7 - 4.0 K/uL 4.7  3,072       There is no height or weight on file to calculate BMI.  Orders:  No orders of the defined types were placed in this encounter.  No orders of the defined types were placed in this encounter.     Procedures: No procedures performed  Clinical Data: No additional findings.  ROS:  All other systems negative, except as noted in the HPI. Review of Systems  Objective: Vital Signs: There were no vitals taken for this visit.  Specialty Comments:  MRI LUMBAR SPINE WITHOUT AND WITH CONTRAST   TECHNIQUE: Multiplanar and multiecho pulse sequences of the lumbar spine were obtained without and with intravenous contrast.   CONTRAST:  10 cc of UA intravenous   COMPARISON:  06/14/2021   FINDINGS: Segmentation:  Standard.   Alignment:  Physiologic.   Vertebrae: No fracture, evidence of discitis, or aggressive bone lesion.   Conus medullaris and cauda equina: Conus extends to the L1-2 level. Conus and cauda equina appear normal.   Paraspinal and other soft tissues: Atrophy of intrinsic back muscles, especially at the thoracolumbar junction and at the level of prior surgery where there is posterior scarring.   Disc levels:   L1-L2: Mild spondylosis   L2-L3: Disc narrowing and bulging with endplate and facet spurring. Small right foraminal protrusion. No neural impingement   L3-L4: Disc narrowing and bulging with endplate and facet spurring. Mild spinal and bilateral foraminal stenosis. Asymmetric left subarticular recess narrowing that is also mild   L4-L5: Disc narrowing and bulging with endplate ridging. New left paracentral herniation compressing the left L5 nerve root. Improved right-sided canal patency after right laminotomy, although there is isointense material at the right subarticular recess that is not clearly enhancing, some residual disc and associated stenosis. Overall moderate spinal stenosis. Facet spurring and disc bulging causes moderate foraminal narrowing on the right.   L5-S1:Disc narrowing and bulging with degenerative facet spurring. Bilateral subarticular recess stenosis that could affect the S1 nerve roots, stable.   IMPRESSION: 1. L4-5 left  paracentral herniation impinging on the left L5 nerve root, progressed from 2022. 2. Interval L4-5 right canal decompression with improved patency, although some residual disc and stenosis at the right subarticular recess. Moderate right foraminal narrowing at this level. 3. Stable noncompressive degeneration at the adjacent levels.     Electronically Signed   By: Tiburcio Pea M.D.   On: 07/14/2022 19:13  PMFS History: Patient Active Problem List   Diagnosis Date Noted   Encounter for general adult medical examination with abnormal findings 03/17/2023   Recurrent fracture of lumbar vertebra (HCC) 03/01/2023   History of lumbar laminectomy for spinal cord decompression 02/27/2023   Seronegative rheumatoid arthritis (HCC) 11/17/2022   Nocturnal hypoxemia 08/29/2022   Pulmonary nodule 08/16/2022   Recurrent herniation of lumbar disc 07/25/2022  OSA on CPAP 05/19/2022   Impingement syndrome of left shoulder 05/13/2022   Leukocytosis 01/20/2022   Morbid obesity (HCC) 01/01/2022   Chronic fatigue and immune dysfunction syndrome (HCC) 01/01/2022   High risk medication use 12/02/2021   PMR (polymyalgia rheumatica) (HCC) 11/05/2021   Status post lumbar spine operative procedure for decompression of spinal cord 09/27/2021   Insomnia 05/28/2021   COVID-19 05/07/2021   Malignant (primary) neoplasm, unspecified (HCC) 03/08/2021   Personal history of urinary (tract) infections 03/08/2021   Personal history of pneumonia (recurrent) 03/08/2021   Elevated serum hCG 07/02/2020   POTS (postural orthostatic tachycardia syndrome) 03/08/2020   Mixed hyperlipidemia 03/21/2019   Paroxysmal atrial fibrillation (HCC)    Localized swelling of left lower extremity    Essential hypertension    Mitral valve regurgitation 06/22/2017   CVID (common variable immunodeficiency) (HCC) 05/07/2015   Prediabetes 01/20/2013   History of non-Hodgkin's lymphoma 12/21/2012   Past Medical History:  Diagnosis  Date   Abscess    Arthritis    Asthma    November 2023- hospitalized   Atrial fibrillation (HCC)    Cardiac arrhythmia due to congenital heart disease    trace MR 08/15/20 echo ([place])- pt denies congenital heart disease   CVID (common variable immunodeficiency) (HCC) 2015   takes Hizentra   Diverticulosis    Elevated cholesterol    GERD (gastroesophageal reflux disease)    Headache    High blood pressure    Non Hodgkin's lymphoma (HCC) 1998   Obesity    Personal history of gestational diabetes    PMR (polymyalgia rheumatica) (HCC)    takes Kevzara   Pneumonia    PONV (postoperative nausea and vomiting)    POTS (postural orthostatic tachycardia syndrome)    Pre-diabetes    Sleep apnea    CPAP with 2L O2   UTI (urinary tract infection)     Family History  Problem Relation Age of Onset   Atrial fibrillation Mother    Colon cancer Father    Cancer Father        Colon   Colon cancer Sister    Basal cell carcinoma Brother    Atrial fibrillation Maternal Uncle    Atrial fibrillation Maternal Grandmother    Atrial fibrillation Maternal Grandfather    Heart attack Maternal Grandfather    Ovarian cancer Neg Hx    Stomach cancer Neg Hx    Rectal cancer Neg Hx     Past Surgical History:  Procedure Laterality Date   ABDOMINAL HYSTERECTOMY     APPENDECTOMY     CESAREAN SECTION     CHOLECYSTECTOMY     COLONOSCOPY     HEMATOMA EVACUATION N/A 03/04/2023   Procedure: EVACUATION HEMATOMA SEROMA WITH RECLOSURE LUMBAR INCISION;  Surgeon: [name], MD;  Location: [facility] OR;  Service: Orthopedics;  Laterality: N/A;   JOINT REPLACEMENT Left    hip   JOINT REPLACEMENT Right    Right Hip 2022   LAPAROSCOPIC GASTRIC SLEEVE RESECTION  04/29/2022   left shoulder repair     rotator cuff repair   LUMBAR LAMINECTOMY N/A 09/18/2021   Procedure: RIGHT LUMBAR FOUR-FIVE MICRODISCECTOMY;  Surgeon: [name], MD;  Location: [facility] OR;  Service: Orthopedics;  Laterality: N/A;    LUMBAR LAMINECTOMY/DECOMPRESSION MICRODISCECTOMY N/A 02/27/2023   Procedure: L4-5 LUMBAR DECOMPRESSION, MICRODISCECTOMY;  Surgeon: [name], MD;  Location: [facility];  Service: Orthopedics;  Laterality: N/A;   LUNG REMOVAL, PARTIAL Right 2009   VATS, wedge resection  partial lobectomy, done at [facility]   myringostomy Bilateral    Myringotomy   SPLENECTOMY, TOTAL  1998   TONSILLECTOMY AND ADENOIDECTOMY     UPPER GI ENDOSCOPY N/A 04/29/2022   Procedure: UPPER GI ENDOSCOPY;  Surgeon: Kinsinger, De Blanch, MD;  Location: [facility];  Service: General;  Laterality: N/A;   Social History   Occupational History   Occupation: rn  Tobacco Use   Smoking status: Former    Current packs/day: 0.00    Average packs/day: 0.5 packs/day for 20.0 years (10.0 ttl pk-yrs)    Types: Cigarettes    Start date: 09/29/1982    Quit date: 09/29/2002    Years since quitting: 20.6    Passive exposure: Current   Smokeless tobacco: Never  Vaping Use   Vaping status: Never Used  Substance and Sexual Activity   Alcohol use: Yes    Comment: once a month- rarely   Drug use: No   Sexual activity: Yes    Birth control/protection: Surgical, Post-menopausal    Comment: 1st intercourse 25 yo-5 partners

## 2023-05-22 ENCOUNTER — Other Ambulatory Visit (HOSPITAL_COMMUNITY): Payer: Self-pay

## 2023-05-25 DIAGNOSIS — D801 Nonfamilial hypogammaglobulinemia: Secondary | ICD-10-CM | POA: Diagnosis not present

## 2023-05-26 ENCOUNTER — Telehealth: Payer: Self-pay | Admitting: Orthopaedic Surgery

## 2023-05-26 NOTE — Telephone Encounter (Signed)
[place] forms received. To [employer]. 

## 2023-05-27 DIAGNOSIS — J189 Pneumonia, unspecified organism: Secondary | ICD-10-CM | POA: Diagnosis not present

## 2023-05-27 DIAGNOSIS — G4733 Obstructive sleep apnea (adult) (pediatric): Secondary | ICD-10-CM | POA: Diagnosis not present

## 2023-06-01 ENCOUNTER — Other Ambulatory Visit (HOSPITAL_COMMUNITY): Payer: Self-pay

## 2023-06-01 ENCOUNTER — Other Ambulatory Visit: Payer: Self-pay | Admitting: Internal Medicine

## 2023-06-01 DIAGNOSIS — Z79899 Other long term (current) drug therapy: Secondary | ICD-10-CM

## 2023-06-01 DIAGNOSIS — M353 Polymyalgia rheumatica: Secondary | ICD-10-CM

## 2023-06-02 ENCOUNTER — Other Ambulatory Visit: Payer: Self-pay

## 2023-06-02 ENCOUNTER — Other Ambulatory Visit (HOSPITAL_COMMUNITY): Payer: Self-pay

## 2023-06-02 MED ORDER — KEVZARA 200 MG/1.14ML ~~LOC~~ SOAJ
200.0000 mg | SUBCUTANEOUS | 0 refills | Status: AC
Start: 2023-06-02 — End: ?
  Filled 2023-06-02: qty 2.28, 28d supply, fill #0

## 2023-06-02 NOTE — Telephone Encounter (Signed)
Last Fill: 02/24/2023  Labs: 05/16/2023 Glucose 106 WBC 15.7 Hemoglobin 11.7 Neutro Abs 9.0 Lymphs Abs 4.7 Monocytes Absolute 1.8 Abs Immature Granulocytes 0.09  11/17/2022 Lipid Panel  Cholesterol 212 LDL Cholesterol 131 Non-HDL Cholesterol 147  TB Gold: not on file    Next Visit: 06/24/2023  Last Visit: 03/24/2023  DX:PMR (polymyalgia rheumatica)   Current Dose per office note 03/24/2023: Kevzara 200 mg subcu q. 14 days   Patient to update labs at upcoming appointment.   Okay to refill [name]?

## 2023-06-03 ENCOUNTER — Other Ambulatory Visit (HOSPITAL_COMMUNITY): Payer: Self-pay

## 2023-06-08 DIAGNOSIS — D801 Nonfamilial hypogammaglobulinemia: Secondary | ICD-10-CM | POA: Diagnosis not present

## 2023-06-09 ENCOUNTER — Other Ambulatory Visit: Payer: Self-pay

## 2023-06-09 ENCOUNTER — Ambulatory Visit (INDEPENDENT_AMBULATORY_CARE_PROVIDER_SITE_OTHER): Payer: Commercial Managed Care - PPO | Admitting: Orthopaedic Surgery

## 2023-06-09 VITALS — BP 134/83 | HR 80

## 2023-06-09 DIAGNOSIS — Z9889 Other specified postprocedural states: Secondary | ICD-10-CM

## 2023-06-09 MED ORDER — GABAPENTIN 300 MG PO CAPS
300.0000 mg | ORAL_CAPSULE | Freq: Two times a day (BID) | ORAL | 3 refills | Status: DC
  Filled 2023-06-09 – 2023-06-15 (×3): qty 60, 30d supply, fill #0
  Filled 2023-08-03: qty 60, 30d supply, fill #1
  Filled 2023-08-30: qty 60, 30d supply, fill #2
  Filled 2023-10-15: qty 60, 30d supply, fill #3

## 2023-06-09 NOTE — Progress Notes (Signed)
Office Visit Note   Patient: Monique Clark           Date of Birth: January 25, 1966           MRN: [id number] Visit Date: 06/09/2023              Requested by: Myrlene Broker, MD [address] [place],  [place] [address] PCP: Myrlene Broker, MD   Assessment & Plan: Visit Diagnoses:  1. Status post lumbar spine operative procedure for decompression of spinal cord     Plan: Will increase her gabapentin to 300 mg p.o. twice daily and recheck her in 3 months.  Work slip given continue remote work x 3 months.  We discussed pool exercises would likely would not bother her back if she can find someplace where she could go.  Follow-Up Instructions: Return in about 3 months (around 09/08/2023).   Orders:  No orders of the defined types were placed in this encounter.  Meds ordered this encounter  Medications   gabapentin (NEURONTIN) 300 MG capsule    Sig: Take 1 capsule (300 mg total) by mouth 2 (two) times daily.    Dispense:  60 capsule    Refill:  3      Procedures: No procedures performed   Clinical Data: No additional findings.   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  She is ambulating with a cane still working from home.  She is taking gabapentin 300 mg just at night plus some Advil or Tylenol and Robaxin.  Follow-up MRI scan listed below.  She had endplate edema changes at L4-5 with Modic changes.  Laminectomy noted.  She still has some disc bulging and some bilateral subarticular recess stenosis at L5-S1 which was not surgically addressed.  She has had difficulty with back pain trying to walk with her morbid obesity.  Does some comfort in the back and has some burning in the left foot.  Review of Systems 14 point systems updated unchanged.   Objective: Vital Signs: BP 134/83   Pulse 80    Physical Exam Constitutional:      Appearance: She is well-developed.  HENT:     Head: Normocephalic.     Right Ear: External ear normal.     Left Ear: External ear normal. There is no impacted cerumen.  Eyes:     Pupils: Pupils are equal, round, and reactive to light.  Neck:     Thyroid: No thyromegaly.     Trachea: No tracheal deviation.  Cardiovascular:     Rate and Rhythm: Normal rate.  Pulmonary:     Effort: Pulmonary effort is normal.  Abdominal:     Palpations: Abdomen is soft.  Musculoskeletal:     Cervical back: No rigidity.  Skin:    General: Skin is warm and dry.  Neurological:     Mental Status: She is alert and oriented to person, place, and time.  Psychiatric:        Behavior: Behavior normal.     Ortho Exam patient slow getting from sitting standing she is ambulating with a cane.  Anterior tib gastrocsoleus is active and strong.  Specialty Comments:  MRI LUMBAR SPINE WITHOUT AND WITH CONTRAST   TECHNIQUE: Multiplanar and multiecho pulse sequences of the lumbar spine were obtained without and with intravenous contrast.   CONTRAST:  10 cc  of UA intravenous   COMPARISON:  06/14/2021   FINDINGS: Segmentation:  Standard.   Alignment:  Physiologic.   Vertebrae: No fracture, evidence of discitis, or aggressive bone lesion.   Conus medullaris and cauda equina: Conus extends to the L1-2 level. Conus and cauda equina appear normal.   Paraspinal and other soft tissues: Atrophy of intrinsic back muscles, especially at the thoracolumbar junction and at the level of prior surgery where there is posterior scarring.   Disc levels:   L1-L2: Mild spondylosis   L2-L3: Disc narrowing and bulging with endplate and facet spurring. Small right foraminal protrusion. No neural impingement   L3-L4: Disc narrowing and bulging with endplate and facet spurring. Mild spinal and bilateral foraminal stenosis. Asymmetric left subarticular recess narrowing that is  also mild   L4-L5: Disc narrowing and bulging with endplate ridging. New left paracentral herniation compressing the left L5 nerve root. Improved right-sided canal patency after right laminotomy, although there is isointense material at the right subarticular recess that is not clearly enhancing, some residual disc and associated stenosis. Overall moderate spinal stenosis. Facet spurring and disc bulging causes moderate foraminal narrowing on the right.   L5-S1:Disc narrowing and bulging with degenerative facet spurring. Bilateral subarticular recess stenosis that could affect the S1 nerve roots, stable.   IMPRESSION: 1. L4-5 left paracentral herniation impinging on the left L5 nerve root, progressed from 2022. 2. Interval L4-5 right canal decompression with improved patency, although some residual disc and stenosis at the right subarticular recess. Moderate right foraminal narrowing at this level. 3. Stable noncompressive degeneration at the adjacent levels.     Electronically Signed   By: Tiburcio Pea M.D.   On: 07/14/2022 19:13  Imaging: No results found.   PMFS History: Patient Active Problem List   Diagnosis Date Noted   Encounter for general adult medical examination with abnormal findings 03/17/2023   Recurrent fracture of lumbar vertebra (HCC) 03/01/2023   History of lumbar laminectomy for spinal cord decompression 02/27/2023   Seronegative rheumatoid arthritis (HCC) 11/17/2022   Nocturnal hypoxemia 08/29/2022   Pulmonary nodule 08/16/2022   Recurrent herniation of lumbar disc 07/25/2022   OSA on CPAP 05/19/2022   Impingement syndrome of left shoulder 05/13/2022   Leukocytosis 01/20/2022   Morbid obesity (HCC) 01/01/2022   Chronic fatigue and immune dysfunction syndrome (HCC) 01/01/2022   High risk medication use 12/02/2021   PMR (polymyalgia rheumatica) (HCC) 11/05/2021   Status post lumbar spine operative procedure for decompression of spinal cord  09/27/2021   Insomnia 05/28/2021   COVID-19 05/07/2021   Malignant (primary) neoplasm, unspecified (HCC) 03/08/2021   Personal history of urinary (tract) infections 03/08/2021   Personal history of pneumonia (recurrent) 03/08/2021   Elevated serum hCG 07/02/2020   POTS (postural orthostatic tachycardia syndrome) 03/08/2020   Mixed hyperlipidemia 03/21/2019   Paroxysmal atrial fibrillation (HCC)    Localized swelling of left lower extremity    Essential hypertension    Mitral valve regurgitation 06/22/2017   CVID (common variable immunodeficiency) (HCC) 05/07/2015   Prediabetes 01/20/2013   History of non-Hodgkin's lymphoma 12/21/2012   Past Medical History:  Diagnosis Date   Abscess    Arthritis    Asthma    November 2023- hospitalized   Atrial fibrillation [facility])    Cardiac arrhythmia due to congenital heart disease    trace MR 08/15/20 echo ([place])- pt denies congenital heart disease   CVID (common variable immunodeficiency) (HCC) 2015   takes Hizentra   Diverticulosis    Elevated  cholesterol    GERD (gastroesophageal reflux disease)    Headache    High blood pressure    Non Hodgkin's lymphoma (HCC) 1998   Obesity    Personal history of gestational diabetes    PMR (polymyalgia rheumatica) (HCC)    takes Kevzara   Pneumonia    PONV (postoperative nausea and vomiting)    POTS (postural orthostatic tachycardia syndrome)    Pre-diabetes    Sleep apnea    CPAP with 2L O2   UTI (urinary tract infection)     Family History  Problem Relation Age of Onset   Atrial fibrillation Mother    Colon cancer Father    Cancer Father        Colon   Colon cancer Sister    Basal cell carcinoma Brother    Atrial fibrillation Maternal Uncle    Atrial fibrillation Maternal Grandmother    Atrial fibrillation Maternal Grandfather    Heart attack Maternal Grandfather    Ovarian cancer Neg Hx    Stomach cancer Neg Hx    Rectal cancer Neg Hx     Past Surgical History:   Procedure Laterality Date   ABDOMINAL HYSTERECTOMY     APPENDECTOMY     CESAREAN SECTION     CHOLECYSTECTOMY     COLONOSCOPY     HEMATOMA EVACUATION N/A 03/04/2023   Procedure: EVACUATION HEMATOMA SEROMA WITH RECLOSURE LUMBAR INCISION;  Surgeon: [name], MD;  Location: [facility];  Service: Orthopedics;  Laterality: N/A;   JOINT REPLACEMENT Left    hip   JOINT REPLACEMENT Right    Right Hip 2022   LAPAROSCOPIC GASTRIC SLEEVE RESECTION  04/29/2022   left shoulder repair     rotator cuff repair   LUMBAR LAMINECTOMY N/A 09/18/2021   Procedure: RIGHT LUMBAR FOUR-FIVE MICRODISCECTOMY;  Surgeon: [name], MD;  Location: [facility];  Service: Orthopedics;  Laterality: N/A;   LUMBAR LAMINECTOMY/DECOMPRESSION MICRODISCECTOMY N/A 02/27/2023   Procedure: L4-5 LUMBAR DECOMPRESSION, MICRODISCECTOMY;  Surgeon: [name], MD;  Location: [facility];  Service: Orthopedics;  Laterality: N/A;   LUNG REMOVAL, PARTIAL Right 2009   VATS, wedge resection partial lobectomy, done at [facility]   myringostomy Bilateral    Myringotomy   SPLENECTOMY, TOTAL  1998   TONSILLECTOMY AND ADENOIDECTOMY     UPPER GI ENDOSCOPY N/A 04/29/2022   Procedure: UPPER GI ENDOSCOPY;  Surgeon: Kinsinger, De Blanch, MD;  Location: [facility];  Service: General;  Laterality: N/A;   Social History   Occupational History   Occupation: rn  Tobacco Use   Smoking status: Former    Current packs/day: 0.00    Average packs/day: 0.5 packs/day for 20.0 years (10.0 ttl pk-yrs)    Types: Cigarettes    Start date: 09/29/1982    Quit date: 09/29/2002    Years since quitting: 20.7    Passive exposure: Current   Smokeless tobacco: Never  Vaping Use   Vaping status: Never Used  Substance and Sexual Activity   Alcohol use: Yes    Comment: once a month- rarely   Drug use: No   Sexual activity: Yes    Birth control/protection: Surgical, Post-menopausal    Comment: 1st intercourse 71 yo-5 partners

## 2023-06-10 NOTE — Progress Notes (Signed)
Office Visit Note  Patient: Monique Clark             Date of Birth: 07/25/1966           MRN: [id number]             PCP: Myrlene Broker, MD Referring: Myrlene Broker, * Visit Date: 06/24/2023   Subjective:  Follow-up (Patient states she feels her left arm is getting more swollen. )   History of Present Illness: Monique Clark is a 57 y.o. female here for follow up for PMR on KEVZARA 200mg  subcut every 14 days.  She feels symptoms are overall doing better on the medicine consistently but has some swelling and stiffness worse in the left arm. Particularly shoulder and elbow hurts worse with use but also achy at night. Low back pain is worse, had updated MRI on 8/18 with progression of degenerative changes. Following up with orthopedics for this, recommending possible fusion surgery but BMI is not appropriate.  Previous HPI 03/24/2023 Monique Clark is a 57 y.o. female here for follow up for PMR on KEVZARA 200mg  subcut every 14 days.  She started the [facility] in March and reports improvement in joint pain and stiffness.  She was able to completely discontinue prednisone without severe exacerbation of joint pain and weakness.  Her treatment was interrupted for several weeks due to going for her lumbar laminectomy at L4-L5 admitted for this May 31.  This was mildly complicated by requiring evacuation of associated hematoma seroma before discharge home.  Since then is proceeding well still has soreness in the area stitches still in place.  She since resumed [name] so far only 1 dose but notes seeing improvement in joint stiffness starting again within days.   Previous HPI 11/17/2022 Monique Clark is a 57 y.o. female here for follow up for PMR on prednisone 10 mg daily.  May recheck labs previously in January getting back onto this medication dose still showing very highly elevated serum inflammatory markers.  Symptoms are not too bad while on medication but she has not  tolerated tapering.  In the number of infectious issues related to URI but currently this has cleared up as well.     Previous HPI 09/18/22 Monique Clark is a 57 y.o. female here for follow up for PMR on prednisone 10 mg daily. Since the past visit she had a hospital admission for hypoxia suspected as viral URI trigger of asthma exacerbation. She stopped methotrexate at that time and has been holding off resuming the medication concern about contributing to infection.  Currently is partly through a course of Augmentin for the persistent upper respiratory infection.  She had lumbar spine injection for ongoing back pain without a great relief of symptoms this time.   Previous HPI 05/20/22 Monique Clark is a 57 y.o. female here for follow up for PMR on methotrexate 15 mg p.o. weekly.  Since her last visit she underwent gastric sleeve resection surgery for weight reduction.  This has still been in the recovery process.  She discontinued the prednisone perioperatively and has been off this.  She notices multiple symptoms are worse with joint pain and swelling especially in shoulders and elbows.  Not as much lower extremity swelling.   Previous HPI 01/06/2022 Monique Clark is a 57 y.o. female here for follow up for PMR on prednisone 10 mg daily and started methotrexate 10 mg PO weekly last month due to persistent inflammatory lab elevations.  She continues having fatigue is persistent.  Shortness of breath with walking and exertion is slightly improved.  She has not noticed any side effects or intolerance to methotrexate.  She is continuing to try to work on her exercise as a part of preparation for bariatric surgery also awaiting upcoming cardiac evaluation.     Previous HPI 12/02/21 Monique Clark is a 57 y.o. female here for muscle pain and weakness in shoulders and legs concern for history of PMR previously treated with long term prednisone.  She has a history of CVID on IVIG, non hodgkins  lymphoma, also has history of bilateral hip replacement. Recently had microdiscectomy about 6 weeks prior. Started prednisone 40 mg daily last month decreased to 20 mg daily and now is down to 10 mg dose. She felt her symptoms improve about 80-90% when starting prednisone again and tapering has been okay, only slight increase with this dose reduction. She does not see any particular swelling, discoloration, or rashes. She has noticed some hand tremor more when using her hands that is not typical for her.   Review of Systems  Constitutional:  Positive for fatigue.  HENT:  Negative for mouth sores and mouth dryness.   Eyes:  Positive for dryness.  Respiratory:  Negative for shortness of breath.   Cardiovascular:  Positive for palpitations. Negative for chest pain.  Gastrointestinal:  Negative for blood in stool, constipation and diarrhea.  Endocrine: Negative for increased urination.  Genitourinary:  Negative for involuntary urination.  Musculoskeletal:  Positive for joint pain, joint pain, joint swelling, myalgias, muscle weakness, morning stiffness, muscle tenderness and myalgias. Negative for gait problem.  Skin:  Negative for color change, rash, hair loss and sensitivity to sunlight.  Allergic/Immunologic: Positive for susceptible to infections.  Neurological:  Positive for headaches. Negative for dizziness.  Hematological:  Negative for swollen glands.  Psychiatric/Behavioral:  Positive for sleep disturbance. Negative for depressed mood. The patient is not nervous/anxious.     PMFS History:  Patient Active Problem List   Diagnosis Date Noted   Encounter for general adult medical examination with abnormal findings 03/17/2023   Recurrent fracture of lumbar vertebra (HCC) 03/01/2023   History of lumbar laminectomy for spinal cord decompression 02/27/2023   Seronegative rheumatoid arthritis (HCC) 11/17/2022   Nocturnal hypoxemia 08/29/2022   Pulmonary nodule 08/16/2022   Recurrent  herniation of lumbar disc 07/25/2022   OSA on CPAP 05/19/2022   Impingement syndrome of left shoulder 05/13/2022   Leukocytosis 01/20/2022   Morbid obesity (HCC) 01/01/2022   Chronic fatigue and immune dysfunction syndrome (HCC) 01/01/2022   High risk medication use 12/02/2021   PMR (polymyalgia rheumatica) (HCC) 11/05/2021   Status post lumbar spine operative procedure for decompression of spinal cord 09/27/2021   Insomnia 05/28/2021   COVID-19 05/07/2021   Malignant (primary) neoplasm, unspecified (HCC) 03/08/2021   Personal history of urinary (tract) infections 03/08/2021   Personal history of pneumonia (recurrent) 03/08/2021   Elevated serum hCG 07/02/2020   POTS (postural orthostatic tachycardia syndrome) 03/08/2020   Mixed hyperlipidemia 03/21/2019   Paroxysmal atrial fibrillation (HCC)    Localized swelling of left lower extremity    Essential hypertension    Mitral valve regurgitation 06/22/2017   CVID (common variable immunodeficiency) (HCC) 05/07/2015   Prediabetes 01/20/2013   History of non-Hodgkin's lymphoma 12/21/2012    Past Medical History:  Diagnosis Date   Abscess    Arthritis    Asthma    November 2023- hospitalized   Atrial fibrillation [facility])    Cardiac arrhythmia due to congenital heart  disease    trace MR 08/15/20 echo ([place])- pt denies congenital heart disease   CVID (common variable immunodeficiency) (HCC) 2015   takes Hizentra   Diverticulosis    Elevated cholesterol    GERD (gastroesophageal reflux disease)    Headache    High blood pressure    Non Hodgkin's lymphoma (HCC) 1998   Obesity    Personal history of gestational diabetes    PMR (polymyalgia rheumatica) (HCC)    takes Kevzara   Pneumonia    PONV (postoperative nausea and vomiting)    POTS (postural orthostatic tachycardia syndrome)    Pre-diabetes    Sleep apnea    CPAP with 2L O2   UTI (urinary tract infection)     Family History  Problem Relation Age of Onset    Atrial fibrillation Mother    Colon cancer Father    Cancer Father        Colon   Colon cancer Sister    Basal cell carcinoma Brother    Atrial fibrillation Maternal Uncle    Atrial fibrillation Maternal Grandmother    Atrial fibrillation Maternal Grandfather    Heart attack Maternal Grandfather    Ovarian cancer Neg Hx    Stomach cancer Neg Hx    Rectal cancer Neg Hx    Past Surgical History:  Procedure Laterality Date   ABDOMINAL HYSTERECTOMY     APPENDECTOMY     CESAREAN SECTION     CHOLECYSTECTOMY     COLONOSCOPY     HEMATOMA EVACUATION N/A 03/04/2023   Procedure: EVACUATION HEMATOMA SEROMA WITH RECLOSURE LUMBAR INCISION;  Surgeon: [name], MD;  Location: [facility] OR;  Service: Orthopedics;  Laterality: N/A;   JOINT REPLACEMENT Left    hip   JOINT REPLACEMENT Right    Right Hip 2022   LAPAROSCOPIC GASTRIC SLEEVE RESECTION  04/29/2022   left shoulder repair     rotator cuff repair   LUMBAR LAMINECTOMY N/A 09/18/2021   Procedure: RIGHT LUMBAR FOUR-FIVE MICRODISCECTOMY;  Surgeon: [name], MD;  Location: [facility] OR;  Service: Orthopedics;  Laterality: N/A;   LUMBAR LAMINECTOMY/DECOMPRESSION MICRODISCECTOMY N/A 02/27/2023   Procedure: L4-5 LUMBAR DECOMPRESSION, MICRODISCECTOMY;  Surgeon: [name], MD;  Location: [facility] OR;  Service: Orthopedics;  Laterality: N/A;   LUNG REMOVAL, PARTIAL Right 2009   VATS, wedge resection partial lobectomy, done at [facility]   myringostomy Bilateral    Myringotomy   SPLENECTOMY, TOTAL  1998   TONSILLECTOMY AND ADENOIDECTOMY     UPPER GI ENDOSCOPY N/A 04/29/2022   Procedure: UPPER GI ENDOSCOPY;  Surgeon: Rodman Pickle, MD;  Location: [facility] ORS;  Service: General;  Laterality: N/A;   Social History   Social History Narrative   Not on file   Immunization History  Administered Date(s) Administered   Influenza-Unspecified 07/27/2020, 07/08/2022   PFIZER(Purple Top)SARS-COV-2 Vaccination 09/24/2019, 10/12/2019      Objective: Vital Signs: BP (!) 164/91 (BP Location: Right Arm, Patient Position: Sitting, Cuff Size: Normal)   Pulse (!) 55   Resp 14   Ht 5\' 5"  (1.651 m)   Wt 297 lb (134.7 kg)   BMI 49.42 kg/m    Physical Exam Constitutional:      Appearance: She is obese.  Eyes:     Conjunctiva/sclera: Conjunctivae normal.  Cardiovascular:     Rate and Rhythm: Normal rate and regular rhythm.  Pulmonary:     Effort: Pulmonary effort is normal.     Breath sounds: Normal breath sounds.  Musculoskeletal:  Right lower leg: No edema.     Left lower leg: No edema.  Lymphadenopathy:     Cervical: No cervical adenopathy.  Skin:    General: Skin is warm and dry.     Findings: No rash.  Neurological:     Mental Status: She is alert.  Psychiatric:        Mood and Affect: Mood normal.      Musculoskeletal Exam:  Shoulders full ROM pain with overhead abduction and reaching behind back, no radiation down arm or into back Elbows decreased extension b/l, let elbow tenderness at medial and lateral epicondyle, no palpable effusion Wrists full ROM no tenderness or swelling Fingers full ROM no tenderness or swelling Low back midline tenderness to pressure, minimal paraspinal muscle tenderness and lateral hip tenderness Knees full ROM no tenderness or swelling Ankles full ROM no tenderness or swelling   Investigation: No additional findings.  Imaging: No results found.  Recent Labs: Lab Results  Component Value Date   WBC 15.7 (H) 05/16/2023   HGB 11.7 (L) 05/16/2023   PLT 369 05/16/2023   NA 139 05/16/2023   K 3.9 05/16/2023   CL 103 05/16/2023   CO2 26 05/16/2023   GLUCOSE 106 (H) 05/16/2023   BUN 17 05/16/2023   CREATININE 0.78 05/16/2023   BILITOT 0.3 03/24/2023   ALKPHOS 70 08/16/2022   AST 20 03/24/2023   ALT 13 03/24/2023   PROT 6.3 03/24/2023   ALBUMIN 3.6 08/16/2022   CALCIUM 9.2 05/16/2023   GFRAA >60 03/18/2020    Speciality Comments: No specialty comments  available.  Procedures:  No procedures performed Allergies: Chlorhexidine, Tetanus-diphtheria toxoids td, Nizatidine, Promethazine, and Tetanus toxoids   Assessment / Plan:     Visit Diagnoses: PMR (polymyalgia rheumatica) (HCC) Seronegative rheumatoid arthritis (HCC)  Joint inflammation overall appears to be doing fairly well her biggest issues increase in low back pain with progression of degenerative changes.  In a tough situation trying to lose weight but cannot tolerate weightbearing exercises.  Will try to hold off on resuming any low-dose glucocorticoids that would also be counterproductive to the long-term picture due to associated weight gain.  Plan to continue Kevzara 200 mg subcu q. 14 days.  High risk medication use - Kevzara 200 mg subcu q. 14 days  Recent labs reviewed collected from just last month blood count and metabolic panel look fine.  She anticipates updated tuberculosis screening which is required for occupational purposes next would be later this year.  No serious interval infections.    Orders: No orders of the defined types were placed in this encounter.  No orders of the defined types were placed in this encounter.    Follow-Up Instructions: Return in about 3 months (around 09/23/2023) for PMR/RA on KEV f/u 3mos.   Fuller Plan, MD  Note - This record has been created using [employer].  Chart creation errors have been sought, but may not always  have been located. Such creation errors do not reflect on  the standard of medical care.

## 2023-06-11 DIAGNOSIS — G4733 Obstructive sleep apnea (adult) (pediatric): Secondary | ICD-10-CM | POA: Diagnosis not present

## 2023-06-12 ENCOUNTER — Other Ambulatory Visit (HOSPITAL_COMMUNITY): Payer: Self-pay

## 2023-06-13 ENCOUNTER — Other Ambulatory Visit (HOSPITAL_COMMUNITY): Payer: Self-pay

## 2023-06-15 ENCOUNTER — Other Ambulatory Visit: Payer: Self-pay | Admitting: Orthopedic Surgery

## 2023-06-15 ENCOUNTER — Telehealth: Payer: Self-pay | Admitting: Orthopaedic Surgery

## 2023-06-15 ENCOUNTER — Other Ambulatory Visit (HOSPITAL_COMMUNITY): Payer: Self-pay

## 2023-06-15 ENCOUNTER — Other Ambulatory Visit: Payer: Self-pay

## 2023-06-15 MED ORDER — METHOCARBAMOL 500 MG PO TABS
500.0000 mg | ORAL_TABLET | Freq: Four times a day (QID) | ORAL | 0 refills | Status: DC | PRN
  Filled 2023-06-15: qty 30, 8d supply, fill #0

## 2023-06-15 NOTE — Telephone Encounter (Signed)
[place] forms received. To [employer].

## 2023-06-17 DIAGNOSIS — D801 Nonfamilial hypogammaglobulinemia: Secondary | ICD-10-CM | POA: Diagnosis not present

## 2023-06-18 ENCOUNTER — Other Ambulatory Visit (HOSPITAL_BASED_OUTPATIENT_CLINIC_OR_DEPARTMENT_OTHER): Payer: Self-pay

## 2023-06-22 ENCOUNTER — Encounter: Payer: Self-pay | Admitting: Orthopaedic Surgery

## 2023-06-22 DIAGNOSIS — M5416 Radiculopathy, lumbar region: Secondary | ICD-10-CM

## 2023-06-24 ENCOUNTER — Ambulatory Visit: Payer: Commercial Managed Care - PPO | Attending: Internal Medicine | Admitting: Internal Medicine

## 2023-06-24 ENCOUNTER — Encounter: Payer: Self-pay | Admitting: Internal Medicine

## 2023-06-24 VITALS — BP 164/91 | HR 55 | Resp 14 | Ht 65.0 in | Wt 297.0 lb

## 2023-06-24 DIAGNOSIS — M353 Polymyalgia rheumatica: Secondary | ICD-10-CM

## 2023-06-24 DIAGNOSIS — M0600 Rheumatoid arthritis without rheumatoid factor, unspecified site: Secondary | ICD-10-CM

## 2023-06-24 DIAGNOSIS — Z8572 Personal history of non-Hodgkin lymphomas: Secondary | ICD-10-CM | POA: Diagnosis not present

## 2023-06-24 DIAGNOSIS — Z79899 Other long term (current) drug therapy: Secondary | ICD-10-CM

## 2023-06-25 ENCOUNTER — Other Ambulatory Visit (HOSPITAL_COMMUNITY): Payer: Self-pay

## 2023-06-25 ENCOUNTER — Other Ambulatory Visit: Payer: Self-pay | Admitting: Orthopaedic Surgery

## 2023-06-25 MED ORDER — HYDROCODONE-ACETAMINOPHEN 5-325 MG PO TABS: 1.0000 | ORAL_TABLET | Freq: Four times a day (QID) | ORAL | 0 refills | Status: DC | PRN

## 2023-06-25 MED ORDER — HYDROCODONE-ACETAMINOPHEN 5-325 MG PO TABS
1.0000 | ORAL_TABLET | Freq: Four times a day (QID) | ORAL | 0 refills | Status: DC | PRN
  Filled 2023-06-25: qty 30, 8d supply, fill #0

## 2023-06-26 NOTE — Telephone Encounter (Signed)
Referral placed for Dr. Christell Constant, Elonda Husky will contact for apt.

## 2023-06-27 DIAGNOSIS — J189 Pneumonia, unspecified organism: Secondary | ICD-10-CM | POA: Diagnosis not present

## 2023-06-27 DIAGNOSIS — G4733 Obstructive sleep apnea (adult) (pediatric): Secondary | ICD-10-CM | POA: Diagnosis not present

## 2023-06-29 ENCOUNTER — Other Ambulatory Visit: Payer: Self-pay | Admitting: Orthopedic Surgery

## 2023-06-29 ENCOUNTER — Other Ambulatory Visit (HOSPITAL_COMMUNITY): Payer: Self-pay

## 2023-06-29 ENCOUNTER — Other Ambulatory Visit: Payer: Self-pay

## 2023-06-29 ENCOUNTER — Other Ambulatory Visit: Payer: Self-pay | Admitting: Internal Medicine

## 2023-06-29 DIAGNOSIS — M353 Polymyalgia rheumatica: Secondary | ICD-10-CM

## 2023-06-29 DIAGNOSIS — Z79899 Other long term (current) drug therapy: Secondary | ICD-10-CM

## 2023-06-29 NOTE — Telephone Encounter (Signed)
Last Fill: 06/02/2023  Labs: 05/16/2023 Glucose 106 WBC 15.7 Hemoglobin 11.7 Neutro Abs 9.0 Lymphs Abs 4.7 Monocytes Absolute 1.8 Abs Immature Granulocytes 0.09  TB Gold: 08/29/2022 Negative in [facility] tab. Per office note on 06/24/2023: She anticipates updated tuberculosis screening which is required for occupational purposes next would be later this year.   Next Visit: 09/28/2023  Last Visit: 06/24/2023  DX: PMR (polymyalgia rheumatica)   Current Dose per office note 06/24/2023: Kevzara 200 mg subcu q. 14 days.   Okay to refill Monique Clark?

## 2023-06-30 ENCOUNTER — Other Ambulatory Visit (HOSPITAL_COMMUNITY): Payer: Self-pay

## 2023-06-30 ENCOUNTER — Other Ambulatory Visit: Payer: Self-pay

## 2023-06-30 DIAGNOSIS — D801 Nonfamilial hypogammaglobulinemia: Secondary | ICD-10-CM | POA: Diagnosis not present

## 2023-06-30 MED ORDER — METHOCARBAMOL 500 MG PO TABS
500.0000 mg | ORAL_TABLET | Freq: Four times a day (QID) | ORAL | 0 refills | Status: DC | PRN
  Filled 2023-06-30: qty 30, 8d supply, fill #0

## 2023-06-30 MED ORDER — KEVZARA 200 MG/1.14ML ~~LOC~~ SOAJ
200.0000 mg | SUBCUTANEOUS | 0 refills | Status: DC
Start: 2023-06-30 — End: 2023-08-03
  Filled 2023-06-30 – 2023-07-01 (×2): qty 2.28, 28d supply, fill #0

## 2023-07-01 ENCOUNTER — Other Ambulatory Visit: Payer: Self-pay

## 2023-07-01 NOTE — Progress Notes (Signed)
Specialty Pharmacy Refill Coordination Note  Monique Clark is a 57 y.o. female contacted today regarding refills of specialty medication(s) Sarilumab   Patient requested Delivery   Delivery date: 07/10/23   Verified address: [address] [place] [address]   Medication will be filled on 07/09/23.

## 2023-07-02 ENCOUNTER — Other Ambulatory Visit (HOSPITAL_COMMUNITY): Payer: Self-pay

## 2023-07-02 ENCOUNTER — Other Ambulatory Visit: Payer: Self-pay

## 2023-07-02 MED ORDER — XARELTO 20 MG PO TABS
20.0000 mg | ORAL_TABLET | Freq: Every day | ORAL | 4 refills | Status: DC
  Filled 2023-07-02: qty 90, 90d supply, fill #0
  Filled 2023-09-21: qty 90, 90d supply, fill #1
  Filled 2023-12-26: qty 90, 90d supply, fill #2
  Filled 2024-03-27: qty 90, 90d supply, fill #3
  Filled 2024-06-27: qty 90, 90d supply, fill #4

## 2023-07-05 DIAGNOSIS — D801 Nonfamilial hypogammaglobulinemia: Secondary | ICD-10-CM | POA: Diagnosis not present

## 2023-07-08 ENCOUNTER — Other Ambulatory Visit (HOSPITAL_COMMUNITY): Payer: Self-pay

## 2023-07-08 ENCOUNTER — Other Ambulatory Visit: Payer: Self-pay | Admitting: Orthopaedic Surgery

## 2023-07-08 ENCOUNTER — Other Ambulatory Visit: Payer: Self-pay

## 2023-07-08 ENCOUNTER — Encounter: Payer: Self-pay | Admitting: Orthopaedic Surgery

## 2023-07-08 MED ORDER — HYDROCODONE-ACETAMINOPHEN 5-325 MG PO TABS
1.0000 | ORAL_TABLET | Freq: Four times a day (QID) | ORAL | 0 refills | Status: DC | PRN
  Filled 2023-07-08 (×2): qty 30, 8d supply, fill #0

## 2023-07-08 NOTE — Telephone Encounter (Signed)
Called and spoke with patient. Informed her medication was called in.

## 2023-07-09 ENCOUNTER — Other Ambulatory Visit: Payer: Self-pay | Admitting: Orthopaedic Surgery

## 2023-07-09 MED ORDER — HYDROCODONE-ACETAMINOPHEN 5-325 MG PO TABS: 1.0000 | ORAL_TABLET | Freq: Four times a day (QID) | ORAL | 0 refills | Status: DC | PRN

## 2023-07-10 ENCOUNTER — Other Ambulatory Visit (HOSPITAL_COMMUNITY): Payer: Self-pay

## 2023-07-11 DIAGNOSIS — G4733 Obstructive sleep apnea (adult) (pediatric): Secondary | ICD-10-CM | POA: Diagnosis not present

## 2023-07-19 DIAGNOSIS — D801 Nonfamilial hypogammaglobulinemia: Secondary | ICD-10-CM | POA: Diagnosis not present

## 2023-07-20 ENCOUNTER — Other Ambulatory Visit (HOSPITAL_COMMUNITY): Payer: Self-pay

## 2023-07-23 ENCOUNTER — Encounter: Payer: Self-pay | Admitting: Orthopaedic Surgery

## 2023-07-24 ENCOUNTER — Other Ambulatory Visit: Payer: Self-pay | Admitting: Orthopaedic Surgery

## 2023-07-24 MED ORDER — HYDROCODONE-ACETAMINOPHEN 5-325 MG PO TABS: 1.0000 | ORAL_TABLET | Freq: Four times a day (QID) | ORAL | 0 refills | Status: DC | PRN

## 2023-07-27 DIAGNOSIS — J189 Pneumonia, unspecified organism: Secondary | ICD-10-CM | POA: Diagnosis not present

## 2023-07-27 DIAGNOSIS — G4733 Obstructive sleep apnea (adult) (pediatric): Secondary | ICD-10-CM | POA: Diagnosis not present

## 2023-07-29 ENCOUNTER — Other Ambulatory Visit (INDEPENDENT_AMBULATORY_CARE_PROVIDER_SITE_OTHER): Payer: Commercial Managed Care - PPO

## 2023-07-29 ENCOUNTER — Ambulatory Visit (INDEPENDENT_AMBULATORY_CARE_PROVIDER_SITE_OTHER): Payer: Commercial Managed Care - PPO | Admitting: Orthopedic Surgery

## 2023-07-29 DIAGNOSIS — M5416 Radiculopathy, lumbar region: Secondary | ICD-10-CM

## 2023-07-29 NOTE — Progress Notes (Signed)
Orthopedic Spine Surgery Office Note   Assessment: Patient is a 57 y.o. female with history of right-sided L4/5 microdiscectomy, left-sided L4/5 microdiscectomy complicated by durotomy that required revision repair who has persistent low back pain and bilateral buttock and posterior thigh pain. She has stenosis at L4/5 and L5/S1 with vacuum disc phenomenon at L4/5. Less severe stenosis at L3/4.     Plan: -Patient has tried activity modification, muscle relaxers, gabapentin -She was sent to me by my partner Dr. Ophelia Charter for consideration of decompression and fusion surgery -I told her to address her symptoms, my surgical plan would involve an L4/5 ALIF for indirect decompression given her prior durotomy and direct decompression. This may help with her back pain due to the vacuum disc but I told her that back surgery does not predictable relieve back pain. With her new stenosis at L5/S1, I talked about direct decompression but on further thought and review of her MRI there does appear to be a CSF collection in the midline. I think doing an indirect decompression at L5/S1 would be a better option. Then, could do percutaneous screws in the back to avoid the midline CSF collection.  -I told her that her risks of surgery would be higher given her weight and that this would be a revision surgery. She is working on weight loss. I told her I could do her surgery if she would get down to a BMI of 40 -I also went over the fact that durotomy risk is higher given it is a revision surgery, she had a prior durotomy, she had a persistent leak after her most recent surgery, and there is a fluid collection in the midline that could be seroma but very well could be CSF -Patient is to follow up with Dr. Ophelia Charter in December. She can follow up with me at any time if she wants to go over my surgical plan again or wants to explore other treatment options     Patient expressed understanding of the plan and all questions were  answered to the patient's satisfaction.    ___________________________________________________________________________     History:   Patient is a 57 y.o. female who presents today for her lumbar spine. Patient has a history of right-sided L4/5 microdiscectomy with Dr. Ophelia Charter. She developed a recurrent disc herniation and underwent left-sided microdiscectomy. There was durotomy during this surgery. She had persistent headaches even after bedrest so she was taken back for revision durotomy repair. She had noticed improvement in her leg pain after this most recent surgery. More recently, she has developed bilateral buttock and bilateral posterior thigh pain. She has had chronic low back pain which was present before her most recent surgery and is still present.     Physical Exam:   General: no acute distress, appears stated age Neurologic: alert, answering questions appropriately, following commands Respiratory: unlabored breathing on room air, symmetric chest rise Psychiatric: appropriate affect, normal cadence to speech     MSK (spine):   -Strength exam                                                   Left                  Right EHL  5/5                  5/5 TA                                 5/5                  5/5 GSC                             5/5                  5/5 Knee extension            5/5                  5/5 Hip flexion                    5/5                  5/5   -Sensory exam                           Sensation intact to light touch in L3-S1 nerve distributions of bilateral lower extremities   -Straight leg raise: negative -Contralateral straight leg raise: negative -Clonus: no beats bilaterally   Imaging: XRs of the lumbar spine from 07/29/2023 were independently reviewed and interpreted, showing disc height loss at L4/5. No fracture or dislocation. No evidence of instability on flexion/extension views. PI of 52, LL of 37.   MRI  of the lumbar spine from 05/17/2023 was independently reviewed and interpreted, showing central stenosis and bilateral foraminal stenosis at L4/5. Less significant central stenosis at L3/4. Bilateral lateral recess stenosis at L5/S1. Laminectomy defect at L4/5.    Patient name: Monique Clark Patient MRN: [id number] Date of visit: 07/29/23

## 2023-08-02 DIAGNOSIS — D801 Nonfamilial hypogammaglobulinemia: Secondary | ICD-10-CM | POA: Diagnosis not present

## 2023-08-03 ENCOUNTER — Other Ambulatory Visit: Payer: Self-pay | Admitting: Orthopedic Surgery

## 2023-08-03 ENCOUNTER — Other Ambulatory Visit (HOSPITAL_COMMUNITY): Payer: Self-pay

## 2023-08-03 ENCOUNTER — Other Ambulatory Visit: Payer: Self-pay

## 2023-08-03 ENCOUNTER — Other Ambulatory Visit: Payer: Self-pay | Admitting: Internal Medicine

## 2023-08-03 DIAGNOSIS — M353 Polymyalgia rheumatica: Secondary | ICD-10-CM

## 2023-08-03 DIAGNOSIS — Z79899 Other long term (current) drug therapy: Secondary | ICD-10-CM

## 2023-08-03 MED ORDER — KEVZARA 200 MG/1.14ML ~~LOC~~ SOAJ
200.0000 mg | SUBCUTANEOUS | 2 refills | Status: DC
Start: 2023-08-03 — End: 2023-12-07
  Filled 2023-08-03 – 2023-08-07 (×2): qty 2.28, 28d supply, fill #0
  Filled 2023-09-08 – 2023-09-11 (×2): qty 2.28, 28d supply, fill #1
  Filled 2023-10-12: qty 2.28, 28d supply, fill #2

## 2023-08-03 MED ORDER — METHOCARBAMOL 500 MG PO TABS
500.0000 mg | ORAL_TABLET | Freq: Four times a day (QID) | ORAL | 0 refills | Status: DC | PRN
  Filled 2023-08-03: qty 30, 8d supply, fill #0

## 2023-08-03 NOTE — Telephone Encounter (Signed)
Last Fill: 06/30/2023  Labs: 05/16/2023  Glucose 106 WBC 15.7 Hemoglobin 11.7 Neutro Abs 9.0 Lymphs Abs 4.7 Monocytes Absolute 1.8 Abs Immature Granulocytes 0.09  Lipid Panel 11/17/2022 Cholesterol 212 LDL 131 Non-HDL Cholesterol 147  TB Gold: 08/29/2022 Negative ([facility] Tab)   Next Visit: 09/28/2023  Last Visit: 06/24/2023  DX: PMR (polymyalgia rheumatica)   Current Dose per office note 06/24/2023: Kevzara 200 mg subcu q. 14 days   Contacted the patient to advise lipid panel is due and CBC and CMP will be due this month patient states she will try to come into the office to get labs done. Lipid panel lab is already in orders, please review and sign CBC and CMP.   Okay to refill [name]?

## 2023-08-04 ENCOUNTER — Other Ambulatory Visit (HOSPITAL_COMMUNITY): Payer: Self-pay

## 2023-08-04 ENCOUNTER — Other Ambulatory Visit: Payer: Self-pay

## 2023-08-04 MED ORDER — ALBUTEROL SULFATE HFA 108 (90 BASE) MCG/ACT IN AERS
1.0000 | INHALATION_SPRAY | Freq: Four times a day (QID) | RESPIRATORY_TRACT | 3 refills | Status: DC | PRN
  Filled 2023-08-04: qty 6.7, 30d supply, fill #0

## 2023-08-07 ENCOUNTER — Other Ambulatory Visit (HOSPITAL_COMMUNITY): Payer: Self-pay | Admitting: Pharmacy Technician

## 2023-08-07 ENCOUNTER — Other Ambulatory Visit (HOSPITAL_COMMUNITY): Payer: Self-pay

## 2023-08-07 ENCOUNTER — Other Ambulatory Visit: Payer: Self-pay

## 2023-08-07 NOTE — Progress Notes (Signed)
Specialty Pharmacy Refill Coordination Note  Monique Clark is a 57 y.o. female contacted today regarding refills of specialty medication(s) Sarilumab   Patient requested No data recorded  Delivery date: 08/18/23   Verified address: [address]  [place]   Medication will be filled on 08/17/23.

## 2023-08-11 DIAGNOSIS — G4733 Obstructive sleep apnea (adult) (pediatric): Secondary | ICD-10-CM | POA: Diagnosis not present

## 2023-08-13 ENCOUNTER — Other Ambulatory Visit: Payer: Self-pay | Admitting: Orthopaedic Surgery

## 2023-08-13 MED ORDER — HYDROCODONE-ACETAMINOPHEN 5-325 MG PO TABS: 1.0000 | ORAL_TABLET | Freq: Four times a day (QID) | ORAL | 0 refills | Status: DC | PRN

## 2023-08-13 NOTE — Telephone Encounter (Signed)
noted 

## 2023-08-20 NOTE — Addendum Note (Signed)
Addended by: Willia Craze on: 08/20/2023 09:25 AM   Modules accepted: Orders

## 2023-08-21 ENCOUNTER — Encounter: Payer: Self-pay | Admitting: Orthopaedic Surgery

## 2023-08-24 ENCOUNTER — Other Ambulatory Visit: Payer: Self-pay | Admitting: Orthopaedic Surgery

## 2023-08-25 ENCOUNTER — Encounter: Payer: Self-pay | Admitting: Internal Medicine

## 2023-08-25 MED ORDER — ZOLPIDEM TARTRATE 5 MG PO TABS: 5.0000 mg | ORAL_TABLET | Freq: Every evening | ORAL | 5 refills | Status: DC | PRN

## 2023-08-25 NOTE — Telephone Encounter (Signed)
Pharmacy is correct on file

## 2023-08-25 NOTE — Telephone Encounter (Signed)
noted 

## 2023-08-27 DIAGNOSIS — J189 Pneumonia, unspecified organism: Secondary | ICD-10-CM | POA: Diagnosis not present

## 2023-08-27 DIAGNOSIS — G4733 Obstructive sleep apnea (adult) (pediatric): Secondary | ICD-10-CM | POA: Diagnosis not present

## 2023-08-30 ENCOUNTER — Other Ambulatory Visit (HOSPITAL_COMMUNITY): Payer: Self-pay

## 2023-08-30 DIAGNOSIS — D801 Nonfamilial hypogammaglobulinemia: Secondary | ICD-10-CM | POA: Diagnosis not present

## 2023-08-31 DIAGNOSIS — I10 Essential (primary) hypertension: Secondary | ICD-10-CM | POA: Diagnosis not present

## 2023-08-31 DIAGNOSIS — I4891 Unspecified atrial fibrillation: Secondary | ICD-10-CM | POA: Diagnosis not present

## 2023-08-31 DIAGNOSIS — Z6841 Body Mass Index (BMI) 40.0 and over, adult: Secondary | ICD-10-CM | POA: Diagnosis not present

## 2023-08-31 DIAGNOSIS — I340 Nonrheumatic mitral (valve) insufficiency: Secondary | ICD-10-CM | POA: Diagnosis not present

## 2023-09-04 ENCOUNTER — Other Ambulatory Visit: Payer: Self-pay

## 2023-09-07 ENCOUNTER — Encounter: Payer: Self-pay | Admitting: Orthopaedic Surgery

## 2023-09-08 ENCOUNTER — Other Ambulatory Visit: Payer: Self-pay | Admitting: Orthopedic Surgery

## 2023-09-08 ENCOUNTER — Ambulatory Visit: Payer: Commercial Managed Care - PPO | Admitting: Orthopaedic Surgery

## 2023-09-09 ENCOUNTER — Other Ambulatory Visit: Payer: Self-pay

## 2023-09-09 MED ORDER — METHOCARBAMOL 500 MG PO TABS
500.0000 mg | ORAL_TABLET | Freq: Four times a day (QID) | ORAL | 0 refills | Status: DC | PRN
  Filled 2023-09-09: qty 30, 8d supply, fill #0

## 2023-09-11 ENCOUNTER — Other Ambulatory Visit (HOSPITAL_COMMUNITY): Payer: Self-pay

## 2023-09-11 ENCOUNTER — Other Ambulatory Visit (HOSPITAL_COMMUNITY): Payer: Self-pay | Admitting: Pharmacy Technician

## 2023-09-11 NOTE — Progress Notes (Signed)
Specialty Pharmacy Refill Coordination Note  Monique Clark is a 57 y.o. female contacted today regarding refills of specialty medication(s) No data recorded  Patient requested (Patient-Rptd) Delivery   Delivery date: (Patient-Rptd) 09/21/23 *Delivery date 09/17/22 Due to the 23rd being a Monday & this is a fridge item*   Verified address: (Patient-Rptd) [address] [place], [place] [address]   Medication will be filled on 09/17/23

## 2023-09-13 DIAGNOSIS — D801 Nonfamilial hypogammaglobulinemia: Secondary | ICD-10-CM | POA: Diagnosis not present

## 2023-09-14 ENCOUNTER — Other Ambulatory Visit: Payer: Self-pay

## 2023-09-14 ENCOUNTER — Ambulatory Visit: Payer: Commercial Managed Care - PPO | Admitting: Physical Medicine and Rehabilitation

## 2023-09-14 DIAGNOSIS — Z79899 Other long term (current) drug therapy: Secondary | ICD-10-CM

## 2023-09-14 DIAGNOSIS — M353 Polymyalgia rheumatica: Secondary | ICD-10-CM

## 2023-09-14 DIAGNOSIS — M5416 Radiculopathy, lumbar region: Secondary | ICD-10-CM | POA: Diagnosis not present

## 2023-09-14 DIAGNOSIS — Z111 Encounter for screening for respiratory tuberculosis: Secondary | ICD-10-CM

## 2023-09-14 MED ORDER — METHYLPREDNISOLONE ACETATE 40 MG/ML IJ SUSP
40.0000 mg | Freq: Once | INTRAMUSCULAR | Status: AC
Start: 2023-09-14 — End: 2023-09-14
  Administered 2023-09-14: 40 mg

## 2023-09-14 NOTE — Patient Instructions (Signed)

## 2023-09-14 NOTE — Progress Notes (Signed)
Monique Clark - 57 y.o. female MRN [id number]  Date of birth: August 29, 1966  Office Visit Note: Visit Date: 09/14/2023 PCP: Myrlene Broker, MD Referred by: London Sheer, MD  Subjective: Chief Complaint  Patient presents with   Lower Back - Pain   HPI:  Monique Clark is a 57 y.o. female who comes in today at the request of Dr. Willia Craze for planned Bilateral L5-S1 Lumbar Transforaminal epidural steroid injection with fluoroscopic guidance.  The patient has failed conservative care including home exercise, medications, time and activity modification.  This injection will be diagnostic and hopefully therapeutic.  Please see requesting physician notes for further details and justification.   ROS Otherwise per HPI.  Assessment & Plan: Visit Diagnoses:    ICD-10-CM   1. Lumbar radiculopathy  M54.16 XR C-ARM NO REPORT    Epidural Steroid injection    methylPREDNISolone acetate (DEPO-MEDROL) injection 40 mg      Plan: No additional findings.   Meds & Orders:  Meds ordered this encounter  Medications   methylPREDNISolone acetate (DEPO-MEDROL) injection 40 mg    Orders Placed This Encounter  Procedures   XR C-ARM NO REPORT   Epidural Steroid injection    Follow-up: Return for visit to requesting provider as needed.   Procedures: No procedures performed  Lumbosacral Transforaminal Epidural Steroid Injection - Sub-Pedicular Approach with Fluoroscopic Guidance  Patient: Monique Clark      Date of Birth: [birth year] MRN: [id number] PCP: Myrlene Broker, MD      Visit Date: 09/14/2023   Universal Protocol:    Date/Time: 09/14/2023  Consent Given By: the patient  Position: PRONE  Additional Comments: Vital signs were monitored before and after the procedure. Patient was prepped and draped in the usual sterile fashion. The correct patient, procedure, and site was verified.   Injection Procedure Details:   Procedure diagnoses: Lumbar  radiculopathy [M54.16]    Meds Administered:  Meds ordered this encounter  Medications   methylPREDNISolone acetate (DEPO-MEDROL) injection 40 mg    Laterality: Bilateral  Location/Site: L5  Needle:5.0 in., 22 ga.  Short bevel or Quincke spinal needle  Needle Placement: Transforaminal  Findings:    -Comments: Excellent flow of contrast along the nerve, nerve root and into the epidural space.  Procedure Details: After squaring off the end-plates to get a true AP view, the C-arm was positioned so that an oblique view of the foramen as noted above was visualized. The target area is just inferior to the "nose of the scotty dog" or sub pedicular. The soft tissues overlying this structure were infiltrated with 2-3 ml. of 1% Lidocaine without Epinephrine.  The spinal needle was inserted toward the target using a "trajectory" view along the fluoroscope beam.  Under AP and lateral visualization, the needle was advanced so it did not puncture dura and was located close the 6 O'Clock position of the pedical in AP tracterory. Biplanar projections were used to confirm position. Aspiration was confirmed to be negative for CSF and/or blood. A 1-2 ml. volume of Isovue-250 was injected and flow of contrast was noted at each level. Radiographs were obtained for documentation purposes.   After attaining the desired flow of contrast documented above, a 0.5 to 1.0 ml test dose of 0.25% Marcaine was injected into each respective transforaminal space.  The patient was observed for 90 seconds post injection.  After no sensory deficits were reported, and normal lower extremity motor function was noted,   the above injectate was administered so  that equal amounts of the injectate were placed at each foramen (level) into the transforaminal epidural space.   Additional Comments:  The patient tolerated the procedure well Dressing: 2 x 2 sterile gauze and Band-Aid    Post-procedure details: Patient was observed  during the procedure. Post-procedure instructions were reviewed.  Patient left the clinic in stable condition.    Clinical History: CLINICAL DATA:  Trauma and low back pain   EXAM: MRI LUMBAR SPINE WITHOUT AND WITH CONTRAST   TECHNIQUE: Multiplanar and multiecho pulse sequences of the lumbar spine were obtained without and with intravenous contrast.   CONTRAST:  10mL GADAVIST GADOBUTROL 1 MMOL/ML IV SOLN   COMPARISON:  CT 12/30/2022   FINDINGS: Segmentation:  Standard.   Alignment:  Physiologic.   Vertebrae: No fracture, evidence of discitis, or bone lesion. Discogenic endplate edema at U9-8   Conus medullaris and cauda equina: Conus extends to the L1-2 level. Conus and cauda equina appear normal.   Paraspinal and other soft tissues: Chronic incisional seroma at L4-5; no worrisome features. Generalized atrophy of intrinsic back muscles   Disc levels:   T12- L1: Unremarkable.   L1-L2: Mild spondylosis.   L2-L3: Disc bulging with mild endplate and facet spurring no impingement   L3-L4: Disc narrowing and circumferential bulging. Facet spurring and ligamentum flavum thickening superimposed on congenitally narrow canal from short pedicles. Moderate and progressed spinal stenosis. The foramina remain patent   L4-L5: Disc collapse and endplate degeneration with circumferential bulging. Degenerative facet spurring on both sides with ligamentum flavum thickening. Congenitally narrow spinal canal. Despite prior decompression there is advanced thecal sac stenosis. Moderate left foraminal narrowing   L5-S1:Bulging disc with left foraminal protrusion impinging on the left S1 nerve root. Facet and ligamentous overgrowth causes moderate right subarticular recess narrowing. Degenerative facet spurring on both sides.   IMPRESSION: 1. No acute finding. 2. Lumbar spine degeneration since 07/12/2022 with thecal sac stenosis that is advanced at L4-5 and moderate at  L3-4. 3. Bilateral subarticular recess stenosis at L5-S1.     Electronically Signed   By: Tiburcio Pea M.D.   On: 05/17/2023 06:55     Objective:  VS:  HT:    WT:   BMI:     BP:   HR: bpm  TEMP: ( )  RESP:  Physical Exam Vitals and nursing note reviewed.  Constitutional:      General: She is not in acute distress.    Appearance: Normal appearance. She is obese. She is not ill-appearing.  HENT:     Head: Normocephalic and atraumatic.     Right Ear: External ear normal.     Left Ear: External ear normal.  Eyes:     Extraocular Movements: Extraocular movements intact.  Cardiovascular:     Rate and Rhythm: Normal rate.     Pulses: Normal pulses.  Pulmonary:     Effort: Pulmonary effort is normal. No respiratory distress.  Abdominal:     General: There is no distension.     Palpations: Abdomen is soft.  Musculoskeletal:        General: Tenderness present.     Cervical back: Neck supple.     Right lower leg: No edema.     Left lower leg: No edema.     Comments: Patient has good distal strength with no pain over the greater trochanters.  No clonus or focal weakness.  Skin:    Findings: No erythema, lesion or rash.  Neurological:     General:  No focal deficit present.     Mental Status: She is alert and oriented to person, place, and time.     Sensory: No sensory deficit.     Motor: No weakness or abnormal muscle tone.     Coordination: Coordination normal.  Psychiatric:        Mood and Affect: Mood normal.        Behavior: Behavior normal.      Imaging: XR C-ARM NO REPORT Result Date: 09/14/2023 Please see Notes tab for imaging impression.

## 2023-09-15 NOTE — Procedures (Signed)
Lumbosacral Transforaminal Epidural Steroid Injection - Sub-Pedicular Approach with Fluoroscopic Guidance  Patient: Monique Clark      Date of Birth: [birth year] MRN: [id number] PCP: Myrlene Broker, MD      Visit Date: 09/14/2023   Universal Protocol:    Date/Time: 09/14/2023  Consent Given By: the patient  Position: PRONE  Additional Comments: Vital signs were monitored before and after the procedure. Patient was prepped and draped in the usual sterile fashion. The correct patient, procedure, and site was verified.   Injection Procedure Details:   Procedure diagnoses: Lumbar radiculopathy [M54.16]    Meds Administered:  Meds ordered this encounter  Medications   methylPREDNISolone acetate (DEPO-MEDROL) injection 40 mg    Laterality: Bilateral  Location/Site: L5  Needle:5.0 in., 22 ga.  Short bevel or Quincke spinal needle  Needle Placement: Transforaminal  Findings:    -Comments: Excellent flow of contrast along the nerve, nerve root and into the epidural space.  Procedure Details: After squaring off the end-plates to get a true AP view, the C-arm was positioned so that an oblique view of the foramen as noted above was visualized. The target area is just inferior to the "nose of the scotty dog" or sub pedicular. The soft tissues overlying this structure were infiltrated with 2-3 ml. of 1% Lidocaine without Epinephrine.  The spinal needle was inserted toward the target using a "trajectory" view along the fluoroscope beam.  Under AP and lateral visualization, the needle was advanced so it did not puncture dura and was located close the 6 O'Clock position of the pedical in AP tracterory. Biplanar projections were used to confirm position. Aspiration was confirmed to be negative for CSF and/or blood. A 1-2 ml. volume of Isovue-250 was injected and flow of contrast was noted at each level. Radiographs were obtained for documentation purposes.   After attaining the  desired flow of contrast documented above, a 0.5 to 1.0 ml test dose of 0.25% Marcaine was injected into each respective transforaminal space.  The patient was observed for 90 seconds post injection.  After no sensory deficits were reported, and normal lower extremity motor function was noted,   the above injectate was administered so that equal amounts of the injectate were placed at each foramen (level) into the transforaminal epidural space.   Additional Comments:  The patient tolerated the procedure well Dressing: 2 x 2 sterile gauze and Band-Aid    Post-procedure details: Patient was observed during the procedure. Post-procedure instructions were reviewed.  Patient left the clinic in stable condition.

## 2023-09-16 LAB — CBC WITH DIFFERENTIAL/PLATELET
Absolute Lymphocytes: 4418 {cells}/uL — ABNORMAL HIGH (ref 850–3900)
Absolute Monocytes: 714 {cells}/uL (ref 200–950)
Basophils Absolute: 42 {cells}/uL (ref 0–200)
Basophils Relative: 0.5 %
Eosinophils Absolute: 101 {cells}/uL (ref 15–500)
Eosinophils Relative: 1.2 %
HCT: 41.4 % (ref 35.0–45.0)
Hemoglobin: 13.4 g/dL (ref 11.7–15.5)
MCH: 29.5 pg (ref 27.0–33.0)
MCHC: 32.4 g/dL (ref 32.0–36.0)
MCV: 91 fL (ref 80.0–100.0)
MPV: 12.9 fL — ABNORMAL HIGH (ref 7.5–12.5)
Monocytes Relative: 8.5 %
Neutro Abs: 3125 {cells}/uL (ref 1500–7800)
Neutrophils Relative %: 37.2 %
Platelets: 373 10*3/uL (ref 140–400)
RBC: 4.55 10*6/uL (ref 3.80–5.10)
RDW: 12.9 % (ref 11.0–15.0)
Total Lymphocyte: 52.6 %
WBC: 8.4 10*3/uL (ref 3.8–10.8)

## 2023-09-16 LAB — COMPLETE METABOLIC PANEL WITH GFR
AG Ratio: 1.6 (calc) (ref 1.0–2.5)
ALT: 22 U/L (ref 6–29)
AST: 26 U/L (ref 10–35)
Albumin: 4.1 g/dL (ref 3.6–5.1)
Alkaline phosphatase (APISO): 78 U/L (ref 37–153)
BUN: 15 mg/dL (ref 7–25)
CO2: 30 mmol/L (ref 20–32)
Calcium: 9.6 mg/dL (ref 8.6–10.4)
Chloride: 104 mmol/L (ref 98–110)
Creat: 0.65 mg/dL (ref 0.50–1.03)
Globulin: 2.6 g/dL (ref 1.9–3.7)
Glucose, Bld: 84 mg/dL (ref 65–99)
Potassium: 4.3 mmol/L (ref 3.5–5.3)
Sodium: 142 mmol/L (ref 135–146)
Total Bilirubin: 0.3 mg/dL (ref 0.2–1.2)
Total Protein: 6.7 g/dL (ref 6.1–8.1)
eGFR: 103 mL/min/{1.73_m2} (ref >=60)

## 2023-09-16 LAB — LIPID PANEL
Cholesterol: 231 mg/dL — ABNORMAL HIGH (ref <200)
HDL: 68 mg/dL (ref >=50)
LDL Cholesterol (Calc): 146 mg/dL — ABNORMAL HIGH
Non-HDL Cholesterol (Calc): 163 mg/dL — ABNORMAL HIGH (ref <130)
Total CHOL/HDL Ratio: 3.4 (calc) (ref <5.0)
Triglycerides: 73 mg/dL (ref <150)

## 2023-09-16 LAB — QUANTIFERON-TB GOLD PLUS
Mitogen-NIL: 8.32 [IU]/mL
NIL: 0.01 [IU]/mL
QuantiFERON-TB Gold Plus: NEGATIVE
TB1-NIL: 0 [IU]/mL
TB2-NIL: 0 [IU]/mL

## 2023-09-17 ENCOUNTER — Other Ambulatory Visit: Payer: Self-pay

## 2023-09-21 ENCOUNTER — Other Ambulatory Visit (HOSPITAL_COMMUNITY): Payer: Self-pay

## 2023-09-26 DIAGNOSIS — G4733 Obstructive sleep apnea (adult) (pediatric): Secondary | ICD-10-CM | POA: Diagnosis not present

## 2023-09-26 DIAGNOSIS — J189 Pneumonia, unspecified organism: Secondary | ICD-10-CM | POA: Diagnosis not present

## 2023-09-28 ENCOUNTER — Ambulatory Visit: Payer: Commercial Managed Care - PPO | Admitting: Internal Medicine

## 2023-09-29 NOTE — Progress Notes (Signed)
 Office Visit Note  Patient: Monique Clark             Date of Birth: 1966/01/27           MRN: [id number]             PCP: Rollene Almarie LABOR, MD Referring: Rollene Almarie LABOR, * Visit Date: 10/13/2023   Subjective:  Follow-up (Patient states she is having some tenderness around her neck. She had an URI about 2 weeks ago. )    Discussed the use of AI scribe software for clinical note transcription with the patient, who gave verbal consent to proceed.  History of Present Illness   Monique Clark is a 57 y.o. female here for follow up for PMR on KEVZARA  200mg  subcut every 14 days.  They report a gastrointestinal upset followed by an upper respiratory infection, and most recently, symptoms suggestive of a urinary tract infection (UTI) that began last Saturday. The UTI symptoms, including a burning sensation during urination, have slightly improved with home remedies and over-the-counter Azo.  In the context of these illnesses, the patient chose to skip their last dose of Kevzara , which was due two weeks ago. They also report a sore throat and neck pain, with a sensation of swelling on one side. Despite these issues, they note that their throat hoarseness is improving.  Prior to these recent illnesses, the patient had been experiencing joint pain, which had not significantly worsened. They had a short course of steroids during their recent illness, which seemed to alleviate some of their symptoms. However, they now report new back pain, which they suspect may be related to the potential UTI. They also note occasional numbness in the lower back region.  The patient also mentions increased difficulty with long-distance walking, necessitating the use of a scooter for such instances. They report some swelling in their left foot, but deny any significant edema. They also report a recent scratch on their elbow, which has been causing some discomfort.       Previous  HPI 06/24/2023 Monique Clark is a 57 y.o. female here for follow up for PMR on KEVZARA  200mg  subcut every 14 days.  She feels symptoms are overall doing better on the medicine consistently but has some swelling and stiffness worse in the left arm. Particularly shoulder and elbow hurts worse with use but also achy at night. Low back pain is worse, had updated MRI on 8/18 with progression of degenerative changes. Following up with orthopedics for this, recommending possible fusion surgery but BMI is not appropriate.   Previous HPI 03/24/2023 Monique Clark is a 57 y.o. female here for follow up for PMR on KEVZARA  200mg  subcut every 14 days.  She started the Kevzara  in March and reports improvement in joint pain and stiffness.  She was able to completely discontinue prednisone  without severe exacerbation of joint pain and weakness.  Her treatment was interrupted for several weeks due to going for her lumbar laminectomy at L4-L5 admitted for this May 31.  This was mildly complicated by requiring evacuation of associated hematoma seroma before discharge home.  Since then is proceeding well still has soreness in the area stitches still in place.  She since resumed Kevzara  so far only 1 dose but notes seeing improvement in joint stiffness starting again within days.   Previous HPI 11/17/2022 Monique Clark is a 57 y.o. female here for follow up for PMR on prednisone  10 mg daily.  May recheck labs previously in January getting back  onto this medication dose still showing very highly elevated serum inflammatory markers.  Symptoms are not too bad while on medication but she has not tolerated tapering.  In the number of infectious issues related to URI but currently this has cleared up as well.     Previous HPI 09/18/22 Monique Clark is a 57 y.o. female here for follow up for PMR on prednisone  10 mg daily. Since the past visit she had a hospital admission for hypoxia suspected as viral URI trigger of  asthma exacerbation. She stopped methotrexate  at that time and has been holding off resuming the medication concern about contributing to infection.  Currently is partly through a course of Augmentin for the persistent upper respiratory infection.  She had lumbar spine injection for ongoing back pain without a great relief of symptoms this time.   Previous HPI 05/20/22 Monique Clark is a 57 y.o. female here for follow up for PMR on methotrexate  15 mg p.o. weekly.  Since her last visit she underwent gastric sleeve resection surgery for weight reduction.  This has still been in the recovery process.  She discontinued the prednisone  perioperatively and has been off this.  She notices multiple symptoms are worse with joint pain and swelling especially in shoulders and elbows.  Not as much lower extremity swelling.   Previous HPI 01/06/2022 Monique Clark is a 57 y.o. female here for follow up for PMR on prednisone  10 mg daily and started methotrexate  10 mg PO weekly last month due to persistent inflammatory lab elevations.  She continues having fatigue is persistent.  Shortness of breath with walking and exertion is slightly improved.  She has not noticed any side effects or intolerance to methotrexate .  She is continuing to try to work on her exercise as a part of preparation for bariatric surgery also awaiting upcoming cardiac evaluation.     Previous HPI 12/02/21 Monique Clark is a 57 y.o. female here for muscle pain and weakness in shoulders and legs concern for history of PMR previously treated with long term prednisone .  She has a history of CVID on IVIG, non hodgkins lymphoma, also has history of bilateral hip replacement. Recently had microdiscectomy about 6 weeks prior. Started prednisone  40 mg daily last month decreased to 20 mg daily and now is down to 10 mg dose. She felt her symptoms improve about 80-90% when starting prednisone  again and tapering has been okay, only slight increase with  this dose reduction. She does not see any particular swelling, discoloration, or rashes. She has noticed some hand tremor more when using her hands that is not typical for her.   Review of Systems  Constitutional:  Positive for fatigue.  HENT:  Positive for mouth dryness. Negative for mouth sores.   Eyes:  Negative for dryness.  Respiratory:  Negative for shortness of breath.   Cardiovascular:  Positive for palpitations. Negative for chest pain.  Gastrointestinal:  Negative for blood in stool, constipation and diarrhea.  Endocrine: Positive for increased urination.  Genitourinary:  Negative for involuntary urination.  Musculoskeletal:  Positive for joint pain, joint pain, joint swelling, myalgias, muscle weakness, morning stiffness, muscle tenderness and myalgias. Negative for gait problem.  Skin:  Negative for color change, rash, hair loss and sensitivity to sunlight.  Allergic/Immunologic: Positive for susceptible to infections.  Neurological:  Positive for headaches. Negative for dizziness.  Hematological:  Positive for swollen glands.  Psychiatric/Behavioral:  Negative for depressed mood and sleep disturbance. The patient is not nervous/anxious.  PMFS History:  Patient Active Problem List   Diagnosis Date Noted   Encounter for general adult medical examination with abnormal findings 03/17/2023   Recurrent fracture of lumbar vertebra (HCC) 03/01/2023   History of lumbar laminectomy for spinal cord decompression 02/27/2023   Seronegative rheumatoid arthritis (HCC) 11/17/2022   Nocturnal hypoxemia 08/29/2022   Pulmonary nodule 08/16/2022   Recurrent herniation of lumbar disc 07/25/2022   OSA on CPAP 05/19/2022   Impingement syndrome of left shoulder 05/13/2022   Leukocytosis 01/20/2022   Morbid obesity (HCC) 01/01/2022   Chronic fatigue and immune dysfunction syndrome (HCC) 01/01/2022   High risk medication use 12/02/2021   PMR (polymyalgia rheumatica) (HCC) 11/05/2021    Status post lumbar spine operative procedure for decompression of spinal cord 09/27/2021   Insomnia 05/28/2021   COVID-19 05/07/2021   Malignant (primary) neoplasm, unspecified (HCC) 03/08/2021   Personal history of urinary (tract) infections 03/08/2021   Personal history of pneumonia (recurrent) 03/08/2021   Elevated serum hCG 07/02/2020   POTS (postural orthostatic tachycardia syndrome) 03/08/2020   Mixed hyperlipidemia 03/21/2019   Paroxysmal atrial fibrillation (HCC)    Localized swelling of left lower extremity    Essential hypertension    Mitral valve regurgitation 06/22/2017   CVID (common variable immunodeficiency) (HCC) 05/07/2015   Prediabetes 01/20/2013   History of non-Hodgkin's lymphoma 12/21/2012    Past Medical History:  Diagnosis Date   Abscess    Arthritis    Asthma    November 2023- hospitalized   Atrial fibrillation (HCC)    Cardiac arrhythmia due to congenital heart disease    trace MR 08/15/20 echo ([place])- pt denies congenital heart disease   CVID (common variable immunodeficiency) (HCC) 2015   takes Hizentra    Diverticulosis    Elevated cholesterol    GERD (gastroesophageal reflux disease)    Headache    High blood pressure    Non Hodgkin's lymphoma (HCC) 1998   Obesity    Personal history of gestational diabetes    PMR (polymyalgia rheumatica) (HCC)    takes Kevzara    Pneumonia    PONV (postoperative nausea and vomiting)    POTS (postural orthostatic tachycardia syndrome)    Pre-diabetes    Sleep apnea    CPAP with 2L O2   UTI (urinary tract infection)     Family History  Problem Relation Age of Onset   Atrial fibrillation Mother    Colon cancer Father    Cancer Father        Colon   Colon cancer Sister    Basal cell carcinoma Brother    Atrial fibrillation Maternal Uncle    Atrial fibrillation Maternal Grandmother    Atrial fibrillation Maternal Grandfather    Heart attack Maternal Grandfather    Ovarian cancer Neg Hx     Stomach cancer Neg Hx    Rectal cancer Neg Hx    Past Surgical History:  Procedure Laterality Date   ABDOMINAL HYSTERECTOMY     APPENDECTOMY     CESAREAN SECTION     CHOLECYSTECTOMY     COLONOSCOPY     HEMATOMA EVACUATION N/A 03/04/2023   Procedure: EVACUATION HEMATOMA SEROMA WITH RECLOSURE LUMBAR INCISION;  Surgeon: Barbarann Oneil BROCKS, MD;  Location: [facility] OR;  Service: Orthopedics;  Laterality: N/A;   JOINT REPLACEMENT Left    hip   JOINT REPLACEMENT Right    Right Hip 2022   LAPAROSCOPIC GASTRIC SLEEVE RESECTION  04/29/2022   left shoulder repair     rotator cuff repair  LUMBAR LAMINECTOMY N/A 09/18/2021   Procedure: RIGHT LUMBAR FOUR-FIVE MICRODISCECTOMY;  Surgeon: Barbarann Oneil BROCKS, MD;  Location: [facility] OR;  Service: Orthopedics;  Laterality: N/A;   LUMBAR LAMINECTOMY/DECOMPRESSION MICRODISCECTOMY N/A 02/27/2023   Procedure: L4-5 LUMBAR DECOMPRESSION, MICRODISCECTOMY;  Surgeon: Barbarann Oneil BROCKS, MD;  Location: [facility] OR;  Service: Orthopedics;  Laterality: N/A;   LUNG REMOVAL, PARTIAL Right 2009   VATS, wedge resection partial lobectomy, done at [facility]   myringostomy Bilateral    Myringotomy   SPLENECTOMY, TOTAL  1998   TONSILLECTOMY AND ADENOIDECTOMY     UPPER GI ENDOSCOPY N/A 04/29/2022   Procedure: UPPER GI ENDOSCOPY;  Surgeon: Kinsinger, Herlene Righter, MD;  Location: [facility] ORS;  Service: General;  Laterality: N/A;   Social History   Social History Narrative   Not on file   Immunization History  Administered Date(s) Administered   Influenza-Unspecified 07/27/2020, 07/08/2022   PFIZER(Purple Top)SARS-COV-2 Vaccination 09/24/2019, 10/12/2019     Objective: Vital Signs: BP 121/76 (BP Location: Right Arm, Patient Position: Sitting, Cuff Size: Large)   Pulse (!) 59   Resp 13   Ht 5' 5 (1.651 m)   Wt 290 lb (131.5 kg) Comment: Patient reported. [place] refused to weigh.  BMI 48.26 kg/m    Physical Exam HENT:     Mouth/Throat:     Mouth: Mucous membranes are dry.     Pharynx:  Oropharynx is clear.  Eyes:     Conjunctiva/sclera: Conjunctivae normal.  Cardiovascular:     Rate and Rhythm: Normal rate and regular rhythm.  Pulmonary:     Effort: Pulmonary effort is normal.     Breath sounds: Normal breath sounds.  Musculoskeletal:     Right lower leg: No edema.     Left lower leg: No edema.  Lymphadenopathy:     Cervical: No cervical adenopathy.  Skin:    General: Skin is warm and dry.     Findings: No rash.  Neurological:     Mental Status: She is alert.  Psychiatric:        Mood and Affect: Mood normal.     Musculoskeletal Exam:  Shoulders full ROM pain with overhead abduction and reaching behind back, no radiation down arm or into back Elbows decreased extension ROM, mild tenderness to pressure and movement left elbow Wrists full ROM no tenderness or swelling Fingers full ROM no tenderness or swelling Tenderness to pressure in mid and low back multiple areas, no radiation Knees full ROM no tenderness or swelling Ankles full ROM no tenderness or swelling   Investigation: No additional findings.  Imaging: No results found.   Recent Labs: Lab Results  Component Value Date   WBC 8.4 09/14/2023   HGB 13.4 09/14/2023   PLT 373 09/14/2023   NA 142 09/14/2023   K 4.3 09/14/2023   CL 104 09/14/2023   CO2 30 09/14/2023   GLUCOSE 84 09/14/2023   BUN 15 09/14/2023   CREATININE 0.65 09/14/2023   BILITOT 0.3 09/14/2023   ALKPHOS 70 08/16/2022   AST 26 09/14/2023   ALT 22 09/14/2023   PROT 6.7 09/14/2023   ALBUMIN 3.6 08/16/2022   CALCIUM  9.6 09/14/2023   GFRAA >60 03/18/2020   QFTBGOLDPLUS NEGATIVE 09/14/2023    Speciality Comments: No specialty comments available.  Procedures:  No procedures performed Allergies: Chlorhexidine , Tetanus-diphtheria toxoids td, Nizatidine, Promethazine , and Tetanus toxoids   Assessment / Plan:     Visit Diagnoses: PMR (polymyalgia rheumatica) (HCC) - Plan: Sedimentation rate, C-reactive protein Skipped  last Kevzara   dose due to illness. Recent Medrol  pack improved symptoms. Current pain appears more consistent with myofascial pain and osteoarthritis. -Checking ESR and CRP disease activity assessment -Plan to resume Kevzara  this week if infection is ruled out, else after finishing abtx course.  High risk medication use - Kevzara  200 mg subcu q. 14 days. Recent blood count and metabolic panel and TB screening from December okay.  Dysuria - Plan: Urinalysis, Routine w reflex microscopic, Urine Culture, sulfamethoxazole -trimethoprim  (BACTRIM  DS) 800-160 MG tablet, fluconazole  (DIFLUCAN ) 200 MG tablet Symptoms since Saturday, improving with cranberry juice and Azo. Increased risk due to Kevzara  use. -Perform urinalysis and urine culture to confirm infection and guide antibiotic therapy if necessary. Back Pain  Hyperlipidemia On Kevzara  treatment, recent non-fasting lipid panel. -Check fasting lipid panel today.   Orders: Orders Placed This Encounter  Procedures   Urine Culture   MICROSCOPIC MESSAGE   Sedimentation rate   C-reactive protein   Urinalysis, Routine w reflex microscopic   Meds ordered this encounter  Medications   sulfamethoxazole -trimethoprim  (BACTRIM  DS) 800-160 MG tablet    Sig: Take 1 tablet by mouth 2 (two) times daily for 5 days.    Dispense:  10 tablet    Refill:  0   fluconazole  (DIFLUCAN ) 200 MG tablet    Sig: Take 1 tablet (200 mg total) by mouth daily for 5 days.    Dispense:  5 tablet    Refill:  0     Follow-Up Instructions: Return in about 3 months (around 01/11/2024) for PMR on KEV f/u 3mos.   Lonni LELON Ester, MD  Note - This record has been created using [employer].  Chart creation errors have been sought, but may not always  have been located. Such creation errors do not reflect on  the standard of medical care.

## 2023-10-08 ENCOUNTER — Encounter: Payer: Self-pay | Admitting: Orthopaedic Surgery

## 2023-10-09 ENCOUNTER — Other Ambulatory Visit: Payer: Self-pay

## 2023-10-12 ENCOUNTER — Other Ambulatory Visit: Payer: Self-pay

## 2023-10-12 NOTE — Progress Notes (Signed)
 Specialty Pharmacy Refill Coordination Note  Monique Clark is a 58 y.o. female contacted today regarding refills of specialty medication(s) Sarilumab  (Kevzara )   Patient requested Delivery   Delivery date: 10/14/23   Verified address: [address]   Medication will be filled on 10/13/23. [employer] Delivery.

## 2023-10-12 NOTE — Progress Notes (Signed)
 Specialty Pharmacy Ongoing Clinical Assessment Note  Monique Clark is a 58 y.o. female who is being followed by the specialty pharmacy service for RxSp Rheumatoid Arthritis   Patient's specialty medication(s) reviewed today: Sarilumab  (Kevzara )   Missed doses in the last 4 weeks: 0   Patient/Caregiver did not have any additional questions or concerns.   Therapeutic benefit summary: Patient is achieving benefit   Adverse events/side effects summary: No adverse events/side effects   Patient's therapy is appropriate to: Continue    Goals Addressed             This Visit's Progress    Minimize recurrence of flares       Patient is on track. Patient will maintain adherence         Follow up:  6 months  Mitzie GORMAN [facility]t Specialty Pharmacist

## 2023-10-13 ENCOUNTER — Ambulatory Visit: Payer: Commercial Managed Care - PPO | Attending: Internal Medicine | Admitting: Internal Medicine

## 2023-10-13 ENCOUNTER — Other Ambulatory Visit: Payer: Self-pay

## 2023-10-13 ENCOUNTER — Encounter: Payer: Self-pay | Admitting: Internal Medicine

## 2023-10-13 VITALS — BP 121/76 | HR 59 | Resp 13 | Ht 65.0 in | Wt 290.0 lb

## 2023-10-13 DIAGNOSIS — Z79899 Other long term (current) drug therapy: Secondary | ICD-10-CM

## 2023-10-13 DIAGNOSIS — R300 Dysuria: Secondary | ICD-10-CM | POA: Diagnosis not present

## 2023-10-13 DIAGNOSIS — M353 Polymyalgia rheumatica: Secondary | ICD-10-CM

## 2023-10-13 DIAGNOSIS — M0600 Rheumatoid arthritis without rheumatoid factor, unspecified site: Secondary | ICD-10-CM | POA: Diagnosis not present

## 2023-10-14 ENCOUNTER — Encounter: Payer: Self-pay | Admitting: Internal Medicine

## 2023-10-14 LAB — SEDIMENTATION RATE: Sed Rate: 56 mm/h — ABNORMAL HIGH (ref 0–30)

## 2023-10-14 LAB — C-REACTIVE PROTEIN: CRP: 32.7 mg/L — ABNORMAL HIGH (ref <8.0)

## 2023-10-15 ENCOUNTER — Other Ambulatory Visit: Payer: Self-pay | Admitting: Orthopedic Surgery

## 2023-10-15 LAB — URINALYSIS, ROUTINE W REFLEX MICROSCOPIC
Bilirubin Urine: NEGATIVE
Glucose, UA: NEGATIVE
Hgb urine dipstick: NEGATIVE
Hyaline Cast: NONE SEEN /LPF
Ketones, ur: NEGATIVE
Nitrite: NEGATIVE
Protein, ur: NEGATIVE
Specific Gravity, Urine: 1.019 (ref 1.001–1.035)
pH: <=5 (ref 5.0–8.0)

## 2023-10-15 LAB — URINE CULTURE
MICRO NUMBER:: 15953244
SPECIMEN QUALITY:: ADEQUATE

## 2023-10-15 LAB — MICROSCOPIC MESSAGE

## 2023-10-15 MED ORDER — SULFAMETHOXAZOLE-TRIMETHOPRIM 800-160 MG PO TABS: 1.0000 | ORAL_TABLET | Freq: Two times a day (BID) | ORAL | 0 refills | Status: AC

## 2023-10-15 MED ORDER — METHOCARBAMOL 500 MG PO TABS
500.0000 mg | ORAL_TABLET | Freq: Four times a day (QID) | ORAL | 0 refills | Status: DC | PRN
  Filled 2023-10-15: qty 30, 8d supply, fill #0

## 2023-10-15 MED ORDER — FLUCONAZOLE 200 MG PO TABS: 200.0000 mg | ORAL_TABLET | Freq: Every day | ORAL | 0 refills | Status: AC

## 2023-10-16 ENCOUNTER — Other Ambulatory Visit: Payer: Self-pay

## 2023-10-16 NOTE — Telephone Encounter (Signed)
Rx sent for bactrim and diflucan x5 days for symptomatic E. Coli UTI.

## 2023-10-17 ENCOUNTER — Other Ambulatory Visit (HOSPITAL_COMMUNITY): Payer: Self-pay

## 2023-10-26 DIAGNOSIS — D801 Nonfamilial hypogammaglobulinemia: Secondary | ICD-10-CM | POA: Diagnosis not present

## 2023-10-27 DIAGNOSIS — G4733 Obstructive sleep apnea (adult) (pediatric): Secondary | ICD-10-CM | POA: Diagnosis not present

## 2023-10-27 DIAGNOSIS — J189 Pneumonia, unspecified organism: Secondary | ICD-10-CM | POA: Diagnosis not present

## 2023-10-29 ENCOUNTER — Other Ambulatory Visit: Payer: Self-pay

## 2023-11-02 ENCOUNTER — Encounter: Payer: Self-pay | Admitting: Internal Medicine

## 2023-11-02 ENCOUNTER — Ambulatory Visit: Payer: Commercial Managed Care - PPO | Admitting: Internal Medicine

## 2023-11-02 ENCOUNTER — Other Ambulatory Visit (HOSPITAL_COMMUNITY): Payer: Self-pay

## 2023-11-02 VITALS — BP 138/82 | HR 89 | Temp 98.3°F | Ht 65.0 in | Wt 306.0 lb

## 2023-11-02 DIAGNOSIS — B3781 Candidal esophagitis: Secondary | ICD-10-CM | POA: Diagnosis not present

## 2023-11-02 DIAGNOSIS — Z20828 Contact with and (suspected) exposure to other viral communicable diseases: Secondary | ICD-10-CM | POA: Diagnosis not present

## 2023-11-02 DIAGNOSIS — G9332 Myalgic encephalomyelitis/chronic fatigue syndrome: Secondary | ICD-10-CM

## 2023-11-02 DIAGNOSIS — D839 Common variable immunodeficiency, unspecified: Secondary | ICD-10-CM | POA: Diagnosis not present

## 2023-11-02 DIAGNOSIS — M0600 Rheumatoid arthritis without rheumatoid factor, unspecified site: Secondary | ICD-10-CM

## 2023-11-02 DIAGNOSIS — D8989 Other specified disorders involving the immune mechanism, not elsewhere classified: Secondary | ICD-10-CM | POA: Diagnosis not present

## 2023-11-02 LAB — POCT INFLUENZA A/B
Influenza A, POC: NEGATIVE
Influenza B, POC: NEGATIVE

## 2023-11-02 MED ORDER — NYSTATIN 100000 UNIT/ML MT SUSP: 5.0000 mL | Freq: Three times a day (TID) | OROMUCOSAL | 0 refills | Status: DC

## 2023-11-02 MED ORDER — ZEPBOUND 7.5 MG/0.5ML ~~LOC~~ SOAJ: 7.5000 mg | SUBCUTANEOUS | 0 refills | Status: DC

## 2023-11-02 MED ORDER — ZEPBOUND 5 MG/0.5ML ~~LOC~~ SOAJ: 5.0000 mg | SUBCUTANEOUS | 0 refills | Status: DC

## 2023-11-02 MED ORDER — ZEPBOUND 2.5 MG/0.5ML ~~LOC~~ SOAJ: 2.5000 mg | SUBCUTANEOUS | 0 refills | Status: DC

## 2023-11-02 NOTE — Assessment & Plan Note (Signed)
Rx nystatin TID for 1 week swish and swallow. Suspect related to multiple recent antibiotics.

## 2023-11-02 NOTE — Assessment & Plan Note (Signed)
Rx zepbound months 1-3 standard titration and she will check in after that. Weight is up about 20 pounds from prior visit she is doing diet and exercise which has not consistently helped.

## 2023-11-02 NOTE — Progress Notes (Signed)
   Subjective:   Patient ID: Monique Clark, female    DOB: Jan 13, 1966, 58 y.o.   MRN: [id number]  HPI The patient is a 58 YO female coming in for sore throat and white discharge at back of throat. Took diflucan daily for 5 days and no change. She did have to take multiple courses of antibiotics in the last 2-3 months. Some change in voice. Minimal problems with swallowing some delay with initiation of swallow. Also exposure to flu in the last day wants to be tested if possible. Also struggling with weight issues and increasing although she is exercising and monitoring diet.   Review of Systems  Constitutional: Negative.   HENT:  Positive for sore throat, trouble swallowing and voice change.   Eyes: Negative.   Respiratory:  Negative for cough, chest tightness and shortness of breath.   Cardiovascular:  Negative for chest pain, palpitations and leg swelling.  Gastrointestinal:  Negative for abdominal distention, abdominal pain, constipation, diarrhea, nausea and vomiting.  Musculoskeletal: Negative.   Skin: Negative.   Neurological: Negative.   Psychiatric/Behavioral: Negative.      Objective:  Physical Exam Constitutional:      Appearance: She is well-developed. She is obese.  HENT:     Head: Normocephalic and atraumatic.     Ears:     Comments: Oropharynx with white on posterior tongue Neck:     Comments: Diffuse cervical adenopathy right more than left shody and no discrete mass Cardiovascular:     Rate and Rhythm: Normal rate and regular rhythm.  Pulmonary:     Effort: Pulmonary effort is normal. No respiratory distress.     Breath sounds: Normal breath sounds. No wheezing or rales.  Abdominal:     General: Bowel sounds are normal. There is no distension.     Palpations: Abdomen is soft.     Tenderness: There is no abdominal tenderness. There is no rebound.  Musculoskeletal:     Cervical back: Normal range of motion.  Lymphadenopathy:     Cervical: Cervical adenopathy  present.  Skin:    General: Skin is warm and dry.  Neurological:     Mental Status: She is alert and oriented to person, place, and time.     Coordination: Coordination normal.     Vitals:   11/02/23 0957  BP: 138/82  Pulse: 89  Temp: 98.3 F (36.8 C)  TempSrc: Oral  SpO2: 97%  Weight: (!) 306 lb (138.8 kg)  Height: 5\' 5"  (1.651 m)    Assessment & Plan:

## 2023-11-02 NOTE — Assessment & Plan Note (Signed)
Overall stable but she has had several infections recently which has been a setback.

## 2023-11-02 NOTE — Patient Instructions (Signed)
We have sent in the nystatin to do 5 mL swish and swallow 3 times a day for 1 week

## 2023-11-02 NOTE — Assessment & Plan Note (Signed)
POC flu testing done due to exposure which is negative. Will not treat with tamiflu at this time. Low threshold to start if she develops symptoms.

## 2023-11-02 NOTE — Assessment & Plan Note (Signed)
Does have candida esophagitis today.

## 2023-11-04 ENCOUNTER — Other Ambulatory Visit (HOSPITAL_COMMUNITY): Payer: Self-pay

## 2023-11-09 DIAGNOSIS — D801 Nonfamilial hypogammaglobulinemia: Secondary | ICD-10-CM | POA: Diagnosis not present

## 2023-11-13 ENCOUNTER — Encounter: Payer: Self-pay | Admitting: Internal Medicine

## 2023-11-13 DIAGNOSIS — M0600 Rheumatoid arthritis without rheumatoid factor, unspecified site: Secondary | ICD-10-CM

## 2023-11-14 ENCOUNTER — Other Ambulatory Visit: Payer: Self-pay | Admitting: Orthopaedic Surgery

## 2023-11-14 ENCOUNTER — Other Ambulatory Visit: Payer: Self-pay | Admitting: Internal Medicine

## 2023-11-14 ENCOUNTER — Other Ambulatory Visit: Payer: Self-pay | Admitting: Orthopedic Surgery

## 2023-11-14 DIAGNOSIS — Z79899 Other long term (current) drug therapy: Secondary | ICD-10-CM

## 2023-11-14 DIAGNOSIS — M353 Polymyalgia rheumatica: Secondary | ICD-10-CM

## 2023-11-16 ENCOUNTER — Other Ambulatory Visit: Payer: Self-pay | Admitting: Internal Medicine

## 2023-11-16 ENCOUNTER — Other Ambulatory Visit: Payer: Self-pay

## 2023-11-16 ENCOUNTER — Other Ambulatory Visit (HOSPITAL_COMMUNITY): Payer: Self-pay

## 2023-11-16 DIAGNOSIS — Z79899 Other long term (current) drug therapy: Secondary | ICD-10-CM

## 2023-11-16 DIAGNOSIS — M353 Polymyalgia rheumatica: Secondary | ICD-10-CM

## 2023-11-16 MED ORDER — METHOCARBAMOL 500 MG PO TABS
500.0000 mg | ORAL_TABLET | Freq: Four times a day (QID) | ORAL | 0 refills | Status: DC | PRN
  Filled 2023-11-16 – 2023-12-07 (×2): qty 30, 8d supply, fill #0

## 2023-11-16 NOTE — Telephone Encounter (Signed)
Last Fill: 08/03/2023  Labs: 09/14/2023 MPV 12.9, Absolute Lymphocytes 4,418  TB Gold: 09/14/2023 Neg    Next Visit: 01/11/2024  Last Visit: 10/13/2023  DX:PMR (polymyalgia rheumatica)   Current Dose per office note 10/13/2023: Kevzara 200 mg subcu q. 14 days.   Okay to refill Monique Clark?

## 2023-11-16 NOTE — Telephone Encounter (Signed)
I have ordered can you let [employer] know and patient

## 2023-11-16 NOTE — Assessment & Plan Note (Signed)
Needs new rollator for ambulation hers is falling apart and about 58 years old. New one ordered she is 306 pounds

## 2023-11-17 ENCOUNTER — Other Ambulatory Visit (HOSPITAL_COMMUNITY): Payer: Self-pay

## 2023-11-17 ENCOUNTER — Other Ambulatory Visit: Payer: Self-pay

## 2023-11-17 ENCOUNTER — Encounter: Payer: Self-pay | Admitting: Pharmacist

## 2023-11-17 MED ORDER — GABAPENTIN 300 MG PO CAPS
300.0000 mg | ORAL_CAPSULE | Freq: Two times a day (BID) | ORAL | 3 refills | Status: DC
  Filled 2023-11-17: qty 60, 30d supply, fill #0
  Filled 2024-01-22: qty 60, 30d supply, fill #1
  Filled 2024-02-23: qty 60, 30d supply, fill #2

## 2023-11-19 ENCOUNTER — Other Ambulatory Visit: Payer: Self-pay

## 2023-11-19 ENCOUNTER — Emergency Department (HOSPITAL_BASED_OUTPATIENT_CLINIC_OR_DEPARTMENT_OTHER): Payer: Commercial Managed Care - PPO | Admitting: Radiology

## 2023-11-19 ENCOUNTER — Emergency Department (HOSPITAL_BASED_OUTPATIENT_CLINIC_OR_DEPARTMENT_OTHER)
Admission: EM | Admit: 2023-11-19 | Discharge: 2023-11-19 | Disposition: A | Discharge status: Home / Self Care | Payer: Commercial Managed Care - PPO | Attending: Emergency Medicine | Admitting: Emergency Medicine

## 2023-11-19 ENCOUNTER — Encounter (HOSPITAL_BASED_OUTPATIENT_CLINIC_OR_DEPARTMENT_OTHER): Payer: Self-pay | Admitting: Emergency Medicine

## 2023-11-19 DIAGNOSIS — M79672 Pain in left foot: Secondary | ICD-10-CM | POA: Diagnosis not present

## 2023-11-19 DIAGNOSIS — M7989 Other specified soft tissue disorders: Secondary | ICD-10-CM | POA: Diagnosis not present

## 2023-11-19 DIAGNOSIS — M25572 Pain in left ankle and joints of left foot: Secondary | ICD-10-CM | POA: Diagnosis not present

## 2023-11-19 DIAGNOSIS — M19072 Primary osteoarthritis, left ankle and foot: Secondary | ICD-10-CM | POA: Diagnosis not present

## 2023-11-19 NOTE — Discharge Instructions (Addendum)
You were evaluated in the emergency room for left foot pain.  Your imaging did not show any acute abnormality.  There are some arthritic changes present.  You were fitted with a left foot boot.  Follow-up with your orthopedic group in the near future.  You may use Tylenol 1000 mg every 4-6 hours up to 3 times a day.  If you experience any new or worsening symptoms including extreme worsening of pain, leg numbness, rash please return to the emergency room.

## 2023-11-19 NOTE — ED Triage Notes (Signed)
Swelling pain left foot x1 week. Feels similar to previous stress fracture in other foot. Ambulates with walker. Denies falls/trauma.

## 2023-11-19 NOTE — ED Provider Notes (Signed)
[place] EMERGENCY DEPARTMENT AT [facility] Provider Note   CSN: [id number] Arrival date & time: 11/19/23  1816     History  Chief Complaint  Patient presents with   Foot Pain    left    Monique Clark is a 58 y.o. female who presents with complaints of left lower extremity pain x 1 week.  Notes that she had to patient reports prior stress fracture in her contralateral foot.  States symptoms feel very similar.  Denies any numbness or tingling.  She is able to weight-bear.  She uses a walker at baseline.    Foot Pain       Home Medications Prior to Admission medications   Medication Sig Start Date End Date Taking? Authorizing Provider  albuterol (VENTOLIN HFA) 108 (90 Base) MCG/ACT inhaler Inhale 1 puff into the lungs every 6 (six) hours as needed for shortness of breath or wheezing. 08/04/23   Myrlene Broker, MD  calcium carbonate (TUMS) 500 MG chewable tablet Chew 1 tablet by mouth 3 (three) times daily.    [provider]  Cholecalciferol (VITAMIN D3) 1.25 MG (50000 UT) CAPS Take 1 capsule (1.25 mg total) by mouth 2 (two) times a week as directed Patient taking differently: Take 50,000 Units by mouth every 14 (fourteen) days. Mondays 10/27/22   Myrlene Broker, MD  Diclofenac Sodium CR 100 MG 24 hr tablet Take 1 tablet (100 mg total) by mouth daily. Patient not taking: Reported on 06/24/2023 05/17/23   Palumbo, April, MD  diphenhydrAMINE (BENADRYL) 25 MG tablet Take 25-50 mg by mouth See admin instructions. Take 25 mg by mouth as needed for allergies or itching and take 50 mg by mouth with Hizentra infusion    [provider]  docusate sodium (COLACE) 100 MG capsule Take 200 mg by mouth at bedtime. Patient not taking: Reported on 10/13/2023    [provider]  DULoxetine (CYMBALTA) 60 MG capsule Take 1 capsule (60 mg total) by mouth daily. 03/16/23   Myrlene Broker, MD  famotidine (PEPCID) 20 MG tablet Take 20 mg by mouth  daily as needed for heartburn or indigestion.    [provider]  flecainide (TAMBOCOR) 100 MG tablet Take 1 tablet (100 mg total) by mouth every 12 (twelve) hours. 12/05/22     gabapentin (NEURONTIN) 300 MG capsule Take 1 capsule (300 mg total) by mouth 2 (two) times daily. 11/17/23   [name], MD  HYDROcodone-acetaminophen (NORCO/VICODIN) 5-325 MG tablet Take 1 tablet by mouth every 6 (six) hours as needed for moderate pain (pain score 4-6). Patient not taking: Reported on 11/02/2023 08/13/23   [name], MD  Immune Globulin, Human, (HIZENTRA) 4 GM/20ML SOLN 36 grams ( ) subcutaneously every 2 weeks via pump Patient taking differently: Inject 36 g into the skin every 14 (fourteen) days. 11/17/22     lidocaine (LIDODERM) 5 % Place 1 patch onto the skin daily. Remove & Discard patch within 12 hours or as directed by MD Patient not taking: Reported on 11/02/2023 05/17/23   Palumbo, April, MD  methocarbamol (ROBAXIN) 500 MG tablet Take 1 tablet (500 mg total) by mouth 2 (two) times daily. 05/17/23   Palumbo, April, MD  methocarbamol (ROBAXIN) 500 MG tablet Take 1 tablet (500 mg total) by mouth every 6 (six) hours as needed for muscle spasms. 11/16/23   London Sheer, MD  methylPREDNISolone (MEDROL DOSEPAK) 4 MG TBPK tablet Take as prescribed on the box Patient not taking: Reported on  11/02/2023 05/15/23   London Sheer, MD  Multiple Vitamin (MULTIVITAMIN WITH MINERALS) TABS tablet Take 1 tablet by mouth at bedtime.    [provider]  nebivolol (BYSTOLIC) 5 MG tablet Take 1 tablet (5 mg total) by mouth daily. Patient taking differently: Take 5 mg by mouth at bedtime. 02/09/23     NON FORMULARY Pt uses a cpap nightly w/2 liters of oxygen    [provider]  nystatin (MYCOSTATIN) 100000 UNIT/ML suspension Take 5 mLs (500,000 Units total) by mouth in the morning, at noon, and at bedtime. 11/02/23   Myrlene Broker, MD  rivaroxaban (XARELTO) 20 MG TABS tablet Take 1  tablet (20 mg total) by mouth daily. 07/01/23     Sarilumab (KEVZARA) 200 MG/1. SOAJ Inject 200 mg into the skin every 14 (fourteen) days. 08/03/23   Rice, Jamesetta Orleans, MD  tirzepatide (ZEPBOUND) 2.5 MG/0.5ML Pen Inject 2.5 mg into the skin once a week. 11/02/23   Myrlene Broker, MD  tirzepatide [facility]) 5 MG/0.5ML Pen Inject 5 mg into the skin once a week. 11/02/23   Myrlene Broker, MD  tirzepatide (ZEPBOUND) 7.5 MG/0.5ML Pen Inject 7.5 mg into the skin once a week. 11/02/23   Myrlene Broker, MD  zolpidem (AMBIEN) 5 MG tablet Take 1 tablet (5 mg total) by mouth at bedtime as needed for sleep. 08/25/23   Myrlene Broker, MD      Allergies    Chlorhexidine, Tetanus-diphtheria toxoids td, Nizatidine, Promethazine, and Tetanus toxoids    Review of Systems   Review of Systems  Physical Exam Updated Vital Signs BP (!) 167/90 (BP Location: Right Arm)   Pulse 60   Temp 98.7 F (37.1 C)   Resp 18   SpO2 98%  Physical Exam Vitals and nursing note reviewed.  Constitutional:      General: She is not in acute distress.    Appearance: She is well-developed.  HENT:     Head: Normocephalic and atraumatic.  Eyes:     Conjunctiva/sclera: Conjunctivae normal.  Cardiovascular:     Rate and Rhythm: Normal rate and regular rhythm.     Heart sounds: No murmur heard. Pulmonary:     Effort: Pulmonary effort is normal. No respiratory distress.     Breath sounds: Normal breath sounds.  Musculoskeletal:     Cervical back: Neck supple.     Comments: Examination of left lower extremity with lateral midfoot generalized tenderness.  No significant swelling.  There is no overlying erythema or warmth.  No fluctuance, induration or crepitus.  No rashes or wounds.  Full range of motion of ankle and digits.  NVI, cap refill less than 2 secs  Skin:    General: Skin is warm and dry.     Capillary Refill: Capillary refill takes less than 2 seconds.  Neurological:     Mental Status:  She is alert.  Psychiatric:        Mood and Affect: Mood normal.     ED Results / Procedures / Treatments   Labs (all labs ordered are listed, but only abnormal results are displayed) Labs Reviewed - No data to display  EKG None  Radiology DG Ankle Complete Left Result Date: 11/19/2023 CLINICAL DATA:  Lateral ankle pain, swelling EXAM: LEFT ANKLE COMPLETE - 3+ VIEW COMPARISON:  None available FINDINGS: Corticated bone fragment adjacent to the lateral malleolus appears to reflect a chronic avulsion or secondary ossification center. No visible acute fracture. Degenerative changes in the midfoot  and hindfoot. Ankle joint maintained. Lateral soft tissue swelling. IMPRESSION: Probable old avulsion fracture or secondary ossification center adjacent to the lateral malleolus and lateral talus. There is overlying soft tissue swelling, but no definite acute fracture seen. Electronically Signed   By: Charlett Nose M.D.   On: 11/19/2023 19:13   DG Foot Complete Left Result Date: 11/19/2023 CLINICAL DATA:  Lateral ankle pain, swelling EXAM: LEFT FOOT - COMPLETE 3+ VIEW COMPARISON:  None Available. FINDINGS: No acute bony abnormality. Specifically, no fracture, subluxation, or dislocation. Degenerative changes in the hindfoot and midfoot. Soft tissues are intact. IMPRESSION: No acute bony abnormality. Electronically Signed   By: Charlett Nose M.D.   On: 11/19/2023 19:11    Procedures Procedures    Medications Ordered in ED Medications - No data to display  ED Course/ Medical Decision Making/ A&P                                 Medical Decision Making Amount and/or Complexity of Data Reviewed Radiology: ordered.   This patient presents to the ED with chief complaint(s) of foot pain.  The complaint involves an extensive differential diagnosis and also carries with it a high risk of complications and morbidity.  Pertinent past medical history as listed in HPI  The differential diagnosis includes   Fracture, dislocation, sprain, stress fracture, septic joint, gout, cellulitis, tendon/vascular/nerve injury The initial plan is to  Plain films of foot and ankle ordered in triage Additional history obtained: Records reviewed Care Everywhere/External Records  Initial Assessment:   Patient presents hypertensive to 160s over 90 with complaints of atraumatic left foot pain.  She has generalized lateral midfoot tenderness without significant swelling.  There is no erythema, warmth, fluctuance induration, crepitus or rash.  No suspicion for cellulitis, abscess, ulcer or infectious process.  She is NVI with full range of motion and strength.  Do not suspect septic joint or gout.  No suspicion for tendon/vascular/or nerve injury.  Overall patient is most concerned for stress fracture although there is no evidence of this on x-ray.  Independent ECG interpretation:  none  Independent labs interpretation:  The following labs were independently interpreted:  none  Independent visualization and interpretation of imaging: I independently visualized the following imaging with scope of interpretation limited to determining acute life threatening conditions related to emergency care: Foot and ankle x-ray, which revealed mild arthritic changes without acute abnormality  Treatment and Reassessment: None  Reviewed x-ray findings with patient.  Informed her that there is no acute abnormality.  Given similar presentation to previous stress fracture on contralateral foot she is interested in short leg boot.  Discussed discharge plan.  She is agreeable.  She has a preferred Ortho group she will follow-up with.   Consultations obtained:   none  Disposition:   Patient fitted with short leg boot and discharged home with Ortho follow-up.  The patient has been appropriately medically screened and/or stabilized in the ED. I have low suspicion for any other emergent medical condition which would require further  screening, evaluation or treatment in the ED or require inpatient management. At time of discharge the patient is hemodynamically stable and in no acute distress. I have discussed work-up results and diagnosis with patient and answered all questions. Patient is agreeable with discharge plan. We discussed strict return precautions for returning to the emergency department and they verbalized understanding.     Social Determinants of Health:  none  This note was dictated with voice recognition software.  Despite best efforts at proofreading, errors may have occurred which can change the documentation meaning.          Final Clinical Impression(s) / ED Diagnoses Final diagnoses:  Left foot pain    Rx / DC Orders ED Discharge Orders     None         Fabienne Bruns 11/19/23 1956    Linwood Dibbles, MD 11/20/23 [id number]

## 2023-11-20 ENCOUNTER — Other Ambulatory Visit: Payer: Self-pay

## 2023-11-24 ENCOUNTER — Encounter: Payer: Self-pay | Admitting: Orthopaedic Surgery

## 2023-11-24 ENCOUNTER — Ambulatory Visit: Payer: Commercial Managed Care - PPO | Admitting: Orthopaedic Surgery

## 2023-11-24 VITALS — BP 150/92 | HR 73

## 2023-11-24 DIAGNOSIS — M353 Polymyalgia rheumatica: Secondary | ICD-10-CM

## 2023-11-24 DIAGNOSIS — M25572 Pain in left ankle and joints of left foot: Secondary | ICD-10-CM | POA: Diagnosis not present

## 2023-11-24 NOTE — Progress Notes (Signed)
 Office Visit Note   Patient: Monique Clark           Date of Birth: 22-Aug-1966           MRN: [id number] Visit Date: 11/24/2023              Requested by: Myrlene Broker, MD [address] [place],  [place] [address] PCP: Myrlene Broker, MD   Assessment & Plan: Visit Diagnoses:  1. PMR (polymyalgia rheumatica) (HCC)   2. Pain in left ankle and joints of left foot     Plan: Patient can continue the cam boot recheck 4 weeks if she is having persistent problems we will consider intra-articular injection in her ankle.  She is improving and cam boot hopefully this will take care of her symptoms recheck 4 weeks.  Follow-Up Instructions: Return in about 4 weeks (around 12/22/2023).   Orders:  No orders of the defined types were placed in this encounter.  No orders of the defined types were placed in this encounter.     Procedures: No procedures performed   Clinical Data: No additional findings.   Subjective: Chief Complaint  Patient presents with   Left Ankle - Pain    HPI 58 year old female with morbid obesity previous L4-5 surgeryWith microdiscectomy and decompression May 2024.  She had reexploration for hematoma seroma which was draining a week after reclosed and no problems after that.  She has been working from home and then was called back to the office for work.  Increased pain in her ankle and lateral foot with past history of stress fracture.  X-rays on PACS 11/19/2023 showed evidence of likely old ankle sprain with tiny chip avulsion medial lateral but no acute changes.  She is placed in a short cam boot states she is getting better.  She had had a history of stress fracture in the opposite foot.  She is use ibuprofen and Tylenol which has helped.  Diagnosis of PMR with seronegative test for rheumatoid arthritis.  She is currently on Hizentra , Neurontin.  She is on Xarelto , Kevzara and Zepbound.  Review of Systems no chills or fever.  Chronic back  pain symptoms.   Objective: Vital Signs: BP (!) 150/92   Pulse 73   Physical Exam Constitutional:      Appearance: She is well-developed.  HENT:     Head: Normocephalic.     Right Ear: External ear normal.     Left Ear: External ear normal. There is no impacted cerumen.  Eyes:     Pupils: Pupils are equal, round, and reactive to light.  Neck:     Thyroid: No thyromegaly.     Trachea: No tracheal deviation.  Cardiovascular:     Rate and Rhythm: Normal rate.  Pulmonary:     Effort: Pulmonary effort is normal.  Abdominal:     Palpations: Abdomen is soft.  Musculoskeletal:     Cervical back: No rigidity.  Skin:    General: Skin is warm and dry.  Neurological:     Mental Status: She is alert and oriented to person, place, and time.  Psychiatric:        Behavior: Behavior normal.     Ortho Exam patient has some right left ankle effusion slight increased warmth no erythema.  Tenderness laterally over the peroneals minimal tenderness over the posterior tibial subtalar joint is normal.  Anterior tib is strong posterior tib is normal.  Some pain with resisted extension dorsally over the  foot.  Specialty Comments:  CLINICAL DATA:  Trauma and low back pain   EXAM: MRI LUMBAR SPINE WITHOUT AND WITH CONTRAST   TECHNIQUE: Multiplanar and multiecho pulse sequences of the lumbar spine were obtained without and with intravenous contrast.   CONTRAST:  10mL GADAVIST GADOBUTROL 1 MMOL/ML IV SOLN   COMPARISON:  CT 12/30/2022   FINDINGS: Segmentation:  Standard.   Alignment:  Physiologic.   Vertebrae: No fracture, evidence of discitis, or bone lesion. Discogenic endplate edema at Z6-1   Conus medullaris and cauda equina: Conus extends to the L1-2 level. Conus and cauda equina appear normal.   Paraspinal and other soft tissues: Chronic incisional seroma at L4-5; no worrisome features. Generalized atrophy of intrinsic back muscles   Disc levels:   T12- L1: Unremarkable.    L1-L2: Mild spondylosis.   L2-L3: Disc bulging with mild endplate and facet spurring no impingement   L3-L4: Disc narrowing and circumferential bulging. Facet spurring and ligamentum flavum thickening superimposed on congenitally narrow canal from short pedicles. Moderate and progressed spinal stenosis. The foramina remain patent   L4-L5: Disc collapse and endplate degeneration with circumferential bulging. Degenerative facet spurring on both sides with ligamentum flavum thickening. Congenitally narrow spinal canal. Despite prior decompression there is advanced thecal sac stenosis. Moderate left foraminal narrowing   L5-S1:Bulging disc with left foraminal protrusion impinging on the left S1 nerve root. Facet and ligamentous overgrowth causes moderate right subarticular recess narrowing. Degenerative facet spurring on both sides.   IMPRESSION: 1. No acute finding. 2. Lumbar spine degeneration since 07/12/2022 with thecal sac stenosis that is advanced at L4-5 and moderate at L3-4. 3. Bilateral subarticular recess stenosis at L5-S1.     Electronically Signed   By: Tiburcio Pea M.D.   On: 05/17/2023 06:55  Imaging: No results found.   PMFS History: Patient Active Problem List   Diagnosis Date Noted   Pain in left ankle and joints of left foot 11/24/2023   Candida esophagitis (HCC) 11/02/2023   Exposure to the flu 11/02/2023   Encounter for general adult medical examination with abnormal findings 03/17/2023   Recurrent fracture of lumbar vertebra (HCC) 03/01/2023   History of lumbar laminectomy for spinal cord decompression 02/27/2023   Seronegative rheumatoid arthritis (HCC) 11/17/2022   Nocturnal hypoxemia 08/29/2022   Pulmonary nodule 08/16/2022   Recurrent herniation of lumbar disc 07/25/2022   OSA on CPAP 05/19/2022   Impingement syndrome of left shoulder 05/13/2022   Leukocytosis 01/20/2022   Morbid obesity (HCC) 01/01/2022   Chronic fatigue and immune  dysfunction syndrome (HCC) 01/01/2022   High risk medication use 12/02/2021   PMR (polymyalgia rheumatica) (HCC) 11/05/2021   Status post lumbar spine operative procedure for decompression of spinal cord 09/27/2021   Insomnia 05/28/2021   COVID-19 05/07/2021   Malignant (primary) neoplasm, unspecified (HCC) 03/08/2021   Personal history of urinary (tract) infections 03/08/2021   Personal history of pneumonia (recurrent) 03/08/2021   Elevated serum hCG 07/02/2020   POTS (postural orthostatic tachycardia syndrome) 03/08/2020   Mixed hyperlipidemia 03/21/2019   Paroxysmal atrial fibrillation (HCC)    Localized swelling of left lower extremity    Essential hypertension    Mitral valve regurgitation 06/22/2017   CVID (common variable immunodeficiency) (HCC) 05/07/2015   Prediabetes 01/20/2013   History of non-Hodgkin's lymphoma 12/21/2012   Past Medical History:  Diagnosis Date   Abscess    Arthritis    Asthma    November 2023- hospitalized   Atrial fibrillation [facility])    Cardiac  arrhythmia due to congenital heart disease    trace MR 08/15/20 echo ([place])- pt denies congenital heart disease   CVID (common variable immunodeficiency) (HCC) 2015   takes Hizentra   Diverticulosis    Elevated cholesterol    GERD (gastroesophageal reflux disease)    Headache    High blood pressure    Non Hodgkin's lymphoma (HCC) 1998   Obesity    Personal history of gestational diabetes    PMR (polymyalgia rheumatica) (HCC)    takes Kevzara   Pneumonia    PONV (postoperative nausea and vomiting)    POTS (postural orthostatic tachycardia syndrome)    Pre-diabetes    Sleep apnea    CPAP with 2L O2   UTI (urinary tract infection)     Family History  Problem Relation Age of Onset   Atrial fibrillation Mother    Colon cancer Father    Cancer Father        Colon   Colon cancer Sister    Basal cell carcinoma Brother    Atrial fibrillation Maternal Uncle    Atrial fibrillation  Maternal Grandmother    Atrial fibrillation Maternal Grandfather    Heart attack Maternal Grandfather    Ovarian cancer Neg Hx    Stomach cancer Neg Hx    Rectal cancer Neg Hx     Past Surgical History:  Procedure Laterality Date   ABDOMINAL HYSTERECTOMY     APPENDECTOMY     CESAREAN SECTION     CHOLECYSTECTOMY     COLONOSCOPY     HEMATOMA EVACUATION N/A 03/04/2023   Procedure: EVACUATION HEMATOMA SEROMA WITH RECLOSURE LUMBAR INCISION;  Surgeon: [name], MD;  Location: [facility] OR;  Service: Orthopedics;  Laterality: N/A;   JOINT REPLACEMENT Left    hip   JOINT REPLACEMENT Right    Right Hip 2022   LAPAROSCOPIC GASTRIC SLEEVE RESECTION  04/29/2022   left shoulder repair     rotator cuff repair   LUMBAR LAMINECTOMY N/A 09/18/2021   Procedure: RIGHT LUMBAR FOUR-FIVE MICRODISCECTOMY;  Surgeon: [name], MD;  Location: [facility] OR;  Service: Orthopedics;  Laterality: N/A;   LUMBAR LAMINECTOMY/DECOMPRESSION MICRODISCECTOMY N/A 02/27/2023   Procedure: L4-5 LUMBAR DECOMPRESSION, MICRODISCECTOMY;  Surgeon: [name], MD;  Location: [facility] OR;  Service: Orthopedics;  Laterality: N/A;   LUNG REMOVAL, PARTIAL Right 2009   VATS, wedge resection partial lobectomy, done at [facility]   myringostomy Bilateral    Myringotomy   SPLENECTOMY, TOTAL  1998   TONSILLECTOMY AND ADENOIDECTOMY     UPPER GI ENDOSCOPY N/A 04/29/2022   Procedure: UPPER GI ENDOSCOPY;  Surgeon: Kinsinger, De Blanch, MD;  Location: [facility] ORS;  Service: General;  Laterality: N/A;   Social History   Occupational History   Occupation: rn  Tobacco Use   Smoking status: Former    Current packs/day: 0.00    Average packs/day: 0.5 packs/day for 20.0 years (10.0 ttl pk-yrs)    Types: Cigarettes    Start date: 09/29/1982    Quit date: 09/29/2002    Years since quitting: 21.1    Passive exposure: Current   Smokeless tobacco: Never  Vaping Use   Vaping status: Never Used  Substance and Sexual Activity   Alcohol use: Yes     Comment: once a month- rarely   Drug use: No   Sexual activity: Yes    Birth control/protection: Surgical, Post-menopausal    Comment: 1st intercourse 58 yo-5 partners

## 2023-11-27 DIAGNOSIS — J189 Pneumonia, unspecified organism: Secondary | ICD-10-CM | POA: Diagnosis not present

## 2023-11-27 DIAGNOSIS — G4733 Obstructive sleep apnea (adult) (pediatric): Secondary | ICD-10-CM | POA: Diagnosis not present

## 2023-12-03 ENCOUNTER — Encounter (HOSPITAL_COMMUNITY): Payer: Self-pay | Admitting: *Deleted

## 2023-12-06 DIAGNOSIS — D801 Nonfamilial hypogammaglobulinemia: Secondary | ICD-10-CM | POA: Diagnosis not present

## 2023-12-07 ENCOUNTER — Other Ambulatory Visit (HOSPITAL_COMMUNITY): Payer: Self-pay

## 2023-12-07 ENCOUNTER — Other Ambulatory Visit: Payer: Self-pay

## 2023-12-07 ENCOUNTER — Other Ambulatory Visit: Payer: Self-pay | Admitting: Internal Medicine

## 2023-12-07 DIAGNOSIS — M353 Polymyalgia rheumatica: Secondary | ICD-10-CM

## 2023-12-07 DIAGNOSIS — Z79899 Other long term (current) drug therapy: Secondary | ICD-10-CM

## 2023-12-07 MED ORDER — KEVZARA 200 MG/1.14ML ~~LOC~~ SOAJ
200.0000 mg | SUBCUTANEOUS | 2 refills | Status: DC
  Filled 2023-12-07 – 2023-12-17 (×2): qty 2.28, 28d supply, fill #0
  Filled 2024-01-20 (×2): qty 2.28, 28d supply, fill #1
  Filled 2024-02-16: qty 2.28, 28d supply, fill #2

## 2023-12-07 MED ORDER — NEBIVOLOL HCL 5 MG PO TABS
5.0000 mg | ORAL_TABLET | Freq: Every day | ORAL | 4 refills | Status: DC
  Filled 2023-12-07: qty 90, 90d supply, fill #0
  Filled 2024-03-09: qty 60, 60d supply, fill #1

## 2023-12-07 MED ORDER — FLECAINIDE ACETATE 100 MG PO TABS
100.0000 mg | ORAL_TABLET | Freq: Two times a day (BID) | ORAL | 1 refills | Status: AC
  Filled 2023-12-07: qty 180, 90d supply, fill #0
  Filled 2024-06-27: qty 180, 90d supply, fill #1

## 2023-12-07 NOTE — Telephone Encounter (Signed)
 Last Fill: 08/03/2023  Labs: 09/14/2023 MPV 12.9 Absolute Lymphocytes 4,418  TB Gold: 09/14/2023  Negative  Next Visit: 01/11/2024  Last Visit: 10/13/2023  DX:PMR (polymyalgia rheumatica) (HCC)   Current Dose per office note 10/13/2023: Kevzara 200 mg subcu q. 14 days.   Okay to refill [name]?

## 2023-12-08 DIAGNOSIS — D801 Nonfamilial hypogammaglobulinemia: Secondary | ICD-10-CM | POA: Diagnosis not present

## 2023-12-08 DIAGNOSIS — Z7901 Long term (current) use of anticoagulants: Secondary | ICD-10-CM | POA: Diagnosis not present

## 2023-12-08 DIAGNOSIS — Z888 Allergy status to other drugs, medicaments and biological substances status: Secondary | ICD-10-CM | POA: Diagnosis not present

## 2023-12-08 DIAGNOSIS — I10 Essential (primary) hypertension: Secondary | ICD-10-CM | POA: Diagnosis not present

## 2023-12-08 DIAGNOSIS — Z79891 Long term (current) use of opiate analgesic: Secondary | ICD-10-CM | POA: Diagnosis not present

## 2023-12-09 DIAGNOSIS — G4733 Obstructive sleep apnea (adult) (pediatric): Secondary | ICD-10-CM | POA: Diagnosis not present

## 2023-12-15 DIAGNOSIS — D801 Nonfamilial hypogammaglobulinemia: Secondary | ICD-10-CM | POA: Diagnosis not present

## 2023-12-17 ENCOUNTER — Other Ambulatory Visit (HOSPITAL_COMMUNITY): Payer: Self-pay

## 2023-12-17 ENCOUNTER — Other Ambulatory Visit: Payer: Self-pay

## 2023-12-17 DIAGNOSIS — Z7901 Long term (current) use of anticoagulants: Secondary | ICD-10-CM | POA: Diagnosis not present

## 2023-12-17 DIAGNOSIS — R490 Dysphonia: Secondary | ICD-10-CM | POA: Diagnosis not present

## 2023-12-17 DIAGNOSIS — Z79891 Long term (current) use of opiate analgesic: Secondary | ICD-10-CM | POA: Diagnosis not present

## 2023-12-17 DIAGNOSIS — Z888 Allergy status to other drugs, medicaments and biological substances status: Secondary | ICD-10-CM | POA: Diagnosis not present

## 2023-12-17 DIAGNOSIS — I10 Essential (primary) hypertension: Secondary | ICD-10-CM | POA: Diagnosis not present

## 2023-12-17 DIAGNOSIS — R590 Localized enlarged lymph nodes: Secondary | ICD-10-CM | POA: Diagnosis not present

## 2023-12-17 DIAGNOSIS — D801 Nonfamilial hypogammaglobulinemia: Secondary | ICD-10-CM | POA: Diagnosis not present

## 2023-12-17 NOTE — Progress Notes (Signed)
 Specialty Pharmacy Refill Coordination Note  Monique Clark is a 58 y.o. female contacted today regarding refills of specialty medication(s) Sarilumab [name])   Patient requested Delivery   Delivery date: 12/23/23   Verified address: [address]   Medication will be filled on 03.25.25.

## 2023-12-20 DIAGNOSIS — D801 Nonfamilial hypogammaglobulinemia: Secondary | ICD-10-CM | POA: Diagnosis not present

## 2023-12-22 ENCOUNTER — Ambulatory Visit: Payer: Commercial Managed Care - PPO | Admitting: Orthopaedic Surgery

## 2023-12-22 ENCOUNTER — Other Ambulatory Visit: Payer: Self-pay

## 2023-12-22 ENCOUNTER — Telehealth: Payer: Self-pay

## 2023-12-22 NOTE — Telephone Encounter (Signed)
 Received notification from [facility] regarding a prior authorization for [facility]. Authorization has been APPROVED from 12/22/2023 to 12/20/2024. Approval letter sent to scan center.  Patient must continue to fill through [facility]: [id number]   Authorization # [id number]  Chesley Mires, PharmD, MPH, BCPS, CPP Clinical Pharmacist (Rheumatology and Pulmonology)

## 2023-12-22 NOTE — Telephone Encounter (Signed)
 Received notification from [facility] pharmacy that patient requires a new authorization for their medication.  Submitted an URGENT Prior Authorization request to [facility] for [facility] via CoverMyMeds. Will update once we receive a response.  Key: [id number]

## 2023-12-23 ENCOUNTER — Other Ambulatory Visit: Payer: Self-pay

## 2023-12-25 DIAGNOSIS — G4733 Obstructive sleep apnea (adult) (pediatric): Secondary | ICD-10-CM | POA: Diagnosis not present

## 2023-12-25 DIAGNOSIS — J189 Pneumonia, unspecified organism: Secondary | ICD-10-CM | POA: Diagnosis not present

## 2023-12-26 ENCOUNTER — Other Ambulatory Visit (HOSPITAL_COMMUNITY): Payer: Self-pay

## 2024-01-04 DIAGNOSIS — Z79891 Long term (current) use of opiate analgesic: Secondary | ICD-10-CM | POA: Diagnosis not present

## 2024-01-04 DIAGNOSIS — Z888 Allergy status to other drugs, medicaments and biological substances status: Secondary | ICD-10-CM | POA: Diagnosis not present

## 2024-01-04 DIAGNOSIS — R590 Localized enlarged lymph nodes: Secondary | ICD-10-CM | POA: Diagnosis not present

## 2024-01-04 DIAGNOSIS — I10 Essential (primary) hypertension: Secondary | ICD-10-CM | POA: Diagnosis not present

## 2024-01-04 DIAGNOSIS — Z7901 Long term (current) use of anticoagulants: Secondary | ICD-10-CM | POA: Diagnosis not present

## 2024-01-04 DIAGNOSIS — D801 Nonfamilial hypogammaglobulinemia: Secondary | ICD-10-CM | POA: Diagnosis not present

## 2024-01-11 ENCOUNTER — Ambulatory Visit: Payer: Commercial Managed Care - PPO | Admitting: Internal Medicine

## 2024-01-13 ENCOUNTER — Telehealth: Payer: Self-pay | Admitting: Pharmacist

## 2024-01-13 NOTE — Telephone Encounter (Signed)
 Patient left VM regarding CT? Unable to clearly hear her concern. ATC patient to f/u but unable to reach. Left VM requesitng return call if assistance still needed. Requested she transfer to nursing team since I'll be OOO  Geraldene Kleine, PharmD, MPH, BCPS, CPP Clinical Pharmacist (Rheumatology and Pulmonology)

## 2024-01-14 ENCOUNTER — Telehealth: Payer: Self-pay | Admitting: *Deleted

## 2024-01-14 NOTE — Telephone Encounter (Signed)
 Patient contacted the office and left message asking if she should continue her Kevzara. Patient states she has cervical lymphadenopathy. Patient states she has a repeat CT in one month. Please advise.

## 2024-01-17 DIAGNOSIS — D801 Nonfamilial hypogammaglobulinemia: Secondary | ICD-10-CM | POA: Diagnosis not present

## 2024-01-18 DIAGNOSIS — Z888 Allergy status to other drugs, medicaments and biological substances status: Secondary | ICD-10-CM | POA: Diagnosis not present

## 2024-01-18 DIAGNOSIS — D801 Nonfamilial hypogammaglobulinemia: Secondary | ICD-10-CM | POA: Diagnosis not present

## 2024-01-18 DIAGNOSIS — Z7901 Long term (current) use of anticoagulants: Secondary | ICD-10-CM | POA: Diagnosis not present

## 2024-01-18 DIAGNOSIS — Z79891 Long term (current) use of opiate analgesic: Secondary | ICD-10-CM | POA: Diagnosis not present

## 2024-01-18 DIAGNOSIS — I10 Essential (primary) hypertension: Secondary | ICD-10-CM | POA: Diagnosis not present

## 2024-01-19 ENCOUNTER — Other Ambulatory Visit: Payer: Self-pay

## 2024-01-20 ENCOUNTER — Other Ambulatory Visit (HOSPITAL_COMMUNITY): Payer: Self-pay

## 2024-01-20 ENCOUNTER — Other Ambulatory Visit: Payer: Self-pay

## 2024-01-20 NOTE — Progress Notes (Signed)
 Specialty Pharmacy Refill Coordination Note  Monique Clark is a 58 y.o. female contacted today regarding refills of specialty medication(s) Kevzara .  Patient requested (Patient-Rptd) Delivery   Delivery date: (Patient-Rptd) 02/01/24   Verified address: (Patient-Rptd) [address] [place], [place] [address]   Medication will be filled on 01/28/24. New delivery date is 01/29/24. Patient has been notified.

## 2024-01-22 ENCOUNTER — Other Ambulatory Visit: Payer: Self-pay | Admitting: Orthopedic Surgery

## 2024-01-22 NOTE — Progress Notes (Signed)
 Office Visit Note  Patient: Monique Clark             Date of Birth: August 27, 1966           MRN: [id number]             PCP: Adelia Homestead, MD Referring: Adelia Homestead, * Visit Date: 02/03/2024   Subjective:  Follow-up (Patient states she is hipefully going to pain management. Patient states she had a period of time where she did not take the Kevzara  because she was diagnosed with Cervical Lymphadenopathy. )   Discussed the use of AI scribe software for clinical note transcription with the patient, who gave verbal consent to proceed.  History of Present Illness   Monique Clark is a 58 y.o. female here for follow up for PMR on KEVZARA  200mg  subcut every 14 days.    Cervical lymphadenopathy has been present since January, accompanied by hoarseness and left-sided pain. Clark ultrasound and CT scan revealed the largest lymph node measuring 1.2 cm. She has a history of non-Hodgkin lymphoma diagnosed 25 years ago, for which she underwent a splenectomy and RTX treatment.  She has hypogammaglobulinemia, which led to several infections last year, including a UTI, upper respiratory infections, flu, and COVID-19. This was while off her HIzentra  injections. Since resuming, her immunoglobulin levels have since stabilized, and she has not experienced further infections this year. She administers Hizentra  weekly on Sundays.  She is on Kevzara , which she paused for a month in January-February due to concerns about its effects on her lymphadenopathy. During this period, she experienced increased swelling on the left side, joint pain, and body pain. She resumed Kevzara  two weeks ago and takes it weekly on Wednesdays.  She experienced oral thrush, treated with a five-day course of Diflucan , and consulted [facility] who noted possible edema. She also reports nystagmus and increased morning stiffness. No recent yeast rashes, but she has had them in the past.  She mentions a recent ablation  fracture in her foot, leading to stiffness and soreness in her ankle. She was in a boot for a month. She uses a scooter at work to aid mobility.    Previous HPI 10/13/2023 Monique Clark is a 58 y.o. female here for follow up for PMR on KEVZARA  200mg  subcut every 14 days.  They report a gastrointestinal upset followed by [facility] upper respiratory infection, and most recently, symptoms suggestive of a urinary tract infection (UTI) that began last Saturday. The UTI symptoms, including a burning sensation during urination, have slightly improved with home remedies and over-the-counter Azo.   In the context of these illnesses, the patient chose to skip their last dose of Kevzara , which was due two weeks ago. They also report a sore throat and neck pain, with a sensation of swelling on one side. Despite these issues, they note that their throat hoarseness is improving.   Prior to these recent illnesses, the patient had been experiencing joint pain, which had not significantly worsened. They had a short course of steroids during their recent illness, which seemed to alleviate some of their symptoms. However, they now report new back pain, which they suspect may be related to the potential UTI. They also note occasional numbness in the lower back region.   The patient also mentions increased difficulty with long-distance walking, necessitating the use of a scooter for such instances. They report some swelling in their left foot, but deny any significant edema. They also report a recent scratch on  their elbow, which has been causing some discomfort.         Previous HPI 06/24/2023 Monique Clark is a 58 y.o. female here for follow up for PMR on KEVZARA  200mg  subcut every 14 days.  She feels symptoms are overall doing better on the medicine consistently but has some swelling and stiffness worse in the left arm. Particularly shoulder and elbow hurts worse with use but also achy at night. Low back pain is  worse, had updated MRI on 8/18 with progression of degenerative changes. Following up with orthopedics for this, recommending possible fusion surgery but BMI is not appropriate.   Previous HPI 03/24/2023 Monique Clark is a 58 y.o. female here for follow up for PMR on KEVZARA  200mg  subcut every 14 days.  She started the Kevzara  in March and reports improvement in joint pain and stiffness.  She was able to completely discontinue prednisone  without severe exacerbation of joint pain and weakness.  Her treatment was interrupted for several weeks due to going for her lumbar laminectomy at L4-L5 admitted for this May 31.  This was mildly complicated by requiring evacuation of associated hematoma seroma before discharge home.  Since then is proceeding well still has soreness in the area stitches still in place.  She since resumed Kevzara  so far only 1 dose but notes seeing improvement in joint stiffness starting again within days.   Previous HPI 11/17/2022 Monique Clark is a 58 y.o. female here for follow up for PMR on prednisone  10 mg daily.  May recheck labs previously in January getting back onto this medication dose still showing very highly elevated serum inflammatory markers.  Symptoms are not too bad while on medication but she has not tolerated tapering.  In the number of infectious issues related to URI but currently this has cleared up as well.     Previous HPI 09/18/22 Monique Clark is a 58 y.o. female here for follow up for PMR on prednisone  10 mg daily. Since the past visit she had a hospital admission for hypoxia suspected as viral URI trigger of asthma exacerbation. She stopped methotrexate  at that time and has been holding off resuming the medication concern about contributing to infection.  Currently is partly through a course of Augmentin for the persistent upper respiratory infection.  She had lumbar spine injection for ongoing back pain without a great relief of symptoms this time.    Previous HPI 05/20/22 Monique Clark is a 58 y.o. female here for follow up for PMR on methotrexate  15 mg p.o. weekly.  Since her last visit she underwent gastric sleeve resection surgery for weight reduction.  This has still been in the recovery process.  She discontinued the prednisone  perioperatively and has been off this.  She notices multiple symptoms are worse with joint pain and swelling especially in shoulders and elbows.  Not as much lower extremity swelling.   Previous HPI 01/06/2022 Monique Clark is a 58 y.o. female here for follow up for PMR on prednisone  10 mg daily and started methotrexate  10 mg PO weekly last month due to persistent inflammatory lab elevations.  She continues having fatigue is persistent.  Shortness of breath with walking and exertion is slightly improved.  She has not noticed any side effects or intolerance to methotrexate .  She is continuing to try to work on her exercise as a part of preparation for bariatric surgery also awaiting upcoming cardiac evaluation.     Previous HPI 12/02/21 Monique Clark is a 58 y.o. female here for  muscle pain and weakness in shoulders and legs concern for history of PMR previously treated with long term prednisone .  She has a history of CVID on IVIG, non hodgkins lymphoma, also has history of bilateral hip replacement. Recently had microdiscectomy about 6 weeks prior. Started prednisone  40 mg daily last month decreased to 20 mg daily and now is down to 10 mg dose. She felt her symptoms improve about 80-90% when starting prednisone  again and tapering has been okay, only slight increase with this dose reduction. She does not see any particular swelling, discoloration, or rashes. She has noticed some hand tremor more when using her hands that is not typical for her.   Review of Systems  Constitutional:  Positive for fatigue.  HENT:  Positive for mouth dryness. Negative for mouth sores.   Eyes:  Positive for dryness.  Respiratory:   Negative for shortness of breath.   Cardiovascular:  Positive for palpitations. Negative for chest pain.  Gastrointestinal:  Positive for constipation. Negative for blood in stool and diarrhea.  Endocrine: Negative for increased urination.  Genitourinary:  Negative for involuntary urination.  Musculoskeletal:  Positive for joint pain, gait problem, joint pain, joint swelling, myalgias, muscle weakness, morning stiffness, muscle tenderness and myalgias.  Skin:  Negative for color change, rash, hair loss and sensitivity to sunlight.  Allergic/Immunologic: Positive for susceptible to infections.  Neurological:  Positive for headaches. Negative for dizziness.  Hematological:  Positive for swollen glands.  Psychiatric/Behavioral:  Positive for depressed mood. Negative for sleep disturbance. The patient is not nervous/anxious.     PMFS History:  Patient Active Problem List   Diagnosis Date Noted   Pain in left ankle and joints of left foot 11/24/2023   Candida esophagitis (HCC) 11/02/2023   Exposure to the flu 11/02/2023   Encounter for general adult medical examination with abnormal findings 03/17/2023   Recurrent fracture of lumbar vertebra (HCC) 03/01/2023   History of lumbar laminectomy for spinal cord decompression 02/27/2023   Seronegative rheumatoid arthritis (HCC) 11/17/2022   Nocturnal hypoxemia 08/29/2022   Pulmonary nodule 08/16/2022   Recurrent herniation of lumbar disc 07/25/2022   OSA on CPAP 05/19/2022   Impingement syndrome of left shoulder 05/13/2022   Leukocytosis 01/20/2022   Morbid obesity (HCC) 01/01/2022   Chronic fatigue and immune dysfunction syndrome (HCC) 01/01/2022   High risk medication use 12/02/2021   PMR (polymyalgia rheumatica) (HCC) 11/05/2021   Status post lumbar spine operative procedure for decompression of spinal cord 09/27/2021   Insomnia 05/28/2021   COVID-19 05/07/2021   Malignant (primary) neoplasm, unspecified (HCC) 03/08/2021   Personal  history of urinary (tract) infections 03/08/2021   Personal history of pneumonia (recurrent) 03/08/2021   Elevated serum hCG 07/02/2020   POTS (postural orthostatic tachycardia syndrome) 03/08/2020   Mixed hyperlipidemia 03/21/2019   Paroxysmal atrial fibrillation (HCC)    Localized swelling of left lower extremity    Essential hypertension    Mitral valve regurgitation 06/22/2017   CVID (common variable immunodeficiency) (HCC) 05/07/2015   Prediabetes 01/20/2013   History of non-Hodgkin's lymphoma 12/21/2012    Past Medical History:  Diagnosis Date   Abscess    Arthritis    Asthma    November 2023- hospitalized   Atrial fibrillation [facility])    Cardiac arrhythmia due to congenital heart disease    trace MR 08/15/20 echo ([place])- pt denies congenital heart disease   Cervical lymphadenopathy    CVID (common variable immunodeficiency) (HCC) 2015   takes Hizentra    Diverticulosis  Elevated cholesterol    GERD (gastroesophageal reflux disease)    Headache    High blood pressure    Non Hodgkin's lymphoma (HCC) 1998   Obesity    Personal history of gestational diabetes    PMR (polymyalgia rheumatica) (HCC)    takes Kevzara    Pneumonia    PONV (postoperative nausea and vomiting)    POTS (postural orthostatic tachycardia syndrome)    Pre-diabetes    Sleep apnea    CPAP with 2L O2   UTI (urinary tract infection)     Family History  Problem Relation Age of Onset   Atrial fibrillation Mother    Colon cancer Father    Cancer Father        Colon   Colon cancer Sister    Basal cell carcinoma Brother    Atrial fibrillation Maternal Uncle    Atrial fibrillation Maternal Grandmother    Atrial fibrillation Maternal Grandfather    Heart attack Maternal Grandfather    Ovarian cancer Neg Hx    Stomach cancer Neg Hx    Rectal cancer Neg Hx    Past Surgical History:  Procedure Laterality Date   ABDOMINAL HYSTERECTOMY     APPENDECTOMY     CESAREAN SECTION      CHOLECYSTECTOMY     COLONOSCOPY     HEMATOMA EVACUATION N/A 03/04/2023   Procedure: EVACUATION HEMATOMA SEROMA WITH RECLOSURE LUMBAR INCISION;  Surgeon: Adah Acron, MD;  Location: [facility] OR;  Service: Orthopedics;  Laterality: N/A;   JOINT REPLACEMENT Left    hip   JOINT REPLACEMENT Right    Right Hip 2022   LAPAROSCOPIC GASTRIC SLEEVE RESECTION  04/29/2022   left shoulder repair     rotator cuff repair   LUMBAR LAMINECTOMY N/A 09/18/2021   Procedure: RIGHT LUMBAR FOUR-FIVE MICRODISCECTOMY;  Surgeon: Adah Acron, MD;  Location: [facility] OR;  Service: Orthopedics;  Laterality: N/A;   LUMBAR LAMINECTOMY/DECOMPRESSION MICRODISCECTOMY N/A 02/27/2023   Procedure: L4-5 LUMBAR DECOMPRESSION, MICRODISCECTOMY;  Surgeon: Adah Acron, MD;  Location: [facility] OR;  Service: Orthopedics;  Laterality: N/A;   LUNG REMOVAL, PARTIAL Right 2009   VATS, wedge resection partial lobectomy, done at [facility]   myringostomy Bilateral    Myringotomy   SPLENECTOMY, TOTAL  1998   TONSILLECTOMY AND ADENOIDECTOMY     UPPER GI ENDOSCOPY N/A 04/29/2022   Procedure: UPPER GI ENDOSCOPY;  Surgeon: Kinsinger, Alphonso Aschoff, MD;  Location: [facility] ORS;  Service: General;  Laterality: N/A;   Social History   Social History Narrative   Not on file   Immunization History  Administered Date(s) Administered   Influenza-Unspecified 07/27/2020, 07/08/2022   PFIZER(Purple Top)SARS-COV-2 Vaccination 09/24/2019, 10/12/2019     Objective: Vital Signs: BP 136/82 (BP Location: Right Arm, Patient Position: Sitting, Cuff Size: Large)   Pulse (!) 56   Resp 16   Ht 5\' 5"  (1.651 m)   Wt (!) 308 lb (139.7 kg)   BMI 51.25 kg/m    Physical Exam Constitutional:      Appearance: She is obese.  HENT:     Mouth/Throat:     Mouth: Mucous membranes are moist.     Pharynx: Oropharynx is clear.  Eyes:     Conjunctiva/sclera: Conjunctivae normal.  Cardiovascular:     Rate and Rhythm: Normal rate and regular rhythm.  Pulmonary:     Effort:  Pulmonary effort is normal.     Breath sounds: Normal breath sounds.  Musculoskeletal:     Right lower leg: No  edema.     Left lower leg: No edema.  Skin:    General: Skin is warm and dry.     Findings: No rash.  Neurological:     Mental Status: She is alert.  Psychiatric:        Mood and Affect: Mood normal.      Musculoskeletal Exam:  Right shoulder tenderness to pressure, guarding against full overhead abduction Left worse than right elbow extension range of motion limited, no focal tenderness or swelling Wrists full ROM no tenderness or swelling Fingers full ROM no tenderness or swelling Bilateral paraspinal tenderness to pressure throughout middle and lower back Hip internal rotation slightly restricted, bilateral lateral tenderness to pressure Knees full ROM no tenderness or swelling   Physical Exam   MUSCULOSKELETAL: Limited range of motion in the elbow. Right hip movement limited. Ankle stiffness and soreness.       Investigation: No additional findings.  Imaging: No results found.  Recent Labs: Lab Results  Component Value Date   WBC 8.4 09/14/2023   HGB 13.4 09/14/2023   PLT 373 09/14/2023   NA 142 09/14/2023   K 4.3 09/14/2023   CL 104 09/14/2023   CO2 30 09/14/2023   GLUCOSE 84 09/14/2023   BUN 15 09/14/2023   CREATININE 0.65 09/14/2023   BILITOT 0.3 09/14/2023   ALKPHOS 70 08/16/2022   AST 26 09/14/2023   ALT 22 09/14/2023   PROT 6.7 09/14/2023   ALBUMIN 3.6 08/16/2022   CALCIUM  9.6 09/14/2023   GFRAA >60 03/18/2020   QFTBGOLDPLUS NEGATIVE 09/14/2023    Speciality Comments: No specialty comments available.  Procedures:  No procedures performed Allergies: Chlorhexidine , Tetanus-diphtheria toxoids td, Nizatidine, Promethazine , and Tetanus toxoids   Assessment / Plan:     Visit Diagnoses: PMR (polymyalgia rheumatica) (HCC) - Plan: Sedimentation rate, C-reactive protein Skipped last Kevzara  dose due to illness increase cervical adenopathy  most concerning due to prior history of NHL.  Described widespread increase in pain and stiffness in multiple areas while off medication.  With series at typical though mostly in her back and legs, has noticed some increase swelling on the left side -Checking ESR and CRP disease activity assessment -Plan to resume Kevzara  this week if infection is ruled out, else after finishing abtx course.  High risk medication use - Kevzara  200 mg subcu q. 14 days. - Plan: CBC with Differential/Platelet, Comprehensive metabolic panel with GFR Was previously having increased rate of infections but was also off of Hizentra  treatment for her hypoglobulinemia.  Since getting back on treatment this has improved.  Not aware of specific association between IL-6 receptor antagonists and lymphoma recurrence risk as I believe she is fine to continue the treatment unless specifically needing to start alternate immunosuppression such as rituximab based on findings. - Checking CBC and CMP for medication monitoring on Kevzara   Cervical lymphadenopathy Persistent since January with largest node at 1.2 cm. Awaiting follow-up CT for changes. - Await follow-up CT on June 2nd we will check back to follow-up or if there is new treatment plan recommendation by oncologist office [place] Blakes)   Orders: Orders Placed This Encounter  Procedures   Sedimentation rate   C-reactive protein   CBC with Differential/Platelet   Comprehensive metabolic panel with GFR   No orders of the defined types were placed in this encounter.    Follow-Up Instructions: Return in about 3 months (around 05/05/2024) for PMR on KEV f/u 3mos.   Matt Song, MD  Note - This record  has been created using [employer].  Chart creation errors have been sought, but may not always  have been located. Such creation errors do not reflect on  the standard of medical care.

## 2024-01-23 ENCOUNTER — Other Ambulatory Visit (HOSPITAL_COMMUNITY): Payer: Self-pay

## 2024-01-23 MED ORDER — METHOCARBAMOL 500 MG PO TABS
500.0000 mg | ORAL_TABLET | Freq: Four times a day (QID) | ORAL | 0 refills | Status: DC | PRN
  Filled 2024-01-23: qty 30, 8d supply, fill #0

## 2024-01-25 DIAGNOSIS — J189 Pneumonia, unspecified organism: Secondary | ICD-10-CM | POA: Diagnosis not present

## 2024-01-25 DIAGNOSIS — G4733 Obstructive sleep apnea (adult) (pediatric): Secondary | ICD-10-CM | POA: Diagnosis not present

## 2024-01-27 ENCOUNTER — Ambulatory Visit: Admitting: Internal Medicine

## 2024-01-27 ENCOUNTER — Encounter: Payer: Self-pay | Admitting: Internal Medicine

## 2024-01-27 VITALS — BP 122/100 | HR 66 | Temp 98.5°F | Ht 65.0 in | Wt 310.0 lb

## 2024-01-27 DIAGNOSIS — M353 Polymyalgia rheumatica: Secondary | ICD-10-CM

## 2024-01-27 DIAGNOSIS — Z8572 Personal history of non-Hodgkin lymphomas: Secondary | ICD-10-CM | POA: Diagnosis not present

## 2024-01-27 DIAGNOSIS — M0600 Rheumatoid arthritis without rheumatoid factor, unspecified site: Secondary | ICD-10-CM

## 2024-01-27 MED ORDER — HYDROCODONE-ACETAMINOPHEN 5-325 MG PO TABS: 1.0000 | ORAL_TABLET | Freq: Four times a day (QID) | ORAL | 0 refills | Status: DC | PRN

## 2024-01-27 NOTE — Patient Instructions (Signed)
 We have sent in hydrocodone  to use for the ankle and will get you in with the pain/rehab clinic to help longer term with this.

## 2024-01-27 NOTE — Assessment & Plan Note (Signed)
 Short term hydrocodone  rx done due to likely arthritis ankle or tendon. Referral to pain management. She is off her kevzara  right now due to cervical lymphadenopathy.

## 2024-01-27 NOTE — Assessment & Plan Note (Signed)
 Needs long term pain management strategy and wants more targeted interventions and medication as last resort. Short term hydrocodone  given today for new ankle pain.

## 2024-01-27 NOTE — Progress Notes (Signed)
   Subjective:   Patient ID: Monique Clark, female    DOB: Jul 17, 1966, 58 y.o.   MRN: [id number]  HPI The patient is a 58 YO female coming in for pain issues and leg swelling. Has left ankle pain now and arthritis from seronegative RA and PMR pain different places. She is not desiring long term pain medication but does want a strategy with pain management for intervention based on pain. She is off her treatment currently due to cervical adenopathy and they did scan and will recheck scan soon and if growing could be recurrent NHL. She is struggling with multiple health concerns.   Review of Systems  Constitutional:  Positive for activity change.  HENT: Negative.    Eyes: Negative.   Respiratory:  Negative for cough, chest tightness and shortness of breath.   Cardiovascular:  Negative for chest pain, palpitations and leg swelling.  Gastrointestinal:  Negative for abdominal distention, abdominal pain, constipation, diarrhea, nausea and vomiting.  Musculoskeletal:  Positive for arthralgias, back pain, gait problem and myalgias.  Skin: Negative.   Psychiatric/Behavioral:  Positive for dysphoric mood.     Objective:  Physical Exam Constitutional:      Appearance: She is well-developed. She is obese.  HENT:     Head: Normocephalic and atraumatic.  Cardiovascular:     Rate and Rhythm: Normal rate and regular rhythm.  Pulmonary:     Effort: Pulmonary effort is normal. No respiratory distress.     Breath sounds: Normal breath sounds. No wheezing or rales.  Abdominal:     General: Bowel sounds are normal. There is no distension.     Palpations: Abdomen is soft.     Tenderness: There is no abdominal tenderness. There is no rebound.  Musculoskeletal:        General: Tenderness present.     Cervical back: Normal range of motion.     Left lower leg: Edema present.  Skin:    General: Skin is warm and dry.  Neurological:     Mental Status: She is alert and oriented to person, place, and  time.     Coordination: Coordination abnormal.     Vitals:   01/27/24 0857 01/27/24 0903  BP: (!) 122/100 (!) 122/100  Pulse: 66   Temp: 98.5 F (36.9 C)   TempSrc: Oral   SpO2: 98%   Weight: (!) 310 lb (140.6 kg)   Height: 5\' 5"  (1.651 m)     Assessment & Plan:

## 2024-01-27 NOTE — Assessment & Plan Note (Signed)
 With new cervical lymphadenopathy which is being closely monitored and she has stopped kevzara  since finding out about this. Getting repeat CT scan soon.

## 2024-01-28 ENCOUNTER — Other Ambulatory Visit: Payer: Self-pay

## 2024-01-31 DIAGNOSIS — D801 Nonfamilial hypogammaglobulinemia: Secondary | ICD-10-CM | POA: Diagnosis not present

## 2024-02-03 ENCOUNTER — Ambulatory Visit: Attending: Internal Medicine | Admitting: Internal Medicine

## 2024-02-03 ENCOUNTER — Encounter: Payer: Self-pay | Admitting: Internal Medicine

## 2024-02-03 VITALS — BP 136/82 | HR 56 | Resp 16 | Ht 65.0 in | Wt 308.0 lb

## 2024-02-03 DIAGNOSIS — M353 Polymyalgia rheumatica: Secondary | ICD-10-CM

## 2024-02-03 DIAGNOSIS — R300 Dysuria: Secondary | ICD-10-CM

## 2024-02-03 DIAGNOSIS — D839 Common variable immunodeficiency, unspecified: Secondary | ICD-10-CM | POA: Diagnosis not present

## 2024-02-03 DIAGNOSIS — Z8572 Personal history of non-Hodgkin lymphomas: Secondary | ICD-10-CM

## 2024-02-03 DIAGNOSIS — Z79899 Other long term (current) drug therapy: Secondary | ICD-10-CM

## 2024-02-04 LAB — C-REACTIVE PROTEIN: CRP: 47.1 mg/L — ABNORMAL HIGH (ref <8.0)

## 2024-02-04 LAB — CBC WITH DIFFERENTIAL/PLATELET
Absolute Lymphocytes: 3039 {cells}/uL (ref 850–3900)
Absolute Monocytes: 1308 {cells}/uL — ABNORMAL HIGH (ref 200–950)
Basophils Absolute: 41 {cells}/uL (ref 0–200)
Basophils Relative: 0.4 %
Eosinophils Absolute: 165 {cells}/uL (ref 15–500)
Eosinophils Relative: 1.6 %
HCT: 39.3 % (ref 35.0–45.0)
Hemoglobin: 12.7 g/dL (ref 11.7–15.5)
MCH: 29.2 pg (ref 27.0–33.0)
MCHC: 32.3 g/dL (ref 32.0–36.0)
MCV: 90.3 fL (ref 80.0–100.0)
MPV: 12.7 fL — ABNORMAL HIGH (ref 7.5–12.5)
Monocytes Relative: 12.7 %
Neutro Abs: 5747 {cells}/uL (ref 1500–7800)
Neutrophils Relative %: 55.8 %
Platelets: 266 10*3/uL (ref 140–400)
RBC: 4.35 10*6/uL (ref 3.80–5.10)
RDW: 12.5 % (ref 11.0–15.0)
Total Lymphocyte: 29.5 %
WBC: 10.3 10*3/uL (ref 3.8–10.8)

## 2024-02-04 LAB — COMPREHENSIVE METABOLIC PANEL WITH GFR
AG Ratio: 1.4 (calc) (ref 1.0–2.5)
ALT: 16 U/L (ref 6–29)
AST: 24 U/L (ref 10–35)
Albumin: 4.1 g/dL (ref 3.6–5.1)
Alkaline phosphatase (APISO): 84 U/L (ref 37–153)
BUN: 17 mg/dL (ref 7–25)
CO2: 30 mmol/L (ref 20–32)
Calcium: 9.8 mg/dL (ref 8.6–10.4)
Chloride: 103 mmol/L (ref 98–110)
Creat: 0.62 mg/dL (ref 0.50–1.03)
Globulin: 3 g/dL (ref 1.9–3.7)
Glucose, Bld: 91 mg/dL (ref 65–99)
Potassium: 4.5 mmol/L (ref 3.5–5.3)
Sodium: 139 mmol/L (ref 135–146)
Total Bilirubin: 0.5 mg/dL (ref 0.2–1.2)
Total Protein: 7.1 g/dL (ref 6.1–8.1)
eGFR: 103 mL/min/{1.73_m2} (ref >=60)

## 2024-02-04 LAB — SEDIMENTATION RATE: Sed Rate: 62 mm/h — ABNORMAL HIGH (ref 0–30)

## 2024-02-07 ENCOUNTER — Encounter: Payer: Self-pay | Admitting: Internal Medicine

## 2024-02-10 MED ORDER — HYDROCODONE-ACETAMINOPHEN 5-325 MG PO TABS: 1.0000 | ORAL_TABLET | Freq: Two times a day (BID) | ORAL | 0 refills | Status: DC | PRN

## 2024-02-12 ENCOUNTER — Encounter: Payer: Self-pay | Admitting: Physical Medicine and Rehabilitation

## 2024-02-16 ENCOUNTER — Other Ambulatory Visit: Payer: Self-pay

## 2024-02-16 ENCOUNTER — Other Ambulatory Visit: Payer: Self-pay | Admitting: Pharmacy Technician

## 2024-02-16 NOTE — Progress Notes (Signed)
 Specialty Pharmacy Refill Coordination Note  Monique Clark is a 58 y.o. female contacted today regarding refills of specialty medication(s) No data recorded  Patient requested (Patient-Rptd) Delivery   Delivery date: (Patient-Rptd) 02/22/24 New Delivery Date 02/24/24   Verified address: (Patient-Rptd) [address] [place], [place] [address]   Medication will be filled on 02/23/27.    Left Voicemail for patient about new shipping date.

## 2024-02-23 ENCOUNTER — Other Ambulatory Visit (HOSPITAL_COMMUNITY): Payer: Self-pay

## 2024-02-23 ENCOUNTER — Other Ambulatory Visit: Payer: Self-pay | Admitting: Orthopedic Surgery

## 2024-02-23 ENCOUNTER — Other Ambulatory Visit: Payer: Self-pay

## 2024-02-23 MED ORDER — METHOCARBAMOL 500 MG PO TABS
500.0000 mg | ORAL_TABLET | Freq: Four times a day (QID) | ORAL | 0 refills | Status: DC | PRN
  Filled 2024-02-23: qty 30, 8d supply, fill #0

## 2024-02-24 DIAGNOSIS — J189 Pneumonia, unspecified organism: Secondary | ICD-10-CM | POA: Diagnosis not present

## 2024-02-24 DIAGNOSIS — D801 Nonfamilial hypogammaglobulinemia: Secondary | ICD-10-CM | POA: Diagnosis not present

## 2024-02-24 DIAGNOSIS — G4733 Obstructive sleep apnea (adult) (pediatric): Secondary | ICD-10-CM | POA: Diagnosis not present

## 2024-02-24 DIAGNOSIS — R590 Localized enlarged lymph nodes: Secondary | ICD-10-CM | POA: Diagnosis not present

## 2024-02-26 ENCOUNTER — Other Ambulatory Visit: Payer: Self-pay | Admitting: Internal Medicine

## 2024-02-28 DIAGNOSIS — D801 Nonfamilial hypogammaglobulinemia: Secondary | ICD-10-CM | POA: Diagnosis not present

## 2024-02-29 DIAGNOSIS — I340 Nonrheumatic mitral (valve) insufficiency: Secondary | ICD-10-CM | POA: Diagnosis not present

## 2024-02-29 DIAGNOSIS — Z6841 Body Mass Index (BMI) 40.0 and over, adult: Secondary | ICD-10-CM | POA: Diagnosis not present

## 2024-02-29 DIAGNOSIS — I10 Essential (primary) hypertension: Secondary | ICD-10-CM | POA: Diagnosis not present

## 2024-02-29 DIAGNOSIS — M069 Rheumatoid arthritis, unspecified: Secondary | ICD-10-CM | POA: Diagnosis not present

## 2024-02-29 DIAGNOSIS — I4891 Unspecified atrial fibrillation: Secondary | ICD-10-CM | POA: Diagnosis not present

## 2024-03-06 ENCOUNTER — Encounter: Payer: Self-pay | Admitting: Internal Medicine

## 2024-03-07 NOTE — Telephone Encounter (Signed)
 Please advise if refill for pain medication is appropriate

## 2024-03-08 DIAGNOSIS — Z6841 Body Mass Index (BMI) 40.0 and over, adult: Secondary | ICD-10-CM | POA: Diagnosis not present

## 2024-03-08 DIAGNOSIS — N62 Hypertrophy of breast: Secondary | ICD-10-CM | POA: Diagnosis not present

## 2024-03-08 DIAGNOSIS — G4733 Obstructive sleep apnea (adult) (pediatric): Secondary | ICD-10-CM | POA: Diagnosis not present

## 2024-03-08 MED ORDER — HYDROCODONE-ACETAMINOPHEN 5-325 MG PO TABS: 1.0000 | ORAL_TABLET | Freq: Two times a day (BID) | ORAL | 0 refills | Status: DC | PRN

## 2024-03-08 NOTE — Telephone Encounter (Signed)
 I have sent in refill for medication. Please have PA team redo PA for zpebound with OSA as she has this.

## 2024-03-09 ENCOUNTER — Other Ambulatory Visit: Payer: Self-pay

## 2024-03-09 ENCOUNTER — Other Ambulatory Visit (HOSPITAL_COMMUNITY): Payer: Self-pay

## 2024-03-09 ENCOUNTER — Other Ambulatory Visit: Payer: Self-pay | Admitting: Internal Medicine

## 2024-03-09 MED ORDER — DULOXETINE HCL 60 MG PO CPEP
60.0000 mg | ORAL_CAPSULE | Freq: Every day | ORAL | 0 refills | Status: DC
Start: 2024-03-09 — End: 2024-05-08
  Filled 2024-03-09: qty 90, 90d supply, fill #0

## 2024-03-13 DIAGNOSIS — D801 Nonfamilial hypogammaglobulinemia: Secondary | ICD-10-CM | POA: Diagnosis not present

## 2024-03-15 ENCOUNTER — Telehealth: Admitting: Physician Assistant

## 2024-03-15 DIAGNOSIS — B372 Candidiasis of skin and nail: Secondary | ICD-10-CM | POA: Diagnosis not present

## 2024-03-15 MED ORDER — NYSTATIN 100000 UNIT/GM EX CREA: 1.0000 | TOPICAL_CREAM | Freq: Two times a day (BID) | CUTANEOUS | 0 refills | Status: AC

## 2024-03-15 MED ORDER — FLUCONAZOLE 150 MG PO TABS: 150.0000 mg | ORAL_TABLET | Freq: Once | ORAL | 0 refills | Status: AC

## 2024-03-15 NOTE — Progress Notes (Signed)
 I have spent 5 minutes in review of e-visit questionnaire, review and updating patient chart, medical decision making and response to patient.   Piedad Climes, PA-C

## 2024-03-15 NOTE — Progress Notes (Signed)
 E Visit for Rash  We are sorry that you are not feeling well. Here is how we plan to help!  Based upon your presentation it appears you have a yeast infection.  I have prescribed: A dose of Diflucan  to take once, as well as a prescription for Nystatin  cream apply to the affected area twice daily. You want to try to keep the skin clean and dry as much as you can.   HOME CARE:  Take an antihistamine like Benadryl  for widespread rashes that itch.  The adult dose of Benadryl  is 25-50 mg by mouth 4 times daily. Caution:  This type of medication may cause sleepiness.  Do not drink alcohol, drive, or operate dangerous machinery while taking antihistamines.  Do not take these medications if you have prostate enlargement.  Read package instructions thoroughly on all medications that you take.  GET HELP RIGHT AWAY IF:  Symptoms don't go away after treatment. Severe itching that persists. If you rash spreads or swells. If you rash begins to smell. If it blisters and opens or develops a yellow-brown crust. You develop a fever. You have a sore throat. You become short of breath.  MAKE SURE YOU:  Understand these instructions. Will watch your condition. Will get help right away if you are not doing well or get worse.  Thank you for choosing an e-visit.  Your e-visit answers were reviewed by a board certified advanced clinical practitioner to complete your personal care plan. Depending upon the condition, your plan could have included both over the counter or prescription medications.  Please review your pharmacy choice. Make sure the pharmacy is open so you can pick up prescription now. If there is a problem, you may contact your provider through [employer] and have the prescription routed to another pharmacy.  Your safety is important to us . If you have drug allergies check your prescription carefully.   For the next 24 hours you can use MyChart to ask questions about today's visit,  request a non-urgent call back, or ask for a work or school excuse. You will get an email in the next two days asking about your experience. I hope that your e-visit has been valuable and will speed your recovery.

## 2024-03-17 ENCOUNTER — Telehealth: Admitting: Family Medicine

## 2024-03-17 DIAGNOSIS — L03115 Cellulitis of right lower limb: Secondary | ICD-10-CM

## 2024-03-17 MED ORDER — SULFAMETHOXAZOLE-TRIMETHOPRIM 800-160 MG PO TABS: 1.0000 | ORAL_TABLET | Freq: Two times a day (BID) | ORAL | 0 refills | Status: AC

## 2024-03-17 NOTE — Progress Notes (Signed)
E Visit for Cellulitis  We are sorry that you are not feeling well. Here is how we plan to help!  Based on what you shared with me it looks like you have cellulitis.  Cellulitis looks like areas of skin redness, swelling, and warmth; it develops as a result of bacteria entering under the skin. Little red spots and/or bleeding can be seen in skin, and tiny surface sacs containing fluid can occur. Fever can be present. Cellulitis is almost always on one side of a body, and the lower limbs are the most common site of involvement.   I have prescribed:  Bactrim DS 1 tablet by mouth twice a day for 7 days  HOME CARE:  Take your medications as ordered and take all of them, even if the skin irritation appears to be healing.   GET HELP RIGHT AWAY IF:  Symptoms that don't begin to go away within 48 hours. Severe redness persists or worsens If the area turns color, spreads or swells. If it blisters and opens, develops yellow-brown crust or bleeds. You develop a fever or chills. If the pain increases or becomes unbearable.  Are unable to keep fluids and food down.  MAKE SURE YOU   Understand these instructions. Will watch your condition. Will get help right away if you are not doing well or get worse.  Thank you for choosing an e-visit.  Your e-visit answers were reviewed by a board certified advanced clinical practitioner to complete your personal care plan. Depending upon the condition, your plan could have included both over the counter or prescription medications.  Please review your pharmacy choice. Make sure the pharmacy is open so you can pick up prescription now. If there is a problem, you may contact your provider through MyChart messaging and have the prescription routed to another pharmacy.  Your safety is important to us. If you have drug allergies check your prescription carefully.   For the next 24 hours you can use MyChart to ask questions about today's visit, request a  non-urgent call back, or ask for a work or school excuse. You will get an email in the next two days asking about your experience. I hope that your e-visit has been valuable and will speed your recovery.  I provided 5 minutes of non face-to-face time during this encounter for chart review, medication and order placement, as well as and documentation.   

## 2024-03-21 ENCOUNTER — Other Ambulatory Visit: Payer: Self-pay | Admitting: Internal Medicine

## 2024-03-21 ENCOUNTER — Other Ambulatory Visit: Payer: Self-pay

## 2024-03-21 DIAGNOSIS — Z79899 Other long term (current) drug therapy: Secondary | ICD-10-CM

## 2024-03-21 DIAGNOSIS — M353 Polymyalgia rheumatica: Secondary | ICD-10-CM

## 2024-03-21 MED ORDER — KEVZARA 200 MG/1.14ML ~~LOC~~ SOAJ
200.0000 mg | SUBCUTANEOUS | 2 refills | Status: DC
  Filled 2024-03-21 – 2024-03-24 (×2): qty 2.28, 28d supply, fill #0
  Filled 2024-04-18 – 2024-05-16 (×2): qty 2.28, 28d supply, fill #1
  Filled 2024-06-09: qty 2.28, 28d supply, fill #2

## 2024-03-21 NOTE — Telephone Encounter (Signed)
 Last Fill: 12/07/2023  Labs: 02/03/2024 MPV 12.7, Absolute Monocytes 1,308  TB Gold: 09/14/2023 Neg    Next Visit: 05/09/2024  Last Visit: 02/03/2024  DX: PMR    Current Dose per office note 02/03/2024: Kevzara  200 mg subcu q. 14 days   Okay to refill Kevzara ?

## 2024-03-23 ENCOUNTER — Other Ambulatory Visit (HOSPITAL_COMMUNITY): Payer: Self-pay

## 2024-03-24 ENCOUNTER — Other Ambulatory Visit: Payer: Self-pay

## 2024-03-24 ENCOUNTER — Encounter (INDEPENDENT_AMBULATORY_CARE_PROVIDER_SITE_OTHER): Payer: Self-pay

## 2024-03-24 ENCOUNTER — Other Ambulatory Visit (HOSPITAL_COMMUNITY): Payer: Self-pay

## 2024-03-24 NOTE — Progress Notes (Signed)
 Specialty Pharmacy Refill Coordination Note  Monique Clark is a 58 y.o. female contacted today regarding refills of specialty medication(s) Sarilumab  (Kevzara )   Patient requested Delivery   Delivery date: 03/29/24   Verified address: [address] [place], [place] [address]   Medication will be filled on 03/28/24.

## 2024-03-26 DIAGNOSIS — J189 Pneumonia, unspecified organism: Secondary | ICD-10-CM | POA: Diagnosis not present

## 2024-03-26 DIAGNOSIS — G4733 Obstructive sleep apnea (adult) (pediatric): Secondary | ICD-10-CM | POA: Diagnosis not present

## 2024-03-28 ENCOUNTER — Encounter: Attending: Physical Medicine and Rehabilitation | Admitting: Physical Medicine and Rehabilitation

## 2024-03-28 ENCOUNTER — Encounter: Payer: Self-pay | Admitting: Physical Medicine and Rehabilitation

## 2024-03-28 ENCOUNTER — Other Ambulatory Visit: Payer: Self-pay

## 2024-03-28 VITALS — BP 115/73 | HR 60 | Ht 65.0 in | Wt 308.8 lb

## 2024-03-28 DIAGNOSIS — M0600 Rheumatoid arthritis without rheumatoid factor, unspecified site: Secondary | ICD-10-CM | POA: Diagnosis present

## 2024-03-28 DIAGNOSIS — M5442 Lumbago with sciatica, left side: Secondary | ICD-10-CM | POA: Insufficient documentation

## 2024-03-28 DIAGNOSIS — G8929 Other chronic pain: Secondary | ICD-10-CM | POA: Diagnosis present

## 2024-03-28 DIAGNOSIS — Z79899 Other long term (current) drug therapy: Secondary | ICD-10-CM | POA: Insufficient documentation

## 2024-03-28 DIAGNOSIS — M25572 Pain in left ankle and joints of left foot: Secondary | ICD-10-CM | POA: Diagnosis present

## 2024-03-28 DIAGNOSIS — Z029 Encounter for administrative examinations, unspecified: Secondary | ICD-10-CM | POA: Insufficient documentation

## 2024-03-28 DIAGNOSIS — M353 Polymyalgia rheumatica: Secondary | ICD-10-CM | POA: Diagnosis not present

## 2024-03-28 DIAGNOSIS — G894 Chronic pain syndrome: Secondary | ICD-10-CM | POA: Insufficient documentation

## 2024-03-28 MED ORDER — CYCLOBENZAPRINE HCL 5 MG PO TABS: 5.0000 mg | ORAL_TABLET | Freq: Every day | ORAL | 5 refills | Status: DC

## 2024-03-28 MED ORDER — DICLOFENAC SODIUM 1 % EX GEL: 4.0000 g | Freq: Four times a day (QID) | CUTANEOUS | Status: AC

## 2024-03-28 MED ORDER — LIDOCAINE 5 % EX PTCH: 1.0000 | MEDICATED_PATCH | CUTANEOUS | 5 refills | Status: DC

## 2024-03-28 MED ORDER — OXYCODONE-ACETAMINOPHEN 5-325 MG PO TABS: 1.0000 | ORAL_TABLET | Freq: Every evening | ORAL | 0 refills | Status: DC | PRN

## 2024-03-28 NOTE — Patient Instructions (Addendum)
 Follow-up with my nurse practitioner Fidela in 1 month.  If pain is well-controlled on your current regimen, you will see her every other month and me every 6 months.  If not, we will follow-up more frequently.  Feel free to use MyChart between appointments to discuss any acute issues, making usually get back to within 48 hours.   Starting with your next medication refill in July, we will be switching from Norco 5 mg nightly to Percocet 5 mg nightly.  If there is an issue with this medication change, please contact the clinic and we can switch back.  Goal would be to eventually wean down to half a tab, and then switch to either tramadol  or a buprenorphine patch.  Switch from methocarbamol  to Flexeril  5 mg nightly as needed  Stop ibuprofen, due to concurrent Xarelto  use and history of gastric sleeve.  Use Voltaren  gel locally up to 4 times daily on joints of the shoulders, low back, and left ankle.  It is reasonable to try weaning off gabapentin . Start with AM dose and, if no changes in pain control, remove PM dose.   I refilled lidocaine  patches today  I recommend purchasing an over-the-counter lace up sleeve to help with stability of your left ankle, given ongoing pain and instability following your avulsion fracture.  Given failure of PT last year, we will hold off on additional regimens at this time.  Continue working in the pool 2-3 times weekly, working on strengthening and conditioning, as well as weight loss!

## 2024-03-28 NOTE — Progress Notes (Signed)
 Subjective:    Patient ID: Monique Clark, female    DOB: 1966/06/20, 58 y.o.   MRN: [id number]  HPI HPI  Monique Clark is a 58 y.o. year old female  who  has a past medical history of Abscess, Arthritis, Asthma, Atrial fibrillation (HCC), Cardiac arrhythmia due to congenital heart disease, Cervical lymphadenopathy, CVID (common variable immunodeficiency) (HCC) (2015), Diverticulosis, Elevated cholesterol, GERD (gastroesophageal reflux disease), Headache, High blood pressure, Non Hodgkin's lymphoma (HCC) (1998), Obesity, Personal history of gestational diabetes, PMR (polymyalgia rheumatica) (HCC), Pneumonia, PONV (postoperative nausea and vomiting), POTS (postural orthostatic tachycardia syndrome), Pre-diabetes, Sleep apnea, and UTI (urinary tract infection).   They are presenting to PM&R clinic as a new patient for pain management evaluation. They were referred by  Georgina Ozell LABOR, MD for treatment of L ankle and seronegative RA, PMR related pain.   Per recent documentation: She has had multiple back surgeries in the past for chronic low back pain. Patient has a diagnosis of OSA in which she uses a CPAP nightly. She reports her previous sleep study results showed moderate to severe OSA.  Source: Low back > shoulders, feet Inciting incident: none  Description of pain: constant and aching Exacerbating factors: activity Remitting factors: relaxation, pain medication, ice, music, and swimming (temporary) Red flag symptoms: No red flags for back pain endorsed in Hx or ROS  Medications tried: Topical medications (moderate effect) : Lidocaine  patches 5% on her lower back Nsaids (mild effect) : Ibuprofen 800 mg BID  - has A fib. No renal Hx. Had a gastric sleeve a few years ago.   Tylenol   (mild effect) : Uses only before IGG infusions  Opiates  (moderate effect) : Hydrocodone -acetaminophen  5 mg at bedtime - has more pain when she sleeps but only lasts about 4 hours  Has been on tramadol   in the past, with benefit, bot as good as Hydrocodone   Oxycodone    Gabapentin  / Lyrica  (unsure of effect) : 300 mg BID - has never tried off of it, never tried lyrica TCAs  (never tried) :  SNRIs  (unsure of effect) : Cymbalta  60 mg a day - for pain,  has been on for awhile Other  (mild effect) : Methocarbamol  500 mg at bedtime - I think flexeril  is more effective than that. Has also been on Tizanidine . Denies ever being on baclofen - was prescribed in 2021 but does not remember.    Patient reports she was recently prescribed Zepbound  by her PCP for weight loss. The prescription was denied through her insurance.  Other treatments: PT/OT  (no effect) : Most recently last year, Aquatherapy did 6 weeks 2x weekly, prior to back surgery. Did not do any post-op OP, but did home health for 1 month. She has a HEP and swims twice a week.   Accupuncture/chiropractor/massage  (never tried) : She has been cleared for massages (Hx NHL); has not gotten. Bad experiece with chiropractor.  TENs unit (mild effect) : Helpful during her therapies; she has a portable one but no longer has it.  Injections (no effect) : L5 Transforaminal Epidural Steroid Injection with Dr Eldonna - last few were not beneficial; R hip injections  - stopped due to osteonecrosis   Surgery (moderate effect) : Lumbar decompression L4-5  02/27/2023. She had postop hematoma seroma and had to have evacuation washout and reclosure 6 days post-op.  Unfortunately L5-S1 now has stenosis and she is trying to lose weight to qualify for a fusion.   Had bilateral hip  replacements  Other  () : Taking Zepbound  for weight loss  Goals for pain control: I just want to be a 2-3 on the pain scale; be able to walk. 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 I am still working and I use a scooter to get from the parking lot handicapped spot. She uses a walker for short distances and transfers.   Prior UDS results: No results found for: LABOPIA,  COCAINSCRNUR, LABBENZ, AMPHETMU, THCU, LABBARB    Pain Inventory Average Pain 5 Pain Right Now 5 My pain is aching  In the last 24 hours, has pain interfered with the following? General activity 8 Relation with others 2 Enjoyment of life 7 What TIME of day is your pain at its worst? morning , daytime, evening, and night Sleep (in general) Fair  Pain is worse with: walking and bending Pain improves with: rest, heat/ice, and medication Relief from Meds: 5  walk with assistance use a cane use a walker how many minutes can you walk? 1 ability to climb steps?  no do you drive?  yes transfers alone Needs help with household duties and shopping  employed # of hrs/week 40 hours  weakness numbness tremor trouble walking  Any changes since last visit?  no  Primary care Almarie Cleveland, MD Neurologist Dedra Fear, MD Rheumatologist Medford Ester, MD Orthopedist Oneil Herald, MD    Family History  Problem Relation Age of Onset   Atrial fibrillation Mother    Colon cancer Father    Cancer Father        Colon   Colon cancer Sister    Basal cell carcinoma Brother    Atrial fibrillation Maternal Uncle    Atrial fibrillation Maternal Grandmother    Atrial fibrillation Maternal Grandfather    Heart attack Maternal Grandfather    Ovarian cancer Neg Hx    Stomach cancer Neg Hx    Rectal cancer Neg Hx    Social History   Socioeconomic History   Marital status: Married    Spouse name: Not on file   Number of children: 3   Years of education: Not on file   Highest education level: Doctorate  Occupational History   Occupation: rn  Tobacco Use   Smoking status: Former    Current packs/day: 0.00    Average packs/day: 0.5 packs/day for 20.0 years (10.0 ttl pk-yrs)    Types: Cigarettes    Start date: 09/29/1982    Quit date: 09/29/2002    Years since quitting: 21.5    Passive exposure: Current   Smokeless tobacco: Never  Vaping Use   Vaping status: Never  Used  Substance and Sexual Activity   Alcohol use: Yes    Comment: once a month- rarely   Drug use: No   Sexual activity: Yes    Birth control/protection: Surgical, Post-menopausal    Comment: 1st intercourse 61 yo-5 partners  Other Topics Concern   Not on file  Social History Narrative   Not on file   Social Drivers of Health   Financial Resource Strain: Low Risk  (03/13/2023)   Overall Financial Resource Strain (CARDIA)    Difficulty of Paying Living Expenses: Not very hard  Food Insecurity: No Food Insecurity (03/13/2023)   Hunger Vital Sign    Worried About Running Out of Food in the Last Year: Never true    Ran Out of Food in the Last Year: Never true  Transportation Needs: No Transportation Needs (03/13/2023)   PRAPARE - Transportation  Lack of Transportation (Medical): No    Lack of Transportation (Non-Medical): No  Physical Activity: Unknown (03/13/2023)   Exercise Vital Sign    Days of Exercise per Week: 0 days    Minutes of Exercise per Session: Not on file  Stress: Stress Concern Present (03/13/2023)   [employer] of Occupational Health - Occupational Stress Questionnaire    Feeling of Stress : To some extent  Social Connections: Moderately Isolated (03/13/2023)   Social Connection and Isolation Panel    Frequency of Communication with Friends and Family: Twice a week    Frequency of Social Gatherings with Friends and Family: Once a week    Attends Religious Services: Never    Database administrator or Organizations: No    Attends Engineer, structural: Not on file    Marital Status: Married   Past Surgical History:  Procedure Laterality Date   ABDOMINAL HYSTERECTOMY     APPENDECTOMY     CESAREAN SECTION     CHOLECYSTECTOMY     COLONOSCOPY     HEMATOMA EVACUATION N/A 03/04/2023   Procedure: EVACUATION HEMATOMA SEROMA WITH RECLOSURE LUMBAR INCISION;  Surgeon: Barbarann Oneil BROCKS, MD;  Location: [facility] OR;  Service: Orthopedics;  Laterality: N/A;   JOINT  REPLACEMENT Left    hip   JOINT REPLACEMENT Right    Right Hip 2022   LAPAROSCOPIC GASTRIC SLEEVE RESECTION  04/29/2022   left shoulder repair     rotator cuff repair   LUMBAR LAMINECTOMY N/A 09/18/2021   Procedure: RIGHT LUMBAR FOUR-FIVE MICRODISCECTOMY;  Surgeon: Barbarann Oneil BROCKS, MD;  Location: [facility] OR;  Service: Orthopedics;  Laterality: N/A;   LUMBAR LAMINECTOMY/DECOMPRESSION MICRODISCECTOMY N/A 02/27/2023   Procedure: L4-5 LUMBAR DECOMPRESSION, MICRODISCECTOMY;  Surgeon: Barbarann Oneil BROCKS, MD;  Location: [facility] OR;  Service: Orthopedics;  Laterality: N/A;   LUNG REMOVAL, PARTIAL Right 2009   VATS, wedge resection partial lobectomy, done at [facility]   myringostomy Bilateral    Myringotomy   SPLENECTOMY, TOTAL  1998   TONSILLECTOMY AND ADENOIDECTOMY     UPPER GI ENDOSCOPY N/A 04/29/2022   Procedure: UPPER GI ENDOSCOPY;  Surgeon: Stevie Herlene Righter, MD;  Location: [facility];  Service: General;  Laterality: N/A;   Past Medical History:  Diagnosis Date   Abscess    Arthritis    Asthma    November 2023- hospitalized   Atrial fibrillation (HCC)    Cardiac arrhythmia due to congenital heart disease    trace MR 08/15/20 echo ([place])- pt denies congenital heart disease   Cervical lymphadenopathy    CVID (common variable immunodeficiency) (HCC) 2015   takes Hizentra    Diverticulosis    Elevated cholesterol    GERD (gastroesophageal reflux disease)    Headache    High blood pressure    Non Hodgkin's lymphoma (HCC) 1998   Obesity    Personal history of gestational diabetes    PMR (polymyalgia rheumatica) (HCC)    takes Kevzara    Pneumonia    PONV (postoperative nausea and vomiting)    POTS (postural orthostatic tachycardia syndrome)    Pre-diabetes    Sleep apnea    CPAP with 2L O2   UTI (urinary tract infection)    BP 115/73 (Patient Position: Sitting, Cuff Size: Large)   Pulse 60   Ht 5' 5 (1.651 m)   Wt (!) 308 lb 12.8 oz (140.1 kg)   SpO2 94%   BMI 51.39  kg/m   Opioid Risk Score:   Fall Risk Score:  `  1  Depression screen [facility] 2/9     03/28/2024    9:32 AM 03/16/2023    1:10 PM 09/18/2022   10:45 AM 08/28/2022   10:42 AM 08/28/2022   10:41 AM 11/25/2021   10:48 AM 01/15/2021    2:17 PM  Depression screen PHQ 2/9  Decreased Interest 0 0 0 0 0 0 0  Down, Depressed, Hopeless 1 0 0 0 0 0 0  PHQ - 2 Score 1 0 0 0 0 0 0  Altered sleeping 0   0     Tired, decreased energy 3   0     Change in appetite 1   0     Feeling bad or failure about yourself  0   0     Trouble concentrating 0   0     Moving slowly or fidgety/restless 0   0     Suicidal thoughts 0   0     PHQ-9 Score 5   0     Difficult doing work/chores Very difficult   Not difficult at all         Review of Systems  Respiratory:  Positive for apnea.   Cardiovascular:  Positive for leg swelling.  Musculoskeletal:  Positive for arthralgias, back pain, joint swelling and myalgias.       Lower back pain, leg pain, bilateral shoulder pain  All other systems reviewed and are negative.      Objective:   Physical Exam   PE: Constitution: Appropriate appearance for age. No apparent distress  +Obese Resp: No respiratory distress. No accessory muscle usage. on RA Cardio: Well perfused appearance. No peripheral edema. Abdomen: Nondistended. Nontender.   Psych: Appropriate mood and affect. Neuro: AAOx4. No apparent cognitive deficits   Neurologic Exam:   DTRs: Reflexes were 2+ in bilateral achilles, patella, biceps, BR and triceps.   Hoffmans: negative b/l Sensory exam: revealed normal sensation in all dermatomal regions in bilateral upper extremities and bilateral lower extremities Motor exam: strength 5/5 throughout bilateral upper extremities, bilateral lower extremities, and with exception of 4/5 BL SA Coordination: Fine motor coordination was normal.   Gait: Antalgic, cane in R hand, heavy offload of L foot; stiff, straight L leg  Back Exam:   Inspection: Pelvis was   even.  Lumbar lordotic curvature was increased.  There was  no evidence of scoliosis. Scar from prior surgery intact.  Palpation: Palpatory exam revealed ttp at the bilateral lumbar paraspinals, PSIS, and shoulders . There was  no evidence of spasm.   ROM revealed restricted ROM in proximal shoudlers . Special/provocative testing:    SLR: -   Slump test: ++    Facet loading: +(very non-specific)   TTP at paraspinals: +(sensitive for facet pain...if no ttp then likely not facet pain)    L foot: + ttp dorsum of the foot--no apparent deformity or swelling. Full AROM.    Information in () parenthesis is normals/details of specific exam.       Assessment & Plan:   Monique Clark is a 58 y.o. year old female  who  has a past medical history of Abscess, Arthritis, Asthma, Atrial fibrillation (HCC), Cardiac arrhythmia due to congenital heart disease, Cervical lymphadenopathy, CVID (common variable immunodeficiency) (HCC) (2015), Diverticulosis, Elevated cholesterol, GERD (gastroesophageal reflux disease), Headache, High blood pressure, Non Hodgkin's lymphoma (HCC) (1998), Obesity, Personal history of gestational diabetes, PMR (polymyalgia rheumatica) (HCC), Pneumonia, PONV (postoperative nausea and vomiting), POTS (postural orthostatic tachycardia syndrome), Pre-diabetes, Sleep apnea, and  UTI (urinary tract infection).   They are presenting to PM&R clinic as a new patient for treatment of lumbar radiculopathy, L ankle avulsion fx and seronegative RA, PMR related pain.    Chronic pain syndrome Encounter for opiate analgesic use agreement Encounter for medication management -     ToxAssure Select,+Antidepr,UR  Follow-up with my nurse practitioner Fidela in 1 month.  If pain is well-controlled on your current regimen, you will see her every other month and me every 6 months.  If not, we will follow-up more frequently.  Feel free to use MyChart between appointments to discuss any acute issues,  making usually get back to within 48 hours.   Starting with your next medication refill in July, we will be switching from Norco 5 mg nightly to Percocet 5 mg nightly.  If there is an issue with this medication change, please contact the clinic and we can switch back.  Goal would be to eventually wean down to half a tab, and then switch to either tramadol  or a buprenorphine patch.  Chronic bilateral low back pain with left-sided sciatica S/p Lumbar decompression L4-5  02/27/2023; now with L5-S1 stenosis Follows with Ortho Sp[facility] for management Failed ESI with Dr. Eldonna Minimal radicular symptoms on exam today;  It is reasonable to try weaning off gabapentin . Start with AM dose and, if no changes in pain control, remove PM dose.   PMR (polymyalgia rheumatica) (HCC) Seronegative rheumatoid arthritis (HCC) Follows with Dr. Jeannetta for infusions, management  Switch from methocarbamol  to Flexeril  5 mg nightly as needed  Stop ibuprofen, due to concurrent Xarelto  use and history of gastric sleeve.  Use Voltaren  gel locally up to 4 times daily on joints of the shoulders, low back, and left ankle.  I refilled lidocaine  patches today  Pain in left ankle and joints of left foot  I recommend purchasing an over-the-counter lace up sleeve to help with stability of your left ankle, given ongoing pain and instability following your avulsion fracture.  Morbid obesity (HCC) Starting zepbound  S/p gastric sleeve  Given failure of PT last year, we will hold off on additional regimens at this time.  Continue working in the pool 2-3 times weekly, working on strengthening and conditioning, as well as weight loss!  Other orders -     Lidocaine ; Place 1 patch onto the skin daily. Remove & Discard patch within 12 hours or as directed by MD  Dispense: 30 patch; Refill: 5 -     Diclofenac  Sodium; Apply 4 g topically 4 (four) times daily. -     oxyCODONE -Acetaminophen ; Take 1 tablet by mouth at bedtime as needed for  severe pain (pain score 7-10) or moderate pain (pain score 4-6).  Dispense: 30 tablet; Refill: 0 -     Cyclobenzaprine  HCl; Take 1 tablet (5 mg total) by mouth at bedtime.  Dispense: 30 tablet; Refill: 5

## 2024-03-29 ENCOUNTER — Other Ambulatory Visit: Payer: Self-pay

## 2024-03-29 DIAGNOSIS — L0390 Cellulitis, unspecified: Secondary | ICD-10-CM

## 2024-03-29 DIAGNOSIS — N390 Urinary tract infection, site not specified: Secondary | ICD-10-CM

## 2024-03-29 HISTORY — DX: Cellulitis, unspecified: L03.90

## 2024-03-29 HISTORY — DX: Urinary tract infection, site not specified: N39.0

## 2024-04-01 LAB — TOXASSURE SELECT,+ANTIDEPR,UR

## 2024-04-03 DIAGNOSIS — D801 Nonfamilial hypogammaglobulinemia: Secondary | ICD-10-CM | POA: Diagnosis not present

## 2024-04-03 MED ORDER — OXYCODONE-ACETAMINOPHEN 5-325 MG PO TABS: 1.0000 | ORAL_TABLET | Freq: Two times a day (BID) | ORAL | 0 refills | Status: DC | PRN

## 2024-04-03 NOTE — Addendum Note (Signed)
 Addended by: EMELINE SEARCH on: 04/03/2024 11:46 PM   Modules accepted: Orders

## 2024-04-12 ENCOUNTER — Other Ambulatory Visit (HOSPITAL_COMMUNITY): Payer: Self-pay

## 2024-04-15 NOTE — Telephone Encounter (Signed)
 See above; patient requesting increase in script next visit.

## 2024-04-17 DIAGNOSIS — D801 Nonfamilial hypogammaglobulinemia: Secondary | ICD-10-CM | POA: Diagnosis not present

## 2024-04-18 ENCOUNTER — Other Ambulatory Visit (HOSPITAL_COMMUNITY): Payer: Self-pay

## 2024-04-20 ENCOUNTER — Other Ambulatory Visit (HOSPITAL_COMMUNITY): Payer: Self-pay

## 2024-04-21 NOTE — Progress Notes (Signed)
 Subjective:    Patient ID: Monique Clark, female    DOB: [birth year], 58 y.o.   MRN: [id number]  HPI: Monique Clark is a 58 y.o. female who returns for follow up appointment for chronic pain and medication refill. She states her  pain is located in  her left shoulder, lower back and bilateral feet. She rates her pain 6. Her current exercise regime is walking short diatance with her cane and swimming twice a week.   Monique Clark is here  for a face to face power wheelchair evaluation, she reports frequent falls. The power wheelchair wiill help to promote a safe home environment and help her with her activities of daily living. A physical therapy evaluation order was placed today, she verbalizes understanding.   Monique Clark was educated on Falls Prevention, she verbalizes understanding   Monique Clark arrived bradycardia, apical pulse checked, medication list was reviewed.she is prescribe Bystolic .   Monique Clark Morphine  equivalent is  15.00  MME.   Last UDS was Performed on 03/28/2024, +ETOH, educated on the Narcotic Policy, she verbalizes understanding.     Pain Inventory Average Pain 5 Pain Right Now 6 My pain is constant, burning, and tingling  In the last 24 hours, has pain interfered with the following? General activity 7 Relation with others 3 Enjoyment of life 7 What TIME of day is your pain at its worst? morning  and night Sleep (in general) Fair  Pain is worse with: walking and some activites Pain improves with: heat/ice and medication Relief from Meds: 6  Family History  Problem Relation Age of Onset   Atrial fibrillation Mother    Colon cancer Father    Cancer Father        Colon   Colon cancer Sister    Basal cell carcinoma Brother    Atrial fibrillation Maternal Uncle    Atrial fibrillation Maternal Grandmother    Atrial fibrillation Maternal Grandfather    Heart attack Maternal Grandfather    Ovarian cancer Neg Hx    Stomach cancer Neg Hx    Rectal  cancer Neg Hx    Social History   Socioeconomic History   Marital status: Married    Spouse name: Not on file   Number of children: 3   Years of education: Not on file   Highest education level: Doctorate  Occupational History   Occupation: rn  Tobacco Use   Smoking status: Former    Current packs/day: 0.00    Average packs/day: 0.5 packs/day for 20.0 years (10.0 ttl pk-yrs)    Types: Cigarettes    Start date: 09/29/1982    Quit date: 09/29/2002    Years since quitting: 21.5    Passive exposure: Current   Smokeless tobacco: Never  Vaping Use   Vaping status: Never Used  Substance and Sexual Activity   Alcohol use: Yes    Comment: once a month- rarely   Drug use: No   Sexual activity: Yes    Birth control/protection: Surgical, Post-menopausal    Comment: 1st intercourse 50 yo-5 partners  Other Topics Concern   Not on file  Social History Narrative   Not on file   Social Drivers of Health   Financial Resource Strain: Low Risk  (03/13/2023)   Overall Financial Resource Strain (CARDIA)    Difficulty of Paying Living Expenses: Not very hard  Food Insecurity: No Food Insecurity (03/13/2023)   Hunger Vital Sign    Worried About Running Out of Food in the  Last Year: Never true    Ran Out of Food in the Last Year: Never true  Transportation Needs: No Transportation Needs (03/13/2023)   PRAPARE - Administrator, Civil Service (Medical): No    Lack of Transportation (Non-Medical): No  Physical Activity: Unknown (03/13/2023)   Exercise Vital Sign    Days of Exercise per Week: 0 days    Minutes of Exercise per Session: Not on file  Stress: Stress Concern Present (03/13/2023)   [employer] of Occupational Health - Occupational Stress Questionnaire    Feeling of Stress : To some extent  Social Connections: Moderately Isolated (03/13/2023)   Social Connection and Isolation Panel    Frequency of Communication with Friends and Family: Twice a week    Frequency of  Social Gatherings with Friends and Family: Once a week    Attends Religious Services: Never    Database administrator or Organizations: No    Attends Engineer, structural: Not on file    Marital Status: Married   Past Surgical History:  Procedure Laterality Date   ABDOMINAL HYSTERECTOMY     APPENDECTOMY     CESAREAN SECTION     CHOLECYSTECTOMY     COLONOSCOPY     HEMATOMA EVACUATION N/A 03/04/2023   Procedure: EVACUATION HEMATOMA SEROMA WITH RECLOSURE LUMBAR INCISION;  Surgeon: Barbarann Oneil BROCKS, MD;  Location: [facility];  Service: Orthopedics;  Laterality: N/A;   JOINT REPLACEMENT Left    hip   JOINT REPLACEMENT Right    Right Hip 2022   LAPAROSCOPIC GASTRIC SLEEVE RESECTION  04/29/2022   left shoulder repair     rotator cuff repair   LUMBAR LAMINECTOMY N/A 09/18/2021   Procedure: RIGHT LUMBAR FOUR-FIVE MICRODISCECTOMY;  Surgeon: Barbarann Oneil BROCKS, MD;  Location: [facility];  Service: Orthopedics;  Laterality: N/A;   LUMBAR LAMINECTOMY/DECOMPRESSION MICRODISCECTOMY N/A 02/27/2023   Procedure: L4-5 LUMBAR DECOMPRESSION, MICRODISCECTOMY;  Surgeon: Barbarann Oneil BROCKS, MD;  Location: [facility];  Service: Orthopedics;  Laterality: N/A;   LUNG REMOVAL, PARTIAL Right 2009   VATS, wedge resection partial lobectomy, done at [facility]   myringostomy Bilateral    Myringotomy   SPLENECTOMY, TOTAL  1998   TONSILLECTOMY AND ADENOIDECTOMY     UPPER GI ENDOSCOPY N/A 04/29/2022   Procedure: UPPER GI ENDOSCOPY;  Surgeon: Stevie, Herlene Righter, MD;  Location: [facility];  Service: General;  Laterality: N/A;   Past Surgical History:  Procedure Laterality Date   ABDOMINAL HYSTERECTOMY     APPENDECTOMY     CESAREAN SECTION     CHOLECYSTECTOMY     COLONOSCOPY     HEMATOMA EVACUATION N/A 03/04/2023   Procedure: EVACUATION HEMATOMA SEROMA WITH RECLOSURE LUMBAR INCISION;  Surgeon: Barbarann Oneil BROCKS, MD;  Location: [facility];  Service: Orthopedics;  Laterality: N/A;   JOINT REPLACEMENT Left    hip   JOINT REPLACEMENT Right     Right Hip 2022   LAPAROSCOPIC GASTRIC SLEEVE RESECTION  04/29/2022   left shoulder repair     rotator cuff repair   LUMBAR LAMINECTOMY N/A 09/18/2021   Procedure: RIGHT LUMBAR FOUR-FIVE MICRODISCECTOMY;  Surgeon: Barbarann Oneil BROCKS, MD;  Location: [facility];  Service: Orthopedics;  Laterality: N/A;   LUMBAR LAMINECTOMY/DECOMPRESSION MICRODISCECTOMY N/A 02/27/2023   Procedure: L4-5 LUMBAR DECOMPRESSION, MICRODISCECTOMY;  Surgeon: Barbarann Oneil BROCKS, MD;  Location: [facility];  Service: Orthopedics;  Laterality: N/A;   LUNG REMOVAL, PARTIAL Right 2009   VATS, wedge resection partial lobectomy, done at [facility]  myringostomy Bilateral    Myringotomy   SPLENECTOMY, TOTAL  1998   TONSILLECTOMY AND ADENOIDECTOMY     UPPER GI ENDOSCOPY N/A 04/29/2022   Procedure: UPPER GI ENDOSCOPY;  Surgeon: Stevie, Herlene Righter, MD;  Location: [facility];  Service: General;  Laterality: N/A;   Past Medical History:  Diagnosis Date   Abscess    Arthritis    Asthma    November 2023- hospitalized   Atrial fibrillation (HCC)    Cardiac arrhythmia due to congenital heart disease    trace MR 08/15/20 echo ([place])- pt denies congenital heart disease   Cervical lymphadenopathy    CVID (common variable immunodeficiency) (HCC) 2015   takes Hizentra    Diverticulosis    Elevated cholesterol    GERD (gastroesophageal reflux disease)    Headache    High blood pressure    Non Hodgkin's lymphoma (HCC) 1998   Obesity    Personal history of gestational diabetes    PMR (polymyalgia rheumatica) (HCC)    takes Kevzara    Pneumonia    PONV (postoperative nausea and vomiting)    POTS (postural orthostatic tachycardia syndrome)    Pre-diabetes    Sleep apnea    CPAP with 2L O2   UTI (urinary tract infection)    BP 138/83 (BP Location: Right Arm, Patient Position: Sitting, Cuff Size: Large)   Pulse (!) 56   Ht 5' 5 (1.651 m)   Wt (!) 305 lb 9.6 oz (138.6 kg)   SpO2 96%   BMI 50.85 kg/m   Opioid Risk  Score:   Fall Risk Score:  `1  Depression screen [facility] 2/9     04/22/2024    9:20 AM 03/28/2024    9:32 AM 03/16/2023    1:10 PM 09/18/2022   10:45 AM 08/28/2022   10:42 AM 08/28/2022   10:41 AM 11/25/2021   10:48 AM  Depression screen PHQ 2/9  Decreased Interest 1 0 0 0 0 0 0  Down, Depressed, Hopeless 1 1 0 0 0 0 0  PHQ - 2 Score 2 1 0 0 0 0 0  Altered sleeping  0   0    Tired, decreased energy  3   0    Change in appetite  1   0    Feeling bad or failure about yourself   0   0    Trouble concentrating  0   0    Moving slowly or fidgety/restless  0   0    Suicidal thoughts  0   0    PHQ-9 Score  5   0    Difficult doing work/chores  Very difficult   Not difficult at all      Review of Systems  Musculoskeletal:        Left arm Left hand numbness Left and Right shoulders Left and right lower back Left and right feet  Psychiatric/Behavioral:  Positive for dysphoric mood.   All other systems reviewed and are negative.      Objective:   Physical Exam Vitals and nursing note reviewed.  Constitutional:      Appearance: Normal appearance. She is obese.  Cardiovascular:     Rate and Rhythm: Regular rhythm. Bradycardia present.     Pulses: Normal pulses.     Heart sounds: Normal heart sounds.  Neurological:     Mental Status: She is alert.           Assessment & Plan:  Chronic Let Shoulder Pain: Continue HEP  as Tolerated. Continue to Monitor.  Lower Back Pain: Continue HEP as tolerated. Continue to Monitor.  Bilateral Foot Pain: Continue HEP as Tolerated. Continue current medication regimen. Continue to monitor.  Fall at Home: Educated on falls prevention, she verbalizes understanding. Continue to monitor.  Chronic Pain syndrome: Continue Gabapentin . Refilled: Oxycodone  5mg /325 one tablet twice a day as needed for pain #60. We will continue the opioid monitoring program, this consists of regular clinic visits, examinations, urine drug screen, pill counts as well as  use of [place]  Controlled Substance Reporting system. A 12 month History has been reviewed on the [place]  Controlled Substance Reporting System on 04/22/2024 F/U in 2 months

## 2024-04-22 ENCOUNTER — Telehealth: Payer: Self-pay | Admitting: Registered Nurse

## 2024-04-22 ENCOUNTER — Encounter: Attending: Physical Medicine and Rehabilitation | Admitting: Registered Nurse

## 2024-04-22 ENCOUNTER — Encounter: Payer: Self-pay | Admitting: Registered Nurse

## 2024-04-22 VITALS — BP 138/83 | HR 60 | Ht 65.0 in | Wt 305.6 lb

## 2024-04-22 DIAGNOSIS — M79672 Pain in left foot: Secondary | ICD-10-CM | POA: Insufficient documentation

## 2024-04-22 DIAGNOSIS — M545 Low back pain, unspecified: Secondary | ICD-10-CM | POA: Diagnosis not present

## 2024-04-22 DIAGNOSIS — W19XXXD Unspecified fall, subsequent encounter: Secondary | ICD-10-CM | POA: Insufficient documentation

## 2024-04-22 DIAGNOSIS — Z5181 Encounter for therapeutic drug level monitoring: Secondary | ICD-10-CM | POA: Diagnosis not present

## 2024-04-22 DIAGNOSIS — G894 Chronic pain syndrome: Secondary | ICD-10-CM | POA: Insufficient documentation

## 2024-04-22 DIAGNOSIS — G8929 Other chronic pain: Secondary | ICD-10-CM | POA: Diagnosis present

## 2024-04-22 DIAGNOSIS — M25512 Pain in left shoulder: Secondary | ICD-10-CM | POA: Insufficient documentation

## 2024-04-22 DIAGNOSIS — Y92009 Unspecified place in unspecified non-institutional (private) residence as the place of occurrence of the external cause: Secondary | ICD-10-CM | POA: Diagnosis not present

## 2024-04-22 DIAGNOSIS — M79671 Pain in right foot: Secondary | ICD-10-CM | POA: Insufficient documentation

## 2024-04-22 DIAGNOSIS — Z79899 Other long term (current) drug therapy: Secondary | ICD-10-CM | POA: Diagnosis not present

## 2024-04-22 MED ORDER — OXYCODONE-ACETAMINOPHEN 5-325 MG PO TABS: 1.0000 | ORAL_TABLET | Freq: Two times a day (BID) | ORAL | 0 refills | Status: DC | PRN

## 2024-04-22 MED ORDER — OXYCODONE-ACETAMINOPHEN 5-325 MG PO TABS: 1.0000 | ORAL_TABLET | Freq: Two times a day (BID) | ORAL | 0 refills | Status: AC | PRN

## 2024-04-22 NOTE — Telephone Encounter (Signed)
 Call placed to [facility], spoke with pharmacist.  They will fill Monique Clark [prescription today. Call placed to Ms. Preisler regarding the above, she verbalizes understanding.

## 2024-04-25 DIAGNOSIS — G4733 Obstructive sleep apnea (adult) (pediatric): Secondary | ICD-10-CM | POA: Diagnosis not present

## 2024-04-25 DIAGNOSIS — J189 Pneumonia, unspecified organism: Secondary | ICD-10-CM | POA: Diagnosis not present

## 2024-04-29 NOTE — Progress Notes (Signed)
 Office Visit Note  Patient: Monique Clark             Date of Birth: 02-Aug-1966           MRN: [id number]             PCP: Rollene Almarie LABOR, MD Referring: Rollene Almarie LABOR, * Visit Date: 05/09/2024   Subjective:  Follow-up (Left arm pain , numbness.)   Discussed the use of AI scribe software for clinical note transcription with the patient, who gave verbal consent to proceed.  History of Present Illness   Monique Clark is a 58 y.o. female here for follow up for PMR on Kevzara  200mg  subcu every 14 days.    She experiences significant swelling in her arm, which has always been slightly larger, and numbness in two fingers, resembling cubital tunnel syndrome. The numbness primarily affects two fingers, and no recent activities have exacerbated the condition. She has a history of tendinitis in the area. She is not using braces or doing specific exercises for her elbow, and no recent imaging has been performed. She has difficulty with grasping, particularly with her right hand.  She recently had an episode of cellulitis following a bug bite and a bad yeast infection under her breasts and back, which she has experienced before during humid weather. The yeast infection was treated with Diflucan  and nystatin , and she is now using a moisture barrier instead of talc powder.  She mentions ongoing back pain, for which she uses lidocaine  patches, and notes that her shoulders feel better with Kevzara . She has not been engaged in physical therapy recently. Her leg swelling remains the same, with a slight increase on the left side, but not severe.  No recent illness aside from the cellulitis and yeast infection. Back pain persists daily but feels partially improved with Kevzara . Hand numbness is bothersome, particularly in the right hand.       Previous HPI 02/03/2024 Monique Clark is a 58 y.o. female here for follow up for PMR on KEVZARA  200mg  subcut every 14 days.     Cervical  lymphadenopathy has been present since January, accompanied by hoarseness and left-sided pain. An ultrasound and CT scan revealed the largest lymph node measuring 1.2 cm. She has a history of non-Hodgkin lymphoma diagnosed 25 years ago, for which she underwent a splenectomy and RTX treatment.   She has hypogammaglobulinemia, which led to several infections last year, including a UTI, upper respiratory infections, flu, and COVID-19. This was while off her HIzentra  injections. Since resuming, her immunoglobulin levels have since stabilized, and she has not experienced further infections this year. She administers Hizentra  weekly on Sundays.   She is on Kevzara , which she paused for a month in January-February due to concerns about its effects on her lymphadenopathy. During this period, she experienced increased swelling on the left side, joint pain, and body pain. She resumed Kevzara  two weeks ago and takes it weekly on Wednesdays.   She experienced oral thrush, treated with a five-day course of Diflucan , and consulted an ENT who noted possible edema. She also reports nystagmus and increased morning stiffness. No recent yeast rashes, but she has had them in the past.   She mentions a recent ablation fracture in her foot, leading to stiffness and soreness in her ankle. She was in a boot for a month. She uses a scooter at work to aid mobility.       Previous HPI 10/13/2023 Monique Clark is a 58 y.o. female  here for follow up for PMR on KEVZARA  200mg  subcut every 14 days.  They report a gastrointestinal upset followed by an upper respiratory infection, and most recently, symptoms suggestive of a urinary tract infection (UTI) that began last Saturday. The UTI symptoms, including a burning sensation during urination, have slightly improved with home remedies and over-the-counter Azo.   In the context of these illnesses, the patient chose to skip their last dose of Kevzara , which was due two weeks ago.  They also report a sore throat and neck pain, with a sensation of swelling on one side. Despite these issues, they note that their throat hoarseness is improving.   Prior to these recent illnesses, the patient had been experiencing joint pain, which had not significantly worsened. They had a short course of steroids during their recent illness, which seemed to alleviate some of their symptoms. However, they now report new back pain, which they suspect may be related to the potential UTI. They also note occasional numbness in the lower back region.   The patient also mentions increased difficulty with long-distance walking, necessitating the use of a scooter for such instances. They report some swelling in their left foot, but deny any significant edema. They also report a recent scratch on their elbow, which has been causing some discomfort.         Previous HPI 06/24/2023 Monique Clark is a 58 y.o. female here for follow up for PMR on KEVZARA  200mg  subcut every 14 days.  She feels symptoms are overall doing better on the medicine consistently but has some swelling and stiffness worse in the left arm. Particularly shoulder and elbow hurts worse with use but also achy at night. Low back pain is worse, had updated MRI on 8/18 with progression of degenerative changes. Following up with orthopedics for this, recommending possible fusion surgery but BMI is not appropriate.   Previous HPI 03/24/2023 Monique Clark is a 58 y.o. female here for follow up for PMR on KEVZARA  200mg  subcut every 14 days.  She started the Kevzara  in March and reports improvement in joint pain and stiffness.  She was able to completely discontinue prednisone  without severe exacerbation of joint pain and weakness.  Her treatment was interrupted for several weeks due to going for her lumbar laminectomy at L4-L5 admitted for this May 31.  This was mildly complicated by requiring evacuation of associated hematoma seroma before  discharge home.  Since then is proceeding well still has soreness in the area stitches still in place.  She since resumed Kevzara  so far only 1 dose but notes seeing improvement in joint stiffness starting again within days.   Previous HPI 11/17/2022 Monique Clark is a 58 y.o. female here for follow up for PMR on prednisone  10 mg daily.  May recheck labs previously in January getting back onto this medication dose still showing very highly elevated serum inflammatory markers.  Symptoms are not too bad while on medication but she has not tolerated tapering.  In the number of infectious issues related to URI but currently this has cleared up as well.     Previous HPI 09/18/22 Monique Clark is a 58 y.o. female here for follow up for PMR on prednisone  10 mg daily. Since the past visit she had a hospital admission for hypoxia suspected as viral URI trigger of asthma exacerbation. She stopped methotrexate  at that time and has been holding off resuming the medication concern about contributing to infection.  Currently is partly through a course of  Augmentin for the persistent upper respiratory infection.  She had lumbar spine injection for ongoing back pain without a great relief of symptoms this time.   Previous HPI 05/20/22 Monique Clark is a 58 y.o. female here for follow up for PMR on methotrexate  15 mg p.o. weekly.  Since her last visit she underwent gastric sleeve resection surgery for weight reduction.  This has still been in the recovery process.  She discontinued the prednisone  perioperatively and has been off this.  She notices multiple symptoms are worse with joint pain and swelling especially in shoulders and elbows.  Not as much lower extremity swelling.   Previous HPI 01/06/2022 Monique Clark is a 58 y.o. female here for follow up for PMR on prednisone  10 mg daily and started methotrexate  10 mg PO weekly last month due to persistent inflammatory lab elevations.  She continues having  fatigue is persistent.  Shortness of breath with walking and exertion is slightly improved.  She has not noticed any side effects or intolerance to methotrexate .  She is continuing to try to work on her exercise as a part of preparation for bariatric surgery also awaiting upcoming cardiac evaluation.     Previous HPI 12/02/21 Monique Clark is a 58 y.o. female here for muscle pain and weakness in shoulders and legs concern for history of PMR previously treated with long term prednisone .  She has a history of CVID on IVIG, non hodgkins lymphoma, also has history of bilateral hip replacement. Recently had microdiscectomy about 6 weeks prior. Started prednisone  40 mg daily last month decreased to 20 mg daily and now is down to 10 mg dose. She felt her symptoms improve about 80-90% when starting prednisone  again and tapering has been okay, only slight increase with this dose reduction. She does not see any particular swelling, discoloration, or rashes. She has noticed some hand tremor more when using her hands that is not typical for her.   Review of Systems  Constitutional:  Positive for fatigue.  HENT:  Positive for mouth dryness. Negative for mouth sores.   Eyes:  Positive for dryness.  Respiratory:  Negative for shortness of breath.   Cardiovascular:  Positive for palpitations. Negative for chest pain.  Gastrointestinal:  Negative for blood in stool, constipation and diarrhea.  Endocrine: Negative for increased urination.  Genitourinary:  Negative for involuntary urination.  Musculoskeletal:  Positive for joint pain, gait problem, joint pain, joint swelling, myalgias, muscle weakness, morning stiffness, muscle tenderness and myalgias.  Skin:  Negative for color change, rash, hair loss and sensitivity to sunlight.  Allergic/Immunologic: Positive for susceptible to infections.  Neurological:  Positive for headaches. Negative for dizziness.  Hematological:  Positive for swollen glands.   Psychiatric/Behavioral:  Negative for depressed mood and sleep disturbance. The patient is not nervous/anxious.     PMFS History:  Patient Active Problem List   Diagnosis Date Noted   Chronic pain syndrome 03/28/2024   Encounter for opiate analgesic use agreement 03/28/2024   Encounter for medication management 03/28/2024   Chronic bilateral low back pain with left-sided sciatica 03/28/2024   Pain in left ankle and joints of left foot 11/24/2023   Candida esophagitis (HCC) 11/02/2023   Exposure to the flu 11/02/2023   Encounter for general adult medical examination with abnormal findings 03/17/2023   Recurrent fracture of lumbar vertebra (HCC) 03/01/2023   History of lumbar laminectomy for spinal cord decompression 02/27/2023   Seronegative rheumatoid arthritis (HCC) 11/17/2022   Nocturnal hypoxemia 08/29/2022   Pulmonary nodule  08/16/2022   Recurrent herniation of lumbar disc 07/25/2022   OSA on CPAP 05/19/2022   Impingement syndrome of left shoulder 05/13/2022   Leukocytosis 01/20/2022   Morbid obesity (HCC) 01/01/2022   Chronic fatigue and immune dysfunction syndrome (HCC) 01/01/2022   High risk medication use 12/02/2021   PMR (polymyalgia rheumatica) (HCC) 11/05/2021   Status post lumbar spine operative procedure for decompression of spinal cord 09/27/2021   Insomnia 05/28/2021   COVID-19 05/07/2021   Malignant (primary) neoplasm, unspecified (HCC) 03/08/2021   Personal history of urinary (tract) infections 03/08/2021   Personal history of pneumonia (recurrent) 03/08/2021   Elevated serum hCG 07/02/2020   POTS (postural orthostatic tachycardia syndrome) 03/08/2020   Mixed hyperlipidemia 03/21/2019   Paroxysmal atrial fibrillation (HCC)    Localized swelling of left lower extremity    Essential hypertension    Mitral valve regurgitation 06/22/2017   CVID (common variable immunodeficiency) (HCC) 05/07/2015   Prediabetes 01/20/2013   History of non-Hodgkin's lymphoma  12/21/2012    Past Medical History:  Diagnosis Date   Abscess    Arthritis    Asthma    November 2023- hospitalized   Atrial fibrillation (HCC)    Cardiac arrhythmia due to congenital heart disease    trace MR 08/15/20 echo ([place])- pt denies congenital heart disease   Cellulitis 03/2024   Cervical lymphadenopathy    CVID (common variable immunodeficiency) (HCC) 2015   takes Hizentra    Diverticulosis    Elevated cholesterol    GERD (gastroesophageal reflux disease)    Headache    High blood pressure    Non Hodgkin's lymphoma (HCC) 1998   Obesity    Personal history of gestational diabetes    PMR (polymyalgia rheumatica) (HCC)    takes Kevzara    Pneumonia    PONV (postoperative nausea and vomiting)    POTS (postural orthostatic tachycardia syndrome)    Pre-diabetes    Sleep apnea    CPAP with 2L O2   UTI (urinary tract infection)    UTI (urinary tract infection) 03/2024    Family History  Problem Relation Age of Onset   Atrial fibrillation Mother    Sleep apnea Mother    Colon cancer Father    Cancer Father        Colon   Colon cancer Sister    Basal cell carcinoma Brother    Atrial fibrillation Maternal Uncle    Atrial fibrillation Maternal Grandmother    Atrial fibrillation Maternal Grandfather    Heart attack Maternal Grandfather    Ovarian cancer Neg Hx    Stomach cancer Neg Hx    Rectal cancer Neg Hx    Past Surgical History:  Procedure Laterality Date   ABDOMINAL HYSTERECTOMY     APPENDECTOMY     CESAREAN SECTION     CHOLECYSTECTOMY     COLONOSCOPY     HEMATOMA EVACUATION N/A 03/04/2023   Procedure: EVACUATION HEMATOMA SEROMA WITH RECLOSURE LUMBAR INCISION;  Surgeon: Barbarann Oneil BROCKS, MD;  Location: [facility] OR;  Service: Orthopedics;  Laterality: N/A;   JOINT REPLACEMENT Left    hip   JOINT REPLACEMENT Right    Right Hip 2022   LAPAROSCOPIC GASTRIC SLEEVE RESECTION  04/29/2022   left shoulder repair     rotator cuff repair   LUMBAR  LAMINECTOMY N/A 09/18/2021   Procedure: RIGHT LUMBAR FOUR-FIVE MICRODISCECTOMY;  Surgeon: Barbarann Oneil BROCKS, MD;  Location: [facility] OR;  Service: Orthopedics;  Laterality: N/A;   LUMBAR LAMINECTOMY/DECOMPRESSION MICRODISCECTOMY N/A 02/27/2023  Procedure: L4-5 LUMBAR DECOMPRESSION, MICRODISCECTOMY;  Surgeon: Barbarann Oneil BROCKS, MD;  Location: [facility] OR;  Service: Orthopedics;  Laterality: N/A;   LUNG REMOVAL, PARTIAL Right 2009   VATS, wedge resection partial lobectomy, done at [facility]   myringostomy Bilateral    Myringotomy   SPLENECTOMY, TOTAL  1998   TONSILLECTOMY AND ADENOIDECTOMY     UPPER GI ENDOSCOPY N/A 04/29/2022   Procedure: UPPER GI ENDOSCOPY;  Surgeon: Stevie, Herlene Righter, MD;  Location: [facility];  Service: General;  Laterality: N/A;   Social History   Social History Narrative   Pt lives alone    Pt works    Immunization History  Administered Date(s) Administered   Influenza-Unspecified 07/27/2020, 07/08/2022   PFIZER(Purple Top)SARS-COV-2 Vaccination 09/24/2019, 10/12/2019, 07/16/2020     Objective: Vital Signs: BP 131/82 (BP Location: Right Arm, Patient Position: Sitting, Cuff Size: Large)   Pulse (!) 54   Resp 18   Ht 5' 5 (1.651 m)   Wt (!) 307 lb 12.8 oz (139.6 kg)   BMI 51.22 kg/m    Physical Exam Constitutional:      Appearance: She is obese.  Eyes:     Conjunctiva/sclera: Conjunctivae normal.  Cardiovascular:     Rate and Rhythm: Normal rate and regular rhythm.  Pulmonary:     Effort: Pulmonary effort is normal.     Breath sounds: Normal breath sounds.  Lymphadenopathy:     Cervical: No cervical adenopathy.  Skin:    General: Skin is warm and dry.     Findings: No rash.  Neurological:     Mental Status: She is alert.  Psychiatric:        Mood and Affect: Mood normal.      Musculoskeletal Exam:  Right shoulder tenderness to pressure, guarding against full overhead abduction Left worse than right elbow extension range of motion limited by about 25-30  degrees, no focal tenderness or swelling Wrists full ROM no tenderness or swelling Fingers full ROM no tenderness or swelling Bilateral paraspinal tenderness to pressure throughout middle and lower back Hip internal rotation slightly restricted, bilateral lateral tenderness to pressure Knees full ROM no tenderness or swelling    Investigation: No additional findings.  Imaging: No results found.  Recent Labs: Lab Results  Component Value Date   WBC 9.1 05/09/2024   HGB 13.2 05/09/2024   PLT 278 05/09/2024   NA 139 05/09/2024   K 4.8 05/09/2024   CL 103 05/09/2024   CO2 29 05/09/2024   GLUCOSE 84 05/09/2024   BUN 17 05/09/2024   CREATININE 0.78 05/09/2024   BILITOT 0.4 05/09/2024   ALKPHOS 70 08/16/2022   AST 23 05/09/2024   ALT 16 05/09/2024   PROT 6.6 05/09/2024   ALBUMIN 3.6 08/16/2022   CALCIUM  9.5 05/09/2024   GFRAA >60 03/18/2020   QFTBGOLDPLUS NEGATIVE 09/14/2023    Speciality Comments: No specialty comments available.  Procedures:  No procedures performed Allergies: Chlorhexidine , Tetanus-diphtheria toxoids td, Nizatidine, Promethazine , and Tetanus toxoids   Assessment / Plan:     Visit Diagnoses: PMR (polymyalgia rheumatica) (HCC) - Plan: Sedimentation rate, C-reactive protein Rheumatoid arthritis with right elbow tendinitis and right cubital tunnel syndrome Chronic rheumatoid arthritis with right elbow tendinitis and cubital tunnel syndrome. Increased swelling and numbness in the right arm, consistent with cubital tunnel syndrome. Limited extension and tenderness at the medial and lateral epicondyle, consistent with tendinitis. Kevzara  helps with symptoms, but inflammation markers were previously elevated. Discussed nerve compression due to swelling or bone spurs and  potential benefit of nerve glide exercises. - Provide printout of nerve glide exercises for right arm. - Recheck sed rate and CRP today. - Consider temporary steroid treatment if inflammatory  markers remain elevated. - Discuss potential adjustment of Kevzara  dosing to 162 mg subcu once weekly if necessary.  High risk medication use - Kevzara  200 mg subcu q. 14 days. - Plan: CBC with Differential/Platelet, Comprehensive metabolic panel with GFR - Checking CBC and CMP for medication monitoring on continued to Or  Chronic back pain Chronic back pain with partial improvement on Kevzara . Uses lidocaine  patches for relief.  Left leg swelling Mild left leg swelling, not significantly bothersome.  Recurrent intertrigo (cutaneous candidiasis) Recurrent intertrigo under breasts and back, exacerbated by humidity. Improved with Diflucan  and nystatin . Uses moisture barrier for management.        Orders: Orders Placed This Encounter  Procedures   Sedimentation rate   C-reactive protein   CBC with Differential/Platelet   Comprehensive metabolic panel with GFR   No orders of the defined types were placed in this encounter.    Follow-Up Instructions: Return in about 3 months (around 08/09/2024) for PMR on KEV f/u 3mos.   Lonni LELON Ester, MD  Note - This record has been created using [employer].  Chart creation errors have been sought, but may not always  have been located. Such creation errors do not reflect on  the standard of medical care.

## 2024-05-08 ENCOUNTER — Other Ambulatory Visit (HOSPITAL_COMMUNITY): Payer: Self-pay

## 2024-05-08 ENCOUNTER — Other Ambulatory Visit: Payer: Self-pay | Admitting: Internal Medicine

## 2024-05-09 ENCOUNTER — Ambulatory Visit: Attending: Internal Medicine | Admitting: Internal Medicine

## 2024-05-09 ENCOUNTER — Encounter: Payer: Self-pay | Admitting: Internal Medicine

## 2024-05-09 ENCOUNTER — Other Ambulatory Visit: Payer: Self-pay

## 2024-05-09 ENCOUNTER — Other Ambulatory Visit (HOSPITAL_COMMUNITY): Payer: Self-pay

## 2024-05-09 VITALS — BP 131/82 | HR 54 | Resp 18 | Ht 65.0 in | Wt 307.8 lb

## 2024-05-09 DIAGNOSIS — M353 Polymyalgia rheumatica: Secondary | ICD-10-CM

## 2024-05-09 DIAGNOSIS — Z79899 Other long term (current) drug therapy: Secondary | ICD-10-CM

## 2024-05-09 MED ORDER — NEBIVOLOL HCL 5 MG PO TABS
5.0000 mg | ORAL_TABLET | Freq: Every day | ORAL | 5 refills | Status: AC
  Filled 2024-05-09: qty 30, 30d supply, fill #0
  Filled 2024-06-07: qty 30, 30d supply, fill #1
  Filled 2024-08-04: qty 30, 30d supply, fill #2
  Filled 2024-09-04: qty 30, 30d supply, fill #3
  Filled 2024-10-09: qty 30, 30d supply, fill #4

## 2024-05-09 MED ORDER — DULOXETINE HCL 60 MG PO CPEP
60.0000 mg | ORAL_CAPSULE | Freq: Every day | ORAL | 2 refills | Status: AC
  Filled 2024-05-09 – 2024-06-07 (×2): qty 90, 90d supply, fill #0
  Filled 2024-06-27 – 2024-08-19 (×3): qty 90, 90d supply, fill #1

## 2024-05-10 LAB — CBC WITH DIFFERENTIAL/PLATELET
Absolute Lymphocytes: 2857 {cells}/uL (ref 850–3900)
Absolute Monocytes: 1210 {cells}/uL — ABNORMAL HIGH (ref 200–950)
Basophils Absolute: 73 {cells}/uL (ref 0–200)
Basophils Relative: 0.8 %
Eosinophils Absolute: 118 {cells}/uL (ref 15–500)
Eosinophils Relative: 1.3 %
HCT: 40.9 % (ref 35.0–45.0)
Hemoglobin: 13.2 g/dL (ref 11.7–15.5)
MCH: 29.8 pg (ref 27.0–33.0)
MCHC: 32.3 g/dL (ref 32.0–36.0)
MCV: 92.3 fL (ref 80.0–100.0)
MPV: 12.3 fL (ref 7.5–12.5)
Monocytes Relative: 13.3 %
Neutro Abs: 4841 {cells}/uL (ref 1500–7800)
Neutrophils Relative %: 53.2 %
Platelets: 278 Thousand/uL (ref 140–400)
RBC: 4.43 Million/uL (ref 3.80–5.10)
RDW: 13.1 % (ref 11.0–15.0)
Total Lymphocyte: 31.4 %
WBC: 9.1 Thousand/uL (ref 3.8–10.8)

## 2024-05-10 LAB — COMPREHENSIVE METABOLIC PANEL WITH GFR
AG Ratio: 1.5 (calc) (ref 1.0–2.5)
ALT: 16 U/L (ref 6–29)
AST: 23 U/L (ref 10–35)
Albumin: 4 g/dL (ref 3.6–5.1)
Alkaline phosphatase (APISO): 77 U/L (ref 37–153)
BUN: 17 mg/dL (ref 7–25)
CO2: 29 mmol/L (ref 20–32)
Calcium: 9.5 mg/dL (ref 8.6–10.4)
Chloride: 103 mmol/L (ref 98–110)
Creat: 0.78 mg/dL (ref 0.50–1.03)
Globulin: 2.6 g/dL (ref 1.9–3.7)
Glucose, Bld: 84 mg/dL (ref 65–99)
Potassium: 4.8 mmol/L (ref 3.5–5.3)
Sodium: 139 mmol/L (ref 135–146)
Total Bilirubin: 0.4 mg/dL (ref 0.2–1.2)
Total Protein: 6.6 g/dL (ref 6.1–8.1)
eGFR: 88 mL/min/1.73m2 (ref >=60)

## 2024-05-10 LAB — SEDIMENTATION RATE: Sed Rate: 28 mm/h (ref 0–30)

## 2024-05-10 LAB — C-REACTIVE PROTEIN: CRP: 45.1 mg/L — ABNORMAL HIGH (ref <8.0)

## 2024-05-15 DIAGNOSIS — D801 Nonfamilial hypogammaglobulinemia: Secondary | ICD-10-CM | POA: Diagnosis not present

## 2024-05-16 ENCOUNTER — Other Ambulatory Visit: Payer: Self-pay

## 2024-05-16 ENCOUNTER — Other Ambulatory Visit: Payer: Self-pay | Admitting: Internal Medicine

## 2024-05-16 ENCOUNTER — Other Ambulatory Visit: Payer: Self-pay | Admitting: Pharmacy Technician

## 2024-05-16 NOTE — Progress Notes (Signed)
 Specialty Pharmacy Refill Coordination Note  Monique Clark is a 58 y.o. female contacted today regarding refills of specialty medication(s) Sarilumab  (Kevzara )   Patient requested Delivery   Delivery date: 05/18/24   Verified address: [address]  [place] [address]   Medication will be filled on 05/17/24.

## 2024-05-17 ENCOUNTER — Ambulatory Visit: Admitting: Physical Therapy

## 2024-05-17 ENCOUNTER — Other Ambulatory Visit: Payer: Self-pay

## 2024-05-17 MED ORDER — GABAPENTIN 300 MG PO CAPS: ORAL_CAPSULE | ORAL | 0 refills | Status: DC

## 2024-05-18 ENCOUNTER — Telehealth: Payer: Self-pay

## 2024-05-18 NOTE — Progress Notes (Unsigned)
 SABRA

## 2024-05-18 NOTE — Telephone Encounter (Signed)
 Please see MyChart message from today 05/18/2024

## 2024-05-18 NOTE — Progress Notes (Unsigned)
 PATIENT: Monique Clark DOB: [birth year]  REASON FOR VISIT: follow up HISTORY FROM: patient  No chief complaint on file.    HISTORY OF PRESENT ILLNESS:  05/18/24 ALL:  Atalie returns for follow up for OSA on CPAP.     05/20/2023 ALL:  Monique Clark is a 58 y.o. female here today for follow up for OSA on CPAP.  She reports doing well. She is using therapy most every night but admits that four hour usage is a little low. She denies concerns with machine or supplies. She is using a small P10 nasal pillow. She changes supplies regularly. She has had more headaches, recently. Unclear why. She described a tension style headache that is usually present in the mornings. Rest usually helps. She has not discussed with PCP. She drinks mostly caffeinated tea. She continues Ambien  per PCP.     HISTORY: (copied from Dr Dohmeier's previous note)  Myalynn Lingle is a 58 y.o. Caucasian female patient seen here as a referral on 05/19/2022 . She underwent an in lab SPLIT night study and this resulted in her being titrated to CPAP.  The study took place on 18 Feb 2022, the patient had endorsed the Epworth Sleepiness Scale at 12 out of 24 points before she slept 2 hours with an AHI of 37/h and reached split night protocol.  She did not sleep in supine.  Oxygen  nadir was 86% saturation there were 21 minutes of low oxygen  saturation was in 120 minutes of baseline study.  No significant PLM's.  There were some spontaneous arousals the patient was titrated with a small P10 nasal pillow and was able to achieve a reduction of her apnea-hypopnea index to 3.4/h while being on only 5 cmH2O.   She is presenting here today about 3 weeks post gastric sleeve surgery she has been 97% compliant by days and 86% compliant by hours with an average use of 6 hours 13 minutes at night the auto titration CPAP machine is set between 5 and 10 cm water with 3 cm EPR and her 95th percentile pressure is 8.7/h the residual AHI  is 1.4/h which is a good resolution.  There is no significant air leakage noted.  There are no central apneas emerging either.  So overall this looks like the patient is able to reduce her apnea index very well she has also already lost about 20 pounds over the last 3 weeks which will have helped I see a trend on her curve of AHI that is clearly going downwards.  Her Epworth sleepiness score was still endorsed slightly elevated today at 11 out of 24 points.  She did not endorse the fatigue severity score.  The patient preferred to sleep prone but she cannot do with a nasal pillow.  I reviewed all her sleep study data the patient was able to reach REM sleep right after CPAP was initiated and able to maintain REM sleep for almost 60 minutes.   She feels less fatigued, but sleepiness has been the same.  Nocturia 1-2 times at night as before.  No need to continue Ambien  as a sleep aid- she had been on it before seeing me.  Prescriber is PCP, Dr Rollene.   REVIEW OF SYSTEMS: Out of a complete 14 system review of symptoms, the patient complains only of the following symptoms, headaches and all other reviewed systems are negative.  ESS: 4/24  ALLERGIES: Allergies  Allergen Reactions   Chlorhexidine  Rash   Tetanus-Diphtheria Toxoids Td Swelling  angioedema   Nizatidine Hives    Axid   Promethazine  Other (See Comments)    High doses causes confusion   Tetanus Toxoids Other (See Comments)    angioedema    HOME MEDICATIONS: Outpatient Medications Prior to Visit  Medication Sig Dispense Refill   calcium  carbonate (TUMS) 500 MG chewable tablet Chew 1 tablet by mouth 3 (three) times daily. (Patient taking differently: Chew 1 tablet by mouth as needed.)     Cholecalciferol  (VITAMIN D3) 1.25 MG (50000 UT) CAPS Take 1 capsule (1.25 mg total) by mouth 2 (two) times a week as directed 8 capsule 5   cyclobenzaprine  (FLEXERIL ) 5 MG tablet Take 1 tablet (5 mg total) by mouth at bedtime. 30 tablet 5    diclofenac  Sodium (VOLTAREN  ARTHRITIS PAIN) 1 % GEL Apply 4 g topically 4 (four) times daily.     diphenhydrAMINE  (BENADRYL ) 25 MG tablet Take 25-50 mg by mouth See admin instructions. Take 25 mg by mouth as needed for allergies or itching and take 50 mg by mouth with Hizentra  infusion (Patient taking differently: Take 25-50 mg by mouth as needed. Take 25 mg by mouth as needed for allergies or itching and take 50 mg by mouth with Hizentra  infusion)     docusate sodium  (COLACE) 100 MG capsule Take 200 mg by mouth at bedtime.     DULoxetine  (CYMBALTA ) 60 MG capsule Take 1 capsule (60 mg total) by mouth daily. 90 capsule 2   famotidine  (PEPCID ) 20 MG tablet Take 20 mg by mouth daily as needed for heartburn or indigestion.     flecainide  (TAMBOCOR ) 100 MG tablet Take 1 tablet (100 mg total) by mouth every 12 (twelve) hours. 180 tablet 1   gabapentin  (NEURONTIN ) 300 MG capsule Start with 300 mg twice daily, then after 1 week if pain remains severe increase to 3 times daily. 90 capsule 0   Immune Globulin , Human, (HIZENTRA ) 4 GM/20ML SOLN 36 grams (180ml) subcutaneously every 2 weeks via pump 360 mL 11   lidocaine  (LIDODERM ) 5 % Place 1 patch onto the skin daily. Remove & Discard patch within 12 hours or as directed by MD 30 patch 5   methocarbamol  (ROBAXIN ) 500 MG tablet Take 1 tablet (500 mg total) by mouth every 6 (six) hours as needed for muscle spasms. (Patient not taking: Reported on 05/09/2024) 30 tablet 0   Multiple Vitamin (MULTIVITAMIN WITH MINERALS) TABS tablet Take 1 tablet by mouth at bedtime.     nebivolol  (BYSTOLIC ) 5 MG tablet Take 1 tablet (5 mg total) by mouth daily. 30 tablet 5   NON FORMULARY Pt uses a cpap nightly w/2 liters of oxygen      nystatin  cream (MYCOSTATIN ) Apply 1 Application topically 2 (two) times daily. (Patient not taking: Reported on 05/09/2024) 30 g 0   oxyCODONE -acetaminophen  (PERCOCET) 5-325 MG tablet Take 1 tablet by mouth 2 (two) times daily as needed for moderate pain  (pain score 4-6). 60 tablet 0   rivaroxaban  (XARELTO ) 20 MG TABS tablet Take 1 tablet (20 mg total) by mouth daily. 90 tablet 4   Sarilumab  (KEVZARA ) 200 MG/1. SOAJ Inject 200 mg into the skin every 14 (fourteen) days. 2.28 mL 2   tirzepatide  (ZEPBOUND ) 2.5 MG/0.5ML injection vial 5 mg once a week.     zolpidem  (AMBIEN ) 5 MG tablet TAKE 1 TABLET BY MOUTH AT BEDTIME AS NEEDED FOR SLEEP. 30 tablet 5   No facility-administered medications prior to visit.    PAST MEDICAL HISTORY: Past Medical History:  Diagnosis Date   Abscess    Arthritis    Asthma    November 2023- hospitalized   Atrial fibrillation [facility])    Cardiac arrhythmia due to congenital heart disease    trace MR 08/15/20 echo ([place])- pt denies congenital heart disease   Cellulitis 03/2024   Cervical lymphadenopathy    CVID (common variable immunodeficiency) (HCC) 2015   takes Hizentra    Diverticulosis    Elevated cholesterol    GERD (gastroesophageal reflux disease)    Headache    High blood pressure    Non Hodgkin's lymphoma (HCC) 1998   Obesity    Personal history of gestational diabetes    PMR (polymyalgia rheumatica) (HCC)    takes Kevzara    Pneumonia    PONV (postoperative nausea and vomiting)    POTS (postural orthostatic tachycardia syndrome)    Pre-diabetes    Sleep apnea    CPAP with 2L O2   UTI (urinary tract infection)    UTI (urinary tract infection) 03/2024    PAST SURGICAL HISTORY: Past Surgical History:  Procedure Laterality Date   ABDOMINAL HYSTERECTOMY     APPENDECTOMY     CESAREAN SECTION     CHOLECYSTECTOMY     COLONOSCOPY     HEMATOMA EVACUATION N/A 03/04/2023   Procedure: EVACUATION HEMATOMA SEROMA WITH RECLOSURE LUMBAR INCISION;  Surgeon: Barbarann Oneil BROCKS, MD;  Location: [facility] OR;  Service: Orthopedics;  Laterality: N/A;   JOINT REPLACEMENT Left    hip   JOINT REPLACEMENT Right    Right Hip 2022   LAPAROSCOPIC GASTRIC SLEEVE RESECTION  04/29/2022   left shoulder repair      rotator cuff repair   LUMBAR LAMINECTOMY N/A 09/18/2021   Procedure: RIGHT LUMBAR FOUR-FIVE MICRODISCECTOMY;  Surgeon: Barbarann Oneil BROCKS, MD;  Location: [facility] OR;  Service: Orthopedics;  Laterality: N/A;   LUMBAR LAMINECTOMY/DECOMPRESSION MICRODISCECTOMY N/A 02/27/2023   Procedure: L4-5 LUMBAR DECOMPRESSION, MICRODISCECTOMY;  Surgeon: Barbarann Oneil BROCKS, MD;  Location: [facility] OR;  Service: Orthopedics;  Laterality: N/A;   LUNG REMOVAL, PARTIAL Right 2009   VATS, wedge resection partial lobectomy, done at [facility]   myringostomy Bilateral    Myringotomy   SPLENECTOMY, TOTAL  1998   TONSILLECTOMY AND ADENOIDECTOMY     UPPER GI ENDOSCOPY N/A 04/29/2022   Procedure: UPPER GI ENDOSCOPY;  Surgeon: Kinsinger, Herlene Righter, MD;  Location: [facility] ORS;  Service: General;  Laterality: N/A;    FAMILY HISTORY: Family History  Problem Relation Age of Onset   Atrial fibrillation Mother    Colon cancer Father    Cancer Father        Colon   Colon cancer Sister    Basal cell carcinoma Brother    Atrial fibrillation Maternal Uncle    Atrial fibrillation Maternal Grandmother    Atrial fibrillation Maternal Grandfather    Heart attack Maternal Grandfather    Ovarian cancer Neg Hx    Stomach cancer Neg Hx    Rectal cancer Neg Hx     SOCIAL HISTORY: Social History   Socioeconomic History   Marital status: Married    Spouse name: Not on file   Number of children: 3   Years of education: Not on file   Highest education level: Doctorate  Occupational History   Occupation: rn  Tobacco Use   Smoking status: Former    Current packs/day: 0.00    Average packs/day: 0.5 packs/day for 20.0 years (10.0 ttl pk-yrs)    Types: Cigarettes    Start date:  09/29/1982    Quit date: 09/29/2002    Years since quitting: 21.6    Passive exposure: Current   Smokeless tobacco: Never  Vaping Use   Vaping status: Never Used  Substance and Sexual Activity   Alcohol use: Yes    Comment: once a month- rarely   Drug use: No    Sexual activity: Yes    Birth control/protection: Surgical, Post-menopausal    Comment: 1st intercourse 26 yo-5 partners  Other Topics Concern   Not on file  Social History Narrative   Not on file   Social Drivers of Health   Financial Resource Strain: Low Risk  (03/13/2023)   Overall Financial Resource Strain (CARDIA)    Difficulty of Paying Living Expenses: Not very hard  Food Insecurity: No Food Insecurity (03/13/2023)   Hunger Vital Sign    Worried About Running Out of Food in the Last Year: Never true    Ran Out of Food in the Last Year: Never true  Transportation Needs: No Transportation Needs (03/13/2023)   PRAPARE - Administrator, Civil Service (Medical): No    Lack of Transportation (Non-Medical): No  Physical Activity: Unknown (03/13/2023)   Exercise Vital Sign    Days of Exercise per Week: 0 days    Minutes of Exercise per Session: Not on file  Stress: Stress Concern Present (03/13/2023)   [employer] of Occupational Health - Occupational Stress Questionnaire    Feeling of Stress : To some extent  Social Connections: Moderately Isolated (03/13/2023)   Social Connection and Isolation Panel    Frequency of Communication with Friends and Family: Twice a week    Frequency of Social Gatherings with Friends and Family: Once a week    Attends Religious Services: Never    Database administrator or Organizations: No    Attends Engineer, structural: Not on file    Marital Status: Married  Catering manager Violence: Not At Risk (08/17/2022)   Humiliation, Afraid, Rape, and Kick questionnaire    Fear of Current or Ex-Partner: No    Emotionally Abused: No    Physically Abused: No    Sexually Abused: No     PHYSICAL EXAM  There were no vitals filed for this visit.  There is no height or weight on file to calculate BMI.  Generalized: Well developed, in no acute distress  Cardiology: normal rate and rhythm, no murmur noted Respiratory: clear to  auscultation bilaterally  Neurological examination  Mentation: Alert oriented to time, place, history taking. Follows all commands speech and language fluent Cranial nerve II-XII: Pupils were equal round reactive to light. Extraocular movements were full, visual field were full  Motor: The motor testing reveals 5 over 5 strength of all 4 extremities. Good symmetric motor tone is noted throughout.  Gait and station: Gait is arthritic, stable without assistive device     DIAGNOSTIC DATA (LABS, IMAGING, TESTING) - I reviewed patient records, labs, notes, testing and imaging myself where available.      No data to display           Lab Results  Component Value Date   WBC 9.1 05/09/2024   HGB 13.2 05/09/2024   HCT 40.9 05/09/2024   MCV 92.3 05/09/2024   PLT 278 05/09/2024      Component Value Date/Time   NA 139 05/09/2024 1149   K 4.8 05/09/2024 1149   CL 103 05/09/2024 1149   CO2 29 05/09/2024 1149   GLUCOSE 84  05/09/2024 1149   BUN 17 05/09/2024 1149   CREATININE 0.78 05/09/2024 1149   CALCIUM  9.5 05/09/2024 1149   PROT 6.6 05/09/2024 1149   ALBUMIN 3.6 08/16/2022 1632   AST 23 05/09/2024 1149   ALT 16 05/09/2024 1149   ALKPHOS 70 08/16/2022 1632   BILITOT 0.4 05/09/2024 1149   GFRNONAA >60 05/16/2023 2258   GFRAA >60 03/18/2020 1727   Lab Results  Component Value Date   CHOL 231 (H) 09/14/2023   HDL 68 09/14/2023   LDLCALC 146 (H) 09/14/2023   TRIG 73 09/14/2023   CHOLHDL 3.4 09/14/2023   Lab Results  Component Value Date   HGBA1C 5.5 04/16/2022   No results found for: VITAMINB12 Lab Results  Component Value Date   TSH 1.430 03/13/2019     ASSESSMENT AND PLAN 58 y.o. year old female  has a past medical history of Abscess, Arthritis, Asthma, Atrial fibrillation (HCC), Cardiac arrhythmia due to congenital heart disease, Cellulitis (03/2024), Cervical lymphadenopathy, CVID (common variable immunodeficiency) (HCC) (2015), Diverticulosis, Elevated  cholesterol, GERD (gastroesophageal reflux disease), Headache, High blood pressure, Non Hodgkin's lymphoma (HCC) (1998), Obesity, Personal history of gestational diabetes, PMR (polymyalgia rheumatica) (HCC), Pneumonia, PONV (postoperative nausea and vomiting), POTS (postural orthostatic tachycardia syndrome), Pre-diabetes, Sleep apnea, UTI (urinary tract infection), and UTI (urinary tract infection) (03/2024). here with   No diagnosis found.    Acelynn Maranan is doing well on CPAP therapy. Compliance report reveals excellent daily but sub optimal four hour usage. She was encouraged to continue using CPAP nightly and for greater than 4 hours each night. We will update supply orders as indicated. Risks of untreated sleep apnea review and education materials provided. Possible causes of headaches discussed. Educational material provided. She was advised to discuss with PCP if increasing four hour usage does not help. Healthy lifestyle habits encouraged. She will follow up in 1 year, sooner if needed. She verbalizes understanding and agreement with this plan.    No orders of the defined types were placed in this encounter.    No orders of the defined types were placed in this encounter.     Greig Forbes, FNP-C 05/18/2024, 11:01 AM [facility] [address], [address] [place], [place] [address] [phone]

## 2024-05-19 ENCOUNTER — Encounter: Payer: Self-pay | Admitting: Family Medicine

## 2024-05-19 ENCOUNTER — Ambulatory Visit (INDEPENDENT_AMBULATORY_CARE_PROVIDER_SITE_OTHER): Payer: Commercial Managed Care - PPO | Admitting: Family Medicine

## 2024-05-19 VITALS — BP 128/83 | HR 66 | Ht 65.0 in | Wt 301.0 lb

## 2024-05-19 DIAGNOSIS — G4733 Obstructive sleep apnea (adult) (pediatric): Secondary | ICD-10-CM | POA: Diagnosis not present

## 2024-05-19 NOTE — Patient Instructions (Signed)

## 2024-05-24 ENCOUNTER — Ambulatory Visit: Attending: Registered Nurse

## 2024-05-24 ENCOUNTER — Other Ambulatory Visit: Payer: Self-pay

## 2024-05-24 DIAGNOSIS — M79671 Pain in right foot: Secondary | ICD-10-CM | POA: Diagnosis not present

## 2024-05-24 DIAGNOSIS — M6281 Muscle weakness (generalized): Secondary | ICD-10-CM | POA: Diagnosis present

## 2024-05-24 DIAGNOSIS — M25512 Pain in left shoulder: Secondary | ICD-10-CM | POA: Diagnosis not present

## 2024-05-24 DIAGNOSIS — R2689 Other abnormalities of gait and mobility: Secondary | ICD-10-CM | POA: Diagnosis present

## 2024-05-24 DIAGNOSIS — G894 Chronic pain syndrome: Secondary | ICD-10-CM | POA: Diagnosis not present

## 2024-05-24 DIAGNOSIS — G8929 Other chronic pain: Secondary | ICD-10-CM | POA: Insufficient documentation

## 2024-05-24 DIAGNOSIS — M79672 Pain in left foot: Secondary | ICD-10-CM | POA: Diagnosis not present

## 2024-05-24 DIAGNOSIS — M545 Low back pain, unspecified: Secondary | ICD-10-CM | POA: Diagnosis not present

## 2024-05-24 NOTE — Therapy (Signed)
 OUTPATIENT PHYSICAL THERAPY WHEELCHAIR EVALUATION   Patient Name: Saniah Schroeter MRN: [id number] DOB:[birth year], 58 y.o., female Today's Date: 05/24/2024  END OF SESSION:  PT End of Session - 05/24/24 1155     Visit Number 1    Number of Visits 1    PT Start Time 1155    PT Stop Time 1235    PT Time Calculation (min) 40 min    Equipment Utilized During Treatment Gait belt    Activity Tolerance Patient tolerated treatment well    Behavior During Therapy [facility] for tasks assessed/performed          Past Medical History:  Diagnosis Date   Abscess    Arthritis    Asthma    November 2023- hospitalized   Atrial fibrillation (HCC)    Cardiac arrhythmia due to congenital heart disease    trace MR 08/15/20 echo ([place])- pt denies congenital heart disease   Cellulitis 03/2024   Cervical lymphadenopathy    CVID (common variable immunodeficiency) (HCC) 2015   takes Hizentra    Diverticulosis    Elevated cholesterol    GERD (gastroesophageal reflux disease)    Headache    High blood pressure    Non Hodgkin's lymphoma (HCC) 1998   Obesity    Personal history of gestational diabetes    PMR (polymyalgia rheumatica) (HCC)    takes Kevzara    Pneumonia    PONV (postoperative nausea and vomiting)    POTS (postural orthostatic tachycardia syndrome)    Pre-diabetes    Sleep apnea    CPAP with 2L O2   UTI (urinary tract infection)    UTI (urinary tract infection) 03/2024   Past Surgical History:  Procedure Laterality Date   ABDOMINAL HYSTERECTOMY     APPENDECTOMY     CESAREAN SECTION     CHOLECYSTECTOMY     COLONOSCOPY     HEMATOMA EVACUATION N/A 03/04/2023   Procedure: EVACUATION HEMATOMA SEROMA WITH RECLOSURE LUMBAR INCISION;  Surgeon: Barbarann Oneil BROCKS, MD;  Location: [facility] OR;  Service: Orthopedics;  Laterality: N/A;   JOINT REPLACEMENT Left    hip   JOINT REPLACEMENT Right    Right Hip 2022   LAPAROSCOPIC GASTRIC SLEEVE RESECTION  04/29/2022   left shoulder  repair     rotator cuff repair   LUMBAR LAMINECTOMY N/A 09/18/2021   Procedure: RIGHT LUMBAR FOUR-FIVE MICRODISCECTOMY;  Surgeon: Barbarann Oneil BROCKS, MD;  Location: [facility] OR;  Service: Orthopedics;  Laterality: N/A;   LUMBAR LAMINECTOMY/DECOMPRESSION MICRODISCECTOMY N/A 02/27/2023   Procedure: L4-5 LUMBAR DECOMPRESSION, MICRODISCECTOMY;  Surgeon: Barbarann Oneil BROCKS, MD;  Location: [facility] OR;  Service: Orthopedics;  Laterality: N/A;   LUNG REMOVAL, PARTIAL Right 2009   VATS, wedge resection partial lobectomy, done at [facility]   myringostomy Bilateral    Myringotomy   SPLENECTOMY, TOTAL  1998   TONSILLECTOMY AND ADENOIDECTOMY     UPPER GI ENDOSCOPY N/A 04/29/2022   Procedure: UPPER GI ENDOSCOPY;  Surgeon: Stevie Herlene Righter, MD;  Location: [facility];  Service: General;  Laterality: N/A;   Patient Active Problem List   Diagnosis Date Noted   Chronic pain syndrome 03/28/2024   Encounter for opiate analgesic use agreement 03/28/2024   Encounter for medication management 03/28/2024   Chronic bilateral low back pain with left-sided sciatica 03/28/2024   Pain in left ankle and joints of left foot 11/24/2023   Candida esophagitis (HCC) 11/02/2023   Exposure to the flu 11/02/2023   Encounter for general adult medical examination with abnormal findings  03/17/2023   Recurrent fracture of lumbar vertebra (HCC) 03/01/2023   History of lumbar laminectomy for spinal cord decompression 02/27/2023   Seronegative rheumatoid arthritis (HCC) 11/17/2022   Nocturnal hypoxemia 08/29/2022   Pulmonary nodule 08/16/2022   Recurrent herniation of lumbar disc 07/25/2022   OSA on CPAP 05/19/2022   Impingement syndrome of left shoulder 05/13/2022   Leukocytosis 01/20/2022   Morbid obesity (HCC) 01/01/2022   Chronic fatigue and immune dysfunction syndrome (HCC) 01/01/2022   High risk medication use 12/02/2021   PMR (polymyalgia rheumatica) (HCC) 11/05/2021   Status post lumbar spine operative procedure for decompression  of spinal cord 09/27/2021   Insomnia 05/28/2021   COVID-19 05/07/2021   Malignant (primary) neoplasm, unspecified (HCC) 03/08/2021   Personal history of urinary (tract) infections 03/08/2021   Personal history of pneumonia (recurrent) 03/08/2021   Elevated serum hCG 07/02/2020   POTS (postural orthostatic tachycardia syndrome) 03/08/2020   Mixed hyperlipidemia 03/21/2019   Paroxysmal atrial fibrillation (HCC)    Localized swelling of left lower extremity    Essential hypertension    Mitral valve regurgitation 06/22/2017   CVID (common variable immunodeficiency) (HCC) 05/07/2015   Prediabetes 01/20/2013   History of non-Hodgkin's lymphoma 12/21/2012    PCP: Dr. Almarie Cleveland  REFERRING PROVIDER: Fidela Ned, NP  THERAPY DIAG:  Muscle weakness (generalized) - Plan: PT plan of care cert/re-cert  Other abnormalities of gait and mobility - Plan: PT plan of care cert/re-cert  Rationale for Evaluation and Treatment Rehabilitation  SUBJECTIVE:                                                                                                                                                                                           SUBJECTIVE STATEMENT: Pt presents for wheelchair evaluation. Pt has bil hip replacement, microdiscectomy L2 and L4, bil DJD in bil knees, ankles and feet, Polymyalgia rheumatica with hx of bil shoulder pain. Pt also has PMH of POTS  PRECAUTIONS: Fall  RED FLAGS: None  WEIGHT BEARING RESTRICTIONS No    OCCUPATION: Care management, administrative job, Work has provided scooter that she can use while at work only  PLOF:  Needs assistance with ADLs, Needs assistance with homemaking, Needs assistance with gait, and Needs assistance with transfers  PATIENT GOALS: obtain power chair         MEDICAL HISTORY:  Primary diagnosis onset: 04/22/24     Medical Diagnosis with ICD-10 code: M25.512,G89.29 (ICD-10-CM) - Chronic left shoulder pain  G89.4  (ICD-10-CM) - Chronic pain syndrome  M54.50,G89.29 (ICD-10-CM) - Chronic bilateral low back pain without sciatica  M79.671,M79.672 (ICD-10-CM) - Bilateral foot pain     []   Progressive disease  Relevant future surgeries:     Height: 5' 5 Weight: 298 lbs Explain recent changes or trends in weight:      History:  Past Medical History:  Diagnosis Date   Abscess    Arthritis    Asthma    November 2023- hospitalized   Atrial fibrillation (HCC)    Cardiac arrhythmia due to congenital heart disease    trace MR 08/15/20 echo ([place])- pt denies congenital heart disease   Cellulitis 03/2024   Cervical lymphadenopathy    CVID (common variable immunodeficiency) (HCC) 2015   takes Hizentra    Diverticulosis    Elevated cholesterol    GERD (gastroesophageal reflux disease)    Headache    High blood pressure    Non Hodgkin's lymphoma (HCC) 1998   Obesity    Personal history of gestational diabetes    PMR (polymyalgia rheumatica) (HCC)    takes Kevzara    Pneumonia    PONV (postoperative nausea and vomiting)    POTS (postural orthostatic tachycardia syndrome)    Pre-diabetes    Sleep apnea    CPAP with 2L O2   UTI (urinary tract infection)    UTI (urinary tract infection) 03/2024       Cardio Status:  Functional Limitations:   [] Intact  [x]  Impaired    A-fib  Respiratory Status:  Functional Limitations:   [] Intact  [] Impaired   [x] SOB [] COPD [x] O2 Dependent ___2 ___LPM  [] Ventilator Dependent  Resp equip:       Pt uses 2 L of O2 at night                                              Objective Measure(s):   Orthotics:   [] Amputee:                                                             [] Prosthesis:        HOME ENVIRONMENT:  [x] House [] Condo/town home [] Apartment [] Asst living [] LTCF         [x] Own  [] Rent   [] Lives alone [] Lives with others -  husband, daughter , 36 yr old                        Hours without assistance: 6 hours  [x] Home is accessible to patient                                  Storage of wheelchair:  [x] In home   [] Other Comments:        COMMUNITY :  TRANSPORTATION:  [x] Car [] Sales promotion account executive [] Adapted w/c Lift []  Ambulance [] Other:                     [] Sits in wheelchair during transport   Where is w/c stored during transport?  [] [employer]  []  [employer]  r   [] Self-Driver       Drive while in  Biomedical scientist [] yes [x] no   Employment and/or school:  Specific requirements pertaining to mobility: Pt works in Teacher, music. Pt has  to walk >2000 feet from handicap parking to her office. Pt is currently provided temporay scooter to be able to use at work only       Other:  COMMUNICATION:  Verbal Communication  [x] WFL [] receptive [] WFL [] expressive [] Understandable  [] Difficult to understand  [] non-communicative  Primary Language:____English__________ 2nd:_____________  Communication provided by:[x] Patient [] Family [] Caregiver [] Translator   [] Uses an Paramedic device     Manufacturer/Model :                                                                MOBILITY/BALANCE:  Sitting Balance  Standing Balance  Transfers  Ambulation   [x] WFL      [] WFL  [] Independent  []  Independent   [] Uses UE for balance in sitting Comments:  [x] Uses UE/device for stability Comments: Unable to stand for 1-2 minutes without significant pain in bil hips, knees, and lower back [x]  Min assist - ueses cane, furniture surfaces, walker for transfers []  Ambulates independently with       device:___________________      []  Mod assist  [x]  Able to ambulate ___50___ feet        safely/functionally/independently   []  Min assist  []  Min assist  []  Max assist  []  Non-functional ambulator         History/High risk of falls   []  Mod assist  []  Mod assist  []  Dependent  []  Unable to ambulate   []  Max  assist  []  Max assist  Transfer method:[] 1 person [] 2 person [] sliding board [] squat pivot [x] stand pivot [] mechanical  patient lift  [] other:   []  Unable  []  Unable    Fall History: # of falls in the past 6 months? 0 # of "near" falls in the past 6 months? 1-2x/week    CURRENT SEATING / MOBILITY:  Current Mobility Device: [] None [x] Cane/Walker [] Manual [] Dependent [] Dependent w/ Tilt rScooter  [] Power (type of control):   Manufacturer:  Model:  Serial #:   Size:  Color:  Age:   Purchased by whom:   Current condition of mobility base:    Current seating system:                                                                       Age of seating system:    Describe posture in present seating system:    Is the current mobility meeting medical necessity?:  [] Yes [x] No Describe: Due to significant pain in back, bil hips, bil shoulders, bil knees- pt unable to stand/walk for any functional amount of time which impact her independence and ability to perform work activities. Pt has high BMI which will make pushing manual wheelchair very difficult considering bil shoulder pain.                                    Ability to complete Mobility-Related Activities of Daily Living (MRADL's) with Current Mobility Device:   Move room to room  [] Independent  [  x]Min [] Mod [] Max assist  [] Unable  Comments: shower chair for bathing and daughter helps with bathing, needs mod A from husband with bil LE dressing  Meal prep  [] Independent  [] Min [] Mod [x] Max assist  [] Unable    Feeding  [x] Independent  [] Min [] Mod [] Max assist  [] Unable    Bathing  [] Independent  [x] Min [] Mod [] Max assist  [] Unable    Grooming  [x] Independent  [] Min [] Mod [] Max assist  [] Unable    UE dressing  [] Independent  [x] Min [] Mod [] Max assist  [] Unable    LE dressing  [] Independent   [] Min [x] Mod [] Max assist  [] Unable    Toileting  [x] Independent  [] Min [] Mod [] Max assist  [] Unable    Bowel Mgt: [x]  Continent []  Incontinent []  Accidents []  Diapers []  Colostomy []  Bowel Program:  Bladder Mgt: [x]  Continent []  Incontinent []  Accidents []  Diapers []   Urinal []  Intermittent Cath []  Indwelling Cath []  Supra-pubic Cath     Current Mobility Equipment Trialed/ Ruled Out:    Does not meet mobility needs due to:    Mark all boxes that indicate inability to use the specific equipment listed     Meets needs for safe  independent functional  ambulation  / mobility    Risk of  Falling or History of Falls    Enviromental limitations      Cognition    Safety concerns with  physical ability    Decreased / limitations endurance  & strength     Decreased / limitations  motor skills  & coordination    Pain    Pace /  Speed    Cardiac and/or  respiratory condition    Contra - indicated by diagnosis   Cane/Crutches  []   [x]   [x]   []   [x]   [x]   [x]   [x]   [x]   []   []    Walker / Rollator  []  NA   []   [x]   [x]   []   [x]   [x]   [x]   [x]   [x]   []   []     Manual Wheelchair [id number]:  []  NA  []   []   []   []   [x]   [x]   [x]   [x]   [x]   []   []    Manual W/C (K0005) with power assist  []  NA  []   []   []   []   []   []   []   []   []   []   []    Scooter  []  NA  []   []   []   []   []   []   []   []   []   []   []    Power Wheelchair: standard joystick  []  NA  [x]   []   []   []   []   []   []   []   []   []   []    Power Wheelchair: alternative controls  []  NA  []   []   []   []   []   []   []   []   []   []   []    Summary:  The least costly alternative for independent functional mobility was found to be:    []  Crutch/Cane  []  Walker []  Manual w/c  []  Manual w/c with power assist   []  Scooter   [x]  Power w/c std joystick   []  Power w/c alternative control        []  Requires dependent care mobility device   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 for safe mobility equipment operation  [x]   Yes []   No  Patient is willing and motivated to use  recommended mobility equipment  [x]   Yes []   No       []  Patient is unable to safely operate mobility equipment independently and requires dependent care equipment Comments:           SENSATION and SKIN ISSUES:   Sensation [x]  Intact  []  Impaired []  Absent []  Hyposensate []  Hypersensate  []  Defensiveness  Location(s) of impairment:    Pressure Relief Method(s):  []  Lean side to side to offload (without risk of falling)  []   W/C push up (4+ times/hour for 15+ seconds) [x]  Stand up (without risk of falling)    []  Other: (Describe): Effective pressure relief method(s) above can be performed consistently throughout the day: [x] Yes  []  No If not, Why?:  Skin Integrity Risk:       [x]  Low risk           []  Moderate risk            []  High risk  If high risk, explain:   Skin Issues/Skin Integrity  Current skin Issues  []  Yes [x]  No []  Intact  []   Red area   []   Open area  []  Scar tissue  []  At risk from prolonged sitting  Where: History of Skin Issues  []  Yes [x]  No Where : When: Stage: Hx of skin flap surgeries  []  Yes [x]  No Where:  When:  Pain: [x]  Yes []  No   Pain Location(s): lower back pain, bil hips, bil knees, bil shoulders, bil feet Intensity scale: (0-10) :8-9/10 How does pain interfere with mobility and/or MRADLs? - difficulty with prolonged sitting, standing        MAT EVALUATION:  Neuro-Muscular Status: (Tone, Reflexive, Responses, etc.)     [x]   Intact   []  Spasticity:  []  Hypotonicity  []  Fluctuating  []  Muscle Spasms  []  Poor Righting Reactions/Poor Equilibrium Reactions  []  Primal Reflex(s):    Comments:            COMMENTS:    POSTURE:     Comments:  Pelvis Anterior/Posterior:  []  Neutral   [x]  Posterior  []  Anterior  []  Fixed - No movement []  Tendency away from neutral [x]  Flexible []  Self-correction []  External correction Obliquity (viewed from front)  [x]  WFL []  R Obliquity []  L Obliquity  []  Fixed - No movement []  Tendency away from neutral [x]  Flexible []  Self-correction []  External correction Rotation  [x]  WFL []  R anterior []  L anterior  []  Fixed - No movement []  Tendency away from neutral [x]  Flexible []   Self-correction []  External correction Tonal Influence Pelvis:  [x]  Normal []  Flaccid []  Low tone []  Spasticity []  Dystonia []  Pelvis thrust []  Other:    Trunk Anterior/Posterior:  [x]  WFL [x]  Thoracic kyphosis []  Lumbar lordosis  []  Fixed - No movement []  Tendency away from neutral [x]  Flexible []  Self-correction []  External correction  [x]  WFL []  Convex to left  []  Convex to right []  S-curve   []  C-curve []  Multiple curves []  Tendency away from neutral []  Flexible []  Self-correction []  External correction Rotation of shoulders and upper trunk:  [x]  Neutral []  Left-anterior []  Right- anterior []  Fixed- no movement []  Tendency away from neutral []  Flexible []  Self correction []  External correction Tonal influence Trunk:  [x]  Normal []  Flaccid []  Low tone []  Spasticity []  Dystonia []  Other:   Head & Neck  [x]  Functional []  Flexed    []  Extended []  Rotated right  []  Rotated left []  Laterally flexed  right []  Laterally flexed left []  Cervical hyperextension   [x]  Good head control []  Adequate head control []  Limited head control []  Absent head control Describe tone/movement of head and neck:      Lower Extremity Measurements: LE MMT  LE MMT Right 05/24/2024 Left 05/24/2024  Hip flexion 4 4  Hip extension    Hip abduction 3+ 3+  Hip adduction    Knee flexion 4 4  Knee extension 4 4  Ankle dorsiflexion 4 4  Ankle plantarflexion     (Blank rows = not tested)   Hip positions:  []  Neutral   [x]  Abducted   []  Adducted  []  Subluxed   []  Dislocated   []  Fixed   []  Tendency away from neutral [x]  Flexible [x]  Self-correction []  External correction   Hip Windswept:[x]  Neutral  []  Right    []  Left  []  Subluxed   []  Dislocated   []  Fixed   []  Tendency away from neutral [x]  Flexible [x]  Self-correction []  External correction  LE Tone: [x]  Normal []  Low tone []  Spasticity []  Flaccid []  Dystonia []  Rocks/Extends at hip []  Thrust into knee  extension []  Pushes legs downward into footrest  Foot positioning: ROM Concerns: Dorsiflexed: []  Right   []  Left Plantar flexed: []  Right    []  Left Inversion: []  Right    []  Left Eversion: []  Right    []  Left  LE Edema: []  1+ (Barely detectable impression when finger is pressed into skin) [x]  2+ (slight indentation. 15 seconds to rebound) []  3+ (deeper indentation. 30 seconds to rebound) []  4+ (>30 seconds to rebound)  UE Measurements:  UPPER EXTREMITY ROM:   Active ROM Right 05/24/2024 Left 05/24/2024  Shoulder flexion 150 120 pain  Shoulder abduction 150 100 pain  Shoulder adduction    Elbow flexion WF:L WFL  Elbow extension [facility] WFL  Wrist flexion    Wrist extension    (Blank rows = not tested)  UPPER EXTREMITY MMT:  MMT Right 05/24/2024 Left 05/24/2024  Shoulder flexion 5 3+ pain  Shoulder abduction 5 3+ pain  Shoulder adduction    Elbow flexion 5 5  Elbow extension 5 5  Wrist flexion    Wrist extension    Pinch strength    Grip strength 43 lbs 49 lbs  (Blank rows = not tested)  Shoulder Posture:  Right Tendency towards Left  [x]   Functional [x]    []   Elevation []    []   Depression []    []   Protraction []    []   Retraction []    []   Internal rotation []    []   External rotation []    []   Subluxed []     UE Tone: [x]  Normal []  Flaccid []  Low tone []  Spasticity  []  Dystonia []  Other:   UE Edema: [x]  1+ (Barely detectable impression when finger is pressed into skin) []  2+ (slight indentation. 15 seconds to rebound) []  3+ (deeper indentation. 30 seconds to rebound) []  4+ (>30 seconds to rebound)  Wrist/Hand: Handedness: [x]  Right   []  Left   []  NA: Comments:  Right  Left  [x]   WNL [x]    []   Limitations []    []   Contractures []    []   Fisting []    []   Tremors []    []   Weak grasp []    []   Poor dexterity []    []   Hand movement non functional []    []   Paralysis []         MOBILITY BASE RECOMMENDATIONS and  JUSTIFICATION:  MOBILITY BASE   JUSTIFICATION   Manufacturer:   Quantum Model:                  [id number]            Color:  Seat Width:   Seat Depth    []  Manual mobility base (continue below)   []  Scooter/POV  [x]  Power mobility base   Number of hours per day spent in above selected mobility base: 8+  Typical daily mobility base use Schedule: during the day at home, to transport from car to her work desk   [x]  is not a safe, functional ambulator  [x]  limitation prevents from completing a MRADL(s) within a reasonable time frame    [x]  limitation places at high risk of morbidity or mortality secondary to  the attempts to perform a    MRADL(s)  [x]  limitation prevents accomplishing a MRADL(s) entirely  [x]  provide independent mobility  [x]  equipment is a lifetime medical need  [x]  walker or cane inadequate  [x]  any type manual wheelchair      inadequate  [x]  scooter/POV inadequate      []  requires dependent mobility          MANUAL MOBILITY      []  Standard manual wheelchair  K0001      Arm:    []  both []  right  []  left      Foot:   []  both []  right   []  left  []  self-propels wheelchair  []  will use on regular basis  []  chair fits throughout home  []  willing and motivated to use  []  propels with assistance     []  dependent use   []  Standard hemi-manual wheelchair  K0002      Arm:    []  both []  right  []  left      Foot:   []  both []  right   []  left  []  lower seat height required to foot propel  []  short stature  []  self-propels wheelchair  []  will use on regular basis  []  chair fits throughout home  []  willing and motivated to use   []  propels with assistance  []  dependent use   []  Lightweight manual wheelchair  K0003      Arm:    []  both []  right  []  left      Foot:   []  both  []  right  []  left                   []  hemi height required  []  medical condition and weight of  wheelchair affect ability to self      propel standard manual wheelchair in the residence  []  can and does self-propel (marginal  propulsion skills)  []  daily use _________hours  []  chair fits throughout home  []  willing and motivated to use  []  lower seat height required to foot propel  []  short stature   []  High strength lightweight manual  wheelchair (Breezy Ultra 4)  K0004     Arm:    []  both []  right  []  left     Foot:   []  both []  right   []  left                                                                  []   hemi height required []  medical condition and weight of wheelchair affect ability to self propel while engaging in frequent MRADL(s) that cannot be performed in a standard or lightweight manual wheelchair  []  daily use _________hours  []  chair fits throughout home  []  willing and motivated to use  []  prevent repetitive use injuries   []  lower seat height required to foot propel  []  short stature    []  Ultra-lightweight manual wheelchair  K0005     Arm:    []  both []  right  []  left     Foot:   []  both []  right  []  left       []  hemi height required  []  heavy duty    Front seat to floor _____ inches      Rear seat to floor _____ inches      Back height _____ inches     Back angle ______ degrees      Front angle _____ degrees  []   full-time manual wheelchair user  []  Requires individualized fitting and optimal adjustments for multiple features that include adjustable axle configuration, fully adjustable center of gravity, wheel camber, seat and back angle, angle of seat slope, which cannot be accommodated by a K0001 through K0004 manual wheelchair  []  prevent repetitive use injuries  []  daily use_________hours   []  user has high activity patterns that frequently require  them  to go out into the community for the purpose of independently accomplishing high level MRADL activities. Examples of these might include a combination of; shopping, work, school, Photographer, childcare, independently loading and unloading from a vehicle etc.  []  lower seat height required to foot propel  []  short stature   []  heavy duty -  weight over 250lbs   []  Current chair is a K0005   manufacture:___________________  model:_________________  serial#____________________  age:_________    []  First time X9994 user (complete trial)  K0004 time and # of strokes to propel 30 feet: ________seconds _________strokes  X9994 time and # of strokes to propel 30 feet: ________seconds _________strokes  What was the result of the trial between the K0004 and K0005 manual wheelchair? ___    What features of the K0005 w/c are needed as compared to the K0004 base? Why?___    []  adjustable seat and back angle changes the angle of seat slope of the frame to attain a gravity assisted position for efficient propulsion and proper weight distribution along the frame     []  the front of the wheelchair will be configured higher than the back of the chair to allow gravity to assist the user with postural stability  []  the center of the wheel will be positioned for stability, safety and efficient propulsion  []  adjustable axle allows for vertical, horizontal, camber and overall width changes  throughout the wheels for adjustment of the client's exact needs and abilities.   []  adjustable axle increases the stability and function of the chair allowing for adjustment of the center of gravity.   []  accommodates the client's anatomical position in the chair maximizing independence in mobility and maneuverability in all environments.   []  create a minimal fixed tilt-in space to assist in positioning.   []  Describe users full-time manual wheelchair activity patterns:___    []  Power assist Comments:  []  prevent repetitive use injuries  []  repetitive strain injury present in    shoulder girdle    []  shoulder pain is (> or =) to 7/10     during manual  propulsion       Current Pain _____/10  []  requires conservation of energy to participate in MRADL(s) runable to propel up ramps or curbs using manual wheelchair  []  been K0005 user  greater than one year  []  user unwilling to use power      wheelchair (reason): []  less expensive option to power   wheelchair   []  rim activated power assist -      decreased strength   []  Heavy duty manual wheelchair       K0006     Arm:    []  both []  right  []  left     Foot:   []  both []  right  []  left     []  hemi height required    []  Dependent base  []  user exceeds 250lbs  []  non-functional ambulator    []  extreme spasticity  []  over active movement   []  broken frame/hx of repeated     repairs  []  able to self-propel in residence       []  lower seat to floor height required  []  unable to self-propel in residence   []  Extra heavy duty manual wheelchair  K0007     Arm:    []  both []  right  []  left     Foot:   []  both []  right  []  left     []  hemi height required  []  Dependent base  []  user exceeds 300lbs  []  non-functional ambulator    []  able to self-propel in residence   []  lower seat to floor height required  []  unable to self-propel in residence     []  Manual wheelchair with tilt [phone]      (Manual "Tilt-n-Space")  []  patient is dependent for transfers  []  patient requires frequent       positioning for pressure relief   []  patient requires frequent      positioning for poor/absent trunk control        []  Stroller Base  []  infant/child   []  unable to propel manual      wheelchair  []  allows for growth  []  non-functional ambulator  []  non-functional UE  []  independent mobility is not a goal at this time    MANUAL FRAME OPTIONS      Push handles  []  extended   []  angle adjustable   []  standard  []  caregiver access  []  caregiver assist    []  allows "hooking" to enable      increased ability to perform ADLs or maintain balance   []  Angle Adjustable Back  []  postural control  []  control of tone/spasticity  []  accommodation of range of motion  []  UE functional control  []  accommodation for seating system    Rear wheel placement  []  std/fixed  [] fully  adjustableramputee   []  camber ________degree  []  removable rear wheel  []  non-removable rear wheel  Wheel size _______  Wheel style_______________________  []  improved UE access to wheels  []  increase propulsion ability  []  improved stability  []  changing angle in space for      improvement of postural stability  []  remove for transport    []  allow for seating system to fit on  base  []  amputee placement  []  1-arm drive access   r R  r L  []  enable propulsion of manual       wheelchair with one arm    []  amputee placement   Wheel rims/ Hand rims  []   Standard    []  Specialized-____ []  provide ability to propel manual   []  increase self-propulsion with hand wheelchair weakness/decreased grasp     []  Spoke protector/guard   []  prevent hands from getting caught in spokes   Tires:  []  pneumatic  []  flat free inserts  []  solid  Style:  []  decrease roll resistance              []  prevent frequent flats  []  increase shock absorbency  []  decrease maintenance   []  decrease pain from road shock    []  decrease spasms from road shock    Wheel Locks:    []  push []  pull []  scissor  []  lock wheels for transfers  []  lock wheels from rolling   Brake/wheel lock extension:  []  R  []  L  []  allow user to operate wheel locks due to decreased reach or strength   Caster housing:  Caster size:                      Style:                                          []  suspension fork  []  maneuverability   []  stability of wheelchair   []  durability  []  maintenance  []  angle adjustment for posture  []  allow for feet to come under        wheelchair base  []  allows change in seat to floor  height   []  increase shock absorbency  []  decrease pain from road shock  []  decrease spasms from road    shock   []  Side guards  []  prevent clothing getting caught in wheel or becoming soiled   [] provide hip and pelvic stability  []  eliminates contact between body and wheels  []  limit hand contact with wheels    []  Anti-tippers      []  prevent wheelchair from tipping    backward  []  assist caregiver with curbs     POWER MOBILITY      []  Scooter/POV    []  can safely operate   []  can safely transfer   []  has adequate trunk stability   []  cannot functionally propel  manual wheelchair    [x]  Power mobility base    [x]  non-ambulatory   [x]  cannot functionally propel manual wheelchair   [x]  cannot functionally and safely      operate scooter/POV  [x]  can safely operate power       wheelchair  [x]  home is accessible  [x]  willing to use power wheelchair     Tilt  [x]  Powered tilt on powered chair  []  Powered tilt on manual chair  []  Manual tilt on manual chair Comments:  [x]  change position for pressure      [x]  elief/cannot weight shift   [x]  change position against      gravitational force on head and      shoulders   [x]  decrease pain  []  blood pressure management   []  control autonomic dysreflexia  []  decrease respiratory distress  []  management of spasticity  []  management of low tone  [x]  facilitate postural control   [x]  rest periods   [x]  control edema  [x]  increase sitting tolerance   []  aid with transfers     Recline   []  Power recline on power chair  []   Manual recline on manual chair  Comments:    []  intermittent catheterization  []  manage spasticity  []  accommodate femur to back angle  []  change position for pressure relief/cannot weight shift rhigh risk of pressure sore development  []  tilt alone does not accomplish     effective pressure relief, maximum pressure relief achieved at -      _______ degrees tilt   _______ degrees recline   []  difficult to transfer to and from bed []  rest periods and sleeping in chair  []  repositioning for transfers  []  bring to full recline for ADL care  []  clothing/diaper changes in chair  []  gravity PEG tube feeding  []  head positioning  []  decrease pain  []  blood pressure management   []  control autonomic dysreflexia  []  decrease  respiratory distress  []  user on ventilator     Elevator on mobility base  []  Power wheelchair  []  Scooter  []  increase Indep in transfers   []  increase Indep in ADLs    []  bathroom function and safety  []  kitchen/cooking function and safety  []  shopping  []  raise height for communication at standing level  []  raise height for eye contact which reduces cervical neck strain and pain  []  drive at raised height for safety and navigating crowds  []  Other:   []  Vertical position system  (anterior tilt)     (Drive locks-out)    []  Stand       (Drive enabled)  []  independent weight bearing  []  decrease joint contractures  []  decrease/manage spasticity  []  decrease/manage spasms  []  pressure distribution away from   scapula, sacrum, coccyx, and ischial tuberosity  []  increase digestion and elimination   []  access to counters and cabinets  []  increase reach  []  increase interaction with others at eye level, reduces neck strain  []  increase performance of       MRADL(s)      Power elevating legrest    [x]  Center mount (Single) 85-170 degrees       []  Standard (Pair) 100-170 degrees  [x]  position legs at 90 degrees, not available with std power ELR  [x]  center mount tucks into chair to decrease turning radius in home, not available with std power ELR  [x]  provide change in position for LE  []  elevate legs during recline    [x]  maintain placement of feet on      footplate  [x]  decrease edema  [x]  improve circulation  [x]  actuator needed to elevate legrest  [x]  actuator needed to articulate legrest preventing knees from flexing  [x]  Increase ground clearance over      curbs  []   STD (pair) independently                     elevate legrest   POWER WHEELCHAIR CONTROLS      Controls/input device  [x]  Expandable  []  Non-expandable  [x]  Proportional  [x]  Right Hand []  Left Hand  []  Non-proportional/switches/head-array  []  Electrical/proximity         []   Mechanical       Manufacturer:___________________   Type:________________________ [x]  provides access for controlling wheelchair  [x]  programming for accurate control  []  progressive disease/changing condition  []  required for alternative drive      controls       []  lacks motor control to operate  proportional drive control  []  unable to understand proportional controls  []  limited movement/strength  []  extraneous movement /  tremors / ataxic / spastic       [x]  Upgraded electronics controller/harness    [x]  Single power (tilt or recline)   [x]  Expandable    []  Non-expandable plus   []  Multi-power (tilt, recline, power legrest, power seat lift, vertical positioning system, stand)  [x]  allows input device to communicate with drive motors  []  harness provides necessary connections between the controller, input device, and seat functions     [x]  needed in order to operate power seat functions through joystick/ input device  []  required for alternative drive controls     []  Enhanced display  []  required to connect all alternative drive controls   []  required for upgraded joystick      (lite-throw, heavy duty, micro)  []  Allows user to see in which mode and drive the wheelchair is set; necessary for alternate controls       []  Upgraded tracking electronics  []  correct tracking when on uneven surfaces makes switch driving more efficient and less fatiguing  []  increase safety when driving  []  increase ability to traverse thresholds    []  Safety / reset / mode switches     Type:    []  Used to change modes and stop the wheelchair when driving     [x]  Mount for joystick / input device/switches  [x]  swing away for access or transfers   [x]  attaches joystick / input device / switches to wheelchair   [x]  provides for consistent access  [x]  midline for optimal placement    []  Attendant controlled joystick plus     mount  []  safety  []  long distance driving  []  operation of seat functions  []  compliance with  transportation regulations    [x]  Battery NF 22 x 2 [x]  required to power (power assist / scooter/ power wc / other):   []  Power inverter (24V to 12V)  []  required for ventilator / respiratory equipment / other:     CHAIR OPTIONS MANUAL & POWER      Armrests   [x]  adjustable height []  removable  []  swing away []  fixed  [x]  flip back  []  reclining  [x]  full length pads []  desk []  tube arms []  gel pads  [x]  provide support with elbow at 90    []  remove/flip back/swing away for  transfers  [x]  provide support and positioning of upper body    [x]  allow to come closer to table top  []  remove for access to tables  []  provide support for w/c tray  [x]  change of height/angles for variable activities   []  Elbow support / Elbow stop  []  keep elbow positioned on arm pad  []  keep arms from falling off arm pad  during tilt and/or recline   Upper Extremity Support  []  Arm trough  []   R  []   L  Style:  []  swivel mount []  fixed mount   []  posterior hand support  []   tray  []  full tray  []  joystick cut out  []   R  []   L  Style:  []  decrease gravitational pull on      shoulders  []  provide support to increase UE  function  []  provide hand support in natural    position  []  position flaccid UE  []  decrease subluxation    []  decrease edema       []  manage spasticity   []  provide midline positioning  []  provide work surface  []  placement for AAC/ Computer/ EADL  Hangers/ Legrests   []  ______ degree  []  Elevating []  articulating  []  swing away []  fixed []  lift off  []  heavy duty  []  adjustable knee angle  []  adjustable calf panel   []  longer extension tube              []  provide LE support  []  maintain placement of feet on      footplate   []  accommodate lower leg length  []  accommodate to hamstring       tightness  []  enable transfers  []  provide change in position for LE's  []  elevate legs during recline    []  decrease edema  []  durability      Foot support   [x]  footplate  []  R []  L [x]  flip up           []  Depth adjustable   []  angle adjustable  []  foot board/one piece    [x]  provide foot support  []  accommodate to ankle ROM  [x]  allow foot to go under wheelchair base  []  enable transfers     []  Shoe holders  []  position foot    []  decrease / manage spasticity  []  control position of LE  []  stability    []  safety     []  Ankle strap/heel      loops  []  support foot on foot support  []  decrease extraneous movement  []  provide input to heel   []  protect foot     []  Amputee adapter []  R  []  L     Style:                  Size:  []  Provide support for stump/residual extremity    []  Transportation tie-down  []  to provide crash tested tie-down brackets    []  Crutch/cane holder    []  O2 holder    []  IV hanger   []  Ventilator tray/mount    []  stabilize accessory on wheelchair       Component  Justification     [x]  Seat cushion      []  accommodate impaired sensation  []  decubitus ulcers present or history  []  unable to shift weight  [x]  increase pressure distribution  []  prevent pelvic extension  []  custom required "off-the-shelf"    seat cushion will not accommodate deformity  [x]  stabilize/promote pelvis alignment  [x]  stabilize/promote femur alignment  []  accommodate obliquity  []  accommodate multiple deformity  []  incontinent/accidents  [x]  low maintenance     []  seat mounts                 []  fixed []  removable  []  attach seat platform/cushion to wheelchair frame    []  Seat wedge    []  provide increased aggressiveness of seat shape to decrease sliding  down in the seat  []  accommodate ROM        []  Cover replacement   []  protect back or seat cushion  []  incontinent/accidents    []  Solid seat / insert    []  support cushion to prevent      hammocking  []  allows attachment of cushion to mobility base    []  Lateral pelvic/thigh/hip     support (Guides)     []  decrease abduction  []  accommodate pelvis  []  position upper legs  []  accommodate  spasticity  []  removable for transfers     []  Lateral pelvic/thigh      supports mounts  []  fixed   []   swing-away   []  removable  []  mounts lateral pelvic/thigh supports     []  mounts lateral pelvic/thigh supports swing-away or removable for transfers    []  Medial thigh support (Pommel)  [] decrease adduction  [] accommodate ROM  []  remove for transfers   []  alignment      []  Medial thigh   []  fixed      support mounts      []  swing-away   []  removable  []  mounts medial thigh supports   []  Mounts medial supports swing- away or removable for transfers       Component  Justification   [x]  Back       [x]  provide posterior trunk support []  facilitate tone  [x]  provide lumbar/sacral support []  accommodate deformity  [x]  support trunk in midline   []  custom required "off-the-shelf" back support will not accommodate deformity   []  provide lateral trunk support []  accommodate or decrease tone            []  Back mounts  []  fixed  []  removable  []  attach back rest/cushion to wheelchair frame   []  Lateral trunk      supports  []  R []  L  []  decrease lateral trunk leaning  []  accommodate asymmetry    []  contour for increased contact  []  safety    []  control of tone    []  Lateral trunk      supports mounts  []  fixed  []  swing-away   []  removable  []  mounts lateral trunk supports     []  Mounts lateral trunk supports swing-away or removable for transfers   []  Anterior chest      strap, vest     []  decrease forward movement of shoulder  []  decrease forward movement of trunk  []  safety/stability  []  added abdominal support  []  trunk alignment  []  assistance with shoulder control   []  decrease shoulder elevation    [x]  Headrest      [x]  provide posterior head support  []  provide posterior neck support  []  provide lateral head support  []  provide anterior head support  []  support during tilt and recline  []  improve feeding     []  improve respiration  []  placement of switches  []  safety     []  accommodate ROM   []  accommodate tone  []  improve visual orientation   [x]  Headrest           []  fixed [x]  removable []  flip down      Mounting hardware   []  swing-away laterals/switches  [x]  mount headrest   [x]  mounts headrest flip down or  removable for transfers  []  mount headrest swing-away laterals   []  mount switches     []  Neck Support    []  decrease neck rotation  []  decrease forward neck flexion   Pelvic Positioner    [x]  std hip belt          []  padded hip belt  []  dual pull hip belt  []  four point hip belt  []  stabilize tone  [x]  decrease falling out of chair  []  prevent excessive extension  []  special pull angle to control      rotation  []  pad for protection over boney   prominence  []  promote comfort    []  Essential needs        bag/pouch   []  medicines []  special food rorthotics []  clothing changes  []  diapers  []  catheter/hygiene []  ostomy supplies  The above equipment has a life- long use expectancy.  Growth and changes in medical and/or functional conditions would be the exceptions.   SUMMARY:    ASSESSMENT:  CLINICAL IMPRESSION: Patient is a 58 y.o. female who was seen today for physical therapy evaluation and treatment for power mobility assessment. Patient has significant PMH that includes POTS, . A-fib, cellultis, obesity (BMI >51), polymyalgia Rheumatica, bil total hip replacement, DJD of bil knees, hx of lumbar surgeries, hx of L rotator cuff repair, O2 dependent at night, and chronic multi joint pain. Objective impairments include decreased L shoulder ROM and strength, decreased bil LE strength, increased edema in bil LE, decreased functional mobility and increased fall risk (based on TUG score). Patient is currently using cane/walker for short distance mobility within her home. Patient is unable to propel manual wheelchair considering current BMI level, hx of L shoulder surgery and bil shoulder pain, and amount of effort it will require to propel manual  wheelchair. Patient is at higher risk for fall using cane and walker and her walking endurance is limited due to chronic back pain, chronic bil hip pain and bil shoulder pain.  Considering her current objective impairments, past medical history, and current home/work requirements, Group 2 power wheelchair with power tilt and power elevating leg rest will be the least cost effective option.   Power til on the chair will allow patient to tilt the chair allow patient to shift weight by shifting gravitational forces to improve chronic back pain management by reducing strain and discomfort and allowing for rest breaks. Power tilt will also allow for thoracic extension and improve diaphragm function to aide with respiration.   Power tilt with combined power elevating leg rest will allow patient to elevate legs to assist with edema management.  Above recommendations in Group 2 power wheelchair will be essential at improving patient's function, independence and reduce fall risk.     OBJECTIVE IMPAIRMENTS Abnormal gait, cardiopulmonary status limiting activity, decreased activity tolerance, decreased balance, decreased endurance, decreased mobility, difficulty walking, decreased ROM, decreased strength, hypomobility, increased edema, increased fascial restrictions, increased muscle spasms, impaired flexibility, impaired UE functional use, postural dysfunction, obesity, and pain.   ACTIVITY LIMITATIONS carrying, lifting, bending, standing, squatting, stairs, transfers, bathing, dressing, reach over head, and hygiene/grooming  PARTICIPATION LIMITATIONS: meal prep, cleaning, laundry, shopping, community activity, occupation, and yard work  PERSONAL FACTORS Time since onset of injury/illness/exacerbation and 3+ comorbidities: POTS, Obesity, bil THA, multilevel DJD are also affecting patient's functional outcome.   REHAB POTENTIAL: Good  CLINICAL DECISION MAKING: Stable/uncomplicated  EVALUATION  COMPLEXITY: Moderate                                   GOALS: One time visit. No goals established.    PLAN: PT FREQUENCY: one time visit    Raj LOISE Blanch, PT 05/24/2024, 2:43 PM    I concur with the above findings and recommendations of the therapist:  Physician name printed:         Physician's signature:      Date:

## 2024-05-26 DIAGNOSIS — G4733 Obstructive sleep apnea (adult) (pediatric): Secondary | ICD-10-CM | POA: Diagnosis not present

## 2024-05-26 DIAGNOSIS — J189 Pneumonia, unspecified organism: Secondary | ICD-10-CM | POA: Diagnosis not present

## 2024-05-29 DIAGNOSIS — D801 Nonfamilial hypogammaglobulinemia: Secondary | ICD-10-CM | POA: Diagnosis not present

## 2024-06-06 DIAGNOSIS — G4733 Obstructive sleep apnea (adult) (pediatric): Secondary | ICD-10-CM | POA: Diagnosis not present

## 2024-06-07 ENCOUNTER — Other Ambulatory Visit: Payer: Self-pay

## 2024-06-07 ENCOUNTER — Other Ambulatory Visit: Payer: Self-pay | Admitting: Internal Medicine

## 2024-06-07 ENCOUNTER — Other Ambulatory Visit (HOSPITAL_COMMUNITY): Payer: Self-pay

## 2024-06-08 ENCOUNTER — Other Ambulatory Visit (HOSPITAL_COMMUNITY): Payer: Self-pay

## 2024-06-09 ENCOUNTER — Other Ambulatory Visit: Payer: Self-pay

## 2024-06-09 NOTE — Progress Notes (Signed)
 Specialty Pharmacy Refill Coordination Note  Ellenor Wisniewski is a 58 y.o. female contacted today regarding refills of specialty medication(s) Sarilumab  (Kev[facility]ara )   Patient requested Delivery   Delivery date: 06/23/24   Verified address: [address]  [place] [address]   Medication will be filled on 06/22/24.

## 2024-06-12 DIAGNOSIS — D801 Nonfamilial hypogammaglobulinemia: Secondary | ICD-10-CM | POA: Diagnosis not present

## 2024-06-21 ENCOUNTER — Encounter: Payer: Self-pay | Admitting: Registered Nurse

## 2024-06-21 ENCOUNTER — Encounter: Attending: Physical Medicine and Rehabilitation | Admitting: Registered Nurse

## 2024-06-21 VITALS — BP 114/69 | HR 62 | Ht 65.0 in | Wt 302.2 lb

## 2024-06-21 DIAGNOSIS — M25511 Pain in right shoulder: Secondary | ICD-10-CM | POA: Insufficient documentation

## 2024-06-21 DIAGNOSIS — M25512 Pain in left shoulder: Secondary | ICD-10-CM | POA: Diagnosis not present

## 2024-06-21 DIAGNOSIS — Z79899 Other long term (current) drug therapy: Secondary | ICD-10-CM | POA: Diagnosis not present

## 2024-06-21 DIAGNOSIS — G894 Chronic pain syndrome: Secondary | ICD-10-CM | POA: Diagnosis not present

## 2024-06-21 DIAGNOSIS — M545 Low back pain, unspecified: Secondary | ICD-10-CM | POA: Insufficient documentation

## 2024-06-21 DIAGNOSIS — M25562 Pain in left knee: Secondary | ICD-10-CM | POA: Diagnosis not present

## 2024-06-21 DIAGNOSIS — G8929 Other chronic pain: Secondary | ICD-10-CM | POA: Insufficient documentation

## 2024-06-21 DIAGNOSIS — M25561 Pain in right knee: Secondary | ICD-10-CM | POA: Diagnosis not present

## 2024-06-21 DIAGNOSIS — Z5181 Encounter for therapeutic drug level monitoring: Secondary | ICD-10-CM | POA: Diagnosis not present

## 2024-06-21 MED ORDER — OXYCODONE-ACETAMINOPHEN 5-325 MG PO TABS: 1.0000 | ORAL_TABLET | Freq: Three times a day (TID) | ORAL | 0 refills | Status: DC | PRN

## 2024-06-21 NOTE — Progress Notes (Signed)
 Subjective:    Patient ID: Monique Clark, female    DOB: 29-May-1966, 58 y.o.   MRN: [id number]  HPI: [name] is a 58 y.o. female who returns for follow up appointment for chronic pain and medication refill. She states her  pain is located in her bilateral shoulders, lower back and bilateral knee pain. She  reports 4 hours of relief with her current medication regimen, her tablets was increased. She will send a update in two - three weeks, she verbalizes understanding. rates her  pain 5. Her current exercise regime is walking with her walker and performing stretching exercises.  [name] Morphine  equivalent is 15.00 MME.   Last UDS was Performed on 03/28/2024, it was consistent.    Pain Inventory Average Pain 4 Pain Right Now 5 My pain is constant, dull, and aching  In the last 24 hours, has pain interfered with the following? General activity 9 Relation with others 4 Enjoyment of life 8 What TIME of day is your pain at its worst? morning  and night Sleep (in general) Fair  Pain is worse with: walking, bending, and standing Pain improves with: rest and medication Relief from Meds: 5  Family History  Problem Relation Age of Onset   Atrial fibrillation Mother    Sleep apnea Mother    Colon cancer Father    Cancer Father        Colon   Colon cancer Sister    Basal cell carcinoma Brother    Atrial fibrillation Maternal Uncle    Atrial fibrillation Maternal Grandmother    Atrial fibrillation Maternal Grandfather    Heart attack Maternal Grandfather    Ovarian cancer Neg Hx    Stomach cancer Neg Hx    Rectal cancer Neg Hx    Social History   Socioeconomic History   Marital status: Married    Spouse name: Not on file   Number of children: 3   Years of education: Not on file   Highest education level: Doctorate  Occupational History   Occupation: rn  Tobacco Use   Smoking status: Former    Current packs/day: 0.00    Average packs/day: 0.5  packs/day for 20.0 years (10.0 ttl pk-yrs)    Types: Cigarettes    Start date: 09/29/1982    Quit date: 09/29/2002    Years since quitting: 21.7    Passive exposure: Current   Smokeless tobacco: Never  Vaping Use   Vaping status: Never Used  Substance and Sexual Activity   Alcohol use: Yes    Comment: once a month- rarely   Drug use: No   Sexual activity: Yes    Birth control/protection: Surgical, Post-menopausal    Comment: 1st intercourse 27 yo-5 partners  Other Topics Concern   Not on file  Social History Narrative   Pt lives alone    Pt works    Social Drivers of Corporate investment banker Strain: Low Risk  (03/13/2023)   Overall Financial Resource Strain (CARDIA)    Difficulty of Paying Living Expenses: Not very hard  Food Insecurity: No Food Insecurity (03/13/2023)   Hunger Vital Sign    Worried About Running Out of Food in the Last Year: Never true    Ran Out of Food in the Last Year: Never true  Transportation Needs: No Transportation Needs (03/13/2023)   PRAPARE - Administrator, Civil Service (Medical): No    Lack of Transportation (Non-Medical): No  Physical Activity: Unknown (03/13/2023)  Exercise Vital Sign    Days of Exercise per Week: 0 days    Minutes of Exercise per Session: Not on file  Stress: Stress Concern Present (03/13/2023)   [employer] of Occupational Health - Occupational Stress Questionnaire    Feeling of Stress : To some extent  Social Connections: Moderately Isolated (03/13/2023)   Social Connection and Isolation Panel    Frequency of Communication with Friends and Family: Twice a week    Frequency of Social Gatherings with Friends and Family: Once a week    Attends Religious Services: Never    Database administrator or Organizations: No    Attends Engineer, structural: Not on file    Marital Status: Married   Past Surgical History:  Procedure Laterality Date   ABDOMINAL HYSTERECTOMY     APPENDECTOMY     CESAREAN  SECTION     CHOLECYSTECTOMY     COLONOSCOPY     HEMATOMA EVACUATION N/A 03/04/2023   Procedure: EVACUATION HEMATOMA SEROMA WITH RECLOSURE LUMBAR INCISION;  Surgeon: Barbarann Oneil BROCKS, MD;  Location: [facility] OR;  Service: Orthopedics;  Laterality: N/A;   JOINT REPLACEMENT Left    hip   JOINT REPLACEMENT Right    Right Hip 2022   LAPAROSCOPIC GASTRIC SLEEVE RESECTION  04/29/2022   left shoulder repair     rotator cuff repair   LUMBAR LAMINECTOMY N/A 09/18/2021   Procedure: RIGHT LUMBAR FOUR-FIVE MICRODISCECTOMY;  Surgeon: Barbarann Oneil BROCKS, MD;  Location: [facility];  Service: Orthopedics;  Laterality: N/A;   LUMBAR LAMINECTOMY/DECOMPRESSION MICRODISCECTOMY N/A 02/27/2023   Procedure: L4-5 LUMBAR DECOMPRESSION, MICRODISCECTOMY;  Surgeon: Barbarann Oneil BROCKS, MD;  Location: [facility];  Service: Orthopedics;  Laterality: N/A;   LUNG REMOVAL, PARTIAL Right 2009   VATS, wedge resection partial lobectomy, done at [facility]   myringostomy Bilateral    Myringotomy   SPLENECTOMY, TOTAL  1998   TONSILLECTOMY AND ADENOIDECTOMY     UPPER GI ENDOSCOPY N/A 04/29/2022   Procedure: UPPER GI ENDOSCOPY;  Surgeon: Stevie, Herlene Righter, MD;  Location: [facility];  Service: General;  Laterality: N/A;   Past Surgical History:  Procedure Laterality Date   ABDOMINAL HYSTERECTOMY     APPENDECTOMY     CESAREAN SECTION     CHOLECYSTECTOMY     COLONOSCOPY     HEMATOMA EVACUATION N/A 03/04/2023   Procedure: EVACUATION HEMATOMA SEROMA WITH RECLOSURE LUMBAR INCISION;  Surgeon: Barbarann Oneil BROCKS, MD;  Location: [facility];  Service: Orthopedics;  Laterality: N/A;   JOINT REPLACEMENT Left    hip   JOINT REPLACEMENT Right    Right Hip 2022   LAPAROSCOPIC GASTRIC SLEEVE RESECTION  04/29/2022   left shoulder repair     rotator cuff repair   LUMBAR LAMINECTOMY N/A 09/18/2021   Procedure: RIGHT LUMBAR FOUR-FIVE MICRODISCECTOMY;  Surgeon: Barbarann Oneil BROCKS, MD;  Location: [facility];  Service: Orthopedics;  Laterality: N/A;   LUMBAR LAMINECTOMY/DECOMPRESSION  MICRODISCECTOMY N/A 02/27/2023   Procedure: L4-5 LUMBAR DECOMPRESSION, MICRODISCECTOMY;  Surgeon: Barbarann Oneil BROCKS, MD;  Location: [facility];  Service: Orthopedics;  Laterality: N/A;   LUNG REMOVAL, PARTIAL Right 2009   VATS, wedge resection partial lobectomy, done at [facility]   myringostomy Bilateral    Myringotomy   SPLENECTOMY, TOTAL  1998   TONSILLECTOMY AND ADENOIDECTOMY     UPPER GI ENDOSCOPY N/A 04/29/2022   Procedure: UPPER GI ENDOSCOPY;  Surgeon: Stevie, Herlene Righter, MD;  Location: [facility];  Service: General;  Laterality: N/A;   Past  Medical History:  Diagnosis Date   Abscess    Arthritis    Asthma    November 2023- hospitalized   Atrial fibrillation [facility])    Cardiac arrhythmia due to congenital heart disease    trace MR 08/15/20 echo ([place])- pt denies congenital heart disease   Cellulitis 03/2024   Cervical lymphadenopathy    CVID (common variable immunodeficiency) (HCC) 2015   takes Hizentra    Diverticulosis    Elevated cholesterol    GERD (gastroesophageal reflux disease)    Headache    High blood pressure    Non Hodgkin's lymphoma (HCC) 1998   Obesity    Personal history of gestational diabetes    PMR (polymyalgia rheumatica)    takes Kevzara    Pneumonia    PONV (postoperative nausea and vomiting)    POTS (postural orthostatic tachycardia syndrome)    Pre-diabetes    Sleep apnea    CPAP with 2L O2   UTI (urinary tract infection)    UTI (urinary tract infection) 03/2024   BP 114/69 (BP Location: Right Arm, Patient Position: Sitting, Cuff Size: Large) Comment (BP Location): per patient request  Pulse 62   Ht 5' 5 (1.651 m)   Wt (!) 302 lb 3.2 oz (137.1 kg)   SpO2 98%   BMI 50.29 kg/m   Opioid Risk Score:   Fall Risk Score:  `1  Depression screen [facility] 2/9     06/21/2024   10:32 AM 04/22/2024    9:20 AM 03/28/2024    9:32 AM 03/16/2023    1:10 PM 09/18/2022   10:45 AM 08/28/2022   10:42 AM 08/28/2022   10:41 AM  Depression screen  PHQ 2/9  Decreased Interest 1 1 0 0 0 0 0  Down, Depressed, Hopeless 0 1 1 0 0 0 0  PHQ - 2 Score 1 2 1  0 0 0 0  Altered sleeping 2  0   0   Tired, decreased energy 2  3   0   Change in appetite 0  1   0   Feeling bad or failure about yourself  0  0   0   Trouble concentrating 0  0   0   Moving slowly or fidgety/restless 0  0   0   Suicidal thoughts 0  0   0   PHQ-9 Score 5  5   0   Difficult doing work/chores Very difficult  Very difficult   Not difficult at all       Review of Systems  Musculoskeletal:  Positive for arthralgias, back pain, joint swelling and neck pain.       Bilateral shoulder pain, left elbow pain, low back pain, bright knee pain, bilateral foot pain Left lower leg swelling and pain       Objective:   Physical Exam Vitals and nursing note reviewed.  Constitutional:      Appearance: Normal appearance.  Cardiovascular:     Rate and Rhythm: Normal rate and regular rhythm.     Pulses: Normal pulses.     Heart sounds: Normal heart sounds.  Pulmonary:     Effort: Pulmonary effort is normal.     Breath sounds: Normal breath sounds.  Musculoskeletal:     Comments: Normal Muscle Bulk and Muscle Testing Reveals:  Upper Extremities:Decreased ROM 90 Degrees and Muscle Strength 5/5 Bilateral AC Joint Tenderness Thoracic  Hypersensitivity Lumbar Paraspinal Tenderness: L-3-L-5 Lower Extremities: Full ROM and Muscle Strength 5/5 Bilateral Lower Extremities Flexion Produces Pain  into her Lumbar Arises from Table slowly using walker for support Antalgic  Gait     Skin:    General: Skin is warm and dry.  Neurological:     Mental Status: She is alert and oriented to person, place, and time.  Psychiatric:        Mood and Affect: Mood normal.        Behavior: Behavior normal.           Assessment & Plan:  Chronic Bilateral  Shoulder Pain: Continue HEP as Tolerated. Continue to Monitor. 06/21/2024 Lower Back Pain: Continue HEP as tolerated. Continue to  Monitor. 06/21/2024 Chronic Bilateral Knee Pain: Continue HEP as Tolerated. Continue to Monitor.  Bilateral Foot Pain: No complaints today. Continue HEP as Tolerated. Continue current medication regimen. Continue to monitor. 06/21/2024 Fall at Home: No falls this month. Educated on falls prevention, she verbalizes understanding. Continue to monitor.  Chronic Pain syndrome: Continue Gabapentin . Will speak with Dr Emeline regarding [place], she verbalizes understanding. Refilled: Increased: Oxycodone  5mg /325 one tablet three timesa day as needed for pain # 90. We will continue the opioid monitoring program, this consists of regular clinic visits, examinations, urine drug screen, pill counts as well as use of [place]  Controlled Substance Reporting system. A 12 month History has been reviewed on the [place]  Controlled Substance Reporting System on 06/21/2024 F/U in 2 months

## 2024-06-21 NOTE — Patient Instructions (Addendum)
 Send a My chart message in two weeks with update on medication change.

## 2024-06-22 ENCOUNTER — Other Ambulatory Visit: Payer: Self-pay

## 2024-06-26 DIAGNOSIS — G4733 Obstructive sleep apnea (adult) (pediatric): Secondary | ICD-10-CM | POA: Diagnosis not present

## 2024-06-26 DIAGNOSIS — D801 Nonfamilial hypogammaglobulinemia: Secondary | ICD-10-CM | POA: Diagnosis not present

## 2024-06-26 DIAGNOSIS — J189 Pneumonia, unspecified organism: Secondary | ICD-10-CM | POA: Diagnosis not present

## 2024-06-27 ENCOUNTER — Other Ambulatory Visit: Payer: Self-pay

## 2024-06-29 ENCOUNTER — Telehealth: Payer: Self-pay | Admitting: Registered Nurse

## 2024-06-29 NOTE — Telephone Encounter (Signed)
 Spoke with Dr Emeline regarding Monique Clark.  [name] will have to be weaned off Oxycodone  to initiate Butrans. Or change  to Tramadol  and Butrans. Call placed to [facility], no answer. Left message to return the call.

## 2024-06-30 DIAGNOSIS — M79672 Pain in left foot: Secondary | ICD-10-CM | POA: Diagnosis not present

## 2024-07-02 ENCOUNTER — Encounter: Payer: Self-pay | Admitting: Internal Medicine

## 2024-07-04 NOTE — Telephone Encounter (Signed)
 Please advise as Md is out of office

## 2024-07-06 ENCOUNTER — Other Ambulatory Visit: Payer: Self-pay

## 2024-07-06 ENCOUNTER — Other Ambulatory Visit: Payer: Self-pay | Admitting: Internal Medicine

## 2024-07-06 DIAGNOSIS — Z79899 Other long term (current) drug therapy: Secondary | ICD-10-CM

## 2024-07-06 DIAGNOSIS — M353 Polymyalgia rheumatica: Secondary | ICD-10-CM

## 2024-07-06 MED ORDER — KEVZARA 200 MG/1.14ML ~~LOC~~ SOAJ
200.0000 mg | SUBCUTANEOUS | 2 refills | Status: DC
  Filled 2024-07-06: qty 2.28, 28d supply, fill #0
  Filled 2024-08-16: qty 2.28, 28d supply, fill #1

## 2024-07-06 NOTE — Progress Notes (Signed)
 Specialty Pharmacy Ongoing Clinical Assessment Note  Monique Clark is a 58 y.o. female who is being followed by the specialty pharmacy service for RxSp Rheumatoid Arthritis   Patient's specialty medication(s) reviewed today: Sarilumab  (Kevzara )   Missed doses in the last 4 weeks: 0   Patient/Caregiver did not have any additional questions or concerns.   Therapeutic benefit summary: Patient is achieving benefit   Adverse events/side effects summary: No adverse events/side effects   Patient's therapy is appropriate to: Continue    Goals Addressed             This Visit's Progress    Minimize recurrence of flares       Patient is not on track and no change. Patient will maintain adherence and be monitored by provider to determine if a change in treatment plan is warranted. Patient states that she is still having symptoms and provider is aware. She has appointment coming on 11/11 to discuss but will continue treatment until then.          Follow up: 12 months  Monique Clark Specialty Pharmacist

## 2024-07-06 NOTE — Progress Notes (Signed)
 Specialty Pharmacy Refill Coordination Note  Monique Clark is a 58 y.o. female contacted today regarding refills of specialty medication(s) Sarilumab  (Kevzara )   Patient requested Delivery   Delivery date: 07/27/24   Verified address: [address]  [place] [address]   Medication will be filled on 07/26/24.

## 2024-07-06 NOTE — Telephone Encounter (Signed)
 Last Fill: 03/21/2024  Labs: 05/09/2024 Absolute Monocytes 1, 210  TB Gold: 09/14/2023 Neg    Next Visit: 08/09/2024   Last Visit: 05/09/2024  DX: PMR (polymyalgia rheumatica)    Current Dose per office note 05/09/2024: Kevzara  200 mg subcu q. 14 days   Okay to refill Kevzara ?

## 2024-07-11 ENCOUNTER — Telehealth: Payer: Self-pay | Admitting: Physical Medicine and Rehabilitation

## 2024-07-11 NOTE — Telephone Encounter (Signed)
 I have not received anything, and if I did I would have been very confused; last I saw her she was ambulatory, and her last PT eval in August indicated no need for wheelchair.  I'm not seeing anything in [name]'s visits to indicate a wheelchair eval, either.

## 2024-07-11 NOTE — Telephone Encounter (Signed)
 Camie from mobility stated she has left 3 vms with us  to check the status of pts mobility wheelchair paperwork. I don't see this up front anywhere. I have tagged both of you all to see if maybe one of you have this or know anything about this?

## 2024-07-17 DIAGNOSIS — D801 Nonfamilial hypogammaglobulinemia: Secondary | ICD-10-CM | POA: Diagnosis not present

## 2024-07-20 ENCOUNTER — Other Ambulatory Visit: Payer: Self-pay

## 2024-07-20 ENCOUNTER — Telehealth: Payer: Self-pay | Admitting: Registered Nurse

## 2024-07-20 DIAGNOSIS — G8929 Other chronic pain: Secondary | ICD-10-CM

## 2024-07-20 DIAGNOSIS — G894 Chronic pain syndrome: Secondary | ICD-10-CM

## 2024-07-20 DIAGNOSIS — M545 Low back pain, unspecified: Secondary | ICD-10-CM

## 2024-07-20 MED ORDER — OXYCODONE-ACETAMINOPHEN 5-325 MG PO TABS: 1.0000 | ORAL_TABLET | Freq: Three times a day (TID) | ORAL | 0 refills | Status: DC | PRN

## 2024-07-20 NOTE — Telephone Encounter (Signed)
 PMP was Reviewed.  Oxycodone  e-scribed to pharmacy.  Monique Clark is aware of the above.

## 2024-07-20 NOTE — Telephone Encounter (Signed)
 Monique Clark stated she will be out Oxycodone  5-325 mg today. Please send Rx to [facility]. Call back phone [phone]

## 2024-07-25 DIAGNOSIS — D801 Nonfamilial hypogammaglobulinemia: Secondary | ICD-10-CM | POA: Diagnosis not present

## 2024-07-26 DIAGNOSIS — G4733 Obstructive sleep apnea (adult) (pediatric): Secondary | ICD-10-CM | POA: Diagnosis not present

## 2024-07-26 DIAGNOSIS — J189 Pneumonia, unspecified organism: Secondary | ICD-10-CM | POA: Diagnosis not present

## 2024-07-27 NOTE — Progress Notes (Signed)
 Office Visit Note  Patient: Monique Clark             Date of Birth: 1965-11-04           MRN: [id number]             PCP: Rollene Almarie LABOR, MD Referring: Rollene Almarie LABOR, * Visit Date: 08/09/2024   Subjective:   Discussed the use of AI scribe software for clinical note transcription with the patient, who gave verbal consent to proceed.  History of Present Illness   Monique Clark is a 58 y.o. female here for follow up  for PMR on Kevzara  200mg  subcu every 14 days.     She has been consistently using Kevzara  injections every two weeks, which have been effective in managing her symptoms. However, she continues to experience significant issues with her foot, which has been acting up. This is generally attributed to her OA and remote possible avulsion fracture. Her foot discomfort is exacerbated by certain footwear, partially better wearing Hokas.  She experiences tenderness and swelling in her shoulder and elbow, with the elbow being particularly tender and swollen. Persistent nerve impingement symptoms in her elbow affect her fingers, and these symptoms have not changed since the last visit.  She had a gastrointestinal issue last week but otherwise reports feeling well. She has not experienced any major flare-ups or changes in her condition since the last visit. She notes some changes with the cooler weather, but nothing significant.   Previous HPI 05/09/2024 Monique Clark is a 58 y.o. female here for follow up for PMR on Kevzara  200mg  subcu every 14 days.     She experiences significant swelling in her arm, which has always been slightly larger, and numbness in two fingers, resembling cubital tunnel syndrome. The numbness primarily affects two fingers, and no recent activities have exacerbated the condition. She has a history of tendinitis in the area. She is not using braces or doing specific exercises for her elbow, and no recent imaging has been performed. She has  difficulty with grasping, particularly with her right hand.   She recently had an episode of cellulitis following a bug bite and a bad yeast infection under her breasts and back, which she has experienced before during humid weather. The yeast infection was treated with Diflucan  and nystatin , and she is now using a moisture barrier instead of talc powder.   She mentions ongoing back pain, for which she uses lidocaine  patches, and notes that her shoulders feel better with Kevzara . She has not been engaged in physical therapy recently. Her leg swelling remains the same, with a slight increase on the left side, but not severe.   No recent illness aside from the cellulitis and yeast infection. Back pain persists daily but feels partially improved with Kevzara . Hand numbness is bothersome, particularly in the right hand.         Previous HPI 02/03/2024 Monique Clark is a 58 y.o. female here for follow up for PMR on KEVZARA  200mg  subcut every 14 days.     Cervical lymphadenopathy has been present since January, accompanied by hoarseness and left-sided pain. An ultrasound and CT scan revealed the largest lymph node measuring 1.2 cm. She has a history of non-Hodgkin lymphoma diagnosed 25 years ago, for which she underwent a splenectomy and RTX treatment.   She has hypogammaglobulinemia, which led to several infections last year, including a UTI, upper respiratory infections, flu, and COVID-19. This was while off her HIzentra  injections. Since resuming,  her immunoglobulin levels have since stabilized, and she has not experienced further infections this year. She administers Hizentra  weekly on Sundays.   She is on Kevzara , which she paused for a month in January-February due to concerns about its effects on her lymphadenopathy. During this period, she experienced increased swelling on the left side, joint pain, and body pain. She resumed Kevzara  two weeks ago and takes it weekly on Wednesdays.   She  experienced oral thrush, treated with a five-day course of Diflucan , and consulted an ENT who noted possible edema. She also reports nystagmus and increased morning stiffness. No recent yeast rashes, but she has had them in the past.   She mentions a recent ablation fracture in her foot, leading to stiffness and soreness in her ankle. She was in a boot for a month. She uses a scooter at work to aid mobility.       Previous HPI 10/13/2023 Monique Clark is a 58 y.o. female here for follow up for PMR on KEVZARA  200mg  subcut every 14 days.  They report a gastrointestinal upset followed by an upper respiratory infection, and most recently, symptoms suggestive of a urinary tract infection (UTI) that began last Saturday. The UTI symptoms, including a burning sensation during urination, have slightly improved with home remedies and over-the-counter Azo.   In the context of these illnesses, the patient chose to skip their last dose of Kevzara , which was due two weeks ago. They also report a sore throat and neck pain, with a sensation of swelling on one side. Despite these issues, they note that their throat hoarseness is improving.   Prior to these recent illnesses, the patient had been experiencing joint pain, which had not significantly worsened. They had a short course of steroids during their recent illness, which seemed to alleviate some of their symptoms. However, they now report new back pain, which they suspect may be related to the potential UTI. They also note occasional numbness in the lower back region.   The patient also mentions increased difficulty with long-distance walking, necessitating the use of a scooter for such instances. They report some swelling in their left foot, but deny any significant edema. They also report a recent scratch on their elbow, which has been causing some discomfort.         Previous HPI 06/24/2023 Monique Clark is a 58 y.o. female here for follow up for  PMR on KEVZARA  200mg  subcut every 14 days.  She feels symptoms are overall doing better on the medicine consistently but has some swelling and stiffness worse in the left arm. Particularly shoulder and elbow hurts worse with use but also achy at night. Low back pain is worse, had updated MRI on 8/18 with progression of degenerative changes. Following up with orthopedics for this, recommending possible fusion surgery but BMI is not appropriate.   Previous HPI 03/24/2023 Monique Clark is a 58 y.o. female here for follow up for PMR on KEVZARA  200mg  subcut every 14 days.  She started the Kevzara  in March and reports improvement in joint pain and stiffness.  She was able to completely discontinue prednisone  without severe exacerbation of joint pain and weakness.  Her treatment was interrupted for several weeks due to going for her lumbar laminectomy at L4-L5 admitted for this May 31.  This was mildly complicated by requiring evacuation of associated hematoma seroma before discharge home.  Since then is proceeding well still has soreness in the area stitches still in place.  She since resumed  Kevzara  so far only 1 dose but notes seeing improvement in joint stiffness starting again within days.   Previous HPI 11/17/2022 Monique Clark is a 58 y.o. female here for follow up for PMR on prednisone  10 mg daily.  May recheck labs previously in January getting back onto this medication dose still showing very highly elevated serum inflammatory markers.  Symptoms are not too bad while on medication but she has not tolerated tapering.  In the number of infectious issues related to URI but currently this has cleared up as well.     Previous HPI 09/18/22 Monique Clark is a 58 y.o. female here for follow up for PMR on prednisone  10 mg daily. Since the past visit she had a hospital admission for hypoxia suspected as viral URI trigger of asthma exacerbation. She stopped methotrexate  at that time and has been holding  off resuming the medication concern about contributing to infection.  Currently is partly through a course of Augmentin for the persistent upper respiratory infection.  She had lumbar spine injection for ongoing back pain without a great relief of symptoms this time.   Previous HPI 05/20/22 Monique Clark is a 58 y.o. female here for follow up for PMR on methotrexate  15 mg p.o. weekly.  Since her last visit she underwent gastric sleeve resection surgery for weight reduction.  This has still been in the recovery process.  She discontinued the prednisone  perioperatively and has been off this.  She notices multiple symptoms are worse with joint pain and swelling especially in shoulders and elbows.  Not as much lower extremity swelling.   Previous HPI 01/06/2022 Monique Clark is a 58 y.o. female here for follow up for PMR on prednisone  10 mg daily and started methotrexate  10 mg PO weekly last month due to persistent inflammatory lab elevations.  She continues having fatigue is persistent.  Shortness of breath with walking and exertion is slightly improved.  She has not noticed any side effects or intolerance to methotrexate .  She is continuing to try to work on her exercise as a part of preparation for bariatric surgery also awaiting upcoming cardiac evaluation.     Previous HPI 12/02/21 Monique Clark is a 58 y.o. female here for muscle pain and weakness in shoulders and legs concern for history of PMR previously treated with long term prednisone .  She has a history of CVID on IVIG, non hodgkins lymphoma, also has history of bilateral hip replacement. Recently had microdiscectomy about 6 weeks prior. Started prednisone  40 mg daily last month decreased to 20 mg daily and now is down to 10 mg dose. She felt her symptoms improve about 80-90% when starting prednisone  again and tapering has been okay, only slight increase with this dose reduction. She does not see any particular swelling, discoloration, or  rashes. She has noticed some hand tremor more when using her hands that is not typical for her.   Review of Systems  Constitutional:  Positive for fatigue.  HENT:  Positive for mouth dryness. Negative for mouth sores.   Eyes:  Positive for dryness.  Respiratory:  Negative for shortness of breath.   Cardiovascular:  Positive for palpitations. Negative for chest pain.  Gastrointestinal:  Positive for constipation and diarrhea. Negative for blood in stool.  Endocrine: Negative for increased urination.  Genitourinary:  Negative for involuntary urination.  Musculoskeletal:  Positive for joint pain, gait problem, joint pain, joint swelling, myalgias, muscle weakness, morning stiffness, muscle tenderness and myalgias.  Skin:  Negative for color change,  rash, hair loss and sensitivity to sunlight.  Allergic/Immunologic: Positive for susceptible to infections.  Neurological:  Positive for headaches. Negative for dizziness.  Hematological:  Negative for swollen glands.  Psychiatric/Behavioral:  Positive for sleep disturbance. Negative for depressed mood. The patient is not nervous/anxious.     PMFS History:  Patient Active Problem List   Diagnosis Date Noted   Cataracts, bilateral 08/09/2024   Chronic pain syndrome 03/28/2024   Encounter for opiate analgesic use agreement 03/28/2024   Encounter for medication management 03/28/2024   Chronic bilateral low back pain with left-sided sciatica 03/28/2024   Pain in left ankle and joints of left foot 11/24/2023   Candida esophagitis (HCC) 11/02/2023   Exposure to the flu 11/02/2023   Encounter for general adult medical examination with abnormal findings 03/17/2023   Recurrent fracture of lumbar vertebra (HCC) 03/01/2023   History of lumbar laminectomy for spinal cord decompression 02/27/2023   Seronegative rheumatoid arthritis (HCC) 11/17/2022   Nocturnal hypoxemia 08/29/2022   Pulmonary nodule 08/16/2022   Recurrent herniation of lumbar disc  07/25/2022   OSA on CPAP 05/19/2022   Impingement syndrome of left shoulder 05/13/2022   Leukocytosis 01/20/2022   Morbid obesity (HCC) 01/01/2022   Chronic fatigue and immune dysfunction syndrome 01/01/2022   High risk medication use 12/02/2021   PMR (polymyalgia rheumatica) 11/05/2021   Status post lumbar spine operative procedure for decompression of spinal cord 09/27/2021   Insomnia 05/28/2021   COVID-19 05/07/2021   Malignant (primary) neoplasm, unspecified (HCC) 03/08/2021   Personal history of urinary (tract) infections 03/08/2021   Personal history of pneumonia (recurrent) 03/08/2021   Elevated serum hCG 07/02/2020   POTS (postural orthostatic tachycardia syndrome) 03/08/2020   Mixed hyperlipidemia 03/21/2019   Paroxysmal atrial fibrillation (HCC)    Localized swelling of left lower extremity    Essential hypertension    Mitral valve regurgitation 06/22/2017   CVID (common variable immunodeficiency) (HCC) 05/07/2015   Prediabetes 01/20/2013   History of non-Hodgkin's lymphoma 12/21/2012    Past Medical History:  Diagnosis Date   Abscess    Arthritis    Asthma    November 2023- hospitalized   Atrial fibrillation (HCC)    Cardiac arrhythmia due to congenital heart disease    trace MR 08/15/20 echo ([place])- pt denies congenital heart disease   Cellulitis 03/2024   Cervical lymphadenopathy    CVID (common variable immunodeficiency) (HCC) 2015   takes Hizentra    Diverticulosis    Elevated cholesterol    GERD (gastroesophageal reflux disease)    Headache    High blood pressure    Non Hodgkin's lymphoma (HCC) 1998   Obesity    Personal history of gestational diabetes    PMR (polymyalgia rheumatica)    takes Kevzara    Pneumonia    PONV (postoperative nausea and vomiting)    POTS (postural orthostatic tachycardia syndrome)    Pre-diabetes    Sleep apnea    CPAP with 2L O2   UTI (urinary tract infection)    UTI (urinary tract infection) 03/2024     Family History  Problem Relation Age of Onset   Atrial fibrillation Mother    Sleep apnea Mother    Colon cancer Father    Cancer Father        Colon   Colon cancer Sister    Basal cell carcinoma Brother    Atrial fibrillation Maternal Uncle    Atrial fibrillation Maternal Grandmother    Atrial fibrillation Maternal Grandfather    Heart attack  Maternal Grandfather    Ovarian cancer Neg Hx    Stomach cancer Neg Hx    Rectal cancer Neg Hx    Past Surgical History:  Procedure Laterality Date   ABDOMINAL HYSTERECTOMY     APPENDECTOMY     CESAREAN SECTION     CHOLECYSTECTOMY     COLONOSCOPY     HEMATOMA EVACUATION N/A 03/04/2023   Procedure: EVACUATION HEMATOMA SEROMA WITH RECLOSURE LUMBAR INCISION;  Surgeon: Barbarann Oneil BROCKS, MD;  Location: [facility] OR;  Service: Orthopedics;  Laterality: N/A;   JOINT REPLACEMENT Left    hip   JOINT REPLACEMENT Right    Right Hip 2022   LAPAROSCOPIC GASTRIC SLEEVE RESECTION  04/29/2022   left shoulder repair     rotator cuff repair   LUMBAR LAMINECTOMY N/A 09/18/2021   Procedure: RIGHT LUMBAR FOUR-FIVE MICRODISCECTOMY;  Surgeon: Barbarann Oneil BROCKS, MD;  Location: [facility] OR;  Service: Orthopedics;  Laterality: N/A;   LUMBAR LAMINECTOMY/DECOMPRESSION MICRODISCECTOMY N/A 02/27/2023   Procedure: L4-5 LUMBAR DECOMPRESSION, MICRODISCECTOMY;  Surgeon: Barbarann Oneil BROCKS, MD;  Location: [facility] OR;  Service: Orthopedics;  Laterality: N/A;   LUNG REMOVAL, PARTIAL Right 2009   VATS, wedge resection partial lobectomy, done at [facility]   myringostomy Bilateral    Myringotomy   SPLENECTOMY, TOTAL  1998   TONSILLECTOMY AND ADENOIDECTOMY     UPPER GI ENDOSCOPY N/A 04/29/2022   Procedure: UPPER GI ENDOSCOPY;  Surgeon: Stevie, Herlene Righter, MD;  Location: [facility];  Service: General;  Laterality: N/A;   Social History   Social History Narrative   Pt lives alone    Pt works    Immunization History  Administered Date(s) Administered   Influenza-Unspecified 07/27/2020,  07/08/2022   PFIZER(Purple Top)SARS-COV-2 Vaccination 09/24/2019, 10/12/2019, 07/16/2020     Objective: Vital Signs: BP (!) 110/56   Pulse (!) 55   Temp 97.6 F (36.4 C)   Resp 16   Ht 5' 5 (1.651 m)   Wt 297 lb 9.6 oz (135 kg)   BMI 49.52 kg/m    Physical Exam Constitutional:      Appearance: She is obese.  Eyes:     Conjunctiva/sclera: Conjunctivae normal.  Cardiovascular:     Rate and Rhythm: Normal rate and regular rhythm.  Pulmonary:     Effort: Pulmonary effort is normal.     Breath sounds: Normal breath sounds.  Musculoskeletal:     Right lower leg: No edema.     Left lower leg: No edema.  Lymphadenopathy:     Cervical: No cervical adenopathy.  Skin:    General: Skin is warm and dry.     Findings: No rash.  Neurological:     Mental Status: She is alert.  Psychiatric:        Mood and Affect: Mood normal.      Musculoskeletal Exam:  Mild shoulder tenderness, full ROM and no swelling Left worse than right elbow extension range of motion limited by about 25-30 degrees, no focal tenderness or swelling Wrists full ROM no tenderness or swelling Fingers full ROM no tenderness or swelling Bilateral paraspinal tenderness to pressure throughout middle and lower back Hip internal rotation slightly restricted on left side, with lateral tenderness to pressure Knees full ROM no tenderness or swelling    Investigation: No additional findings.  Imaging: No results found.  Recent Labs: Lab Results  Component Value Date   WBC 9.1 05/09/2024   HGB 13.2 05/09/2024   PLT 278 05/09/2024   NA 139 05/09/2024  K 4.8 05/09/2024   CL 103 05/09/2024   CO2 29 05/09/2024   GLUCOSE 84 05/09/2024   BUN 17 05/09/2024   CREATININE 0.78 05/09/2024   BILITOT 0.4 05/09/2024   ALKPHOS 70 08/16/2022   AST 23 05/09/2024   ALT 16 05/09/2024   PROT 6.6 05/09/2024   ALBUMIN 3.6 08/16/2022   CALCIUM  9.5 05/09/2024   GFRAA >60 03/18/2020   QFTBGOLDPLUS NEGATIVE 09/14/2023     Speciality Comments: No specialty comments available.  Procedures:  No procedures performed Allergies: Chlorhexidine , Tetanus-diphtheria toxoids td, Nizatidine, Promethazine , and Tetanus toxoid-containing vaccines   Assessment / Plan:     Visit Diagnoses: PMR (polymyalgia rheumatica) Seronegative RA - Plan: Sedimentation rate, C-reactive protein Rheumatoid arthritis with right elbow tendinitis and right cubital tunnel syndrome Elbow restriction and nerve impingement. Normal sedimentation rate, elevated CRP likely due to Kevzara .. Limited extension and tenderness at the medial and lateral epicondyle, consistent with tendinitis. Otherwise proximal joints hips/shoulders doing well today not indicating PMR activity. - Recheck sed rate and CRP today. - Consider temporary steroid treatment if inflammatory markers remain elevated. - Discuss potential adjustment of Kevzara  dosing to 162 mg subcu once weekly if necessary.  High risk medication use - Kevzara  200 mg subcu q. 14 days. - Plan: CBC with Differential/Platelet, Comprehensive metabolic panel with GFR, QuantiFERON-TB Gold Plus Tolerating medication well without issue, no serious interval infections. - Checking CBC and CMP for medication monitoring on continued Kevzara  - Checking annual TB screening last normal 08/2023  Age-related cataract of both eyes, unspecified age-related cataract type Currently mostly symptoms with night vision. Recommended seeing new ophthalmologist after last appt with [facility] provider got cancelled.  Orders: Orders Placed This Encounter  Procedures   Sedimentation rate   C-reactive protein   CBC with Differential/Platelet   Comprehensive metabolic panel with GFR   QuantiFERON-TB Gold Plus   No orders of the defined types were placed in this encounter.    Follow-Up Instructions: Return in about 3 months (around 11/09/2024) for PMR on KEV f/u 3mos.   Lonni LELON Ester, MD  Note - This record  has been created using [employer].  Chart creation errors have been sought, but may not always  have been located. Such creation errors do not reflect on  the standard of medical care.

## 2024-07-31 ENCOUNTER — Other Ambulatory Visit: Payer: Self-pay | Admitting: Physical Medicine and Rehabilitation

## 2024-07-31 DIAGNOSIS — D801 Nonfamilial hypogammaglobulinemia: Secondary | ICD-10-CM | POA: Diagnosis not present

## 2024-08-01 ENCOUNTER — Encounter: Payer: Self-pay | Admitting: Radiology

## 2024-08-01 DIAGNOSIS — D801 Nonfamilial hypogammaglobulinemia: Secondary | ICD-10-CM | POA: Diagnosis not present

## 2024-08-03 ENCOUNTER — Encounter: Payer: Self-pay | Admitting: Physical Medicine and Rehabilitation

## 2024-08-03 DIAGNOSIS — C8841 Extranodal marginal zone b-cell lymphoma of mucosa-associated lymphoid tissue (malt-lymphoma), in remission: Secondary | ICD-10-CM | POA: Diagnosis not present

## 2024-08-03 DIAGNOSIS — D801 Nonfamilial hypogammaglobulinemia: Secondary | ICD-10-CM | POA: Diagnosis not present

## 2024-08-04 MED ORDER — GABAPENTIN 300 MG PO CAPS: 300.0000 mg | ORAL_CAPSULE | Freq: Two times a day (BID) | ORAL | 5 refills | Status: DC

## 2024-08-05 ENCOUNTER — Other Ambulatory Visit (HOSPITAL_COMMUNITY): Payer: Self-pay

## 2024-08-09 ENCOUNTER — Ambulatory Visit: Attending: Internal Medicine | Admitting: Internal Medicine

## 2024-08-09 ENCOUNTER — Other Ambulatory Visit (HOSPITAL_COMMUNITY): Payer: Self-pay

## 2024-08-09 ENCOUNTER — Encounter: Payer: Self-pay | Admitting: Internal Medicine

## 2024-08-09 VITALS — BP 110/56 | HR 55 | Temp 97.6°F | Resp 16 | Ht 65.0 in | Wt 297.6 lb

## 2024-08-09 DIAGNOSIS — M353 Polymyalgia rheumatica: Secondary | ICD-10-CM

## 2024-08-09 DIAGNOSIS — Z79899 Other long term (current) drug therapy: Secondary | ICD-10-CM | POA: Diagnosis not present

## 2024-08-09 DIAGNOSIS — H269 Unspecified cataract: Secondary | ICD-10-CM | POA: Insufficient documentation

## 2024-08-09 DIAGNOSIS — M0600 Rheumatoid arthritis without rheumatoid factor, unspecified site: Secondary | ICD-10-CM

## 2024-08-09 DIAGNOSIS — H259 Unspecified age-related cataract: Secondary | ICD-10-CM

## 2024-08-09 NOTE — Patient Instructions (Signed)
 Consider contacting [facility] about your cataracts:  [address], [address] [place], [place] [address]  [phone]

## 2024-08-11 ENCOUNTER — Ambulatory Visit: Payer: Self-pay | Admitting: Internal Medicine

## 2024-08-11 LAB — CBC WITH DIFFERENTIAL/PLATELET
Absolute Lymphocytes: 3373 {cells}/uL (ref 850–3900)
Absolute Monocytes: 1235 {cells}/uL — ABNORMAL HIGH (ref 200–950)
Basophils Absolute: 48 {cells}/uL (ref 0–200)
Basophils Relative: 0.5 %
Eosinophils Absolute: 76 {cells}/uL (ref 15–500)
Eosinophils Relative: 0.8 %
HCT: 40.1 % (ref 35.0–45.0)
Hemoglobin: 13.3 g/dL (ref 11.7–15.5)
MCH: 29.8 pg (ref 27.0–33.0)
MCHC: 33.2 g/dL (ref 32.0–36.0)
MCV: 89.7 fL (ref 80.0–100.0)
MPV: 12.4 fL (ref 7.5–12.5)
Monocytes Relative: 13 %
Neutro Abs: 4769 {cells}/uL (ref 1500–7800)
Neutrophils Relative %: 50.2 %
Platelets: 350 Thousand/uL (ref 140–400)
RBC: 4.47 Million/uL (ref 3.80–5.10)
RDW: 13.1 % (ref 11.0–15.0)
Total Lymphocyte: 35.5 %
WBC: 9.5 Thousand/uL (ref 3.8–10.8)

## 2024-08-11 LAB — QUANTIFERON-TB GOLD PLUS
Mitogen-NIL: 6.94 [IU]/mL
NIL: 0.01 [IU]/mL
QuantiFERON-TB Gold Plus: NEGATIVE
TB1-NIL: 0 [IU]/mL
TB2-NIL: 0 [IU]/mL

## 2024-08-11 LAB — COMPREHENSIVE METABOLIC PANEL WITH GFR
AG Ratio: 1.5 (calc) (ref 1.0–2.5)
ALT: 12 U/L (ref 6–29)
AST: 20 U/L (ref 10–35)
Albumin: 3.8 g/dL (ref 3.6–5.1)
Alkaline phosphatase (APISO): 72 U/L (ref 37–153)
BUN: 15 mg/dL (ref 7–25)
CO2: 31 mmol/L (ref 20–32)
Calcium: 9.4 mg/dL (ref 8.6–10.4)
Chloride: 102 mmol/L (ref 98–110)
Creat: 0.8 mg/dL (ref 0.50–1.03)
Globulin: 2.5 g/dL (ref 1.9–3.7)
Glucose, Bld: 87 mg/dL (ref 65–99)
Potassium: 4.7 mmol/L (ref 3.5–5.3)
Sodium: 140 mmol/L (ref 135–146)
Total Bilirubin: 0.4 mg/dL (ref 0.2–1.2)
Total Protein: 6.3 g/dL (ref 6.1–8.1)
eGFR: 85 mL/min/1.73m2 (ref >=60)

## 2024-08-11 LAB — SEDIMENTATION RATE: Sed Rate: 50 mm/h — ABNORMAL HIGH (ref 0–30)

## 2024-08-11 LAB — C-REACTIVE PROTEIN: CRP: 48.6 mg/L — ABNORMAL HIGH (ref <8.0)

## 2024-08-11 MED ORDER — PREDNISONE 5 MG PO TABS: ORAL_TABLET | ORAL | 0 refills | Status: AC

## 2024-08-11 NOTE — Telephone Encounter (Signed)
 Contacted patient to advise that We will try to get an insurance approved for weekly Kevzara  (since she has PMR) but if not approved (Which I anticipate), we may switch to weekly Actemra (another IL-6 inhibitor that is approved for RA and not PMR)  Patient verbalized understanding

## 2024-08-11 NOTE — Addendum Note (Signed)
 Addended by: JEANNETTA LONNI ORN on: 08/11/2024 08:03 AM   Modules accepted: Orders

## 2024-08-11 NOTE — Progress Notes (Signed)
 Sedimentation rate is high at 50 and CRP remains high at 48.6.  Blood count kidney function liver function tests are fine and TB screening negative.  For the short-term I will send in a new prescription for prednisone  taper. This also indicates inflammation is not completely controlled so we should try to increase her maintenance medication, if this is not available with the Kevzara  we could switch to the Actemra which has once weekly dosing available but is the same mechanism of action.

## 2024-08-12 ENCOUNTER — Telehealth: Payer: Self-pay

## 2024-08-12 DIAGNOSIS — M0600 Rheumatoid arthritis without rheumatoid factor, unspecified site: Secondary | ICD-10-CM

## 2024-08-12 DIAGNOSIS — Z79899 Other long term (current) drug therapy: Secondary | ICD-10-CM

## 2024-08-12 NOTE — Telephone Encounter (Signed)
 Dayne Sherry RAMAN, RPH-CPP  P Rx Rheum/Pulm Please start urgent PA for weekly Kevzara  (if denied due to non-FDA approved dose), please try for weekly Actemra  Dx for kevzara : PMR and seronegative RA  If Actemra required, please run PA with RA as primary and PMR as secondary ---------------------------------------------------------------------------------------------------------------------------  Submitted an URGENT Prior Authorization request to [facility] for KEVZARA  via CoverMyMeds. Will update once we receive a response.  Key: [id number]

## 2024-08-13 ENCOUNTER — Other Ambulatory Visit (HOSPITAL_COMMUNITY): Payer: Self-pay

## 2024-08-15 ENCOUNTER — Other Ambulatory Visit (HOSPITAL_COMMUNITY): Payer: Self-pay

## 2024-08-15 DIAGNOSIS — D801 Nonfamilial hypogammaglobulinemia: Secondary | ICD-10-CM | POA: Diagnosis not present

## 2024-08-15 NOTE — Telephone Encounter (Signed)
 Clinical questions for Actemra completed

## 2024-08-15 NOTE — Telephone Encounter (Signed)
 Received fax from [employer] requesting two high-quality articles from major peer-reviewed medical journal supporting safety and efficacy of requestde drug with once weekly dosing  PA Ref # [id number]  Unable to locate second study. Only study located was SARIL-RA-MOBILITY Part A, a Phase 2 study  Will move forward with Actemra  Submitted an URGENT Prior Authorization request to [facility] for ACTEMRA SQ via CoverMyMeds. Pending clinical questions to populate  Key: [id number]

## 2024-08-16 ENCOUNTER — Other Ambulatory Visit (HOSPITAL_COMMUNITY): Payer: Self-pay

## 2024-08-16 ENCOUNTER — Other Ambulatory Visit: Payer: Self-pay

## 2024-08-16 MED ORDER — GABAPENTIN 300 MG PO CAPS
300.0000 mg | ORAL_CAPSULE | Freq: Two times a day (BID) | ORAL | 5 refills | Status: DC
  Filled 2024-08-16: qty 60, 25d supply, fill #0
  Filled 2024-09-19: qty 60, 25d supply, fill #1

## 2024-08-16 MED ORDER — ACTEMRA ACTPEN 162 MG/0.9ML ~~LOC~~ SOAJ
162.0000 mg | SUBCUTANEOUS | 0 refills | Status: AC
  Filled 2024-08-18: qty 3.6, 28d supply, fill #0
  Filled 2024-09-12: qty 3.6, 28d supply, fill #1
  Filled 2024-10-10: qty 3.6, 28d supply, fill #2

## 2024-08-16 NOTE — Telephone Encounter (Signed)
 Received notification from [facility] regarding a prior authorization for ACTEMRA SQ. Authorization has been APPROVED from 08/16/2024 to 02/12/2025. Approval letter sent to scan center.  Patient must fill through [facility]: [phone]   Authorization # [id number]  Patient enrolled into Actemra copay card: ID: [id number] Group : [id number] RxBIN: [id number] PCN: [id number] If you have any questions, please call [phone] RA-COPAY [phone]).  Spoke with patient - she is due for Kevzara  this week for which she has a dose. She will take her Kevzara  this week and plan to start Actemra next week once received. Advised her that [facility] will call to set up first shipment to her home.  I did review that Actemra is not formally approved for PMR but it is an IL-6 similar to Kevzara  so may theoretically approve. She inquired if she failed Actemra for PMR, if we'd be able to try for weekly Kevzara  again. Advised that there is no literature supporting this non-FDA approved dosing frequency so will be unlikely to be approved.  Sherry Pennant, PharmD, MPH, BCPS, CPP Clinical Pharmacist [facility])

## 2024-08-16 NOTE — Progress Notes (Signed)
 Removing refill task for kevzara , therapy change pending.

## 2024-08-16 NOTE — Addendum Note (Signed)
 Addended by: EMELINE SEARCH on: 08/16/2024 01:33 AM   Modules accepted: Orders

## 2024-08-17 ENCOUNTER — Ambulatory Visit

## 2024-08-17 ENCOUNTER — Encounter: Payer: Self-pay | Admitting: Registered Nurse

## 2024-08-17 ENCOUNTER — Ambulatory Visit
Admission: RE | Admit: 2024-08-17 | Discharge: 2024-08-17 | Disposition: A | Discharge status: Home / Self Care | Source: Ambulatory Visit | Attending: Registered Nurse | Admitting: Registered Nurse

## 2024-08-17 ENCOUNTER — Ambulatory Visit: Admitting: Podiatry

## 2024-08-17 ENCOUNTER — Encounter: Payer: Self-pay | Admitting: Podiatry

## 2024-08-17 ENCOUNTER — Encounter: Attending: Physical Medicine and Rehabilitation | Admitting: Registered Nurse

## 2024-08-17 VITALS — BP 119/69 | HR 61 | Ht 65.0 in | Wt 297.0 lb

## 2024-08-17 DIAGNOSIS — Z5181 Encounter for therapeutic drug level monitoring: Secondary | ICD-10-CM | POA: Insufficient documentation

## 2024-08-17 DIAGNOSIS — M25561 Pain in right knee: Secondary | ICD-10-CM | POA: Insufficient documentation

## 2024-08-17 DIAGNOSIS — M25572 Pain in left ankle and joints of left foot: Secondary | ICD-10-CM

## 2024-08-17 DIAGNOSIS — M545 Low back pain, unspecified: Secondary | ICD-10-CM | POA: Insufficient documentation

## 2024-08-17 DIAGNOSIS — T148XXA Other injury of unspecified body region, initial encounter: Secondary | ICD-10-CM | POA: Insufficient documentation

## 2024-08-17 DIAGNOSIS — M7752 Other enthesopathy of left foot: Secondary | ICD-10-CM | POA: Diagnosis not present

## 2024-08-17 DIAGNOSIS — G8929 Other chronic pain: Secondary | ICD-10-CM | POA: Diagnosis not present

## 2024-08-17 DIAGNOSIS — M5416 Radiculopathy, lumbar region: Secondary | ICD-10-CM | POA: Insufficient documentation

## 2024-08-17 DIAGNOSIS — M25562 Pain in left knee: Secondary | ICD-10-CM | POA: Insufficient documentation

## 2024-08-17 DIAGNOSIS — M79672 Pain in left foot: Secondary | ICD-10-CM | POA: Insufficient documentation

## 2024-08-17 DIAGNOSIS — Z79899 Other long term (current) drug therapy: Secondary | ICD-10-CM | POA: Insufficient documentation

## 2024-08-17 DIAGNOSIS — M5116 Intervertebral disc disorders with radiculopathy, lumbar region: Secondary | ICD-10-CM | POA: Diagnosis not present

## 2024-08-17 DIAGNOSIS — M722 Plantar fascial fibromatosis: Secondary | ICD-10-CM | POA: Diagnosis not present

## 2024-08-17 DIAGNOSIS — G894 Chronic pain syndrome: Secondary | ICD-10-CM | POA: Insufficient documentation

## 2024-08-17 DIAGNOSIS — M25512 Pain in left shoulder: Secondary | ICD-10-CM | POA: Insufficient documentation

## 2024-08-17 DIAGNOSIS — M898X7 Other specified disorders of bone, ankle and foot: Secondary | ICD-10-CM

## 2024-08-17 DIAGNOSIS — M25511 Pain in right shoulder: Secondary | ICD-10-CM | POA: Diagnosis not present

## 2024-08-17 MED ORDER — TRIAMCINOLONE ACETONIDE 40 MG/ML IJ SUSP
40.0000 mg | Freq: Once | INTRAMUSCULAR | Status: AC
  Administered 2024-08-17: 40 mg

## 2024-08-17 MED ORDER — OXYCODONE-ACETAMINOPHEN 5-325 MG PO TABS
1.0000 | ORAL_TABLET | Freq: Three times a day (TID) | ORAL | 0 refills | Status: DC | PRN
Start: 2024-08-17 — End: 2024-08-17

## 2024-08-17 MED ORDER — OXYCODONE-ACETAMINOPHEN 5-325 MG PO TABS: 1.0000 | ORAL_TABLET | Freq: Three times a day (TID) | ORAL | 0 refills | Status: DC | PRN

## 2024-08-17 NOTE — Progress Notes (Signed)
 Patient presents with complaint of pain in the left foot pain along the dorsal midfoot.  Some pain along the arch of the foot also.  Began about a month or 2 ago.  Does not recall any specific injury to the foot or having problems with that in the past.  She does have a back problems and has had 2 hip replacements.  She is on Xaralto so cannot take NSAIDs.  Has not noticed any redness or ecchymosis in the foot.   Physical exam:  General appearance: Pleasant, and in no acute distress. AOx3.  Vascular: Pedal pulses: DP 2/4 bilaterally, PT 2/4 bilaterally. Moderate edema lower legs bilaterally. Capillary fill time immediate bilaterally.  Neurological: Light touch intact feet bilaterally.  Normal Achilles reflex bilaterally.  No clonus or spasticity noted.  Negative Tinel sign tarsal tunnel and porta pedis bilaterally  Dermatologic:   Skin normal temperature bilaterally.  Skin normal color, tone, and texture bilaterally.   Musculoskeletal: There is at the talonavicular joint left tenderness at the first tarsometatarsal joint left.  Tenderness along the middle one third of the plantar fascia left.  No fibromas noted.  Radiographs: 3 views left foot: Severe joint space narrowing at the talonavicular joint with severe osteophytic changes around the joint.  Joint spacing at the second tarsometatarsal joint.  Pes planus with sagging at the talonavicular joint left no is any new fractures or bone tumors.  Diagnosis: 1.  Plantar fasciitis left 2.  Arthralgia talonavicular joint and TMT 2 left 3.  Exostosis left foot  Plan: -New patient office visit for evaluation and management level 3.  Modifier 25. - Discussed with her the pain in the foot due to the talonavicular joint and TMT 2 joint collapse and also the plantar fasciitis.  Discussed this issue with her.  Long-term recommend custom orthotics.  Discussed the severe derangement of the joint at the talonavicular joint.  This could be from an old  avascular necrosis or soft coalition that led to arthritic changes or an old fracture. -Will schedule with Trish for custom orthotics next available -injected 3cc 2:1 mixture 0.5 cc Marcaine : Triamcinolone  40mg /53ml at talonavicular joint left.    Return 2 weeks follow-up injection TN joint left

## 2024-08-17 NOTE — Progress Notes (Signed)
 Subjective:    Patient ID: Monique Clark, female    DOB: 04-17-1966, 58 y.o.   MRN: [id number]  HPI: Monique Clark is a 58 y.o. female who returns for follow up appointment for chronic pain and medication refill. She states her pain is located in her bilateral shoulders, lower back radiating into her left hip,  bilateral knees and left foot pain. Monique Clark reports increase in left ankle pain, she had a Xray  11/19/2023:  MPRESSION: Probable old avulsion fracture or secondary ossification center adjacent to the lateral malleolus and lateral talus. There is overlying soft tissue swelling, but no definite acute fracture seen.  Referral placed for Podiatry, she will call today to schedule an appointment, she verbalizes understanding. She denies falling.   She rates her pain 4. Her current exercise regime is walking and performing stretching exercises.  Monique Clark Morphine  equivalent is 22.50  MME.   Oral Swab Performed today.      Pain Inventory Average Pain 4 Pain Right Now 4 My pain is intermittent, constant, and aching  In the last 24 hours, has pain interfered with the following? General activity 9 Relation with others 0 Enjoyment of life 6 What TIME of day is your pain at its worst? morning , daytime, evening, and night Sleep (in general) Fair  Pain is worse with: walking, bending, sitting, standing, and some activites Pain improves with: rest, heat/ice, and medication Relief from Meds: 6  Family History  Problem Relation Age of Onset   Atrial fibrillation Mother    Sleep apnea Mother    Colon cancer Father    Cancer Father        Colon   Colon cancer Sister    Basal cell carcinoma Brother    Atrial fibrillation Maternal Uncle    Atrial fibrillation Maternal Grandmother    Atrial fibrillation Maternal Grandfather    Heart attack Maternal Grandfather    Ovarian cancer Neg Hx    Stomach cancer Neg Hx    Rectal cancer Neg Hx    Social History    Socioeconomic History   Marital status: Married    Spouse name: Not on file   Number of children: 3   Years of education: Not on file   Highest education level: Doctorate  Occupational History   Occupation: rn  Tobacco Use   Smoking status: Former    Current packs/day: 0.00    Average packs/day: 0.5 packs/day for 20.0 years (10.0 ttl pk-yrs)    Types: Cigarettes    Start date: 09/29/1982    Quit date: 09/29/2002    Years since quitting: 21.8    Passive exposure: Current   Smokeless tobacco: Never  Vaping Use   Vaping status: Never Used  Substance and Sexual Activity   Alcohol use: Yes    Comment: once a month- rarely   Drug use: No   Sexual activity: Yes    Birth control/protection: Surgical, Post-menopausal    Comment: 1st intercourse 66 yo-5 partners  Other Topics Concern   Not on file  Social History Narrative   Pt lives alone    Pt works    Social Drivers of Corporate Investment Banker Strain: Low Risk  (03/13/2023)   Overall Financial Resource Strain (CARDIA)    Difficulty of Paying Living Expenses: Not very hard  Food Insecurity: No Food Insecurity (03/13/2023)   Hunger Vital Sign    Worried About Running Out of Food in the Last Year: Never true  Ran Out of Food in the Last Year: Never true  Transportation Needs: No Transportation Needs (03/13/2023)   PRAPARE - Administrator, Civil Service (Medical): No    Lack of Transportation (Non-Medical): No  Physical Activity: Unknown (03/13/2023)   Exercise Vital Sign    Days of Exercise per Week: 0 days    Minutes of Exercise per Session: Not on file  Stress: Stress Concern Present (03/13/2023)   [employer] of Occupational Health - Occupational Stress Questionnaire    Feeling of Stress : To some extent  Social Connections: Moderately Isolated (03/13/2023)   Social Connection and Isolation Panel    Frequency of Communication with Friends and Family: Twice a week    Frequency of Social Gatherings with  Friends and Family: Once a week    Attends Religious Services: Never    Database Administrator or Organizations: No    Attends Engineer, Structural: Not on file    Marital Status: Married   Past Surgical History:  Procedure Laterality Date   ABDOMINAL HYSTERECTOMY     APPENDECTOMY     CESAREAN SECTION     CHOLECYSTECTOMY     COLONOSCOPY     HEMATOMA EVACUATION N/A 03/04/2023   Procedure: EVACUATION HEMATOMA SEROMA WITH RECLOSURE LUMBAR INCISION;  Surgeon: Barbarann Oneil BROCKS, MD;  Location: [facility];  Service: Orthopedics;  Laterality: N/A;   JOINT REPLACEMENT Left    hip   JOINT REPLACEMENT Right    Right Hip 2022   LAPAROSCOPIC GASTRIC SLEEVE RESECTION  04/29/2022   left shoulder repair     rotator cuff repair   LUMBAR LAMINECTOMY N/A 09/18/2021   Procedure: RIGHT LUMBAR FOUR-FIVE MICRODISCECTOMY;  Surgeon: Barbarann Oneil BROCKS, MD;  Location: [facility];  Service: Orthopedics;  Laterality: N/A;   LUMBAR LAMINECTOMY/DECOMPRESSION MICRODISCECTOMY N/A 02/27/2023   Procedure: L4-5 LUMBAR DECOMPRESSION, MICRODISCECTOMY;  Surgeon: Barbarann Oneil BROCKS, MD;  Location: [facility];  Service: Orthopedics;  Laterality: N/A;   LUNG REMOVAL, PARTIAL Right 2009   VATS, wedge resection partial lobectomy, done at [facility]   myringostomy Bilateral    Myringotomy   SPLENECTOMY, TOTAL  1998   TONSILLECTOMY AND ADENOIDECTOMY     UPPER GI ENDOSCOPY N/A 04/29/2022   Procedure: UPPER GI ENDOSCOPY;  Surgeon: Stevie, Herlene Righter, MD;  Location: [facility];  Service: General;  Laterality: N/A;   Past Surgical History:  Procedure Laterality Date   ABDOMINAL HYSTERECTOMY     APPENDECTOMY     CESAREAN SECTION     CHOLECYSTECTOMY     COLONOSCOPY     HEMATOMA EVACUATION N/A 03/04/2023   Procedure: EVACUATION HEMATOMA SEROMA WITH RECLOSURE LUMBAR INCISION;  Surgeon: Barbarann Oneil BROCKS, MD;  Location: [facility];  Service: Orthopedics;  Laterality: N/A;   JOINT REPLACEMENT Left    hip   JOINT REPLACEMENT Right    Right Hip 2022    LAPAROSCOPIC GASTRIC SLEEVE RESECTION  04/29/2022   left shoulder repair     rotator cuff repair   LUMBAR LAMINECTOMY N/A 09/18/2021   Procedure: RIGHT LUMBAR FOUR-FIVE MICRODISCECTOMY;  Surgeon: Barbarann Oneil BROCKS, MD;  Location: [facility];  Service: Orthopedics;  Laterality: N/A;   LUMBAR LAMINECTOMY/DECOMPRESSION MICRODISCECTOMY N/A 02/27/2023   Procedure: L4-5 LUMBAR DECOMPRESSION, MICRODISCECTOMY;  Surgeon: Barbarann Oneil BROCKS, MD;  Location: [facility];  Service: Orthopedics;  Laterality: N/A;   LUNG REMOVAL, PARTIAL Right 2009   VATS, wedge resection partial lobectomy, done at [facility]   myringostomy Bilateral    Myringotomy  SPLENECTOMY, TOTAL  1998   TONSILLECTOMY AND ADENOIDECTOMY     UPPER GI ENDOSCOPY N/A 04/29/2022   Procedure: UPPER GI ENDOSCOPY;  Surgeon: Stevie, Herlene Righter, MD;  Location: [facility];  Service: General;  Laterality: N/A;   Past Medical History:  Diagnosis Date   Abscess    Arthritis    Asthma    November 2023- hospitalized   Atrial fibrillation (HCC)    Cardiac arrhythmia due to congenital heart disease    trace MR 08/15/20 echo ([place])- pt denies congenital heart disease   Cellulitis 03/2024   Cervical lymphadenopathy    CVID (common variable immunodeficiency) (HCC) 2015   takes Hizentra    Diverticulosis    Elevated cholesterol    GERD (gastroesophageal reflux disease)    Headache    High blood pressure    Non Hodgkin's lymphoma (HCC) 1998   Obesity    Personal history of gestational diabetes    PMR (polymyalgia rheumatica)    takes Kevzara    Pneumonia    PONV (postoperative nausea and vomiting)    POTS (postural orthostatic tachycardia syndrome)    Pre-diabetes    Sleep apnea    CPAP with 2L O2   UTI (urinary tract infection)    UTI (urinary tract infection) 03/2024   There were no vitals taken for this visit.  Opioid Risk Score:   Fall Risk Score:  `1  Depression screen [facility] 2/9     06/21/2024   10:32 AM 04/22/2024    9:20 AM  03/28/2024    9:32 AM 03/16/2023    1:10 PM 09/18/2022   10:45 AM 08/28/2022   10:42 AM 08/28/2022   10:41 AM  Depression screen PHQ 2/9  Decreased Interest 1 1 0 0 0 0 0  Down, Depressed, Hopeless 0 1 1 0 0 0 0  PHQ - 2 Score 1 2 1  0 0 0 0  Altered sleeping 2  0   0   Tired, decreased energy 2  3   0   Change in appetite 0  1   0   Feeling bad or failure about yourself  0  0   0   Trouble concentrating 0  0   0   Moving slowly or fidgety/restless 0  0   0   Suicidal thoughts 0  0   0   PHQ-9 Score 5   5    0    Difficult doing work/chores Very difficult  Very difficult   Not difficult at all      Data saved with a previous flowsheet row definition    Review of Systems  Musculoskeletal:  Positive for back pain and gait problem.       Pain in both hips, shoulder, left foot  All other systems reviewed and are negative.      Objective:   Physical Exam Vitals and nursing note reviewed.  Constitutional:      Appearance: She is obese.  Cardiovascular:     Rate and Rhythm: Normal rate and regular rhythm.     Pulses: Normal pulses.     Heart sounds: Normal heart sounds.  Pulmonary:     Effort: Pulmonary effort is normal.     Breath sounds: Normal breath sounds.  Musculoskeletal:     Comments: Normal Muscle Bulk and Muscle Testing Reveals:  Upper Extremities: Full ROM and Muscle Strength 5/5 Bilateral AC Joint Tenderness  Thoracic Paraspinal Tenderness: T-4-T-6  Lumbar Paraspinal Tenderness Bilateral Greater Trochanter Tenderness Lower Extremities:  Decreased ROM and Muscle Strength 5/5 Right Lower Extremity Flexion Produces Pain into her Right Patella Left Lower Extremity Flexion Produces Pain into her Left Foot Arises from Table Slowly Antalgic  Gait     Skin:    General: Skin is warm and dry.  Neurological:     Mental Status: She is oriented to person, place, and time.  Psychiatric:        Mood and Affect: Mood normal.           Assessment & Plan:  Left  Lumbar Radiculitis/ Acute Exacerbation of Chronic low back pain: RX: Lumbar X-ray.  Chronic Bilateral  Shoulder Pain: Continue HEP as Tolerated. Continue to Monitor. 11/19 /2025 3. Chronic Bilateral Knee Pain: Continue HEP as Tolerated. Continue to Monitor. 08/17/2024 4. Chronic Left Foot Pain:  Foot Pain: Continue HEP as Tolerated. Continue current medication regimen. Continue to monitor. 08/17/2024 5. Left Ankle Pain: Increase intensity of pain: Xray from 11/19/2023: Probable old avulsion fracture or secondary ossification center adjacent to the lateral malleolus and lateral talus. There is overlying soft tissue swelling, but no definite acute fracture seen. Podiatry referral placed, Monique Clark verbalizes understanding.  Fall at Home: No falls this month. Educated on falls prevention, she verbalizes understanding. Continue to monitor.  Chronic Pain syndrome: Continue Gabapentin . Will speak with Dr Emeline regarding Monique Clark, she verbalizes understanding. Refilled: I Oxycodone  5mg /325 one tablet three timesa day as needed for pain # 90. Second script sent for the following month. We will continue the opioid monitoring program, this consists of regular clinic visits, examinations, urine drug screen, pill counts as well as use of [place]  Controlled Substance Reporting system. A 12 month History has been reviewed on the [place]  Controlled Substance Reporting System on 08/17/2024 F/U in 2 months

## 2024-08-18 ENCOUNTER — Other Ambulatory Visit: Payer: Self-pay

## 2024-08-18 ENCOUNTER — Other Ambulatory Visit (HOSPITAL_COMMUNITY): Payer: Self-pay

## 2024-08-18 NOTE — Progress Notes (Signed)
 Specialty Pharmacy Initial Fill Coordination Note  Monique Clark is a 58 y.o. female contacted today regarding initial fill of specialty medication(s) Tocilizumab  (Actemra  ACTPen)   Patient requested Delivery   Delivery date: 08/23/24   Verified address: [address]   [place] [address]   Medication will be filled on: 08/22/24   Patient is enrolled into copay card program and is aware of copayment.

## 2024-08-19 ENCOUNTER — Encounter: Payer: Self-pay | Admitting: Podiatry

## 2024-08-19 NOTE — Progress Notes (Signed)
 Patient switching from Kevzara  (for PMR and RA) to Actemra  (RA)  She is due for Kevzara  this week for which she has a dose. She will take her Kevzara  this week and plan to start Actemra  next week once received. Advised her that [facility] will call to set up first shipment to her home.   I did review that Actemra  is not formally approved for PMR but it is an IL-6 similar to Kevzara  so may theoretically approve. She inquired if she failed Actemra  for PMR, if we'd be able to try for weekly Kevzara  again. Advised that there is no literature supporting this non-FDA approved dosing frequency so will be unlikely to be approved.  Sherry Pennant, PharmD, MPH, BCPS, CPP Clinical Pharmacist [facility])

## 2024-08-21 LAB — DRUG TOX ALC METAB W/CON, ORAL FLD: Alcohol Metabolite: NEGATIVE ng/mL (ref <25)

## 2024-08-21 LAB — DRUG TOX MONITOR 1 W/CONF, ORAL FLD
Amphetamines: NEGATIVE ng/mL (ref <10)
Barbiturates: NEGATIVE ng/mL (ref <10)
Benzodiazepines: NEGATIVE ng/mL (ref <0.50)
Buprenorphine: NEGATIVE ng/mL (ref <0.10)
Cocaine: NEGATIVE ng/mL (ref <5.0)
Codeine: NEGATIVE ng/mL (ref <2.5)
Cotinine: 19.9 ng/mL — ABNORMAL HIGH (ref <5.0)
Dihydrocodeine: NEGATIVE ng/mL (ref <2.5)
Fentanyl: NEGATIVE ng/mL (ref <0.10)
Heroin Metabolite: NEGATIVE ng/mL (ref <1.0)
Hydrocodone: NEGATIVE ng/mL (ref <2.5)
Hydromorphone: NEGATIVE ng/mL (ref <2.5)
MARIJUANA: NEGATIVE ng/mL (ref <2.5)
MDMA: NEGATIVE ng/mL (ref <10)
Meprobamate: NEGATIVE ng/mL (ref <2.5)
Methadone: NEGATIVE ng/mL (ref <5.0)
Morphine: NEGATIVE ng/mL (ref <2.5)
Nicotine Metabolite: POSITIVE ng/mL — AB (ref <5.0)
Norhydrocodone: NEGATIVE ng/mL (ref <2.5)
Noroxycodone: 7.3 ng/mL — ABNORMAL HIGH (ref <2.5)
Opiates: POSITIVE ng/mL — AB (ref <2.5)
Oxycodone: 28.5 ng/mL — ABNORMAL HIGH (ref <2.5)
Oxymorphone: NEGATIVE ng/mL (ref <2.5)
Phencyclidine: NEGATIVE ng/mL (ref <10)
Tapentadol: NEGATIVE ng/mL (ref <5.0)
Tramadol: NEGATIVE ng/mL (ref <5.0)
Zolpidem: NEGATIVE ng/mL (ref <5.0)

## 2024-08-22 ENCOUNTER — Ambulatory Visit: Admitting: Internal Medicine

## 2024-08-22 ENCOUNTER — Encounter: Payer: Self-pay | Admitting: Internal Medicine

## 2024-08-22 VITALS — BP 120/70 | HR 62 | Temp 98.5°F | Ht 65.0 in | Wt 297.0 lb

## 2024-08-22 DIAGNOSIS — R7303 Prediabetes: Secondary | ICD-10-CM | POA: Diagnosis not present

## 2024-08-22 DIAGNOSIS — M25572 Pain in left ankle and joints of left foot: Secondary | ICD-10-CM | POA: Diagnosis not present

## 2024-08-22 DIAGNOSIS — E782 Mixed hyperlipidemia: Secondary | ICD-10-CM

## 2024-08-22 DIAGNOSIS — I480 Paroxysmal atrial fibrillation: Secondary | ICD-10-CM

## 2024-08-22 DIAGNOSIS — G4733 Obstructive sleep apnea (adult) (pediatric): Secondary | ICD-10-CM

## 2024-08-22 DIAGNOSIS — Z0001 Encounter for general adult medical examination with abnormal findings: Secondary | ICD-10-CM | POA: Diagnosis not present

## 2024-08-22 NOTE — Patient Instructions (Signed)
 [phone] fax number

## 2024-08-22 NOTE — Progress Notes (Signed)
" ° °  Subjective:   Patient ID: Monique Clark, female    DOB: 19-Jun-1966, 58 y.o.   MRN: [id number]  The patient is here for physical. Pertinent topics discussed: Discussed the use of AI scribe software for clinical note transcription with the patient, who gave verbal consent to proceed. History of Present Illness Monique Clark is a 58 year old female with avascular necrosis who presents with persistent ankle pain.  She experiences significant pain in her ankle, and her doctor suspects it may be due to avascular necrosis. A corticosteroid injection administered last Wednesday has not alleviated the pain. She continues to wear a boot to immobilize the ankle, although she finds it challenging to keep weight off the affected foot. Ice is used to manage the pain.  The use of the boot has caused discomfort in her other leg, affecting her hips and knees due to the imbalance. She is in the process of obtaining orthotics to help with this issue. The pain and mobility issues have significantly impacted her daily activities and work.  She mentions a history of headaches and a buzzing sensation, which she describes as normal for her. She also experiences numbness in her hand, which she attributes to occipital impingement, causing difficulty with certain movements and affecting her work.  She is currently on Zepbound , which she finds effective, although she is concerned about insurance coverage due to her use of oxygen  at night. She reports that she will be changing from Kevzara  to Actemra , as her ESR and CRP remain elevated.  No new gastrointestinal issues and reports stable breathing without recent infections. She has received her flu shot.  FMLA continuous for 1 month starting 08/22/24  PMH, [facility], social history reviewed and updated  Review of Systems  Constitutional:  Positive for activity change and fatigue.  HENT: Negative.    Eyes: Negative.   Respiratory:  Negative for cough, chest tightness  and shortness of breath.   Cardiovascular:  Negative for chest pain, palpitations and leg swelling.  Gastrointestinal:  Negative for abdominal distention, abdominal pain, constipation, diarrhea, nausea and vomiting.  Musculoskeletal:  Positive for arthralgias, gait problem and myalgias.  Skin: Negative.   Psychiatric/Behavioral: Negative.      Objective:  Physical Exam Constitutional:      Appearance: She is well-developed. She is obese.  HENT:     Head: Normocephalic and atraumatic.  Cardiovascular:     Rate and Rhythm: Normal rate and regular rhythm.  Pulmonary:     Effort: Pulmonary effort is normal. No respiratory distress.     Breath sounds: Normal breath sounds. No wheezing or rales.  Abdominal:     General: Bowel sounds are normal. There is no distension.     Palpations: Abdomen is soft.     Tenderness: There is no abdominal tenderness.  Musculoskeletal:        General: Tenderness present.     Cervical back: Normal range of motion.     Comments: Boot left foot  Skin:    General: Skin is warm and dry.  Neurological:     Mental Status: She is alert and oriented to person, place, and time.     Coordination: Coordination abnormal.     Vitals:   08/22/24 1419  BP: 120/70  Pulse: 62  Temp: 98.5 F (36.9 C)  TempSrc: Oral  SpO2: 97%  Weight: 297 lb (134.7 kg)  Height: 5' 5 (1.651 m)    Assessment & Plan:   "

## 2024-08-23 ENCOUNTER — Other Ambulatory Visit: Payer: Self-pay | Admitting: Podiatry

## 2024-08-23 ENCOUNTER — Encounter: Payer: Self-pay | Admitting: Internal Medicine

## 2024-08-23 MED ORDER — PREDNISONE 5 MG PO TABS: ORAL_TABLET | ORAL | 1 refills | Status: DC

## 2024-08-23 NOTE — Assessment & Plan Note (Signed)
 Needs coverage for zepbound  treatment as she has hypoxemia at night time and requires 2 L o2 in her CPAP. Rx zepbound  7.5 mg weekly to help.

## 2024-08-23 NOTE — Assessment & Plan Note (Signed)
 Taking flecainide  and sounds regular today. EKG not done continue current management.

## 2024-08-23 NOTE — Assessment & Plan Note (Signed)
 Due for lipid panel and will get done with next labs to avoid extra lab draw.

## 2024-08-23 NOTE — Assessment & Plan Note (Signed)
 She does have likely AVN left ankle and will need continuous FMLA 1 month starting 08/22/24 so that she can rest this and hopefully avoid further intervention needed.

## 2024-08-23 NOTE — Assessment & Plan Note (Signed)
 Flu shot yearly. Pneumonia counseled. Shingrix counseled. Tetanus counseled. Colonoscopy up to date. Mammogram up to date, pap smear up to date and dexa will check if done if not likely due. Counseled about sun safety and mole surveillance. Counseled about the dangers of distracted driving. Given 10 year screening recommendations.

## 2024-08-23 NOTE — Assessment & Plan Note (Signed)
 Rx zepbound  7.5 mg weekly and has concurrent OSA which is concurrent with nocturnal hypoxemia and 2L O2 is required with CPAP for treatment.

## 2024-08-23 NOTE — Assessment & Plan Note (Signed)
 Recent labs stable continue monitoring every 3-6 months due to chronic prednisone  treatment.

## 2024-08-24 ENCOUNTER — Other Ambulatory Visit (HOSPITAL_COMMUNITY): Payer: Self-pay

## 2024-08-24 ENCOUNTER — Telehealth: Payer: Self-pay | Admitting: Registered Nurse

## 2024-08-24 ENCOUNTER — Other Ambulatory Visit: Payer: Self-pay

## 2024-08-24 DIAGNOSIS — G8929 Other chronic pain: Secondary | ICD-10-CM

## 2024-08-24 MED ORDER — ZEPBOUND 7.5 MG/0.5ML ~~LOC~~ SOAJ
7.5000 mg | SUBCUTANEOUS | 1 refills | Status: AC
  Filled 2024-08-24 – 2024-10-09 (×4): qty 6, 84d supply, fill #0

## 2024-08-24 NOTE — Telephone Encounter (Signed)
 Reviewed Lumbar Xr-ay.  Ms. Koffler would like to have a MR on lumbar, she wants to see Spine Surgeon  that performs minium invasive procedures on obese patients at [facility]. She is performing HEP and using heat and Ice therapy.

## 2024-08-26 DIAGNOSIS — G4733 Obstructive sleep apnea (adult) (pediatric): Secondary | ICD-10-CM | POA: Diagnosis not present

## 2024-08-26 DIAGNOSIS — J189 Pneumonia, unspecified organism: Secondary | ICD-10-CM | POA: Diagnosis not present

## 2024-08-29 ENCOUNTER — Other Ambulatory Visit (HOSPITAL_COMMUNITY): Payer: Self-pay

## 2024-08-29 DIAGNOSIS — D801 Nonfamilial hypogammaglobulinemia: Secondary | ICD-10-CM | POA: Diagnosis not present

## 2024-08-29 NOTE — Telephone Encounter (Signed)
 I'm with ordering an MRI since she has already failed PT; please get the name of the provider at [facility] so we can send referral. Thanks!

## 2024-08-30 ENCOUNTER — Telehealth: Payer: Self-pay | Admitting: Registered Nurse

## 2024-08-30 DIAGNOSIS — M5416 Radiculopathy, lumbar region: Secondary | ICD-10-CM

## 2024-08-30 NOTE — Telephone Encounter (Signed)
 Dr Emeline in agreement with Lumbar MRI, in the past Monique Clark had two back surgeries. She reports increase intensity and frequency of Lumbar radicular pain.

## 2024-08-31 ENCOUNTER — Telehealth: Payer: Self-pay | Admitting: Internal Medicine

## 2024-08-31 ENCOUNTER — Ambulatory Visit: Admitting: Podiatry

## 2024-08-31 DIAGNOSIS — M722 Plantar fascial fibromatosis: Secondary | ICD-10-CM | POA: Diagnosis not present

## 2024-08-31 DIAGNOSIS — M25572 Pain in left ankle and joints of left foot: Secondary | ICD-10-CM | POA: Diagnosis not present

## 2024-08-31 NOTE — Telephone Encounter (Signed)
 FMLA paperwork received and placed in provider's box up front for completion.

## 2024-08-31 NOTE — Progress Notes (Signed)
 Patient presents for follow-up injection in TN joint left foot.  Got no improvement with the injections still painful still has pain around the mid foot around the TN joint and plantarly along the arch on the left.  She took what sounds a good Medrol  Dosepak several weeks ago.  They changed her medicines for polymyalgia rheumatica recently.  Physical exam:  General appearance: Pleasant, and in no acute distress. AOx3.  Vascular: Pedal pulses: DP 2/4 bilaterally, PT 2/4 bilaterally.  Moderate edema lower legs bilaterally. Capillary fill time immediate bilaterally.  Neurological: Grossly intact bilaterally  Dermatologic:   Skin normal temperature bilaterally.  Skin normal color, tone, and texture bilaterally.   Musculoskeletal: Tenderness along with talonavicular joint left.  Slight soreness over the second tarsometatarsal joint.  Tenderness along the arch of the foot along the plantar fascia.    Diagnosis: 1.  Arthralgia TN joint left. 2.  Plantar fasciitis left.  Plan: -Established office visit for evaluation and management level 3. - I discussed with the continued pain in the foot.  Given the situation is with her medicines would prefer to avoid doing a 12-day taper of prednisone  at this point.  She is on Xarelto  show she cannot take NSAIDs.  Discussed with her that imaging at this point really would not give us  any information in terms of treatment.  If surgery becomes something you are considering then MRI or CT would be beneficial.   -At this point we will try immobilizing it and offloading the foot in a pneumatic cast for 3 weeks and see if she responds to this.  If not we could try injections again.  She is scheduled to see Trish for custom orthotics in a week.  Discussed with some other braces and things we can try also for this problem.  -Dispensed pneumatic cast left  Return 3 weeks follow-up

## 2024-09-01 ENCOUNTER — Encounter: Payer: Self-pay | Admitting: Internal Medicine

## 2024-09-01 DIAGNOSIS — H259 Unspecified age-related cataract: Secondary | ICD-10-CM

## 2024-09-05 ENCOUNTER — Other Ambulatory Visit (HOSPITAL_COMMUNITY): Payer: Self-pay

## 2024-09-05 DIAGNOSIS — G4733 Obstructive sleep apnea (adult) (pediatric): Secondary | ICD-10-CM | POA: Diagnosis not present

## 2024-09-06 ENCOUNTER — Other Ambulatory Visit: Payer: Self-pay | Admitting: Internal Medicine

## 2024-09-07 ENCOUNTER — Ambulatory Visit

## 2024-09-07 DIAGNOSIS — M25572 Pain in left ankle and joints of left foot: Secondary | ICD-10-CM | POA: Diagnosis not present

## 2024-09-07 DIAGNOSIS — M722 Plantar fascial fibromatosis: Secondary | ICD-10-CM | POA: Diagnosis not present

## 2024-09-07 DIAGNOSIS — M25571 Pain in right ankle and joints of right foot: Secondary | ICD-10-CM | POA: Diagnosis not present

## 2024-09-07 NOTE — Progress Notes (Signed)
 Visit:  ORTHOTIC SCAN/ EVALUATION  Patient presented for evaluation/ scan for custom molded foot orthotics.  Patient will benefit from custom foot orthotics to provide total contact to bilateral medial longitudinal arches to help balance and distribute body weight more evenly.  Thus reducing plantar pressure and pain.   Orthotic will encourage forefoot and rearfoot alignment.    Patient was scanned today with OHI scanner.    Orthotics are ordered.  Signature obtained for notification of pricing/ fees for the device.  When the orthotic is ready for pick up, will call to make an appointment for a fitting.

## 2024-09-08 NOTE — Telephone Encounter (Signed)
 Spoke with pt and stated needs form from [employer] filled out for intermittent FMLA. Pt stated that she has been allowed to work from home. May use dx of chronic fatigue with CVID. Pt needs form in place in the event there is a day that she cannot work or if she has to go to a doctor's appt.   Form has been placed in Drs box for signature.

## 2024-09-11 ENCOUNTER — Other Ambulatory Visit: Payer: Self-pay | Admitting: Physical Medicine and Rehabilitation

## 2024-09-12 ENCOUNTER — Other Ambulatory Visit: Payer: Self-pay

## 2024-09-13 ENCOUNTER — Other Ambulatory Visit: Payer: Self-pay

## 2024-09-13 ENCOUNTER — Telehealth: Admitting: Family Medicine

## 2024-09-13 DIAGNOSIS — T3695XA Adverse effect of unspecified systemic antibiotic, initial encounter: Secondary | ICD-10-CM

## 2024-09-13 DIAGNOSIS — B379 Candidiasis, unspecified: Secondary | ICD-10-CM

## 2024-09-13 DIAGNOSIS — L03119 Cellulitis of unspecified part of limb: Secondary | ICD-10-CM

## 2024-09-13 MED ORDER — FLUCONAZOLE 150 MG PO TABS: 150.0000 mg | ORAL_TABLET | ORAL | 0 refills | Status: AC

## 2024-09-13 MED ORDER — SULFAMETHOXAZOLE-TRIMETHOPRIM 800-160 MG PO TABS: 1.0000 | ORAL_TABLET | Freq: Two times a day (BID) | ORAL | 0 refills | Status: AC

## 2024-09-13 NOTE — Addendum Note (Signed)
 Addended by: MOISHE CHIQUITA HERO on: 09/13/2024 10:31 AM   Modules accepted: Orders

## 2024-09-13 NOTE — Progress Notes (Signed)
 Specialty Pharmacy Refill Coordination Note  Monique Clark is a 58 y.o. female contacted today regarding refills of specialty medication(s) Tocilizumab  (Actemra  ACTPen)   Patient requested (Patient-Rptd) Delivery   Delivery date: 09/20/24   Verified address: (Patient-Rptd) [address] [place], [place] [address]   Medication will be filled on: 09/19/24

## 2024-09-13 NOTE — Progress Notes (Signed)
 E Visit for Cellulitis  We are sorry that you are not feeling well. Here is how we plan to help!  Based on what you shared with me it looks like you have cellulitis.  Cellulitis looks like areas of skin redness, swelling, and warmth; it develops as a result of bacteria entering under the skin. Little red spots and/or bleeding can be seen in skin, and tiny surface sacs containing fluid can occur. Fever can be present. Cellulitis is almost always on one side of a body, and the lower limbs are the most common site of involvement.   I have prescribed:  Bactrim  DS 1 tablet by mouth twice a day for 7 days  HOME CARE:  Take your medications as ordered and take all of them, even if the skin irritation appears to be healing.   GET HELP RIGHT AWAY IF:  Symptoms that don't begin to go away within 48 hours. Severe redness persists or worsens If the area turns color, spreads or swells. If it blisters and opens, develops yellow-brown crust or bleeds. You develop a fever or chills. If the pain increases or becomes unbearable.  Are unable to keep fluids and food down.  MAKE SURE YOU   Understand these instructions. Will watch your condition. Will get help right away if you are not doing well or get worse.  Thank you for choosing an e-visit. Your e-visit answers were reviewed by a board certified advanced clinical practitioner to complete your personal care plan. Depending upon the condition, your plan could have included both over the counter or prescription medications. Please review your pharmacy choice. Make sure the pharmacy is open so you can pick up prescription now. If there is a problem, you may contact your provider through [employer] and have the prescription routed to another pharmacy. Your safety is important to us . If you have drug allergies check your prescription carefully.  For the next 24 hours you can use MyChart to ask questions about today's visit, request a non-urgent call  back, or ask for a work or school excuse. You will get an email in the next two days asking about your experience. I hope that your e-visit has been valuable and will speed your recovery.   I have spent 5 minutes in review of e-visit questionnaire, review and updating patient chart, medical decision making and response to patient.   Chiquita CHRISTELLA Barefoot, NP

## 2024-09-14 NOTE — Telephone Encounter (Signed)
 FMLA paperwork has been filled and placed on MA desk to give to provider once reviewed.

## 2024-09-15 NOTE — Telephone Encounter (Signed)
 Paperwork received.

## 2024-09-19 ENCOUNTER — Other Ambulatory Visit (HOSPITAL_COMMUNITY): Payer: Self-pay

## 2024-09-19 ENCOUNTER — Other Ambulatory Visit: Payer: Self-pay

## 2024-09-19 MED ORDER — XARELTO 20 MG PO TABS
20.0000 mg | ORAL_TABLET | Freq: Every day | ORAL | 4 refills | Status: AC
  Filled 2024-09-19: qty 90, 90d supply, fill #0

## 2024-09-20 ENCOUNTER — Other Ambulatory Visit: Payer: Self-pay

## 2024-09-23 ENCOUNTER — Ambulatory Visit
Admission: RE | Admit: 2024-09-23 | Discharge: 2024-09-23 | Disposition: A | Discharge status: Home / Self Care | Source: Ambulatory Visit | Attending: Registered Nurse | Admitting: Registered Nurse

## 2024-09-23 MED ORDER — GADOPICLENOL 0.5 MMOL/ML IV SOLN
10.0000 mL | Freq: Once | INTRAVENOUS | Status: AC | PRN
  Administered 2024-09-23: 10 mL via INTRAVENOUS

## 2024-09-25 DIAGNOSIS — J189 Pneumonia, unspecified organism: Secondary | ICD-10-CM | POA: Diagnosis not present

## 2024-09-25 DIAGNOSIS — G4733 Obstructive sleep apnea (adult) (pediatric): Secondary | ICD-10-CM | POA: Diagnosis not present

## 2024-09-28 ENCOUNTER — Telehealth: Payer: Self-pay | Admitting: Podiatry

## 2024-09-28 NOTE — Telephone Encounter (Signed)
 Orthotics are in [place] office. Patient has an appointment on 09/30/2024.

## 2024-09-29 ENCOUNTER — Other Ambulatory Visit (HOSPITAL_COMMUNITY): Payer: Self-pay

## 2024-09-30 ENCOUNTER — Encounter: Payer: Self-pay | Admitting: Podiatry

## 2024-09-30 ENCOUNTER — Ambulatory Visit (INDEPENDENT_AMBULATORY_CARE_PROVIDER_SITE_OTHER): Admitting: Podiatry

## 2024-09-30 DIAGNOSIS — M25572 Pain in left ankle and joints of left foot: Secondary | ICD-10-CM | POA: Diagnosis not present

## 2024-09-30 MED ORDER — TRIAMCINOLONE ACETONIDE 40 MG/ML IJ SUSP
40.0000 mg | Freq: Once | INTRAMUSCULAR | Status: AC
  Administered 2024-09-30: 40 mg

## 2024-09-30 NOTE — Progress Notes (Signed)
 Presents today for follow-up of plantar fasciitis and pain at the talonavicular joint on the left.  Doing about 50% better utilizing the pneumatic cast.  Patient has polymyalgia rheumatica.   Physical exam:  General appearance: Pleasant, and in no acute distress. AOx3.  Vascular: Pedal pulses: DP 2/4 bilaterally, PT 2/4 bilaterally.  Mild edema lower legs bilaterally. Capillary fill time immediate bilaterally.  Neurological: Light touch intact feet bilaterally.  Normal Achilles reflex bilaterally.  No clonus or spasticity noted.  Negative Tinel sign tarsal tunnel and porta pedis bilaterally  Dermatologic:   Skin normal temperature bilaterally.  Skin normal color, tone, and texture bilaterally.   Musculoskeletal: Tenderness at talonavicular joint today.  Some tenderness along the medial plantar fascia right.   Diagnosis: 1.  Arthralgia talonavicular joint left  Plan: -Improved Duzallo utilizing the pneumatic cast.  Also dispensed her orthotics today.  Will continue the pneumatic cast for another 3 weeks as well as try an injection at the TN joint. -injected 3cc 2:1 mixture 0.5 cc Marcaine : Triamcinolone  40mg /67ml at navicular joint left.    Return 3 weeks follow-up injection TN joint left

## 2024-10-06 ENCOUNTER — Ambulatory Visit: Payer: Self-pay | Admitting: Registered Nurse

## 2024-10-06 NOTE — Telephone Encounter (Signed)
 Dr Emeline:  Monique Clark MRI results are in EPIC,  Can you review and let me know if you have any recommendations.

## 2024-10-07 NOTE — Progress Notes (Signed)
 This is essentially unchanged from her prior MRI, she still has severe spinal stenosis at L4-L5 and moderate stenosis at L3-4, with bilateral neuroforaminal stenosis at both levels.  Recommendations would be to follow-up with her neurosurgical office. I know that she was already on deck for fusion and they were waiting for her to lose weight to qualify; if she is developing worsening signs of spinal stenosis such as loss of bowel or bladder function, increased leg spasms, increased leg weakness or falls, then that becomes more urgent and she will need to talk to her neurosurgical office.  If the symptoms develop suddenly, she needs to go to the ER right away.

## 2024-10-10 ENCOUNTER — Other Ambulatory Visit: Payer: Self-pay

## 2024-10-10 ENCOUNTER — Other Ambulatory Visit (HOSPITAL_COMMUNITY): Payer: Self-pay

## 2024-10-10 NOTE — Telephone Encounter (Signed)
 Monique Clark was called regarding Dr Emeline recommendation of her Lumbar MR. She verbalizes understanding.

## 2024-10-11 ENCOUNTER — Telehealth: Payer: Self-pay

## 2024-10-11 ENCOUNTER — Other Ambulatory Visit (HOSPITAL_COMMUNITY): Payer: Self-pay

## 2024-10-11 NOTE — Telephone Encounter (Signed)
 error

## 2024-10-11 NOTE — Telephone Encounter (Signed)
 New FMLA paperwork received, states EE is taking continuous leave since 08/26/24. Please update paperwork to reflect this leave type.  LM for pt to callback for further info

## 2024-10-12 ENCOUNTER — Other Ambulatory Visit (HOSPITAL_COMMUNITY): Payer: Self-pay

## 2024-10-12 ENCOUNTER — Encounter: Attending: Physical Medicine and Rehabilitation | Admitting: Physical Medicine and Rehabilitation

## 2024-10-12 VITALS — BP 120/76 | HR 55 | Ht 65.0 in | Wt 294.0 lb

## 2024-10-12 DIAGNOSIS — M5442 Lumbago with sciatica, left side: Secondary | ICD-10-CM | POA: Diagnosis not present

## 2024-10-12 DIAGNOSIS — M25511 Pain in right shoulder: Secondary | ICD-10-CM | POA: Diagnosis not present

## 2024-10-12 DIAGNOSIS — M5416 Radiculopathy, lumbar region: Secondary | ICD-10-CM | POA: Diagnosis not present

## 2024-10-12 DIAGNOSIS — M25512 Pain in left shoulder: Secondary | ICD-10-CM | POA: Diagnosis not present

## 2024-10-12 DIAGNOSIS — M25561 Pain in right knee: Secondary | ICD-10-CM | POA: Diagnosis not present

## 2024-10-12 DIAGNOSIS — G894 Chronic pain syndrome: Secondary | ICD-10-CM | POA: Insufficient documentation

## 2024-10-12 DIAGNOSIS — G8929 Other chronic pain: Secondary | ICD-10-CM | POA: Insufficient documentation

## 2024-10-12 DIAGNOSIS — M25562 Pain in left knee: Secondary | ICD-10-CM | POA: Insufficient documentation

## 2024-10-12 MED ORDER — OXYCODONE-ACETAMINOPHEN 5-325 MG PO TABS: 1.0000 | ORAL_TABLET | Freq: Three times a day (TID) | ORAL | 0 refills | Status: AC | PRN

## 2024-10-12 MED ORDER — CYCLOBENZAPRINE HCL 5 MG PO TABS: 5.0000 mg | ORAL_TABLET | Freq: Every evening | ORAL | 5 refills | Status: AC | PRN

## 2024-10-12 MED ORDER — GABAPENTIN 300 MG PO CAPS: 300.0000 mg | ORAL_CAPSULE | Freq: Two times a day (BID) | ORAL | 5 refills | Status: AC

## 2024-10-12 MED ORDER — LIDOCAINE 5 % EX PTCH: 1.0000 | MEDICATED_PATCH | CUTANEOUS | 5 refills | Status: AC

## 2024-10-12 NOTE — Progress Notes (Signed)
 "  Subjective:    Patient ID: Monique Clark, female    DOB: 26-Dec-1965, 59 y.o.   MRN: [id number]  HPI   Monique Clark is a 59 y.o. year old female  who  has a past medical history of Abscess, Arthritis, Asthma, Atrial fibrillation (HCC), Cardiac arrhythmia due to congenital heart disease, Cellulitis (03/2024), Cervical lymphadenopathy, CVID (common variable immunodeficiency) (HCC) (2015), Diverticulosis, Elevated cholesterol, GERD (gastroesophageal reflux disease), Headache, High blood pressure, Non Hodgkin's lymphoma (HCC) (1998), Obesity, Personal history of gestational diabetes, PMR (polymyalgia rheumatica), Pneumonia, PONV (postoperative nausea and vomiting), POTS (postural orthostatic tachycardia syndrome), Pre-diabetes, Sleep apnea, UTI (urinary tract infection), and UTI (urinary tract infection) (03/2024).   They are presenting to PM&R clinic for follow up related to lumbar radiculopathy, L ankle avulsion fx and seronegative RA, PMR related pain.  .  Plan from last visit: Left Lumbar Radiculitis/ Acute Exacerbation of Chronic low back pain: RX: Lumbar X-ray.  Chronic Bilateral  Shoulder Pain: Continue HEP as Tolerated. Continue to Monitor. 11/19 /2025 3. Chronic Bilateral Knee Pain: Continue HEP as Tolerated. Continue to Monitor. 08/17/2024 4. Chronic Left Foot Pain:  Foot Pain: Continue HEP as Tolerated. Continue current medication regimen. Continue to monitor. 08/17/2024 5. Left Ankle Pain: Increase intensity of pain: Xray from 11/19/2023: Probable old avulsion fracture or secondary ossification center adjacent to the lateral malleolus and lateral talus. There is overlying soft tissue swelling, but no definite acute fracture seen. Podiatry referral placed, Monique Clark verbalizes understanding.  Fall at Home: No falls this month. Educated on falls prevention, she verbalizes understanding. Continue to monitor.  Chronic Pain syndrome: Continue Gabapentin . Will speak with Dr Emeline  regarding Monique Clark, she verbalizes understanding. Refilled: I Oxycodone  5mg /325 one tablet three timesa day as needed for pain # 90. Second script sent for the following month. We will continue the opioid monitoring program, this consists of regular clinic visits, examinations, urine drug screen, pill counts as well as use of [place]  Controlled Substance Reporting system. A 12 month History has been reviewed on the   Controlled Substance Reporting System on 08/17/2024 F/U in 2 months     Recent mychart messaging:  This is essentially unchanged from her prior MRI, she still has severe spinal stenosis at L4-L5 and moderate stenosis at L3-4, with bilateral neuroforaminal stenosis at both levels. Recommendations would be to follow-up with her neurosurgical office. I know that she was already on deck for fusion and they were waiting for her to lose weight to qualify; if she is developing worsening signs of spinal stenosis such as loss of bowel or bladder function, increased leg spasms, increased leg weakness or falls, then that becomes more urgent and she will need to talk to her neurosurgical office. If the symptoms develop suddenly, she needs to go to the ER right away.   Interval Hx:  Discussed the use of AI scribe software for clinical note transcription with the patient, who gave verbal consent to proceed.  History of Present Illness             Pain Inventory Average Pain 4 Pain Right Now 4 My pain is dull and aching  In the last 24 hours, has pain interfered with the following? General activity 8 Relation with others 3 Enjoyment of life 5 What TIME of day is your pain at its worst? morning  and evening Sleep (in general) Fair  Pain is worse with: walking, bending, and standing Pain improves with: rest, heat/ice, and medication Relief from Meds:  Fair to good  Family History  Problem Relation Age of Onset   Atrial fibrillation Mother    Sleep apnea Mother     Colon cancer Father    Cancer Father        Colon   Colon cancer Sister    Basal cell carcinoma Brother    Atrial fibrillation Maternal Uncle    Atrial fibrillation Maternal Grandmother    Atrial fibrillation Maternal Grandfather    Heart attack Maternal Grandfather    Ovarian cancer Neg Hx    Stomach cancer Neg Hx    Rectal cancer Neg Hx    Social History   Socioeconomic History   Marital status: Married    Spouse name: Not on file   Number of children: 3   Years of education: Not on file   Highest education level: Doctorate  Occupational History   Occupation: rn  Tobacco Use   Smoking status: Former    Current packs/day: 0.00    Average packs/day: 0.5 packs/day for 20.0 years (10.0 ttl pk-yrs)    Types: Cigarettes    Start date: 09/29/1982    Quit date: 09/29/2002    Years since quitting: 22.0    Passive exposure: Current   Smokeless tobacco: Never  Vaping Use   Vaping status: Never Used  Substance and Sexual Activity   Alcohol use: Yes    Comment: once a month- rarely   Drug use: No   Sexual activity: Yes    Birth control/protection: Surgical, Post-menopausal    Comment: 1st intercourse 49 yo-5 partners  Other Topics Concern   Not on file  Social History Narrative   Pt lives alone    Pt works    Social Drivers of Health   Tobacco Use: Medium Risk (09/30/2024)   Patient History    Smoking Tobacco Use: Former    Smokeless Tobacco Use: Never    Passive Exposure: Current  Physicist, Medical Strain: Low Risk (03/13/2023)   Overall Financial Resource Strain (CARDIA)    Difficulty of Paying Living Expenses: Not very hard  Food Insecurity: No Food Insecurity (03/13/2023)   Hunger Vital Sign    Worried About Running Out of Food in the Last Year: Never true    Ran Out of Food in the Last Year: Never true  Transportation Needs: No Transportation Needs (03/13/2023)   PRAPARE - Administrator, Civil Service (Medical): No    Lack of Transportation  (Non-Medical): No  Physical Activity: Unknown (03/13/2023)   Exercise Vital Sign    Days of Exercise per Week: 0 days    Minutes of Exercise per Session: Not on file  Stress: Stress Concern Present (03/13/2023)   [employer] of Occupational Health - Occupational Stress Questionnaire    Feeling of Stress : To some extent  Social Connections: Moderately Isolated (03/13/2023)   Social Connection and Isolation Panel    Frequency of Communication with Friends and Family: Twice a week    Frequency of Social Gatherings with Friends and Family: Once a week    Attends Religious Services: Never    Database Administrator or Organizations: No    Attends Banker Meetings: Not on file    Marital Status: Married  Depression (PHQ2-9): Medium Risk (08/22/2024)   Depression (PHQ2-9)    PHQ-2 Score: 9  Alcohol Screen: Not on file  Housing: Low Risk (03/13/2023)   Housing    Last Housing Risk Score: 0  Utilities: Not At Risk (08/17/2022)  AHC Utilities    Threatened with loss of utilities: No  Health Literacy: Not on file   Past Surgical History:  Procedure Laterality Date   ABDOMINAL HYSTERECTOMY     APPENDECTOMY     CESAREAN SECTION     CHOLECYSTECTOMY     COLONOSCOPY     HEMATOMA EVACUATION N/A 03/04/2023   Procedure: EVACUATION HEMATOMA SEROMA WITH RECLOSURE LUMBAR INCISION;  Surgeon: Barbarann Oneil BROCKS, MD;  Location: [facility];  Service: Orthopedics;  Laterality: N/A;   JOINT REPLACEMENT Left    hip   JOINT REPLACEMENT Right    Right Hip 2022   LAPAROSCOPIC GASTRIC SLEEVE RESECTION  04/29/2022   left shoulder repair     rotator cuff repair   LUMBAR LAMINECTOMY N/A 09/18/2021   Procedure: RIGHT LUMBAR FOUR-FIVE MICRODISCECTOMY;  Surgeon: Barbarann Oneil BROCKS, MD;  Location: [facility];  Service: Orthopedics;  Laterality: N/A;   LUMBAR LAMINECTOMY/DECOMPRESSION MICRODISCECTOMY N/A 02/27/2023   Procedure: L4-5 LUMBAR DECOMPRESSION, MICRODISCECTOMY;  Surgeon: Barbarann Oneil BROCKS, MD;  Location: [facility];   Service: Orthopedics;  Laterality: N/A;   LUNG REMOVAL, PARTIAL Right 2009   VATS, wedge resection partial lobectomy, done at [facility]   myringostomy Bilateral    Myringotomy   SPLENECTOMY, TOTAL  1998   TONSILLECTOMY AND ADENOIDECTOMY     UPPER GI ENDOSCOPY N/A 04/29/2022   Procedure: UPPER GI ENDOSCOPY;  Surgeon: Stevie, Herlene Righter, MD;  Location: [facility];  Service: General;  Laterality: N/A;   Past Surgical History:  Procedure Laterality Date   ABDOMINAL HYSTERECTOMY     APPENDECTOMY     CESAREAN SECTION     CHOLECYSTECTOMY     COLONOSCOPY     HEMATOMA EVACUATION N/A 03/04/2023   Procedure: EVACUATION HEMATOMA SEROMA WITH RECLOSURE LUMBAR INCISION;  Surgeon: Barbarann Oneil BROCKS, MD;  Location: [facility];  Service: Orthopedics;  Laterality: N/A;   JOINT REPLACEMENT Left    hip   JOINT REPLACEMENT Right    Right Hip 2022   LAPAROSCOPIC GASTRIC SLEEVE RESECTION  04/29/2022   left shoulder repair     rotator cuff repair   LUMBAR LAMINECTOMY N/A 09/18/2021   Procedure: RIGHT LUMBAR FOUR-FIVE MICRODISCECTOMY;  Surgeon: Barbarann Oneil BROCKS, MD;  Location: [facility];  Service: Orthopedics;  Laterality: N/A;   LUMBAR LAMINECTOMY/DECOMPRESSION MICRODISCECTOMY N/A 02/27/2023   Procedure: L4-5 LUMBAR DECOMPRESSION, MICRODISCECTOMY;  Surgeon: Barbarann Oneil BROCKS, MD;  Location: [facility];  Service: Orthopedics;  Laterality: N/A;   LUNG REMOVAL, PARTIAL Right 2009   VATS, wedge resection partial lobectomy, done at [facility]   myringostomy Bilateral    Myringotomy   SPLENECTOMY, TOTAL  1998   TONSILLECTOMY AND ADENOIDECTOMY     UPPER GI ENDOSCOPY N/A 04/29/2022   Procedure: UPPER GI ENDOSCOPY;  Surgeon: Stevie Herlene Righter, MD;  Location: [facility];  Service: General;  Laterality: N/A;   Past Medical History:  Diagnosis Date   Abscess    Arthritis    Asthma    November 2023- hospitalized   Atrial fibrillation [facility])    Cardiac arrhythmia due to congenital heart disease    trace MR 08/15/20 echo  ([place])- pt denies congenital heart disease   Cellulitis 03/2024   Cervical lymphadenopathy    CVID (common variable immunodeficiency) (HCC) 2015   takes Hizentra    Diverticulosis    Elevated cholesterol    GERD (gastroesophageal reflux disease)    Headache    High blood pressure    Non Hodgkin's lymphoma (HCC) 1998   Obesity  Personal history of gestational diabetes    PMR (polymyalgia rheumatica)    takes Kevzara    Pneumonia    PONV (postoperative nausea and vomiting)    POTS (postural orthostatic tachycardia syndrome)    Pre-diabetes    Sleep apnea    CPAP with 2L O2   UTI (urinary tract infection)    UTI (urinary tract infection) 03/2024   BP 120/76 (Patient Position: Sitting, Cuff Size: Large)   Pulse (!) 55   Ht 5' 5 (1.651 m)   Wt 294 lb (133.4 kg)   SpO2 96%   BMI 48.92 kg/m   Opioid Risk Score:   Fall Risk Score:  `1  Depression screen [facility] 2/9     08/22/2024    2:32 PM 08/17/2024   10:12 AM 06/21/2024   10:32 AM 04/22/2024    9:20 AM 03/28/2024    9:32 AM 03/16/2023    1:10 PM 09/18/2022   10:45 AM  Depression screen PHQ 2/9  Decreased Interest 2 0 1 1 0 0 0  Down, Depressed, Hopeless 1 0 0 1 1 0 0  PHQ - 2 Score 3 0 1 2 1  0 0  Altered sleeping 3  2  0    Tired, decreased energy 3  2  3     Change in appetite 0  0  1    Feeling bad or failure about yourself  0  0  0    Trouble concentrating 0  0  0    Moving slowly or fidgety/restless 0  0  0    Suicidal thoughts 0  0  0    PHQ-9 Score 9  5   5      Difficult doing work/chores Very difficult  Very difficult  Very difficult       Data saved with a previous flowsheet row definition      Review of Systems  Musculoskeletal:  Positive for back pain.       Low back pain, right hip pain, left foot pain  All other systems reviewed and are negative.      Objective:   Physical Exam  PE: Constitution: Appropriate appearance for age. No apparent distress  +Obese Resp: No respiratory  distress. No accessory muscle usage. on RA Cardio: Well perfused appearance. No peripheral edema. Abdomen: Nondistended. Nontender.   Psych: Appropriate mood and affect. Neuro: AAOx4. No apparent cognitive deficits    Neurologic Exam:   DTRs: Reflexes were 2+ in bilateral achilles, patella, biceps, BR and triceps.   Sensory exam: revealed normal sensation in all dermatomal regions in bilateral upper extremities and bilateral lower extremities - tinel's R wrist, elbow Motor exam: strength 5/5 throughout bilateral upper extremities, bilateral lower extremities, c/b by L ankle boot.  Coordination: Fine motor coordination was normal.   Gait: Antalgic, cane in R hand,  offload of L foot in boot; stiff and forward bending.    Back Exam:   Inspection: Pelvis was  even.  Lumbar lordotic curvature was increased.  There was  no evidence of scoliosis.   Palpation: Palpatory exam revealed ttp at the bilateral lumbar paraspinals, PSIS, and shoulders; not SI joints, hips. There was  no evidence of spasm.    ROM revealed restricted ROM in proximal shoudlers . Special/provocative testing:    SLR: -   Slump test: -    Facet loading: + (very non-specific)   TTP at paraspinals: +(sensitive for facet pain...if no ttp then likely not facet pain)   -Paint radiates  into Bilateral hips and buttocks, L>R, with back extension       Assessment & Plan:   Monique Clark is a 59 y.o. year old female  who  has a past medical history of Abscess, Arthritis, Asthma, Atrial fibrillation (HCC), Cardiac arrhythmia due to congenital heart disease, Cellulitis (03/2024), Cervical lymphadenopathy, CVID (common variable immunodeficiency) (HCC) (2015), Diverticulosis, Elevated cholesterol, GERD (gastroesophageal reflux disease), Headache, High blood pressure, Non Hodgkin's lymphoma (HCC) (1998), Obesity, Personal history of gestational diabetes, PMR (polymyalgia rheumatica), Pneumonia, PONV (postoperative nausea and  vomiting), POTS (postural orthostatic tachycardia syndrome), Pre-diabetes, Sleep apnea, UTI (urinary tract infection), and UTI (urinary tract infection) (03/2024).   They are presenting to PM&R clinic for lumbar radiculopathy, L ankle avulsion fx and seronegative RA, PMR related pain.   Assessment and Plan    Chronic low back pain with lumbar radiculopathy Chronic, refractory low back pain with lumbar radiculopathy, primarily isolated to the back with occasional radiation to the hip and buttock. MRI findings unchanged. Failed multiple interventions. Current pharmacologic regimen provides improved pain control. Obesity limits surgical options. Evaluated for minimally invasive spine surgery at [facility]. Increased activity recommended for weight loss and surgical eligibility. - Refilled Percocet for three months. - Continued Flexeril , Cymbalta , gabapentin , Voltaren  gel, and lidocaine  patches. - Pending apt for minimally invasive spine surgery evaluation at [facility]. - Ordered referral to aqua therapy at [facility] to encourage increased activity. - Advised follow up with Fidela for ongoing management in 3 months  Chronic bilateral shoulder and knee pain Chronic bilateral shoulder and knee pain, likely multifactorial due to underlying arthritis and obesity. Managed with topical therapy and home exercise. - Continued Voltaren  gel for symptomatic relief. - Encouraged participation in aqua therapy to reduce joint stress and improve mobility.  Avascular necrosis of the hip, now suspected L foot Avascular necrosis of the hip, managed with orthotics and limited weight-bearing. Currently in a boot and awaiting further orthopedic evaluation. -   follow up with orthopedic provider next week for further evaluation.  Obesity Obesity is a significant comorbidity impacting surgical eligibility and contributing to joint pain. On Zepbound  and practicing calorie restriction, with gradual weight loss. Physical  activity limited by pain. - Continued Zepbound  for weight management. - Encouraged use of food tracking app (e.g., My Fitness Pal) to monitor dietary intake. - Recommended increased activity through aqua therapy. - Provided anticipatory guidance regarding the impact of weight loss on surgical eligibility and joint pain.  Cubital tunnel syndrome Cubital tunnel syndrome with numbness and tingling in the ulnar distribution on R. No bracing used to date. - Recommended use of nighttime elbow brace to maintain elbow extension and reduce ulnar nerve impingement. - Provided education regarding expected symptom improvement over six weeks with brace use.          "

## 2024-10-12 NOTE — Patient Instructions (Addendum)
" °  VISIT SUMMARY: During your visit, we discussed your chronic low back pain, lumbar radiculopathy, avascular necrosis of the hip, chronic bilateral shoulder and knee pain, obesity, and cubital tunnel syndrome. We reviewed your current treatments and made adjustments to your pain management plan. We also discussed the importance of weight loss and increased activity for improving your condition and surgical eligibility.  YOUR PLAN: CHRONIC LOW BACK PAIN WITH LUMBAR RADICULOPATHY: You have chronic low back pain with lumbar radiculopathy that radiates to your hip and buttock. Your MRI findings are unchanged, and you have not found relief from multiple interventions. -Refilled Percocet for three months. -Continue taking Flexeril , Cymbalta , gabapentin , Voltaren  gel, and using lidocaine  patches. -Has been referred to minimally invasive spine surgery evaluation at [facility], pending appt. -Ordered referral to aqua therapy at [facility] to encourage increased activity. -Follow up with Fidela for ongoing management.  CHRONIC BILATERAL SHOULDER AND KNEE PAIN: You have chronic pain in both shoulders and knees, likely due to arthritis and obesity. -Continue using Voltaren  gel for relief. -Participate in aqua therapy to reduce joint stress and improve mobility.  AVASCULAR NECROSIS OF THE HIP, NOW SUSPECTED L FOOT: You have avascular necrosis of the hip, which limits your mobility. You are currently using orthotics and a boot. -Follow up with provdider next week for further evaluation.  OBESITY: Your obesity is affecting your surgical eligibility and contributing to joint pain. You are on Zepbound  and practicing calorie restriction with gradual weight loss. -Continue taking Zepbound  for weight management. -Use a food tracking app like My Fitness Pal to monitor your dietary intake. -Increase activity through aqua therapy. -Weight loss will improve your surgical eligibility and reduce joint pain.  CUBITAL  TUNNEL SYNDROME: You have cubital tunnel syndrome with numbness and tingling in your fourth and fifth fingers. -Use a nighttime elbow brace to keep your elbow extended and reduce ulnar nerve impingement. -Symptoms should improve over six weeks with brace use.     Contains text generated by [employer].   "

## 2024-10-14 ENCOUNTER — Other Ambulatory Visit (HOSPITAL_COMMUNITY): Payer: Self-pay

## 2024-10-14 ENCOUNTER — Other Ambulatory Visit: Payer: Self-pay

## 2024-10-14 ENCOUNTER — Other Ambulatory Visit: Payer: Self-pay | Admitting: Pharmacy Technician

## 2024-10-14 NOTE — Progress Notes (Signed)
 Specialty Pharmacy Refill Coordination Note  Monique Clark is a 59 y.o. female contacted today regarding refills of specialty medication(s) Tocilizumab  (Actemra  ACTPen)   Patient requested Delivery   Delivery date: 10/25/24   Verified address: [address] [place], [place] [address]   Medication will be filled on: 10/24/24

## 2024-10-18 ENCOUNTER — Other Ambulatory Visit (HOSPITAL_COMMUNITY): Payer: Self-pay

## 2024-10-18 ENCOUNTER — Other Ambulatory Visit: Payer: Self-pay

## 2024-10-19 ENCOUNTER — Other Ambulatory Visit: Payer: Self-pay

## 2024-10-19 NOTE — Progress Notes (Signed)
 Specialty Pharmacy Refill Coordination Note  Monique Clark is a 59 y.o. female contacted today regarding refills of specialty medication(s) Tocilizumab  (Actemra  ACTPen)   Patient requested Delivery   Delivery date: 10/27/24   Verified address: [address] [place], [place] [address]   Medication will be filled on: 10/26/24

## 2024-10-20 NOTE — Telephone Encounter (Signed)
 This has been addressed.

## 2024-10-24 ENCOUNTER — Ambulatory Visit: Admitting: Podiatry

## 2024-10-26 ENCOUNTER — Other Ambulatory Visit: Payer: Self-pay

## 2024-10-31 ENCOUNTER — Ambulatory Visit: Admitting: Podiatry

## 2024-11-01 NOTE — Assessment & Plan Note (Signed)
 Monique Clark

## 2024-11-01 NOTE — Assessment & Plan Note (Signed)
 SABRA

## 2024-11-09 ENCOUNTER — Ambulatory Visit: Admitting: Internal Medicine

## 2024-11-09 DIAGNOSIS — M353 Polymyalgia rheumatica: Secondary | ICD-10-CM

## 2024-11-09 DIAGNOSIS — H259 Unspecified age-related cataract: Secondary | ICD-10-CM

## 2024-11-09 DIAGNOSIS — Z79899 Other long term (current) drug therapy: Secondary | ICD-10-CM

## 2024-11-15 ENCOUNTER — Ambulatory Visit

## 2025-01-10 ENCOUNTER — Encounter: Admitting: Registered Nurse

## 2025-05-16 ENCOUNTER — Ambulatory Visit: Admitting: Family Medicine
